# Patient Record
Sex: Male | Born: 1955 | Race: White | Hispanic: No | Marital: Single | Smoking: Current every day smoker
Health system: Southern US, Community
[De-identification: ages and names within clinical notes are randomized; demographics above are authoritative.]

## PROBLEM LIST (undated history)

## (undated) DIAGNOSIS — I10 Essential (primary) hypertension: Secondary | ICD-10-CM

## (undated) DIAGNOSIS — N289 Disorder of kidney and ureter, unspecified: Secondary | ICD-10-CM

## (undated) DIAGNOSIS — N183 Chronic kidney disease, stage 3 unspecified: Secondary | ICD-10-CM

## (undated) DIAGNOSIS — I509 Heart failure, unspecified: Secondary | ICD-10-CM

## (undated) HISTORY — PX: TOE AMPUTATION: SHX809

---

## 2018-08-02 ENCOUNTER — Other Ambulatory Visit: Payer: Self-pay

## 2018-08-02 ENCOUNTER — Inpatient Hospital Stay
Admission: EM | Admit: 2018-08-02 | Discharge: 2018-08-09 | DRG: 464 | Disposition: A | Discharge status: Skilled Nursing Facility | Payer: Medicare HMO | Attending: Internal Medicine | Admitting: Internal Medicine

## 2018-08-02 ENCOUNTER — Emergency Department: Payer: Medicare HMO

## 2018-08-02 ENCOUNTER — Inpatient Hospital Stay: Payer: Medicare HMO

## 2018-08-02 ENCOUNTER — Encounter: Payer: Self-pay | Admitting: Emergency Medicine

## 2018-08-02 DIAGNOSIS — I130 Hypertensive heart and chronic kidney disease with heart failure and stage 1 through stage 4 chronic kidney disease, or unspecified chronic kidney disease: Secondary | ICD-10-CM | POA: Diagnosis present

## 2018-08-02 DIAGNOSIS — I482 Chronic atrial fibrillation, unspecified: Secondary | ICD-10-CM | POA: Diagnosis present

## 2018-08-02 DIAGNOSIS — I5042 Chronic combined systolic (congestive) and diastolic (congestive) heart failure: Secondary | ICD-10-CM | POA: Diagnosis present

## 2018-08-02 DIAGNOSIS — L97529 Non-pressure chronic ulcer of other part of left foot with unspecified severity: Secondary | ICD-10-CM | POA: Diagnosis present

## 2018-08-02 DIAGNOSIS — R339 Retention of urine, unspecified: Secondary | ICD-10-CM | POA: Diagnosis not present

## 2018-08-02 DIAGNOSIS — M869 Osteomyelitis, unspecified: Principal | ICD-10-CM | POA: Diagnosis present

## 2018-08-02 DIAGNOSIS — I959 Hypotension, unspecified: Secondary | ICD-10-CM | POA: Diagnosis not present

## 2018-08-02 DIAGNOSIS — Z1159 Encounter for screening for other viral diseases: Secondary | ICD-10-CM | POA: Diagnosis not present

## 2018-08-02 DIAGNOSIS — R591 Generalized enlarged lymph nodes: Secondary | ICD-10-CM | POA: Diagnosis present

## 2018-08-02 DIAGNOSIS — F17210 Nicotine dependence, cigarettes, uncomplicated: Secondary | ICD-10-CM | POA: Diagnosis present

## 2018-08-02 DIAGNOSIS — N179 Acute kidney failure, unspecified: Secondary | ICD-10-CM | POA: Diagnosis present

## 2018-08-02 DIAGNOSIS — N183 Chronic kidney disease, stage 3 (moderate): Secondary | ICD-10-CM | POA: Diagnosis present

## 2018-08-02 DIAGNOSIS — Z79899 Other long term (current) drug therapy: Secondary | ICD-10-CM

## 2018-08-02 DIAGNOSIS — G629 Polyneuropathy, unspecified: Secondary | ICD-10-CM | POA: Diagnosis present

## 2018-08-02 DIAGNOSIS — Z89412 Acquired absence of left great toe: Secondary | ICD-10-CM | POA: Diagnosis not present

## 2018-08-02 DIAGNOSIS — L089 Local infection of the skin and subcutaneous tissue, unspecified: Secondary | ICD-10-CM | POA: Diagnosis present

## 2018-08-02 HISTORY — DX: Essential (primary) hypertension: I10

## 2018-08-02 HISTORY — DX: Heart failure, unspecified: I50.9

## 2018-08-02 LAB — BASIC METABOLIC PANEL
Anion gap: 7 (ref 5–15)
BUN: 23 mg/dL (ref 8–23)
CO2: 22 mmol/L (ref 22–32)
Calcium: 10.4 mg/dL — ABNORMAL HIGH (ref 8.9–10.3)
Chloride: 106 mmol/L (ref 98–111)
Creatinine, Ser: 1.68 mg/dL — ABNORMAL HIGH (ref 0.61–1.24)
GFR calc Af Amer: 50 mL/min — ABNORMAL LOW (ref >60)
GFR calc non Af Amer: 43 mL/min — ABNORMAL LOW (ref >60)
Glucose, Bld: 108 mg/dL — ABNORMAL HIGH (ref 70–99)
Potassium: 4.6 mmol/L (ref 3.5–5.1)
Sodium: 135 mmol/L (ref 135–145)

## 2018-08-02 LAB — SEDIMENTATION RATE: Sed Rate: 36 mm/hr — ABNORMAL HIGH (ref 0–20)

## 2018-08-02 LAB — CBC WITH DIFFERENTIAL/PLATELET
Abs Immature Granulocytes: 0.04 10*3/uL (ref 0.00–0.07)
Basophils Absolute: 0.1 10*3/uL (ref 0.0–0.1)
Basophils Relative: 1 %
Eosinophils Absolute: 0.3 10*3/uL (ref 0.0–0.5)
Eosinophils Relative: 2 %
HCT: 42.5 % (ref 39.0–52.0)
Hemoglobin: 13.3 g/dL (ref 13.0–17.0)
Immature Granulocytes: 0 %
Lymphocytes Relative: 12 %
Lymphs Abs: 1.4 10*3/uL (ref 0.7–4.0)
MCH: 30 pg (ref 26.0–34.0)
MCHC: 31.3 g/dL (ref 30.0–36.0)
MCV: 95.7 fL (ref 80.0–100.0)
Monocytes Absolute: 1.4 10*3/uL — ABNORMAL HIGH (ref 0.1–1.0)
Monocytes Relative: 12 %
Neutro Abs: 8.7 10*3/uL — ABNORMAL HIGH (ref 1.7–7.7)
Neutrophils Relative %: 73 %
Platelets: 259 10*3/uL (ref 150–400)
RBC: 4.44 MIL/uL (ref 4.22–5.81)
RDW: 13.1 % (ref 11.5–15.5)
WBC: 12 10*3/uL — ABNORMAL HIGH (ref 4.0–10.5)
nRBC: 0 % (ref 0.0–0.2)

## 2018-08-02 LAB — C-REACTIVE PROTEIN: CRP: 6.4 mg/dL — ABNORMAL HIGH (ref <1.0)

## 2018-08-02 LAB — LACTIC ACID, PLASMA: Lactic Acid, Venous: 0.8 mmol/L (ref 0.5–1.9)

## 2018-08-02 MED ORDER — ACETAMINOPHEN 325 MG PO TABS: 650.0000 mg | ORAL_TABLET | Freq: Four times a day (QID) | ORAL | Status: DC | PRN

## 2018-08-02 MED ORDER — DIGOXIN 250 MCG PO TABS: 0.2500 mg | ORAL_TABLET | Freq: Every day | ORAL | Status: DC

## 2018-08-02 MED ORDER — POLYETHYLENE GLYCOL 3350 17 G PO PACK: 17.0000 g | PACK | Freq: Every day | ORAL | Status: DC | PRN

## 2018-08-02 MED ORDER — SODIUM CHLORIDE 0.9% FLUSH
10.0000 mL | Freq: Two times a day (BID) | INTRAVENOUS | Status: DC
  Administered 2018-08-02 – 2018-08-09 (×8): 10 mL

## 2018-08-02 MED ORDER — ACETAMINOPHEN 650 MG RE SUPP: 650.0000 mg | Freq: Four times a day (QID) | RECTAL | Status: DC | PRN

## 2018-08-02 MED ORDER — FUROSEMIDE 40 MG PO TABS
40.0000 mg | ORAL_TABLET | Freq: Every day | ORAL | Status: DC
  Administered 2018-08-03 – 2018-08-09 (×6): 40 mg via ORAL
  Filled 2018-08-02 (×6): qty 1

## 2018-08-02 MED ORDER — GADOBUTROL 1 MMOL/ML IV SOLN: 8.0000 mL | Freq: Once | INTRAVENOUS | Status: DC | PRN

## 2018-08-02 MED ORDER — VANCOMYCIN HCL 10 G IV SOLR
1500.0000 mg | INTRAVENOUS | Status: DC
  Administered 2018-08-03: 05:00:00 1500 mg via INTRAVENOUS
  Filled 2018-08-02: qty 1500

## 2018-08-02 MED ORDER — ASPIRIN 81 MG PO CHEW
81.0000 mg | CHEWABLE_TABLET | Freq: Every day | ORAL | Status: DC
  Administered 2018-08-02 – 2018-08-09 (×7): 81 mg via ORAL
  Filled 2018-08-02 (×7): qty 1

## 2018-08-02 MED ORDER — ONDANSETRON HCL 4 MG/2ML IJ SOLN: 4.0000 mg | Freq: Four times a day (QID) | INTRAMUSCULAR | Status: DC | PRN

## 2018-08-02 MED ORDER — ONDANSETRON HCL 4 MG PO TABS: 4.0000 mg | ORAL_TABLET | Freq: Four times a day (QID) | ORAL | Status: DC | PRN

## 2018-08-02 MED ORDER — VANCOMYCIN HCL IN DEXTROSE 1-5 GM/200ML-% IV SOLN
1000.0000 mg | Freq: Once | INTRAVENOUS | Status: AC
  Administered 2018-08-02: 1000 mg via INTRAVENOUS
  Filled 2018-08-02: qty 200

## 2018-08-02 MED ORDER — GADOBUTROL 1 MMOL/ML IV SOLN
7.5000 mL | Freq: Once | INTRAVENOUS | Status: AC | PRN
  Administered 2018-08-02: 19:00:00 7.5 mL via INTRAVENOUS

## 2018-08-02 MED ORDER — METOPROLOL SUCCINATE ER 50 MG PO TB24
100.0000 mg | ORAL_TABLET | Freq: Every day | ORAL | Status: DC
  Administered 2018-08-02 – 2018-08-04 (×3): 100 mg via ORAL
  Filled 2018-08-02 (×3): qty 2

## 2018-08-02 MED ORDER — SODIUM CHLORIDE 0.9 % IV SOLN
2.0000 g | Freq: Two times a day (BID) | INTRAVENOUS | Status: DC
  Administered 2018-08-03: 04:00:00 2 g via INTRAVENOUS
  Filled 2018-08-02 (×2): qty 2

## 2018-08-02 MED ORDER — NICOTINE 14 MG/24HR TD PT24
14.0000 mg | MEDICATED_PATCH | Freq: Every day | TRANSDERMAL | Status: DC
  Filled 2018-08-02 (×4): qty 1

## 2018-08-02 MED ORDER — LISINOPRIL 5 MG PO TABS
2.5000 mg | ORAL_TABLET | Freq: Every day | ORAL | Status: DC
  Administered 2018-08-03 – 2018-08-09 (×6): 2.5 mg via ORAL
  Filled 2018-08-02 (×6): qty 1

## 2018-08-02 MED ORDER — SODIUM CHLORIDE 0.9 % IV SOLN
2.0000 g | Freq: Once | INTRAVENOUS | Status: AC
  Administered 2018-08-02: 2 g via INTRAVENOUS
  Filled 2018-08-02: qty 2

## 2018-08-02 MED ORDER — SODIUM CHLORIDE 0.9% FLUSH: 10.0000 mL | INTRAVENOUS | Status: DC | PRN

## 2018-08-02 MED ORDER — DIGOXIN 125 MCG PO TABS
0.1250 mg | ORAL_TABLET | Freq: Every day | ORAL | Status: DC
  Administered 2018-08-02 – 2018-08-04 (×3): 0.125 mg via ORAL
  Filled 2018-08-02 (×8): qty 1

## 2018-08-02 MED ORDER — VANCOMYCIN HCL IN DEXTROSE 1-5 GM/200ML-% IV SOLN
1000.0000 mg | Freq: Once | INTRAVENOUS | Status: AC
  Administered 2018-08-02: 16:00:00 1000 mg via INTRAVENOUS
  Filled 2018-08-02: qty 200

## 2018-08-02 NOTE — Consult Note (Signed)
Reason for Consult: Infected bone left foot Referring Physician: Yomar Clark is an 63 y.o. male.  HPI: This is a 63 year old male just recently moved to this area from [place] to a group home.  States he had to move out of his old home due to being infested with bugs.  States he has had some chronic problems with sores on his left foot over the last couple of years with amputation of the big toe last year.  Has had some increasing redness and swelling and drainage from a chronic sore on his left foot and was referred to the emergency department earlier today.  Admitted for definitive treatment.  Past Medical History:  Diagnosis Date  . CHF (congestive heart failure) ([place])   . Hypertension     Past Surgical History:  Procedure Laterality Date  . TOE AMPUTATION      History reviewed. No pertinent family history.  Social History:  reports that he has been smoking. He has never used smokeless tobacco. He reports that he does not drink alcohol or use drugs.  Allergies: No Known Allergies  Medications:  Scheduled: . aspirin  81 mg Oral Daily  . digoxin  0.25 mg Oral Daily  . furosemide  40 mg Oral Daily  . lisinopril  2.5 mg Oral Daily  . metoprolol succinate  100 mg Oral Daily  . [START ON 08/03/2018] nicotine  14 mg Transdermal Daily  . sodium chloride flush  10-40 mL Intracatheter Q12H    Results for orders placed or performed during the hospital encounter of 08/02/18 (from the past 48 hour(s))  Basic metabolic panel     Status: Abnormal   Collection Time: 08/02/18  9:31 AM  Result Value Ref Range   Sodium 135 135 - 145 mmol/L   Potassium 4.6 3.5 - 5.1 mmol/L   Chloride 106 98 - 111 mmol/L   CO2 22 22 - 32 mmol/L   Glucose, Bld 108 (H) 70 - 99 mg/dL   BUN 23 8 - 23 mg/dL   Creatinine, Ser 1.68 (H) 0.61 - 1.24 mg/dL   Calcium 10.4 (H) 8.9 - 10.3 mg/dL   GFR calc non Af Amer 43 (L) >60 mL/min   GFR calc Af Amer 50 (L) >60 mL/min   Anion gap 7 5 - 15    Comment:  Performed at [facility], [address] [place], [place] [address]  CBC with Differential     Status: Abnormal   Collection Time: 08/02/18  9:31 AM  Result Value Ref Range   WBC 12.0 (H) 4.0 - 10.5 K/uL    Comment: WHITE COUNT CONFIRMED ON SMEAR   RBC 4.44 4.22 - 5.81 MIL/uL   Hemoglobin 13.3 13.0 - 17.0 g/dL   HCT 42.5 39.0 - 52.0 %   MCV 95.7 80.0 - 100.0 fL   MCH 30.0 26.0 - 34.0 pg   MCHC 31.3 30.0 - 36.0 g/dL   RDW 13.1 11.5 - 15.5 %   Platelets 259 150 - 400 K/uL    Comment: PLATELET CLUMPS NOTED ON SMEAR, UNABLE TO ESTIMATE   nRBC 0.0 0.0 - 0.2 %   Neutrophils Relative % 73 %   Neutro Abs 8.7 (H) 1.7 - 7.7 K/uL   Lymphocytes Relative 12 %   Lymphs Abs 1.4 0.7 - 4.0 K/uL   Monocytes Relative 12 %   Monocytes Absolute 1.4 (H) 0.1 - 1.0 K/uL   Eosinophils Relative 2 %   Eosinophils Absolute 0.3 0.0 - 0.5  K/uL   Basophils Relative 1 %   Basophils Absolute 0.1 0.0 - 0.1 K/uL   Immature Granulocytes 0 %   Abs Immature Granulocytes 0.04 0.00 - 0.07 K/uL    Comment: Performed at [facility], [address] [place], [place] [address]  Lactic acid, plasma     Status: None   Collection Time: 08/02/18  9:31 AM  Result Value Ref Range   Lactic Acid, Venous 0.8 0.5 - 1.9 mmol/L    Comment: Performed at [facility], [address] [place], [place] [address]  Sedimentation rate     Status: Abnormal   Collection Time: 08/02/18  9:31 AM  Result Value Ref Range   Sed Rate 36 (H) 0 - 20 mm/hr    Comment: Performed at [facility], [address] [place], [place] [address]    Dg Foot 2 Views Left  Result Date: 08/02/2018 CLINICAL DATA:  Left toe pain.  Amputation 4 weeks ago. EXAM: LEFT FOOT - 2 VIEW COMPARISON:  None. FINDINGS: To the first metatarsal was amputated. There is dislocation of the second MTP joint laterally. There is diffuse cortical thickening and periosteal reaction along the medial aspect of the second metatarsal with  erosive changes distally. The third MTP joint demonstrates subluxation laterally as well. There is callus formation about the third metatarsal compatible with remote fracture. Diffuse soft tissue swelling is present about the ankle and foot. IMPRESSION: 1. Lateral dislocation of the second MTP joint. 2. Diffuse periosteal reaction along the medial aspect of the second metatarsal with erosive changes distally. This is concerning for osteomyelitis. 3. Extensive soft tissue swelling.  Infection is not excluded. 4. Remote fracture of the third metatarsal. Electronically Signed   By: San Morelle M.D.   On: 08/02/2018 09:56    Review of Systems  Constitutional: Negative for chills and fever.  HENT: Negative for hearing loss and sinus pain.   Eyes: Negative for blurred vision and double vision.  Respiratory: Negative for cough and shortness of breath.   Cardiovascular: Negative for chest pain and palpitations.  Gastrointestinal: Negative for nausea and vomiting.  Genitourinary: Negative for dysuria and urgency.  Musculoskeletal: Negative for back pain and myalgias.  Skin:       Patient relates some recently increasing drainage from a chronic wound on his left foot.  Some increased swelling and redness in the left foot and leg  Neurological:       Patient relates numbness in both of his lower extremities.  Endo/Heme/Allergies: Does not bruise/bleed easily.  Psychiatric/Behavioral: Negative for depression and suicidal ideas.   Blood pressure 120/78, pulse (!) 103, temperature 98.8 F (37.1 C), temperature source Oral, resp. rate 16, height 6\' 1"  (1.854 m), weight 84.4 kg, SpO2 99 %. Physical Exam  Cardiovascular:  DP and PT pulses are fully palpable bilateral.  Musculoskeletal:     Comments: Previous amputation of the left great toe with ray resection.  Otherwise appears to be relatively normal range of motion in the pedal joints.  Some dorsal contracture of the digits on the left foot.   Neurological:  Complete loss of protective threshold with a monofilament wire bilaterally in feet and toes.  Proprioception impaired as well.  Skin:  The skin is warm dry and supple.  Some significant edema in the left lower extremity with some focal erythema in the forefoot medial.  A full-thickness ulceration with exposed bone is present beneath the second metatarsal measuring approximately 2.2 cm x 1.5 cm.  All of the remaining toenails are thick, dystrophic, discolored, brittle with some subungual debris.    Assessment/Plan: Assessment: Osteomyelitis left second metatarsal.  Neuropathy.  Plan: Discussed with the patient that the bone does appear to be infected in the left foot.  Discussed that we will need to remove the infected portion of bone.  This also may require amputating the second toe but I will review his x-rays again as for incision placement we may be able to leave the second toe intact as it is previously dislocated and hopefully not really infected.  Discussed risks and complications of the procedure and anesthesia including inability to heal due to the infection which may require further surgery.  Questions invited and answered.  Obtain a consent for debridement infected bone left foot with possible second toe amputation.  N.p.o. after midnight.  We will plan for surgery tomorrow around lunchtime.  Durward Fortes 08/02/2018, 5:36 PM

## 2018-08-02 NOTE — ED Notes (Addendum)
ED TO INPATIENT HANDOFF REPORT  ED Nurse Name and Phone #:  Gershon Mussel RN    [phone]  S Name/Age/Gender Ruben Clark 63 y.o. male Room/Bed: ED16A/ED16A  Code Status   Code Status: Not on file  Home/SNF/Other [facility] Patient oriented to: self, place, time and situation Is this baseline? Yes   Triage Complete: Triage complete  Chief Complaint L foot pain   Triage Note Pt from [facility] via EMS with c/o LFT toe pain. PT had amputation aprox 4wks ago. Pt also states he has been out of his meds. MD at bedside.    Allergies No Known Allergies  Level of Care/Admitting Diagnosis ED Disposition    ED Disposition Condition [facility] Area: [place] [[id number]]  Level of Care: Med-Surg [16]  Covid Evaluation: N/A  Diagnosis: Left foot infection [[id number]]  Admitting Physician: Hyman Bible DODD [[id number]]  Attending Physician: Hyman Bible DODD [[id number]]  Estimated length of stay: past midnight tomorrow  Certification:: I certify this patient will need inpatient services for at least 2 midnights  PT Class (Do Not Modify): Inpatient [101]  PT Acc Code (Do Not Modify): Private [1]       B Medical/Surgery History Past Medical History:  Diagnosis Date  . CHF (congestive heart failure) ([place])   . Hypertension    Past Surgical History:  Procedure Laterality Date  . TOE AMPUTATION       A IV Location/Drains/Wounds Patient Lines/Drains/Airways Status   Active Line/Drains/Airways    Name:   Placement date:   Placement time:   Site:   Days:   Midline Single Lumen 08/02/18 Midline Left Brachial 8 cm 0 cm   08/02/18    1254    Brachial   less than 1          Intake/Output Last 24 hours No intake or output data in the 24 hours ending 08/02/18 1357  Labs/Imaging Results for orders placed or performed during the hospital encounter of 08/02/18 (from the past 48 hour(s))  Basic metabolic panel     Status: Abnormal   Collection Time: 08/02/18  9:31 AM  Result Value Ref Range   Sodium 135 135 - 145 mmol/L   Potassium 4.6 3.5 - 5.1 mmol/L   Chloride 106 98 - 111 mmol/L   CO2 22 22 - 32 mmol/L   Glucose, Bld 108 (H) 70 - 99 mg/dL   BUN 23 8 - 23 mg/dL   Creatinine, Ser 1.68 (H) 0.61 - 1.24 mg/dL   Calcium 10.4 (H) 8.9 - 10.3 mg/dL   GFR calc non Af Amer 43 (L) >60 mL/min   GFR calc Af Amer 50 (L) >60 mL/min   Anion gap 7 5 - 15    Comment: Performed at [facility], [address] [place], [place] [address]  CBC with Differential     Status: Abnormal   Collection Time: 08/02/18  9:31 AM  Result Value Ref Range   WBC 12.0 (H) 4.0 - 10.5 K/uL    Comment: WHITE COUNT CONFIRMED ON SMEAR   RBC 4.44 4.22 - 5.81 MIL/uL   Hemoglobin 13.3 13.0 - 17.0 g/dL   HCT 42.5 39.0 - 52.0 %   MCV 95.7 80.0 - 100.0 fL   MCH 30.0 26.0 - 34.0 pg   MCHC 31.3 30.0 - 36.0 g/dL   RDW 13.1 11.5 - 15.5 %   Platelets 259 150 - 400 K/uL  Comment: PLATELET CLUMPS NOTED ON SMEAR, UNABLE TO ESTIMATE   nRBC 0.0 0.0 - 0.2 %   Neutrophils Relative % 73 %   Neutro Abs 8.7 (H) 1.7 - 7.7 K/uL   Lymphocytes Relative 12 %   Lymphs Abs 1.4 0.7 - 4.0 K/uL   Monocytes Relative 12 %   Monocytes Absolute 1.4 (H) 0.1 - 1.0 K/uL   Eosinophils Relative 2 %   Eosinophils Absolute 0.3 0.0 - 0.5 K/uL   Basophils Relative 1 %   Basophils Absolute 0.1 0.0 - 0.1 K/uL   Immature Granulocytes 0 %   Abs Immature Granulocytes 0.04 0.00 - 0.07 K/uL    Comment: Performed at [facility], [address] [place], [place] [address]  Lactic acid, plasma     Status: None   Collection Time: 08/02/18  9:31 AM  Result Value Ref Range   Lactic Acid, Venous 0.8 0.5 - 1.9 mmol/L    Comment: Performed at [facility], [address] [place], [place] [address]  Sedimentation rate     Status: Abnormal   Collection Time: 08/02/18  9:31 AM  Result Value Ref Range   Sed Rate 36 (H) 0 - 20 mm/hr    Comment: Performed at  [facility], [address], [place], [place] [address]   Dg Foot 2 Views Left  Result Date: 08/02/2018 CLINICAL DATA:  Left toe pain.  Amputation 4 weeks ago. EXAM: LEFT FOOT - 2 VIEW COMPARISON:  None. FINDINGS: To the first metatarsal was amputated. There is dislocation of the second MTP joint laterally. There is diffuse cortical thickening and periosteal reaction along the medial aspect of the second metatarsal with erosive changes distally. The third MTP joint demonstrates subluxation laterally as well. There is callus formation about the third metatarsal compatible with remote fracture. Diffuse soft tissue swelling is present about the ankle and foot. IMPRESSION: 1. Lateral dislocation of the second MTP joint. 2. Diffuse periosteal reaction along the medial aspect of the second metatarsal with erosive changes distally. This is concerning for osteomyelitis. 3. Extensive soft tissue swelling.  Infection is not excluded. 4. Remote fracture of the third metatarsal. Electronically Signed   By: San Morelle M.D.   On: 08/02/2018 09:56    Pending Labs Unresulted Labs (From admission, onward)    Start     Ordered   08/02/18 0931  Culture, blood (routine x 2)  BLOOD CULTURE X 2,   STAT     08/02/18 0930   08/02/18 0931  Lactic acid, plasma  Now then every 2 hours,   STAT     08/02/18 0930   08/02/18 0931  C-reactive protein  Once,   STAT     08/02/18 0930   Signed and Held  HIV antibody (Routine Testing)  Once,   R     Signed and Held   Signed and Held  Basic metabolic panel  Tomorrow morning,   R     Signed and Held   Signed and Held  CBC  Tomorrow morning,   R     Signed and Held          Vitals/Pain Today's Vitals   08/02/18 0927 08/02/18 0928 08/02/18 1000 08/02/18 1151  BP:  137/86 126/85 114/80  Pulse: 79   (!) 58  Resp: 16   18  Temp:  98.4 F (36.9 C)  98.2 F (36.8 C)  TempSrc: Oral Oral  Oral  SpO2: 96%   100%  PainSc:  0-No pain    Isolation  Precautions No active isolations  Medications Medications  vancomycin (VANCOCIN) IVPB 1000 mg/200 mL premix (has no administration in time range)  sodium chloride flush (NS) 0.9 % injection 10-40 mL (has no administration in time range)  sodium chloride flush (NS) 0.9 % injection 10-40 mL (has no administration in time range)  ceFEPIme (MAXIPIME) 2 g in sodium chloride 0.9 % 100 mL IVPB (2 g Intravenous New Bag/Given 08/02/18 1049)    Mobility walks with device Low fall risk   Focused Assessments Cardiac Assessment Handoff:    No results found for: CKTOTAL, CKMB, CKMBINDEX, TROPONINI No results found for: DDIMER Does the Patient currently have chest pain? No      R Recommendations: See Admitting Provider Note  Report given to:  Jessica RN on 1C  Additional Notes:

## 2018-08-02 NOTE — ED Notes (Signed)
Pt has legal guardian [name] [phone] ,  Unable to successfully contact at this time

## 2018-08-02 NOTE — TOC Progression Note (Signed)
Transition of Care [facility]) - Progression Note    Patient Details  Name: Quaron Delacruz MRN: [id number] Date of Birth: 1955/07/04  Transition of Care [facility]) CM/SW Contact  Su Hilt, RN Phone Number: 08/02/2018, 3:20 PM  Clinical Narrative:     Josefa Half legal guardian At [phone] Left a VM for a call back       Expected Discharge Plan and Services                                                 Social Determinants of Health (SDOH) Interventions    Readmission Risk Interventions No flowsheet data found.

## 2018-08-02 NOTE — ED Triage Notes (Signed)
Pt from [facility] group home via EMS with c/o LFT toe pain. PT had amputation aprox 4wks ago. Pt also states he has been out of his meds. MD at bedside.

## 2018-08-02 NOTE — H&P (Addendum)
[facility] [place] NAME: Ruben Clark    MR#:  [id number]  DATE OF BIRTH:  Apr 16, 1955  DATE OF ADMISSION:  08/02/2018  PRIMARY CARE PHYSICIAN: System, Pcp Not In   REQUESTING/REFERRING PHYSICIAN: Arta Silence, MD  CHIEF COMPLAINT:  Drainage from left foot wound   HISTORY OF PRESENT ILLNESS:  Ruben Clark  is a 63 y.o. male with a known history of chronic combined systolic and diastolic CHF, hypertension, chronic atrial fibrillation, hyperlipidemia, COPD, tobacco use who was sent to the ED from his group home with increasing drainage from a chronic left foot wound.  Patient just recently moved to the area about 1 week ago.  He states he is unsure how long his wound has been there, probably a couple of weeks.  He has had some increased drainage over the last 3 to 4 days, and has noticed that his sock keeps being soaked.  He denies any pain in his left foot.  He denies any fevers or chills.  In the ED, vitals were unremarkable.  Labs were significant for WBC 12, creatinine 1.68.  Left foot x-ray with diffuse periosteal reaction along the medial aspect of the second metatarsal, concerning for osteomyelitis.  Patient was started on empiric antibiotics and hospitalists were called for admission.  PAST MEDICAL HISTORY:   Past Medical History:  Diagnosis Date  . CHF (congestive heart failure) ([place])   . Hypertension     PAST SURGICAL HISTORY:   Past Surgical History:  Procedure Laterality Date  . TOE AMPUTATION      SOCIAL HISTORY:   Social History   Tobacco Use  . Smoking status: Current Every Day Smoker  . Smokeless tobacco: Never Used  Substance Use Topics  . Alcohol use: Never    Frequency: Never    FAMILY HISTORY:  Father-heart disease  DRUG ALLERGIES:  No Known Allergies  REVIEW OF SYSTEMS:   Review of Systems  Constitutional: Negative for chills and fever.  HENT: Negative for congestion and sore throat.   Eyes:  Negative for blurred vision and double vision.  Respiratory: Negative for cough and shortness of breath.   Cardiovascular: Negative for chest pain and palpitations.  Gastrointestinal: Negative for nausea and vomiting.  Genitourinary: Negative for dysuria and urgency.  Musculoskeletal: Negative for back pain, falls and neck pain.  Neurological: Negative for dizziness and headaches.  Psychiatric/Behavioral: Negative for depression. The patient is not nervous/anxious.     MEDICATIONS AT HOME:   Prior to Admission medications   Medication Sig Start Date End Date Taking? Authorizing Provider  gabapentin (NEURONTIN) 300 MG capsule Take 300 mg by mouth at bedtime.   Yes [provider]  LORazepam (ATIVAN) 0.5 MG tablet Take 0.25 mg by mouth daily.   Yes [provider]      VITAL SIGNS:  Blood pressure 114/80, pulse (!) 58, temperature 98.2 F (36.8 C), temperature source Oral, resp. rate 18, SpO2 100 %.  PHYSICAL EXAMINATION:  Physical Exam  GENERAL:  63 y.o.-year-old patient lying in the bed with no acute distress.  EYES: Pupils equal, round, reactive to light and accommodation. No scleral icterus. Extraocular muscles intact.  HEENT: Head atraumatic, normocephalic. Oropharynx and nasopharynx clear.  NECK:  Supple, no jugular venous distention. No thyroid enlargement, no tenderness.  LUNGS: Normal breath sounds bilaterally, no wheezing, rales,rhonchi or crepitation. No use of accessory muscles of respiration.  CARDIOVASCULAR: Irregularly irregular rhythm, regular rate, S1, S2 normal. No murmurs, rubs,  or gallops.  ABDOMEN: Soft, nontender, nondistended. Bowel sounds present. No organomegaly or mass.  EXTREMITIES: No pedal edema, cyanosis, or clubbing. +left great toe amputation NEUROLOGIC: Cranial nerves II through XII are intact. Muscle strength 5/5 in all extremities. Sensation intact. Gait not checked.  PSYCHIATRIC: The patient is alert and oriented x 3.  SKIN: No  obvious rash.  Wound present on left foot.  See picture below     LABORATORY PANEL:   CBC Recent Labs  Lab 08/02/18 0931  WBC 12.0*  HGB 13.3  HCT 42.5  PLT 259   ------------------------------------------------------------------------------------------------------------------  Chemistries  Recent Labs  Lab 08/02/18 0931  NA 135  K 4.6  CL 106  CO2 22  GLUCOSE 108*  BUN 23  CREATININE 1.68*  CALCIUM 10.4*   ------------------------------------------------------------------------------------------------------------------  Cardiac Enzymes No results for input(s): TROPONINI in the last 168 hours. ------------------------------------------------------------------------------------------------------------------  RADIOLOGY:  Dg Foot 2 Views Left  Result Date: 08/02/2018 CLINICAL DATA:  Left toe pain.  Amputation 4 weeks ago. EXAM: LEFT FOOT - 2 VIEW COMPARISON:  None. FINDINGS: To the first metatarsal was amputated. There is dislocation of the second MTP joint laterally. There is diffuse cortical thickening and periosteal reaction along the medial aspect of the second metatarsal with erosive changes distally. The third MTP joint demonstrates subluxation laterally as well. There is callus formation about the third metatarsal compatible with remote fracture. Diffuse soft tissue swelling is present about the ankle and foot. IMPRESSION: 1. Lateral dislocation of the second MTP joint. 2. Diffuse periosteal reaction along the medial aspect of the second metatarsal with erosive changes distally. This is concerning for osteomyelitis. 3. Extensive soft tissue swelling.  Infection is not excluded. 4. Remote fracture of the third metatarsal. Electronically Signed   By: San Morelle M.D.   On: 08/02/2018 09:56      IMPRESSION AND PLAN:   Infected left foot ulcer- x-ray with concern for osteomyelitis.  Not meeting sepsis criteria on admission. -Left foot MRI -Podiatry consult  -Continue vancomycin and cefepime -Blood cultures pending  Chronic combined systolic and diastolic congestive heart failure- patient does not appear volume overloaded. Last ECHO 10/19 with EF 25%. Not on any anticoagulation, unclear why this is. -Continue home aspirin, lasix, lisinopril, metoprolol, digoxin -Needs to establish care with an outpatient cardiologist  Chronic atrial fibrillation- rate-controlled -Continue home metoprolol and digoxin  Hypertension- BPs normal in the ED -Continue home meds  CKD stage III- creatinine at baseline. -Avoid nephrotoxic agents -Monitor  Tobacco use -Nicotine patch prn  Patient is a ward of the state and has a legal guardianSolmon Ice [phone]. Unclear why is he a ward of the state. Unable to get in touch with legal guardian over the phone.  All the records are reviewed and case discussed with ED provider. Management plans discussed with the patient, family and they are in agreement.  CODE STATUS: Full  TOTAL TIME TAKING CARE OF THIS PATIENT: 45 minutes.    Berna Spare  M.D on 08/02/2018 at 12:56 PM  Between 7am to 6pm - Pager - [phone]  After 6pm go to [email or web address] - Proofreader  [facility]  Hospitalists  Office  [phone]  CC: Primary care physician; System, Pcp Not In   Note: This dictation was prepared with Dragon dictation along with smaller phrase technology. Any transcriptional errors that result from this process are unintentional.

## 2018-08-02 NOTE — TOC Initial Note (Signed)
Transition of Care [facility]) - Initial/Assessment Note    Patient Details  Name: Ruben Clark MRN: [id number] Date of Birth: March 12, 1956  [facility]) CM/SW Contact:    Ruben Hilt, RN Phone Number: 08/02/2018, 3:28 PM  Clinical Narrative:                 Meth with the patient to discuss DC plan and needs He states that he lives at [name], they provide all transportation for him He goes to the Health department in [place] for medcal needs Dr. Esmond Clark takes care of foot He states that he is not diabetic He has a  legal guardian Ruben Clark Is not using a current Pharmacy and is open to any  He states that he does not have a current need CM to continue to follow for DC needs  Expected Discharge Plan: Group Home(A vision True) Barriers to Discharge: Continued Medical Work up   Patient Goals and CMS Choice Patient states their goals for this hospitalization and ongoing recovery are:: get out of here and get foot fixed      Expected Discharge Plan and Services Expected Discharge Plan: Group Home(A vision True)   Discharge Planning Services: CM Consult   Living arrangements for the past 2 months: Group Home                 DME Arranged: (none needed)                    Prior Living Arrangements/Services Living arrangements for the past 2 months: Group Home   Patient language and need for interpreter reviewed:: No        Need for Family Participation in Patient Care: No (Comment) Care giver support system in place?: Yes (comment) Current home services: DME(walker) Criminal Activity/Legal Involvement Pertinent to Current Situation/Hospitalization: No - Comment as needed  Activities of Daily Living Home Assistive Devices/Equipment: Cane (specify quad or straight), Shower chair with back, Walker (specify type) ADL Screening (condition at time of admission) Patient's cognitive ability adequate to safely complete daily activities?:  Yes Is the patient deaf or have difficulty hearing?: No Does the patient have difficulty seeing, even when wearing glasses/contacts?: No Does the patient have difficulty concentrating, remembering, or making decisions?: No Patient able to express need for assistance with ADLs?: Yes Does the patient have difficulty dressing or bathing?: No Independently performs ADLs?: Yes (appropriate for developmental age) Does the patient have difficulty walking or climbing stairs?: No Weakness of Legs: None Weakness of Arms/Hands: None  Permission Sought/Granted                  Emotional Assessment Appearance:: Appears stated age Attitude/Demeanor/Rapport: Engaged Affect (typically observed): Accepting Orientation: : Oriented to Self, Oriented to Place, Oriented to  Time, Oriented to Situation Alcohol / Substance Use: Tobacco Use Psych Involvement: No (comment)  Admission diagnosis:  Osteomyelitis of left foot, unspecified type [facility]) [M86.9] Patient Active Problem List   Diagnosis Date Noted  . Left foot infection 08/02/2018   PCP:  System, Pcp Not In Pharmacy:  No Pharmacies Listed    Social Determinants of Health (SDOH) Interventions    Readmission Risk Interventions No flowsheet data found.

## 2018-08-02 NOTE — Progress Notes (Signed)
Pharmacy Antibiotic Note  Ruben Clark is a 63 y.o. male admitted on 08/02/2018 with wound infection/ osteomyelitis.  Pharmacy has been consulted for Vancomycin and Cefepime dosing. 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 Patient Initially, he did not receive a completed dose of Cefepime in the ED d/t IV access issues/infiltration, but another dose was administered in the ED around 1500-per Barbette Merino, RN. Therefore, he did not receive Vancomycin 1000 mg in the ED-essentially no antibiotics were given, per Barbette Merino, RN, when the medications were first ordered. 1000 mg Vancomycin was given around 1500 as well.   Verbal given by Kerby Less on his weight (lbs): 186.5 lbs.  Plan: Will start Cefepime 2g Q12H and monitor Scr with AM labs.   Remaining loading dose of 1000 mg to be given once, then start maintenance dose of 1500 mg Q24H.      AUC Goal: 400 - 550     Expected AUC 535.0     Expected Css Min: 13.1    Temp (24hrs), Avg:98.3 F (36.8 C), Min:98.2 F (36.8 C), Max:98.4 F (36.9 C)  Recent Labs  Lab 08/02/18 0931  WBC 12.0*  CREATININE 1.68*  LATICACIDVEN 0.8    CrCl cannot be calculated (Unknown ideal weight.).   Wt (kg): 84.8 kg Scr (mg/dL): 1.68 (08/02/2018)     Baseline Scr 1.6 mg/dL (02/06/2018)     CrCl 80mL/min  No Known Allergies  Antimicrobials this admission: 04/27 Cefepime >>  04/27  Vancomycin >>   Dose adjustments this admission: N/A  Microbiology results: 4/27 BCx: pending   Thank you for allowing pharmacy to be a part of this patient's care.  Landon Truax R Charvis Lightner,PharmD 08/02/2018 1:25 PM

## 2018-08-02 NOTE — ED Provider Notes (Signed)
[facility] Emergency Department Provider Note ____________________________________________   First MD Initiated Contact with Patient 08/02/18 [phone]     (approximate)  I have reviewed the triage vital signs and the nursing notes.   HISTORY  Chief Complaint No chief complaint on file.    HPI Cung Masterson is a 63 y.o. male with PMH as noted below who presents with a wound to his left foot, chronic for several months, but recently with increased yellow drainage and a bad odor.  The patient states that he had the toe amputated at [facility] about 2 months ago and then went to rehab for 1 month.  He subsequently went to a group home in our area and was sent to the ED today because of the drainage and odor.  The patient denies any acute pain.  He denies any new injuries.  He has no fever or chills, cough, worsening swelling, weakness or numbness, or other acute symptoms.  Past Medical History:  Diagnosis Date  . CHF (congestive heart failure) ([place])   . Hypertension     There are no active problems to display for this patient.   Past Surgical History:  Procedure Laterality Date  . TOE AMPUTATION      Prior to Admission medications   Medication Sig Start Date End Date Taking? Authorizing Provider  gabapentin (NEURONTIN) 300 MG capsule Take 300 mg by mouth at bedtime.   Yes [provider]  LORazepam (ATIVAN) 0.5 MG tablet Take 0.25 mg by mouth daily.   Yes [provider]    Allergies Patient has no known allergies.  No family history on file.  Social History Social History   Tobacco Use  . Smoking status: Current Every Day Smoker  . Smokeless tobacco: Never Used  Substance Use Topics  . Alcohol use: Never    Frequency: Never  . Drug use: Never    Review of Systems  Constitutional: No fever. Eyes: No redness. ENT: No sore throat. Cardiovascular: Denies chest pain. Respiratory: Denies shortness of breath.  Gastrointestinal: No vomiting. Genitourinary: Negative for dysuria.  Musculoskeletal: Negative for back pain.  Positive for left foot wound. Skin: Negative for rash. Neurological: Negative for headache.   ____________________________________________   PHYSICAL EXAM:  VITAL SIGNS: ED Triage Vitals  Enc Vitals Group     BP 08/02/18 0928 137/86     Pulse Rate 08/02/18 0927 79     Resp 08/02/18 0927 16     Temp 08/02/18 0928 98.4 F (36.9 C)     Temp Source 08/02/18 0927 Oral     SpO2 08/02/18 0927 96 %     Weight --      Height --      Head Circumference --      Peak Flow --      Pain Score --      Pain Loc --      Pain Edu? --      Excl. in [place]? --     Constitutional: Alert and oriented. Well appearing and in no acute distress. Eyes: Conjunctivae are normal.  Head: Atraumatic. Nose: No congestion/rhinnorhea. Mouth/Throat: Mucous membranes are moist.   Neck: Normal range of motion.  Cardiovascular: Normal rate, regular rhythm.  Good peripheral circulation. Respiratory: Normal respiratory effort.   Gastrointestinal: No distention.  Musculoskeletal: No lower extremity edema.  Extremities warm and well perfused.  2+ DP pulses bilaterally.  Left great toe amputated.  Approximately 3 cm deep ulcer to the base  of the left great toe with possible bone visible.  Malodorous with clear/yellow drainage.  No surrounding erythema or induration. Neurologic:  Normal speech and language. No gross focal neurologic deficits are appreciated.  Skin:  Skin is warm and dry. No rash noted. Psychiatric: Mood and affect are normal. Speech and behavior are normal.  ____________________________________________   LABS (all labs ordered are listed, but only abnormal results are displayed)  Labs Reviewed  BASIC METABOLIC PANEL - Abnormal; Notable for the following components:      Result Value   Glucose, Bld 108 (*)    Creatinine, Ser 1.68 (*)    Calcium 10.4 (*)    GFR calc non Af Amer 43  (*)    GFR calc Af Amer 50 (*)    All other components within normal limits  CBC WITH DIFFERENTIAL/PLATELET - Abnormal; Notable for the following components:   WBC 12.0 (*)    Neutro Abs 8.7 (*)    Monocytes Absolute 1.4 (*)    All other components within normal limits  SEDIMENTATION RATE - Abnormal; Notable for the following components:   Sed Rate 36 (*)    All other components within normal limits  CULTURE, BLOOD (ROUTINE X 2)  CULTURE, BLOOD (ROUTINE X 2)  LACTIC ACID, PLASMA  LACTIC ACID, PLASMA  C-REACTIVE PROTEIN   ____________________________________________  EKG   ____________________________________________  RADIOLOGY  XR L foot: Findings concerning for osteomyelitis at the second metatarsal  ____________________________________________   PROCEDURES  Procedure(s) performed: No  Procedures  Critical Care performed: No ____________________________________________   INITIAL IMPRESSION / ASSESSMENT AND PLAN / ED COURSE  Pertinent labs & imaging results that were available during my care of the patient were reviewed by me and considered in my medical decision making (see chart for details).  63 year old male with PMH as noted above presents with worsening drainage and odor from an ulcer on the left foot where a toes amputated a few months ago.  The patient is somewhat a poor historian and is unsure of the exact date when he had the toe amputated or his course since then.  I attempted to review past medical records in epic and care everywhere but I do not see any records for [facility].    On exam the patient is overall well-appearing and his vital signs are normal.  He has intact distal pulses to bilateral feet.  The left great toe has been amputated and he has a relatively large ulcer at what was the base of the toe with malodorous drainage although no surrounding erythema or other signs of acute infection.  I am  concerned for chronic nonhealing wound, possible osteomyelitis, or less likely cellulitis.  We will obtain x-ray and lab work-up.  Patient will likely require surgical consult.  ----------------------------------------- 12:52 PM on 08/02/2018 -----------------------------------------  X-ray and lab work-up showed findings concerning for osteomyelitis, although there is no evidence of sepsis.  I consulted Dr. Caryl Comes from podiatry who agrees to evaluate the patient for possible surgical debridement.  We contacted the legal guardian who confirmed that the patient has had recurrent problems with infection of this foot and was previously admitted at [facility].  I ordered empiric IV antibiotics for osteomyelitis.  I admitted the patient and signed him out to the hospitalist Dr. Brett Albino. ____________________________________________   FINAL CLINICAL IMPRESSION(S) / ED DIAGNOSES  Final diagnoses:  Osteomyelitis of left foot, unspecified type ([place])      NEW MEDICATIONS STARTED DURING THIS VISIT:  New Prescriptions   No medications on file     Note:  This document was prepared using Dragon voice recognition software and may include unintentional dictation errors. Martyn Ehrich, MD 08/02/18 1253

## 2018-08-03 ENCOUNTER — Inpatient Hospital Stay: Payer: Medicare HMO | Admitting: Anesthesiology

## 2018-08-03 ENCOUNTER — Encounter
Admission: EM | Disposition: A | Discharge status: Skilled Nursing Facility | Payer: Self-pay | Source: Home / Self Care | Attending: Internal Medicine

## 2018-08-03 HISTORY — PX: AMPUTATION: SHX166

## 2018-08-03 LAB — CBC
HCT: 38.5 % — ABNORMAL LOW (ref 39.0–52.0)
Hemoglobin: 12.2 g/dL — ABNORMAL LOW (ref 13.0–17.0)
MCH: 29.6 pg (ref 26.0–34.0)
MCHC: 31.7 g/dL (ref 30.0–36.0)
MCV: 93.4 fL (ref 80.0–100.0)
Platelets: 304 10*3/uL (ref 150–400)
RBC: 4.12 MIL/uL — ABNORMAL LOW (ref 4.22–5.81)
RDW: 13.1 % (ref 11.5–15.5)
WBC: 10.6 10*3/uL — ABNORMAL HIGH (ref 4.0–10.5)
nRBC: 0 % (ref 0.0–0.2)

## 2018-08-03 LAB — BASIC METABOLIC PANEL
Anion gap: 6 (ref 5–15)
BUN: 22 mg/dL (ref 8–23)
CO2: 23 mmol/L (ref 22–32)
Calcium: 10 mg/dL (ref 8.9–10.3)
Chloride: 110 mmol/L (ref 98–111)
Creatinine, Ser: 1.38 mg/dL — ABNORMAL HIGH (ref 0.61–1.24)
GFR calc Af Amer: >60 mL/min (ref >60)
GFR calc non Af Amer: 54 mL/min — ABNORMAL LOW (ref >60)
Glucose, Bld: 103 mg/dL — ABNORMAL HIGH (ref 70–99)
Potassium: 4.5 mmol/L (ref 3.5–5.1)
Sodium: 139 mmol/L (ref 135–145)

## 2018-08-03 SURGERY — AMPUTATION, FOOT, RAY
Anesthesia: General | Site: Foot | Laterality: Left

## 2018-08-03 MED ORDER — VANCOMYCIN HCL 10 G IV SOLR
1750.0000 mg | INTRAVENOUS | Status: DC
  Administered 2018-08-04 – 2018-08-06 (×3): 1750 mg via INTRAVENOUS
  Filled 2018-08-03 (×3): qty 1750

## 2018-08-03 MED ORDER — LIDOCAINE HCL (PF) 2 % IJ SOLN
INTRAMUSCULAR | Status: AC
  Filled 2018-08-03: qty 10

## 2018-08-03 MED ORDER — VANCOMYCIN HCL 1000 MG IV SOLR
INTRAVENOUS | Status: AC
  Filled 2018-08-03: qty 1000

## 2018-08-03 MED ORDER — PROPOFOL 500 MG/50ML IV EMUL
INTRAVENOUS | Status: AC
  Filled 2018-08-03: qty 50

## 2018-08-03 MED ORDER — ONDANSETRON HCL 4 MG/2ML IJ SOLN
INTRAMUSCULAR | Status: AC
  Filled 2018-08-03: qty 2

## 2018-08-03 MED ORDER — ONDANSETRON HCL 4 MG/2ML IJ SOLN
INTRAMUSCULAR | Status: DC | PRN
  Administered 2018-08-03: 4 mg via INTRAVENOUS

## 2018-08-03 MED ORDER — FENTANYL CITRATE (PF) 100 MCG/2ML IJ SOLN
INTRAMUSCULAR | Status: DC | PRN
  Administered 2018-08-03: 25 ug via INTRAVENOUS
  Administered 2018-08-03: 50 ug via INTRAVENOUS
  Administered 2018-08-03: 25 ug via INTRAVENOUS

## 2018-08-03 MED ORDER — BUPIVACAINE HCL 0.5 % IJ SOLN
INTRAMUSCULAR | Status: DC | PRN
  Administered 2018-08-03: 10 mL

## 2018-08-03 MED ORDER — FENTANYL CITRATE (PF) 100 MCG/2ML IJ SOLN: 25.0000 ug | INTRAMUSCULAR | Status: DC | PRN

## 2018-08-03 MED ORDER — CHLORHEXIDINE GLUCONATE CLOTH 2 % EX PADS
6.0000 | MEDICATED_PAD | Freq: Every day | CUTANEOUS | Status: DC
  Administered 2018-08-03 – 2018-08-09 (×4): 6 via TOPICAL

## 2018-08-03 MED ORDER — DOCUSATE SODIUM 100 MG PO CAPS
100.0000 mg | ORAL_CAPSULE | Freq: Two times a day (BID) | ORAL | Status: DC
  Administered 2018-08-03 – 2018-08-09 (×11): 100 mg via ORAL
  Filled 2018-08-03 (×12): qty 1

## 2018-08-03 MED ORDER — PHENOL 1.4 % MT LIQD
1.0000 | OROMUCOSAL | Status: DC | PRN
  Administered 2018-08-03: 22:00:00 1 via OROMUCOSAL
  Filled 2018-08-03: qty 177

## 2018-08-03 MED ORDER — ONDANSETRON HCL 4 MG/2ML IJ SOLN: 4.0000 mg | Freq: Once | INTRAMUSCULAR | Status: DC | PRN

## 2018-08-03 MED ORDER — PROPOFOL 10 MG/ML IV BOLUS
INTRAVENOUS | Status: DC | PRN
  Administered 2018-08-03: 40 mg via INTRAVENOUS
  Administered 2018-08-03: 130 mg via INTRAVENOUS

## 2018-08-03 MED ORDER — LIDOCAINE HCL (CARDIAC) PF 100 MG/5ML IV SOSY
PREFILLED_SYRINGE | INTRAVENOUS | Status: DC | PRN
  Administered 2018-08-03: 100 mg via INTRAVENOUS

## 2018-08-03 MED ORDER — SODIUM CHLORIDE 0.9 % IV SOLN
2.0000 g | Freq: Three times a day (TID) | INTRAVENOUS | Status: DC
  Administered 2018-08-03 – 2018-08-06 (×8): 2 g via INTRAVENOUS
  Filled 2018-08-03 (×12): qty 2

## 2018-08-03 MED ORDER — HYDROCODONE-ACETAMINOPHEN 5-325 MG PO TABS
1.0000 | ORAL_TABLET | ORAL | Status: DC | PRN
  Administered 2018-08-06 – 2018-08-08 (×3): 1 via ORAL
  Filled 2018-08-03 (×3): qty 1

## 2018-08-03 MED ORDER — BUPIVACAINE HCL (PF) 0.5 % IJ SOLN
INTRAMUSCULAR | Status: AC
  Filled 2018-08-03: qty 30

## 2018-08-03 MED ORDER — PHENYLEPHRINE HCL (PRESSORS) 10 MG/ML IV SOLN
INTRAVENOUS | Status: DC | PRN
  Administered 2018-08-03: 100 ug via INTRAVENOUS
  Administered 2018-08-03 (×2): 200 ug via INTRAVENOUS
  Administered 2018-08-03: 100 ug via INTRAVENOUS
  Administered 2018-08-03 (×2): 200 ug via INTRAVENOUS

## 2018-08-03 MED ORDER — FENTANYL CITRATE (PF) 100 MCG/2ML IJ SOLN
INTRAMUSCULAR | Status: AC
  Filled 2018-08-03: qty 2

## 2018-08-03 MED ORDER — LACTATED RINGERS IV SOLN
INTRAVENOUS | Status: DC | PRN
  Administered 2018-08-03: 12:00:00 via INTRAVENOUS

## 2018-08-03 SURGICAL SUPPLY — 50 items
BANDAGE ACE 4X5 VEL STRL LF (GAUZE/BANDAGES/DRESSINGS) ×3 IMPLANT
BLADE MED AGGRESSIVE (BLADE) ×3 IMPLANT
BLADE OSC/SAGITTAL MD 5.5X18 (BLADE) ×3 IMPLANT
BLADE SURG 15 STRL LF DISP TIS (BLADE) ×2 IMPLANT
BLADE SURG 15 STRL SS (BLADE) ×4
BLADE SURG MINI STRL (BLADE) ×3 IMPLANT
BNDG CONFORM 2 STRL LF (GAUZE/BANDAGES/DRESSINGS) ×3 IMPLANT
BNDG ESMARK 4X12 TAN STRL LF (GAUZE/BANDAGES/DRESSINGS) ×3 IMPLANT
BNDG GAUZE 4.5X4.1 6PLY STRL (MISCELLANEOUS) ×3 IMPLANT
CANISTER SUCT 1200ML W/VALVE (MISCELLANEOUS) ×3 IMPLANT
CLOSURE WOUND 1/4X4 (GAUZE/BANDAGES/DRESSINGS) ×1
CNTNR SPEC 2.5X3XGRAD LEK (MISCELLANEOUS) ×1
CONT SPEC 4OZ STER OR WHT (MISCELLANEOUS) ×2
CONTAINER SPEC 2.5X3XGRAD LEK (MISCELLANEOUS) ×1 IMPLANT
COVER WAND RF STERILE (DRAPES) ×3 IMPLANT
CUFF TOURN 18 STER (MISCELLANEOUS) ×3 IMPLANT
CUFF TOURN DUAL PL 12 NO SLV (MISCELLANEOUS) IMPLANT
DRAPE FLUOR MINI C-ARM 54X84 (DRAPES) ×3 IMPLANT
DURAPREP 26ML APPLICATOR (WOUND CARE) ×3 IMPLANT
ELECT REM PT RETURN 9FT ADLT (ELECTROSURGICAL) ×3
ELECTRODE REM PT RTRN 9FT ADLT (ELECTROSURGICAL) ×1 IMPLANT
GAUZE PETRO XEROFOAM 1X8 (MISCELLANEOUS) ×3 IMPLANT
GAUZE SPONGE 4X4 12PLY STRL (GAUZE/BANDAGES/DRESSINGS) ×3 IMPLANT
GAUZE XEROFORM 1X8 LF (GAUZE/BANDAGES/DRESSINGS) ×3 IMPLANT
GLOVE BIO SURGEON STRL SZ7.5 (GLOVE) ×3 IMPLANT
GLOVE INDICATOR 8.0 STRL GRN (GLOVE) ×3 IMPLANT
GOWN STRL REUS W/ TWL LRG LVL3 (GOWN DISPOSABLE) ×2 IMPLANT
GOWN STRL REUS W/TWL LRG LVL3 (GOWN DISPOSABLE) ×4
HANDPIECE VERSAJET DEBRIDEMENT (MISCELLANEOUS) ×3 IMPLANT
KIT TURNOVER KIT A (KITS) ×3 IMPLANT
LABEL OR SOLS (LABEL) ×3 IMPLANT
NEEDLE FILTER BLUNT 18X 1/2SAF (NEEDLE) ×2
NEEDLE FILTER BLUNT 18X1 1/2 (NEEDLE) ×1 IMPLANT
NEEDLE HYPO 25X1 1.5 SAFETY (NEEDLE) ×6 IMPLANT
NS IRRIG 500ML POUR BTL (IV SOLUTION) ×3 IMPLANT
PACK EXTREMITY ARMC (MISCELLANEOUS) ×3 IMPLANT
PAD ABD DERMACEA PRESS 5X9 (GAUZE/BANDAGES/DRESSINGS) ×3 IMPLANT
SOL .9 NS 3000ML IRR  AL (IV SOLUTION) ×2
SOL .9 NS 3000ML IRR UROMATIC (IV SOLUTION) ×1 IMPLANT
SOL PREP PVP 2OZ (MISCELLANEOUS) ×3
SOLUTION PREP PVP 2OZ (MISCELLANEOUS) ×1 IMPLANT
STOCKINETTE STRL 6IN 960660 (GAUZE/BANDAGES/DRESSINGS) ×3 IMPLANT
STRIP CLOSURE SKIN 1/4X4 (GAUZE/BANDAGES/DRESSINGS) ×2 IMPLANT
SUT ETHILON 3-0 FS-10 30 BLK (SUTURE) ×3
SUT ETHILON 4-0 (SUTURE)
SUT ETHILON 4-0 FS2 18XMFL BLK (SUTURE)
SUTURE EHLN 3-0 FS-10 30 BLK (SUTURE) ×1 IMPLANT
SUTURE ETHLN 4-0 FS2 18XMF BLK (SUTURE) IMPLANT
SWAB DUAL CULTURE TRANS RED ST (MISCELLANEOUS) ×3 IMPLANT
SYR 10ML LL (SYRINGE) ×3 IMPLANT

## 2018-08-03 NOTE — Anesthesia Procedure Notes (Signed)
Procedure Name: LMA Insertion Date/Time: 08/03/2018 12:40 PM Performed by: Hedda Slade, CRNA Pre-anesthesia Checklist: Patient identified, Patient being monitored, Timeout performed, Emergency Drugs available and Suction available Patient Re-evaluated:Patient Re-evaluated prior to induction Oxygen Delivery Method: Circle system utilized Preoxygenation: Pre-oxygenation with 100% oxygen Induction Type: IV induction Ventilation: Mask ventilation without difficulty LMA: LMA inserted LMA Size: 4.5 Tube type: Oral Number of attempts: 1 Placement Confirmation: positive ETCO2 and breath sounds checked- equal and bilateral Tube secured with: Tape Dental Injury: Teeth and Oropharynx as per pre-operative assessment

## 2018-08-03 NOTE — Transfer of Care (Signed)
Immediate Anesthesia Transfer of Care Note  Patient: Ruben Clark  Procedure(s) Performed: AMPUTATION RAY SECOND TOE (Left Foot)  Patient Location: PACU  Anesthesia Type:General  Level of Consciousness: awake, alert  and oriented  Airway & Oxygen Therapy: Patient Spontanous Breathing  Post-op Assessment: Report given to RN and Post -op Vital signs reviewed and stable  Post vital signs: Reviewed and stable  Last Vitals:  Vitals Value Taken Time  BP 107/80 08/03/2018  1:33 PM  Temp 36.2 C 08/03/2018  1:33 PM  Pulse 74 08/03/2018  1:34 PM  Resp 9 08/03/2018  1:34 PM  SpO2 100 % 08/03/2018  1:34 PM  Vitals shown include unvalidated device data.  Last Pain:  Vitals:   08/03/18 1333  TempSrc:   PainSc: 0-No pain         Complications: No apparent anesthesia complications

## 2018-08-03 NOTE — Op Note (Signed)
Date of operation 08/03/2018.  Surgeon: Durward Fortes D.P.M.  Preoperative diagnosis: 1.  Osteomyelitis left second metatarsal and second toe. 2.  Full-thickness ulceration with exposed bone left forefoot.  Postoperative diagnosis: Same.  Procedures: 1.  Amputation left second toe with partial second ray resection. 2.  Debridement full-thickness wound left forefoot.  Anesthesia: LMA.  Hemostasis: Pneumatic tourniquet left ankle 250 mmHg.  Estimated blood loss: Less than 5 cc.  Injectables: 10 cc 0.5% Marcaine plain.  Pathology: Left second toe and metatarsal.  Cultures: Bone cultures left second metatarsal head.  Implants: None.  Complications: None apparent.  Operative indications: This is a 63 year old male with a history of a chronic ulceration on his left forefoot.  Previous first ray resection.  Just recently moved to this area and was taken to the hospital with increasing drainage and admitted for bone infection.  Operative procedure: Patient was taken to the operating room and placed on the table in the supine position.  Following satisfactory LMA anesthesia a pneumatic tourniquet was applied at the level of the left ankle.  The foot was prepped and draped in the usual sterile fashion.  The foot was exsanguinated and the tourniquet inflated to 250 mmHg.      Attention was then directed to the dorsal aspect of the left foot where an approximate 6 cm linear incision was made coursing proximal to distal and carried medially and laterally around the base of the second toe.  The incision was deepened full-thickness down to the level of the bone and joint where the toe was noted to be dislocated from the metatarsal head.  The toe was removed from the field in [place].  Soft tissues freed from around the distal and midshaft area of the second metatarsal and the bone was incised using a sagittal saw and removed in toto.  A sample of bone from the second metatarsal head was taken for bone  culture.  At this point there was noted to be a large full-thickness wound from the original ulceration on the plantar aspect with some surrounding devitalized tissue.  Excisional debridement was performed full-thickness down through the superficial and deep fascia to the previously exposed joint area sharply using a rongeur.  A small incision was made at the proximal and distal aspect of the open wound and the skin edges were resected using a 15 blade.  All remaining surfaces from the dorsal and plantar incision areas were thoroughly debrided using a versa jet debrider on a setting of 4 and then irrigated on a setting of 2.  The wound was then flushed with copious amounts of sterile saline.  The dorsal incision was reapproximated using 3-0 nylon simple interrupted sutures followed by reapproximation of the plantar wound using 3-0 nylon simple interrupted sutures.  10 cc of 0.5% Marcaine plain was then injected for postoperative analgesia and the wound was covered with Xeroform 4 x 4's ABD and Kerlix.  Tourniquet was released.  A second Kerlix and Ace wrap then applied for compression.  Patient tolerated the procedure and anesthesia well and was awakened and transported to the PACU with vital signs stable and in good condition.

## 2018-08-03 NOTE — Anesthesia Preprocedure Evaluation (Signed)
Anesthesia Evaluation  Patient identified by MRN, date of birth, ID band Patient awake    Reviewed: Allergy & Precautions, NPO status , Patient's Chart, lab work & pertinent test results  History of Anesthesia Complications Negative for: history of anesthetic complications  Airway Mallampati: II  TM Distance: >3 FB Neck ROM: Full    Dental  (+) Poor Dentition, Missing   Pulmonary neg sleep apnea, COPD, Current Smoker,    breath sounds clear to auscultation- rhonchi (-) wheezing      Cardiovascular hypertension, Pt. on medications +CHF (EF 25%)  (-) CAD, (-) Past MI, (-) Cardiac Stents and (-) CABG + dysrhythmias (hx of afib)  Rhythm:Regular Rate:Normal - Systolic murmurs and - Diastolic murmurs Echo 59/29/24: There is mild concentric left ventricular hypertrophy.  The overall left ventricular systolic function is estimated at 25%.  Right ventricle is normal in size with reduced systolic function.  The left atrium is severely dilated.  The right atrium is dilated.  The IVC is dilated in size with less than 50% collapse during inspiration.   Neuro/Psych neg Seizures negative neurological ROS  negative psych ROS   GI/Hepatic negative GI ROS, Neg liver ROS,   Endo/Other  negative endocrine ROSneg diabetes  Renal/GU negative Renal ROS     Musculoskeletal negative musculoskeletal ROS (+)   Abdominal (+) - obese,   Peds  Hematology negative hematology ROS (+)   Anesthesia Other Findings Past Medical History: No date: CHF (congestive heart failure) (HCC) No date: Hypertension   Reproductive/Obstetrics                             Anesthesia Physical Anesthesia Plan  ASA: III  Anesthesia Plan: General   Post-op Pain Management:    Induction: Intravenous  PONV Risk Score and Plan: 0 and Ondansetron  Airway Management Planned: LMA  Additional Equipment:   Intra-op Plan:    Post-operative Plan:   Informed Consent: I have reviewed the patients History and Physical, chart, labs and discussed the procedure including the risks, benefits and alternatives for the proposed anesthesia with the patient or authorized representative who has indicated his/her understanding and acceptance.     Dental advisory given  Plan Discussed with: CRNA and Anesthesiologist  Anesthesia Plan Comments:         Anesthesia Quick Evaluation

## 2018-08-03 NOTE — Progress Notes (Signed)
Patient ID: Ruben Clark, male   DOB: 09/04/1955, 63 y.o.   MRN: [id number]  [facility] PROGRESS NOTE  Edrick Whitehorn GQQ:[id number] DOB: Jul 07, 1955 DOA: 08/02/2018 PCP: System, Pcp Not In  HPI/Subjective: Patient feels okay.  No pain in his foot.  Had fever as outpatient.  Came in with infected left foot ulcer and found to have osteomyelitis  Objective: Vitals:   08/02/18 2046 08/03/18 0452  BP: 104/71 111/67  Pulse: 82 (!) 52  Resp: 19 18  Temp: 98.7 F (37.1 C) 98 F (36.7 C)  SpO2: 97% 99%    Intake/Output Summary (Last 24 hours) at 08/03/2018 1007 Last data filed at 08/03/2018 0106 Gross per 24 hour  Intake 427.75 ml  Output 200 ml  Net 227.75 ml   Filed Weights   08/02/18 1532  Weight: 84.4 kg    ROS: Review of Systems  Constitutional: Negative for chills and fever.  Eyes: Negative for blurred vision.  Respiratory: Negative for cough and shortness of breath.   Cardiovascular: Negative for chest pain.  Gastrointestinal: Negative for abdominal pain, constipation, diarrhea, nausea and vomiting.  Genitourinary: Negative for dysuria.  Musculoskeletal: Negative for joint pain.  Neurological: Negative for dizziness and headaches.   Exam: Physical Exam  Constitutional: He is oriented to person, place, and time.  HENT:  Nose: No mucosal edema.  Mouth/Throat: No oropharyngeal exudate or posterior oropharyngeal edema.  Eyes: Pupils are equal, round, and reactive to light. Conjunctivae, EOM and lids are normal.  Neck: No JVD present. Carotid bruit is not present. No edema present. No thyroid mass and no thyromegaly present.  Cardiovascular: S1 normal and S2 normal. An irregularly irregular rhythm present. Exam reveals no gallop.  No murmur heard. Pulses:      Dorsalis pedis pulses are 2+ on the right side and 2+ on the left side.  Respiratory: No respiratory distress. He has no wheezes. He has no rhonchi. He has no rales.  GI: Soft. Bowel sounds are normal. There is no  abdominal tenderness.  Musculoskeletal:     Right shoulder: He exhibits no swelling.  Lymphadenopathy:    He has no cervical adenopathy.  Neurological: He is alert and oriented to person, place, and time. No cranial nerve deficit.  Skin: Skin is warm. Nails show no clubbing.  Left foot wrapped.  Reviewed picture from yesterday of infected ulcer.  Psychiatric: He has a normal mood and affect.      Data Reviewed: Basic Metabolic Panel: Recent Labs  Lab 08/02/18 0931 08/03/18 0522  NA 135 139  K 4.6 4.5  CL 106 110  CO2 22 23  GLUCOSE 108* 103*  BUN 23 22  CREATININE 1.68* 1.38*  CALCIUM 10.4* 10.0   CBC: Recent Labs  Lab 08/02/18 0931 08/03/18 0522  WBC 12.0* 10.6*  NEUTROABS 8.7*  --   HGB 13.3 12.2*  HCT 42.5 38.5*  MCV 95.7 93.4  PLT 259 304     Recent Results (from the past 240 hour(s))  Culture, blood (routine x 2)     Status: None (Preliminary result)   Collection Time: 08/02/18  9:31 AM  Result Value Ref Range Status   Specimen Description BLOOD RIGHT ANTECUBITAL  Final   Special Requests   Final    BOTTLES DRAWN AEROBIC AND ANAEROBIC Blood Culture adequate volume   Culture   Final    NO GROWTH < 24 HOURS Performed at [facility], [address], [place], [place] [address]    Report Status PENDING  Incomplete  Culture, blood (routine x 2)     Status: None (Preliminary result)   Collection Time: 08/02/18  9:36 AM  Result Value Ref Range Status   Specimen Description BLOOD BLOOD RIGHT HAND  Final   Special Requests   Final    BOTTLES DRAWN AEROBIC AND ANAEROBIC Blood Culture adequate volume   Culture   Final    NO GROWTH < 24 HOURS Performed at [facility], [address], [place], [place] [address]    Report Status PENDING  Incomplete     Studies: Mr Foot Left W Wo Contrast  Result Date: 08/02/2018 CLINICAL DATA:  Erythema and swelling with chronic source of the left foot. Prior great toe amputation. Osteomyelitis. EXAM:  MRI OF THE LEFT FOREFOOT WITHOUT AND WITH CONTRAST TECHNIQUE: Multiplanar, multisequence MR imaging of the left forefoot was performed both before and after administration of intravenous contrast. CONTRAST:  7.5 cc Gadavist COMPARISON:  Radiographs from 08/02/2018 FINDINGS: Bones/Joint/Cartilage Amputation of the first ray at the Lisfranc joint. Dorsal and proximal dislocation of the proximal phalanx of the second toe with respect to the second metatarsal. There is a large ulceration overlying the second metatarsal head probably with exposed bone, and bony destructive findings in the second metatarsal head with marrow edema and enhancement tracking in the second metatarsal head and shaft, with associated periosteal reaction. There is also appreciable marrow edema and enhancement in the base and proximal half of the proximal phalanx of the second toe which could be reactive or infectious. There is callus formation and focal edema and enhancement in the distal shaft of the third metatarsal, probably from a healing fracture and less likely to be due to active infection. Erosions or degenerative subcortical cyst formation along the Lisfranc joint at the bases of the second and third metatarsals. Ligaments The Lisfranc ligament is thickened. Muscles and Tendons Edema and enhancement in the plantar musculature of the foot and interosseous muscles which could be neurogenic or due to infectious myositis. Soft tissues Irregular subcutaneous edema and enhancement surrounding the ulcer crater extending to the head of the second metatarsal. Subcutaneous edema and enhancement dorsally in the foot possibly from cellulitis. IMPRESSION: 1. Active osteomyelitis of the second metatarsal, with erosion of the metatarsal head and a large cutaneous ulceration extending to the bony margin. 2. Dorsal and proximal dislocation of the proximal phalanx of the second toe with respect to the second metatarsal head. There is abnormal edema and  enhancement in the base of the proximal phalanx which could be infectious or due to arthropathy. 3. Callus formation and periosteal reaction along a segment of the distal shaft of the third metatarsal, probably from a healing fracture, less likely from chronic infection. 4. Arthropathy along the Lisfranc joint especially at the second and third digits. 5. Generalized muscular edema in the foot could be neurogenic or from infectious myositis. 6. Subcutaneous edema and enhancement in the dorsum of the foot, probably from cellulitis. Or confluence cellulitis medially in the foot around the large ulcer crater. Electronically Signed   By: Van Clines M.D.   On: 08/02/2018 21:22   Dg Foot 2 Views Left  Result Date: 08/02/2018 CLINICAL DATA:  Left toe pain.  Amputation 4 weeks ago. EXAM: LEFT FOOT - 2 VIEW COMPARISON:  None. FINDINGS: To the first metatarsal was amputated. There is dislocation of the second MTP joint laterally. There is diffuse cortical thickening and periosteal reaction along the medial aspect of the second metatarsal with erosive changes distally.  The third MTP joint demonstrates subluxation laterally as well. There is callus formation about the third metatarsal compatible with remote fracture. Diffuse soft tissue swelling is present about the ankle and foot. IMPRESSION: 1. Lateral dislocation of the second MTP joint. 2. Diffuse periosteal reaction along the medial aspect of the second metatarsal with erosive changes distally. This is concerning for osteomyelitis. 3. Extensive soft tissue swelling.  Infection is not excluded. 4. Remote fracture of the third metatarsal. Electronically Signed   By: San Morelle M.D.   On: 08/02/2018 09:56    Scheduled Meds: . aspirin  81 mg Oral Daily  . Chlorhexidine Gluconate Cloth  6 each Topical Q0600  . digoxin  0.125 mg Oral Daily  . furosemide  40 mg Oral Daily  . lisinopril  2.5 mg Oral Daily  . metoprolol succinate  100 mg Oral Daily   . nicotine  14 mg Transdermal Daily  . sodium chloride flush  10-40 mL Intracatheter Q12H   Continuous Infusions: . ceFEPime (MAXIPIME) IV    . [START ON 08/04/2018] vancomycin      Assessment/Plan:  1. Preoperative consultation for left second toe osteomyelitis.  No contraindications to surgery at this time. 2. Osteomyelitis of the second metatarsal.  Patient on IV antibiotics with Maxipime and vancomycin.  Depending on surgical procedure being done may need IV antibiotics versus p.o. antibiotics. 3. Chronic atrial fibrillation.  In reviewing cardiology note as outpatient.  The patient does have a history of atrial fibrillation.  On digoxin and metoprolol for rate control. 4. Chronic systolic heart failure with last echocardiogram showing an EF of 25%.  Careful with IV fluids.  No signs of heart failure currently.  Patient on metoprolol and lisinopril and Lasix. 5. Essential hypertension.  Continue metoprolol 6. Acute kidney injury on chronic kidney disease stage III.  Continue to monitor with oral diuresis. 7. Tobacco abuse  Code Status:     Code Status Orders  (From admission, onward)         Start     Ordered   08/02/18 1503  Full code  Continuous     08/02/18 1502        Code Status History    This patient has a current code status but no historical code status.      Disposition Plan: To be determined based on whether the patient needs IV antibiotics or oral antibiotics upon discharge  Consultants:  Podiatry  Antibiotics:  Vancomycin  Cefepime  Time spent: 28 minutes  [place]

## 2018-08-03 NOTE — Anesthesia Postprocedure Evaluation (Signed)
Anesthesia Post Note  Patient: Ruben Clark  Procedure(s) Performed: AMPUTATION RAY SECOND TOE (Left Foot)  Patient location during evaluation: PACU Anesthesia Type: General Level of consciousness: awake and alert and oriented Pain management: pain level controlled Vital Signs Assessment: post-procedure vital signs reviewed and stable Respiratory status: spontaneous breathing, nonlabored ventilation and respiratory function stable Cardiovascular status: blood pressure returned to baseline and stable Postop Assessment: no signs of nausea or vomiting Anesthetic complications: no     Last Vitals:  Vitals:   08/03/18 1344 08/03/18 1357  BP: (!) 143/78 116/80  Pulse: 89 81  Resp: 16 17  Temp:  36.5 C  SpO2: 100% 100%    Last Pain:  Vitals:   08/03/18 1357  TempSrc:   PainSc: 0-No pain                 Vitor Overbaugh

## 2018-08-03 NOTE — Progress Notes (Signed)
Pt has been trying to empty bladder  At freg intevals this pm. incont of  spurts of urine at intevals.incont.  md notified and order for I/o cath obtained. Pt felt relief after  I/o  Cath done. Dressing  Left  Foot d/i/ pt denies pain at  Foot.

## 2018-08-03 NOTE — Progress Notes (Signed)
Pharmacy Antibiotic Note  Ruben Clark is a 63 y.o. male admitted on 08/02/2018 with wound infection/ osteomyelitis.  Pharmacy has been consulted for Vancomycin and Cefepime dosing. 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 Patient Initially, he did not receive a completed dose of Cefepime in the ED d/t IV access issues/infiltration, but another dose was administered in the ED around 1500-per Barbette Merino, RN. Therefore, he did not receive Vancomycin 1000 mg in the ED-essentially no antibiotics were given, per Barbette Merino, RN, when the medications were first ordered. 1000 mg Vancomycin was given around 1500 as well.   Verbal given by Kerby Less on his weight (lbs): 186.5 lbs.  Will need to change dosing today (4/28) per improved renal function (Scr 1.68-1.38)  Plan: Will change Cefepime 2g Q12H to Cefepime 2g q 8h and continue to monitor Scr with AM labs.   Changing maintenance dose of Vancomycin from 1500 mg Q24H to   Vancomycin 1750 mg IV Q 24 hrs. Goal AUC 400-550. Expected AUC: 510 SCr used: 1.38   Height: 6\' 1"  (185.4 cm) Weight: 186 lb (84.4 kg) IBW/kg (Calculated) : 79.9 Temp (24hrs), Avg:98.4 F (36.9 C), Min:98 F (36.7 C), Max:98.8 F (37.1 C)  Recent Labs  Lab 08/02/18 0931 08/03/18 0522  WBC 12.0* 10.6*  CREATININE 1.68* 1.38*  LATICACIDVEN 0.8  --     Estimated Creatinine Clearance: 62.7 mL/min (A) (by C-G formula based on SCr of 1.38 mg/dL (H)).   Wt (kg): 84.8 kg Scr (mg/dL): 1.68 (08/02/2018)     Baseline Scr 1.6 mg/dL (02/06/2018)     CrCl 70mL/min  No Known Allergies  Antimicrobials this admission: 04/27 Cefepime >>  04/27  Vancomycin >>   Dose adjustments this admission: As outlined above  Microbiology results: 4/27 BCx: pending   Thank you for allowing pharmacy to be a part of this patient's care.  Lu Duffel, PharmD, BCPS Clinical Pharmacist 08/03/2018 7:29 AM

## 2018-08-03 NOTE — Anesthesia Post-op Follow-up Note (Signed)
Anesthesia QCDR form completed.        

## 2018-08-04 ENCOUNTER — Encounter: Payer: Self-pay | Admitting: Podiatry

## 2018-08-04 ENCOUNTER — Inpatient Hospital Stay: Payer: Self-pay

## 2018-08-04 LAB — CBC WITH DIFFERENTIAL/PLATELET
Abs Immature Granulocytes: 0.05 10*3/uL (ref 0.00–0.07)
Basophils Absolute: 0.1 10*3/uL (ref 0.0–0.1)
Basophils Relative: 1 %
Eosinophils Absolute: 0.2 10*3/uL (ref 0.0–0.5)
Eosinophils Relative: 1 %
HCT: 37.1 % — ABNORMAL LOW (ref 39.0–52.0)
Hemoglobin: 11.8 g/dL — ABNORMAL LOW (ref 13.0–17.0)
Immature Granulocytes: 0 %
Lymphocytes Relative: 10 %
Lymphs Abs: 1.3 10*3/uL (ref 0.7–4.0)
MCH: 29.9 pg (ref 26.0–34.0)
MCHC: 31.8 g/dL (ref 30.0–36.0)
MCV: 93.9 fL (ref 80.0–100.0)
Monocytes Absolute: 1.3 10*3/uL — ABNORMAL HIGH (ref 0.1–1.0)
Monocytes Relative: 10 %
Neutro Abs: 10.3 10*3/uL — ABNORMAL HIGH (ref 1.7–7.7)
Neutrophils Relative %: 78 %
Platelets: 287 10*3/uL (ref 150–400)
RBC: 3.95 MIL/uL — ABNORMAL LOW (ref 4.22–5.81)
RDW: 13 % (ref 11.5–15.5)
WBC: 13.2 10*3/uL — ABNORMAL HIGH (ref 4.0–10.5)
nRBC: 0 % (ref 0.0–0.2)

## 2018-08-04 LAB — CREATININE, SERUM
Creatinine, Ser: 1.46 mg/dL — ABNORMAL HIGH (ref 0.61–1.24)
GFR calc Af Amer: 59 mL/min — ABNORMAL LOW
GFR calc non Af Amer: 51 mL/min — ABNORMAL LOW

## 2018-08-04 LAB — SEDIMENTATION RATE: Sed Rate: 56 mm/h — ABNORMAL HIGH (ref 0–20)

## 2018-08-04 LAB — HIV ANTIBODY (ROUTINE TESTING W REFLEX): HIV Screen 4th Generation wRfx: NONREACTIVE

## 2018-08-04 MED ORDER — TAMSULOSIN HCL 0.4 MG PO CAPS
0.4000 mg | ORAL_CAPSULE | Freq: Every day | ORAL | Status: DC
  Administered 2018-08-04: 18:00:00 0.4 mg via ORAL
  Filled 2018-08-04: qty 1

## 2018-08-04 NOTE — Consult Note (Signed)
Infectious Disease     Reason for Consult: Osteomyelitis    Referring Physician: Dr Earleen Newport Date of Admission:  08/02/2018   Active Problems:   Left foot infection   HPI: Ruben Clark is a 63 y.o. male with a history of tobacco use who was admitted April 27 from his group home with a history of increasing redness and drainage from a chronic left foot wound.  He on admission had a white count of 12 creatinine at 1.68 and an x-ray showing osteomyelitis of the second metatarsal.  He was started on antibiotics and seen by podiatry.  MRI was done which showed osteomyelitis of the second metatarsal with erosion of the metatarsal head and a large cutaneous ulceration.  There is also inflammation of the proximal phalanx of the second toe.  There also seem to be some periosteal reaction along the distal shaft of the third metatarsal and subcutaneous edema and enhancement of the dorsum of the foot probably from cellulitis.  He was seen by podiatry and underwent surgery on the 28th with findings of osteomyelitis of the left second metatarsal and second toe and full-thickness ulceration with exposed bone in the left forefoot.  He underwent the amputation of the Lex left second toe with partial second ray resection and debridement of full-thickness wound on the foot.  Cultures were sent and are pending.  Blood cultures are negative to date.  Sed rate is 56 And CRP is 6.4.  HIV testing is negative. Patient tells me he has moved here recently from the [place] area.  He is living in a group home.  He denies having history of diabetes but does smoke a pack a day.  He denies having any history of circulation issues. Past Medical History:  Diagnosis Date  . CHF (congestive heart failure) ([place])   . Hypertension    Past Surgical History:  Procedure Laterality Date  . AMPUTATION Left 08/03/2018   Procedure: AMPUTATION RAY SECOND TOE;  Surgeon: Sharlotte Alamo, DPM;  Location: [facility] ORS;  Service: Podiatry;  Laterality: Left;   . TOE AMPUTATION     Social History   Tobacco Use  . Smoking status: Current Every Day Smoker  . Smokeless tobacco: Never Used  Substance Use Topics  . Alcohol use: Never    Frequency: Never  . Drug use: Never   History reviewed. No pertinent family history.  Allergies: No Known Allergies  Current antibiotics: Antibiotics Given (last 72 hours)    Date/Time Action Medication Dose Rate   08/02/18 1049 New Bag/Given   ceFEPIme (MAXIPIME) 2 g in sodium chloride 0.9 % 100 mL IVPB 2 g 200 mL/hr   08/02/18 1505 New Bag/Given   vancomycin (VANCOCIN) IVPB 1000 mg/200 mL premix 1,000 mg 200 mL/hr   08/02/18 1627 New Bag/Given   vancomycin (VANCOCIN) IVPB 1000 mg/200 mL premix 1,000 mg 200 mL/hr   08/03/18 0400 New Bag/Given   ceFEPIme (MAXIPIME) 2 g in sodium chloride 0.9 % 100 mL IVPB 2 g 200 mL/hr   08/03/18 0510 New Bag/Given   vancomycin (VANCOCIN) 1,500 mg in sodium chloride 0.9 % 500 mL IVPB 1,500 mg 250 mL/hr   08/03/18 2152 New Bag/Given   ceFEPIme (MAXIPIME) 2 g in sodium chloride 0.9 % 100 mL IVPB 2 g 200 mL/hr   08/04/18 0308 New Bag/Given   vancomycin (VANCOCIN) 1,750 mg in sodium chloride 0.9 % 500 mL IVPB 1,750 mg 250 mL/hr   08/04/18 0541 New Bag/Given   ceFEPIme (MAXIPIME) 2 g in sodium  chloride 0.9 % 100 mL IVPB 2 g 200 mL/hr   08/04/18 1409 New Bag/Given   ceFEPIme (MAXIPIME) 2 g in sodium chloride 0.9 % 100 mL IVPB 2 g 200 mL/hr      MEDICATIONS: . aspirin  81 mg Oral Daily  . Chlorhexidine Gluconate Cloth  6 each Topical Q0600  . digoxin  0.125 mg Oral Daily  . docusate sodium  100 mg Oral BID  . furosemide  40 mg Oral Daily  . lisinopril  2.5 mg Oral Daily  . metoprolol succinate  100 mg Oral Daily  . nicotine  14 mg Transdermal Daily  . sodium chloride flush  10-40 mL Intracatheter Q12H  . tamsulosin  0.4 mg Oral QPC supper    Review of Systems - 11 systems reviewed and negative per HPI   OBJECTIVE: Temp:  [98 F (36.7 C)-98.9 F (37.2 C)] 98.3  F (36.8 C) (04/29 0810) Pulse Rate:  [62-89] 87 (04/29 0810) Resp:  [15-18] 18 (04/29 0545) BP: (104-120)/(70-90) 120/76 (04/29 0810) SpO2:  [97 %-100 %] 98 % (04/29 0810) Physical Exam  Constitutional: He is oriented to person, place, and time. dishevled HENT: anicteric Mouth/Throat: Oropharynx is clear and moist. No oropharyngeal exudate.  Cardiovascular: Normal rate, regular rhythm and normal heart sounds.  Pulmonary/Chest: Effort normal and breath sounds normal. No respiratory distress. He has no wheezes.  Abdominal: Soft. Bowel sounds are normal. He exhibits no distension. There is no tenderness.  Lymphadenopathy: He has no cervical adenopathy.  Neurological: He is alert and oriented to person, place, and time.  Skin: Foot with bulky dressing postop.   Psychiatric: He has a normal mood and affect. His behavior is normal.    LABS: Results for orders placed or performed during the hospital encounter of 08/02/18 (from the past 48 hour(s))  HIV antibody (Routine Testing)     Status: None   Collection Time: 08/03/18  5:22 AM  Result Value Ref Range   HIV Screen 4th Generation wRfx Non Reactive Non Reactive    Comment: (NOTE) Performed At: [facility] [place], [place] [id number] Rush Farmer MD YT:[id number]   Basic metabolic panel     Status: Abnormal   Collection Time: 08/03/18  5:22 AM  Result Value Ref Range   Sodium 139 135 - 145 mmol/L   Potassium 4.5 3.5 - 5.1 mmol/L   Chloride 110 98 - 111 mmol/L   CO2 23 22 - 32 mmol/L   Glucose, Bld 103 (H) 70 - 99 mg/dL   BUN 22 8 - 23 mg/dL   Creatinine, Ser 1.38 (H) 0.61 - 1.24 mg/dL   Calcium 10.0 8.9 - 10.3 mg/dL   GFR calc non Af Amer 54 (L) >60 mL/min   GFR calc Af Amer >60 >60 mL/min   Anion gap 6 5 - 15    Comment: Performed at [facility], [address] [place], [place] [address]  CBC     Status: Abnormal   Collection Time: 08/03/18  5:22 AM  Result Value Ref Range   WBC 10.6  (H) 4.0 - 10.5 K/uL   RBC 4.12 (L) 4.22 - 5.81 MIL/uL   Hemoglobin 12.2 (L) 13.0 - 17.0 g/dL   HCT 38.5 (L) 39.0 - 52.0 %   MCV 93.4 80.0 - 100.0 fL   MCH 29.6 26.0 - 34.0 pg   MCHC 31.7 30.0 - 36.0 g/dL   RDW 13.1 11.5 - 15.5 %   Platelets 304 150 - 400 K/uL  nRBC 0.0 0.0 - 0.2 %    Comment: Performed at [facility], [address] [place], [place] [address]  Aerobic/Anaerobic Culture (surgical/deep wound)     Status: None (Preliminary result)   Collection Time: 08/03/18  1:05 PM  Result Value Ref Range   Specimen Description      TOE LEFT GREAT TOE Performed at [facility], [address] [place], [place] [address]    Special Requests      NONE Performed at [facility], [place], [place] [address]    Gram Stain      FEW WBC PRESENT, PREDOMINANTLY PMN NO ORGANISMS SEEN    Culture      NO GROWTH < 24 HOURS Performed at [facility], [place] [address], [place], [place] [address]    Report Status PENDING   Creatinine, serum     Status: Abnormal   Collection Time: 08/04/18  5:02 AM  Result Value Ref Range   Creatinine, Ser 1.46 (H) 0.61 - 1.24 mg/dL   GFR calc non Af Amer 51 (L) >60 mL/min   GFR calc Af Amer 59 (L) >60 mL/min    Comment: Performed at [facility], [address] [place], [place] [address]  CBC with Differential/Platelet     Status: Abnormal   Collection Time: 08/04/18  5:02 AM  Result Value Ref Range   WBC 13.2 (H) 4.0 - 10.5 K/uL   RBC 3.95 (L) 4.22 - 5.81 MIL/uL   Hemoglobin 11.8 (L) 13.0 - 17.0 g/dL   HCT 37.1 (L) 39.0 - 52.0 %   MCV 93.9 80.0 - 100.0 fL   MCH 29.9 26.0 - 34.0 pg   MCHC 31.8 30.0 - 36.0 g/dL   RDW 13.0 11.5 - 15.5 %   Platelets 287 150 - 400 K/uL   nRBC 0.0 0.0 - 0.2 %   Neutrophils Relative % 78 %   Neutro Abs 10.3 (H) 1.7 - 7.7 K/uL   Lymphocytes Relative 10 %   Lymphs Abs 1.3 0.7 - 4.0 K/uL   Monocytes Relative 10 %   Monocytes Absolute 1.3 (H) 0.1 - 1.0 K/uL    Eosinophils Relative 1 %   Eosinophils Absolute 0.2 0.0 - 0.5 K/uL   Basophils Relative 1 %   Basophils Absolute 0.1 0.0 - 0.1 K/uL   Immature Granulocytes 0 %   Abs Immature Granulocytes 0.05 0.00 - 0.07 K/uL    Comment: Performed at [facility], [address] [place], [place] [address]  Sedimentation rate     Status: Abnormal   Collection Time: 08/04/18  5:02 AM  Result Value Ref Range   Sed Rate 56 (H) 0 - 20 mm/hr    Comment: Performed at [facility], [address] [place], [place] [address]   No components found for: ESR, C REACTIVE PROTEIN MICRO: Recent Results (from the past 720 hour(s))  Culture, blood (routine x 2)     Status: None (Preliminary result)   Collection Time: 08/02/18  9:31 AM  Result Value Ref Range Status   Specimen Description BLOOD RIGHT ANTECUBITAL  Final   Special Requests   Final    BOTTLES DRAWN AEROBIC AND ANAEROBIC Blood Culture adequate volume   Culture   Final    NO GROWTH 2 DAYS Performed at [facility], [address], [place],  [address]    Report Status PENDING  Incomplete  Culture, blood (routine x 2)  Status: None (Preliminary result)   Collection Time: 08/02/18  9:36 AM  Result Value Ref Range Status   Specimen Description BLOOD BLOOD RIGHT HAND  Final   Special Requests   Final    BOTTLES DRAWN AEROBIC AND ANAEROBIC Blood Culture adequate volume   Culture   Final    NO GROWTH 2 DAYS Performed at [facility], [address] [place], [place] [address]    Report Status PENDING  Incomplete  Aerobic/Anaerobic Culture (surgical/deep wound)     Status: None (Preliminary result)   Collection Time: 08/03/18  1:05 PM  Result Value Ref Range Status   Specimen Description   Final    TOE LEFT GREAT TOE Performed at [facility], [address], [place], [place] [address]    Special Requests   Final    NONE Performed at [facility], [address]  [place], [place] [address]    Gram Stain   Final    FEW WBC PRESENT, PREDOMINANTLY PMN NO ORGANISMS SEEN    Culture   Final    NO GROWTH < 24 HOURS Performed at [facility] [address] [place], [place] [address]    Report Status PENDING  Incomplete    IMAGING: Mr Foot Left W Wo Contrast  Result Date: 08/02/2018 CLINICAL DATA:  Erythema and swelling with chronic source of the left foot. Prior great toe amputation. Osteomyelitis. EXAM: MRI OF THE LEFT FOREFOOT WITHOUT AND WITH CONTRAST TECHNIQUE: Multiplanar, multisequence MR imaging of the left forefoot was performed both before and after administration of intravenous contrast. CONTRAST:  7.5 cc Gadavist COMPARISON:  Radiographs from 08/02/2018 FINDINGS: Bones/Joint/Cartilage Amputation of the first ray at the Lisfranc joint. Dorsal and proximal dislocation of the proximal phalanx of the second toe with respect to the second metatarsal. There is a large ulceration overlying the second metatarsal head probably with exposed bone, and bony destructive findings in the second metatarsal head with marrow edema and enhancement tracking in the second metatarsal head and shaft, with associated periosteal reaction. There is also appreciable marrow edema and enhancement in the base and proximal half of the proximal phalanx of the second toe which could be reactive or infectious. There is callus formation and focal edema and enhancement in the distal shaft of the third metatarsal, probably from a healing fracture and less likely to be due to active infection. Erosions or degenerative subcortical cyst formation along the Lisfranc joint at the bases of the second and third metatarsals. Ligaments The Lisfranc ligament is thickened. Muscles and Tendons Edema and enhancement in the plantar musculature of the foot and interosseous muscles which could be neurogenic or due to infectious myositis. Soft tissues Irregular subcutaneous edema and enhancement surrounding  the ulcer crater extending to the head of the second metatarsal. Subcutaneous edema and enhancement dorsally in the foot possibly from cellulitis. IMPRESSION: 1. Active osteomyelitis of the second metatarsal, with erosion of the metatarsal head and a large cutaneous ulceration extending to the bony margin. 2. Dorsal and proximal dislocation of the proximal phalanx of the second toe with respect to the second metatarsal head. There is abnormal edema and enhancement in the base of the proximal phalanx which could be infectious or due to arthropathy. 3. Callus formation and periosteal reaction along a segment of the distal shaft of the third metatarsal, probably from a healing fracture, less likely from chronic infection. 4. Arthropathy along the Lisfranc joint especially at the second and third digits. 5. Generalized  muscular edema in the foot could be neurogenic or from infectious myositis. 6. Subcutaneous edema and enhancement in the dorsum of the foot, probably from cellulitis. Or confluence cellulitis medially in the foot around the large ulcer crater. Electronically Signed   By: Van Clines M.D.   On: 08/02/2018 21:22   Dg Foot 2 Views Left  Result Date: 08/02/2018 CLINICAL DATA:  Left toe pain.  Amputation 4 weeks ago. EXAM: LEFT FOOT - 2 VIEW COMPARISON:  None. FINDINGS: To the first metatarsal was amputated. There is dislocation of the second MTP joint laterally. There is diffuse cortical thickening and periosteal reaction along the medial aspect of the second metatarsal with erosive changes distally. The third MTP joint demonstrates subluxation laterally as well. There is callus formation about the third metatarsal compatible with remote fracture. Diffuse soft tissue swelling is present about the ankle and foot. IMPRESSION: 1. Lateral dislocation of the second MTP joint. 2. Diffuse periosteal reaction along the medial aspect of the second metatarsal with erosive changes distally. This is concerning  for osteomyelitis. 3. Extensive soft tissue swelling.  Infection is not excluded. 4. Remote fracture of the third metatarsal. Electronically Signed   By: San Morelle M.D.   On: 08/02/2018 09:56    Assessment:   Carlyn Mullenbach is a 63 y.o. male chronic combined systolic and diastolic CHF, hypertension, chronic atrial fibrillation, hyperlipidemia, COPD, tobacco use admitted with worsening wound on left foot.  He was found to have open ulceration and osteomyelitis of the second metatarsal and second toe.  On April 28 he underwent second toe and partial ray amputation.  Cultures are pending.  He has been started on vancomycin and cefepime.  He has no known drug allergies.  Patient lives in a group home.  I do not think he will be very capable of caring for his wound and doing IV antibiotics which he certainly will need.  Recommendations Place PICC line. Would plan on treatment for 2 to 4 weeks with IV antibiotics followed by an oral tail. Final antibiotic selection will be based on culture results. I have discussed this plan with the patient.  I have ordered a PICC line. Wound care and further surgical management per podiatry. Thank you very much for allowing me to participate in the care of this patient. Please call with questions.   Cheral Marker. Ola Spurr, MD

## 2018-08-04 NOTE — Progress Notes (Signed)
1 Day Post-Op   Subjective/Chief Complaint: Patient seen.  No complaints of pain or any problems.   Objective: Vital signs in last 24 hours: Temp:  [97.2 F (36.2 C)-98.9 F (37.2 C)] 98.3 F (36.8 C) (04/29 0810) Pulse Rate:  [62-89] 87 (04/29 0810) Resp:  [8-20] 18 (04/29 0545) BP: (104-143)/(68-90) 120/76 (04/29 0810) SpO2:  [97 %-100 %] 98 % (04/29 0810) Last BM Date: 08/03/18  Intake/Output from previous day: 04/28 0701 - 04/29 0700 In: 1080 [P.O.:480; I.V.:600] Out: 1300 [Urine:1300] Intake/Output this shift: Total I/O In: 120 [P.O.:120] Out: 250 [Urine:250]  Only mild bleeding on the bandaging.  Upon removal the incisions are well coapted with significantly decreased edema and erythema.  There is a small amount of expressible discharge with some apparent evidence for residual purulence.  Lab Results:  Recent Labs    08/03/18 0522 08/04/18 0502  WBC 10.6* 13.2*  HGB 12.2* 11.8*  HCT 38.5* 37.1*  PLT 304 287   BMET Recent Labs    08/02/18 0931 08/03/18 0522 08/04/18 0502  NA 135 139  --   K 4.6 4.5  --   CL 106 110  --   CO2 22 23  --   GLUCOSE 108* 103*  --   BUN 23 22  --   CREATININE 1.68* 1.38* 1.46*  CALCIUM 10.4* 10.0  --    PT/INR No results for input(s): LABPROT, INR in the last 72 hours. ABG No results for input(s): PHART, HCO3 in the last 72 hours.  Invalid input(s): PCO2, PO2  Studies/Results: Mr Foot Left W Wo Contrast  Result Date: 08/02/2018 CLINICAL DATA:  Erythema and swelling with chronic source of the left foot. Prior great toe amputation. Osteomyelitis. EXAM: MRI OF THE LEFT FOREFOOT WITHOUT AND WITH CONTRAST TECHNIQUE: Multiplanar, multisequence MR imaging of the left forefoot was performed both before and after administration of intravenous contrast. CONTRAST:  7.5 cc Gadavist COMPARISON:  Radiographs from 08/02/2018 FINDINGS: Bones/Joint/Cartilage Amputation of the first ray at the Lisfranc joint. Dorsal and proximal  dislocation of the proximal phalanx of the second toe with respect to the second metatarsal. There is a large ulceration overlying the second metatarsal head probably with exposed bone, and bony destructive findings in the second metatarsal head with marrow edema and enhancement tracking in the second metatarsal head and shaft, with associated periosteal reaction. There is also appreciable marrow edema and enhancement in the base and proximal half of the proximal phalanx of the second toe which could be reactive or infectious. There is callus formation and focal edema and enhancement in the distal shaft of the third metatarsal, probably from a healing fracture and less likely to be due to active infection. Erosions or degenerative subcortical cyst formation along the Lisfranc joint at the bases of the second and third metatarsals. Ligaments The Lisfranc ligament is thickened. Muscles and Tendons Edema and enhancement in the plantar musculature of the foot and interosseous muscles which could be neurogenic or due to infectious myositis. Soft tissues Irregular subcutaneous edema and enhancement surrounding the ulcer crater extending to the head of the second metatarsal. Subcutaneous edema and enhancement dorsally in the foot possibly from cellulitis. IMPRESSION: 1. Active osteomyelitis of the second metatarsal, with erosion of the metatarsal head and a large cutaneous ulceration extending to the bony margin. 2. Dorsal and proximal dislocation of the proximal phalanx of the second toe with respect to the second metatarsal head. There is abnormal edema and enhancement in the base of the proximal phalanx  which could be infectious or due to arthropathy. 3. Callus formation and periosteal reaction along a segment of the distal shaft of the third metatarsal, probably from a healing fracture, less likely from chronic infection. 4. Arthropathy along the Lisfranc joint especially at the second and third digits. 5. Generalized  muscular edema in the foot could be neurogenic or from infectious myositis. 6. Subcutaneous edema and enhancement in the dorsum of the foot, probably from cellulitis. Or confluence cellulitis medially in the foot around the large ulcer crater. Electronically Signed   By: Van Clines M.D.   On: 08/02/2018 21:22    Anti-infectives: Anti-infectives (From admission, onward)   Start     Dose/Rate Route Frequency Ordered Stop   08/04/18 0300  vancomycin (VANCOCIN) 1,750 mg in sodium chloride 0.9 % 500 mL IVPB     1,750 mg 250 mL/hr over 120 Minutes Intravenous Every 24 hours 08/03/18 0723     08/03/18 1400  ceFEPIme (MAXIPIME) 2 g in sodium chloride 0.9 % 100 mL IVPB     2 g 200 mL/hr over 30 Minutes Intravenous Every 8 hours 08/03/18 0724     08/03/18 0300  vancomycin (VANCOCIN) 1,500 mg in sodium chloride 0.9 % 500 mL IVPB  Status:  Discontinued     1,500 mg 250 mL/hr over 120 Minutes Intravenous Every 24 hours 08/02/18 1459 08/03/18 0723   08/02/18 1700  vancomycin (VANCOCIN) IVPB 1000 mg/200 mL premix     1,000 mg 200 mL/hr over 60 Minutes Intravenous  Once 08/02/18 1459 08/02/18 1727   08/02/18 1045  vancomycin (VANCOCIN) IVPB 1000 mg/200 mL premix     1,000 mg 200 mL/hr over 60 Minutes Intravenous  Once 08/02/18 1036 08/02/18 1605   08/02/18 1045  ceFEPIme (MAXIPIME) 2 g in sodium chloride 0.9 % 100 mL IVPB     2 g 200 mL/hr over 30 Minutes Intravenous  Once 08/02/18 1036 08/02/18 1500   08/02/18 0300  ceFEPIme (MAXIPIME) 2 g in sodium chloride 0.9 % 100 mL IVPB  Status:  Discontinued     2 g 200 mL/hr over 30 Minutes Intravenous Every 12 hours 08/02/18 1459 08/03/18 0724      Assessment/Plan: s/p Procedure(s): AMPUTATION RAY SECOND TOE (Left) Assessment: Osteomyelitis status post debridement.   Plan: Betadine and a sterile bandage reapplied to the left foot.  Discussed with the patient that we may need to go back in and clean the wound out a second time if it continues to  show signs of some purulence.  Discussed that if so we may want to place some antibiotic beads to help with infection.  At this point I will plan on reassessing the wound tomorrow and make a decision on any further debridements.  Patient may begin bathroom privileges with pressure only on the left heel using his wedge shoe.  LOS: 2 days    Durward Fortes 08/04/2018

## 2018-08-04 NOTE — Progress Notes (Addendum)
Patient ID: Ruben Clark, male   DOB: 1955-11-22, 63 y.o.   MRN: [id number]  [employer] PROGRESS NOTE  Adarsh Mundorf [id number] DOB: Aug 12, 1955 DOA: 08/02/2018 PCP: System, Pcp Not In  HPI/Subjective: Patient feeling okay.  His major complaint is that he is unable to go to the bathroom by himself.  Last night had to be in and out cath because of urinary retention.  Urinating a little bit at a time currently.  Not really having much pain in his foot.  Objective: Vitals:   08/04/18 0545 08/04/18 0810  BP: 104/74 120/76  Pulse: 78 87  Resp: 18   Temp: 98 F (36.7 C) 98.3 F (36.8 C)  SpO2: 98% 98%    Filed Weights   08/02/18 1532 08/03/18 1207  Weight: 84.4 kg 84.4 kg    ROS: Review of Systems  Constitutional: Negative for chills and fever.  Eyes: Negative for blurred vision.  Respiratory: Negative for cough and shortness of breath.   Cardiovascular: Negative for chest pain.  Gastrointestinal: Negative for abdominal pain, constipation, diarrhea, nausea and vomiting.  Genitourinary: Negative for dysuria.  Musculoskeletal: Negative for joint pain.  Neurological: Negative for dizziness and headaches.   Exam: Physical Exam  Constitutional: He is oriented to person, place, and time.  HENT:  Nose: No mucosal edema.  Mouth/Throat: No oropharyngeal exudate or posterior oropharyngeal edema.  Eyes: Pupils are equal, round, and reactive to light. Conjunctivae, EOM and lids are normal.  Neck: No JVD present. Carotid bruit is not present. No edema present. No thyroid mass and no thyromegaly present.  Cardiovascular: S1 normal and S2 normal. An irregularly irregular rhythm present. Exam reveals no gallop.  No murmur heard. Pulses:      Dorsalis pedis pulses are 2+ on the right side and 2+ on the left side.  Respiratory: No respiratory distress. He has no wheezes. He has no rhonchi. He has no rales.  GI: Soft. Bowel sounds are normal. There is no abdominal tenderness.   Musculoskeletal:     Right shoulder: He exhibits no swelling.  Lymphadenopathy:    He has no cervical adenopathy.  Neurological: He is alert and oriented to person, place, and time. No cranial nerve deficit.  Skin: Skin is warm. Nails show no clubbing.  Left foot wrapped.  Psychiatric: He has a normal mood and affect.      Data Reviewed: Basic Metabolic Panel: Recent Labs  Lab 08/02/18 0931 08/03/18 0522 08/04/18 0502  NA 135 139  --   K 4.6 4.5  --   CL 106 110  --   CO2 22 23  --   GLUCOSE 108* 103*  --   BUN 23 22  --   CREATININE 1.68* 1.38* 1.46*  CALCIUM 10.4* 10.0  --    CBC: Recent Labs  Lab 08/02/18 0931 08/03/18 0522 08/04/18 0502  WBC 12.0* 10.6* 13.2*  NEUTROABS 8.7*  --  10.3*  HGB 13.3 12.2* 11.8*  HCT 42.5 38.5* 37.1*  MCV 95.7 93.4 93.9  PLT 259 304 287     Recent Results (from the past 240 hour(s))  Culture, blood (routine x 2)     Status: None (Preliminary result)   Collection Time: 08/02/18  9:31 AM  Result Value Ref Range Status   Specimen Description BLOOD RIGHT ANTECUBITAL  Final   Special Requests   Final    BOTTLES DRAWN AEROBIC AND ANAEROBIC Blood Culture adequate volume   Culture   Final    NO GROWTH 2 DAYS  Performed at [facility], [address] [place], [place] [address]    Report Status PENDING  Incomplete  Culture, blood (routine x 2)     Status: None (Preliminary result)   Collection Time: 08/02/18  9:36 AM  Result Value Ref Range Status   Specimen Description BLOOD BLOOD RIGHT HAND  Final   Special Requests   Final    BOTTLES DRAWN AEROBIC AND ANAEROBIC Blood Culture adequate volume   Culture   Final    NO GROWTH 2 DAYS Performed at [facility], [address], [place], [place] [address]    Report Status PENDING  Incomplete  Aerobic/Anaerobic Culture (surgical/deep wound)     Status: None (Preliminary result)   Collection Time: 08/03/18  1:05 PM  Result Value Ref Range Status   Specimen  Description   Final    TOE LEFT GREAT TOE Performed at [facility], [address] [place], [place] [address]    Special Requests   Final    NONE Performed at [facility], [address] [place], [place] [address]    Gram Stain   Final    FEW WBC PRESENT, PREDOMINANTLY PMN NO ORGANISMS SEEN    Culture   Final    NO GROWTH < 24 HOURS Performed at [place] [address] [place], [place] [address]    Report Status PENDING  Incomplete     Studies: Mr Foot Left W Wo Contrast  Result Date: 08/02/2018 CLINICAL DATA:  Erythema and swelling with chronic source of the left foot. Prior great toe amputation. Osteomyelitis. EXAM: MRI OF THE LEFT FOREFOOT WITHOUT AND WITH CONTRAST TECHNIQUE: Multiplanar, multisequence MR imaging of the left forefoot was performed both before and after administration of intravenous contrast. CONTRAST:  7.5 cc Gadavist COMPARISON:  Radiographs from 08/02/2018 FINDINGS: Bones/Joint/Cartilage Amputation of the first ray at the Lisfranc joint. Dorsal and proximal dislocation of the proximal phalanx of the second toe with respect to the second metatarsal. There is a large ulceration overlying the second metatarsal head probably with exposed bone, and bony destructive findings in the second metatarsal head with marrow edema and enhancement tracking in the second metatarsal head and shaft, with associated periosteal reaction. There is also appreciable marrow edema and enhancement in the base and proximal half of the proximal phalanx of the second toe which could be reactive or infectious. There is callus formation and focal edema and enhancement in the distal shaft of the third metatarsal, probably from a healing fracture and less likely to be due to active infection. Erosions or degenerative subcortical cyst formation along the Lisfranc joint at the bases of the second and third metatarsals. Ligaments The Lisfranc ligament is thickened. Muscles  and Tendons Edema and enhancement in the plantar musculature of the foot and interosseous muscles which could be neurogenic or due to infectious myositis. Soft tissues Irregular subcutaneous edema and enhancement surrounding the ulcer crater extending to the head of the second metatarsal. Subcutaneous edema and enhancement dorsally in the foot possibly from cellulitis. IMPRESSION: 1. Active osteomyelitis of the second metatarsal, with erosion of the metatarsal head and a large cutaneous ulceration extending to the bony margin. 2. Dorsal and proximal dislocation of the proximal phalanx of the second toe with respect to the second metatarsal head. There is abnormal edema and enhancement in the base of the proximal phalanx which could be infectious or due to arthropathy. 3. Callus formation and periosteal reaction along a segment of the  distal shaft of the third metatarsal, probably from a healing fracture, less likely from chronic infection. 4. Arthropathy along the Lisfranc joint especially at the second and third digits. 5. Generalized muscular edema in the foot could be neurogenic or from infectious myositis. 6. Subcutaneous edema and enhancement in the dorsum of the foot, probably from cellulitis. Or confluence cellulitis medially in the foot around the large ulcer crater. Electronically Signed   By: Van Clines M.D.   On: 08/02/2018 21:22    Scheduled Meds: . aspirin  81 mg Oral Daily  . Chlorhexidine Gluconate Cloth  6 each Topical Q0600  . digoxin  0.125 mg Oral Daily  . docusate sodium  100 mg Oral BID  . furosemide  40 mg Oral Daily  . lisinopril  2.5 mg Oral Daily  . metoprolol succinate  100 mg Oral Daily  . nicotine  14 mg Transdermal Daily  . sodium chloride flush  10-40 mL Intracatheter Q12H   Continuous Infusions: . ceFEPime (MAXIPIME) IV 2 g (08/04/18 0541)  . vancomycin 1,750 mg (08/04/18 0308)    Assessment/Plan:  1. Osteomyelitis of the second metatarsal.  Patient had  amputation yesterday by Dr. Cleda Mccreedy podiatry.  Still having some drainage from that site as per Dr. Sammuel Bailiff note.  Antibiotic beads were placed.  Patient on IV antibiotics with Maxipime and vancomycin.  Podiatry will continue to follow to see if the patient needs another procedure or not.  I will consult infectious disease for choice of IV versus p.o. antibiotics. 2. Chronic atrial fibrillation.  In reviewing cardiology note as outpatient.  The patient does have a history of atrial fibrillation.  On digoxin and metoprolol for rate control.  Patient willing to try aggressive anticoagulation but since the patient may need another procedure I will hold on that now. 3. Chronic systolic heart failure with last echocardiogram showing an EF of 25%.  No signs of heart failure currently.  Patient on metoprolol and lisinopril and Lasix. 4. Essential hypertension.  Continue metoprolol 5. Acute kidney injury on chronic kidney disease stage III.  Continue to monitor with oral diuresis. 6. Tobacco abuse 7. Urinary retention last night.  Start Flomax.  Code Status:     Code Status Orders  (From admission, onward)         Start     Ordered   08/02/18 1503  Full code  Continuous     08/02/18 1502        Code Status History    This patient has a current code status but no historical code status.      Disposition Plan: To be determined based on whether the patient needs IV antibiotics or oral antibiotics upon discharge  Consultants:  Podiatry  Infectious disease  Antibiotics:  Vancomycin  Cefepime  Time spent: 27 minutes  [place]

## 2018-08-04 NOTE — Progress Notes (Signed)
Pt alert and oriented. Denies pain. Dressing to left foot dry and intact. Left foot elevated on pillows. Pt had several episodes of urinary incontinence. Denies pain. No acute distress noted. Staff will continue to monitor pt

## 2018-08-04 NOTE — Evaluation (Addendum)
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  Previous first ray resection. S/p 2nd toe resection 2/2 to presence of osteomyelitis.     Clinical Impression  Patient in bed, awake, alert, and eager to be OOB. Patient able to perform all bed mobility with Mod I/Ind; transfers with Mod I/SBA for safety and appropriate DME use, and ambulation with post-op shoe with SBA and mechanics WFL. Patient able to don/doff post-op shoe without difficulty. Patient requested toileting during session and continues to have difficulty maintaining continence (urinary and fecal) for any length of time after experiencing initial expressed urge. Patient had urinary and fecal incontinence while ambulating 10 feet to the toilet. Patient mentioned that he has worn adult briefs previously, but largely attributes his incontinence to anesthesia. Patient educated on the importance of managing continence issues in order to decrease risk of reinfection on the L foot. Patient demonstrates mild impulsivity and limited safety awareness concerning balance impairments relative to post-surgical status and post-op shoe, but responds very well to minimal verbal cueing as needed. Patient will continue to benefit from skilled therapeutic intervention to address balance, safety, and mobility while admitted and at discharge.  Patient in bed with bed alarm set, phone in reach, all needs met.     Follow Up Recommendations Home health PT    Equipment Recommendations  Standard walker(Patient states that he had a RW previously, but is unsure of his ability to access it presently.)    Recommendations for Other Services       Precautions / Restrictions Precautions Precautions: Fall      Mobility  Bed Mobility Overal bed mobility: Independent                 Transfers Overall transfer level: Modified independent Equipment used: Rolling walker (2 wheeled)             General transfer comment: Patient is mildly impulsive and eager to be OOB. Patient performed sit to stand from an minimally elevated surface with Mod I and min VCs for safety.  Ambulation/Gait Ambulation/Gait assistance: Supervision Gait Distance (Feet): 20 Feet Assistive device: Rolling walker (2 wheeled) Gait Pattern/deviations: WFL(Within Functional Limits);Step-through pattern   Gait velocity interpretation: 1.31 - 2.62 ft/sec, indicative of limited community ambulator General Gait Details: Patient demonstrates gait mechanics WFL and is able to use post-op shoe and maintain forefoot off floor with no cueing. Patient occasionally pushes RW outside of his base of support increasing trunk flexion and presenting a potential safety hazard. Patient able to correct with min cueing.   Stairs            Wheelchair Mobility    Modified Rankin (Stroke Patients Only)       Balance Overall balance assessment: Needs assistance Sitting-balance support: Feet supported Sitting balance-Leahy Scale: Normal     Standing balance support: Single extremity supported;Bilateral upper extremity supported;During functional activity Standing balance-Leahy Scale: Good Standing balance comment: Patient's standing balance is limited 2/2 to post-op shoe and limited WB through L foot. Patient demonstrated no signs of unsteadiness when ambulating with RW and was able to negotiate turns and obstacles with SBA. Patient's impulsivity and decreased safety awareness present limitations.  Pertinent Vitals/Pain Pain Assessment: No/denies pain    Home Living Family/patient expects to be discharged to:: Group home                      Prior Function Level of Independence: Independent with assistive device(s)         Comments:  Patient denies need for assistance with bathing/dressing. Patient used [facility] prior to hospital admission. Patient has 3 meals provided at group home and does not need to clean/cook while there.     Hand Dominance        Extremity/Trunk Assessment   Upper Extremity Assessment Upper Extremity Assessment: Overall WFL for tasks assessed    Lower Extremity Assessment Lower Extremity Assessment: Overall WFL for tasks assessed;LLE deficits/detail LLE Deficits / Details: Unable to fully assess 2/2 to wound dressing; patient has had GT and second toe resected with history of ulcers on L foot.       Communication   Communication: No difficulties  Cognition Arousal/Alertness: Awake/alert Behavior During Therapy: WFL for tasks assessed/performed Overall Cognitive Status: Within Functional Limits for tasks assessed                                        General Comments      Exercises Other Exercises Other Exercises: Functional transfers: OOB, to/from standard height toilet with grab bar, RW, min cueing for safety and hand placement.  Other Exercises: Ambulation in room (2 x10 ft), RW, SBA, post-op shoe. Patient demonstrates acceptable gait mechanics while donning post-op shoe. Other Exercises: Standing balance: BUE supported during hygiene, no LOB, SBA/CGA, RW   Assessment/Plan    PT Assessment Patient needs continued PT services  PT Problem List Decreased mobility;Decreased safety awareness;Decreased knowledge of precautions;Decreased activity tolerance;Decreased balance;Decreased knowledge of use of DME;Decreased skin integrity       PT Treatment Interventions Functional mobility training;DME instruction;Balance training;Patient/family education;Gait training;Therapeutic activities;Neuromuscular re-education;Stair training;Therapeutic exercise    PT Goals (Current goals can be found in the Care Plan section)  Acute Rehab PT Goals Patient Stated Goal: go back to the  group home PT Goal Formulation: With patient Time For Goal Achievement: 08/18/18 Potential to Achieve Goals: Good    Frequency 7X/week   Barriers to discharge        Co-evaluation               AM-PAC PT "6 Clicks" Mobility  Outcome Measure Help needed turning from your back to your side while in a flat bed without using bedrails?: None Help needed moving from lying on your back to sitting on the side of a flat bed without using bedrails?: None Help needed moving to and from a bed to a chair (including a wheelchair)?: A Little Help needed standing up from a chair using your arms (e.g., wheelchair or bedside chair)?: A Little Help needed to walk in hospital room?: A Little Help needed climbing 3-5 steps with a railing? : A Little 6 Click Score: 20    End of Session Equipment Utilized During Treatment: Gait belt Activity Tolerance: Patient tolerated treatment well Patient left: in bed;with bed alarm set;with call bell/phone within reach;with SCD's reapplied   PT Visit Diagnosis: Unsteadiness on feet (R26.81);Other abnormalities of gait and mobility (R26.89)    Time: 1610-9604 PT Time Calculation (min) (ACUTE ONLY): 33 min   Charges:   PT Evaluation $PT Eval Low Complexity: 1  Low PT Treatments $Therapeutic Activity: 8-22 mins       Myles Gip PT, [place] #[id number] 08/04/2018, 12:01 PM

## 2018-08-05 ENCOUNTER — Inpatient Hospital Stay: Payer: Medicare HMO | Admitting: Registered Nurse

## 2018-08-05 ENCOUNTER — Encounter: Payer: Self-pay | Admitting: Emergency Medicine

## 2018-08-05 ENCOUNTER — Encounter
Admission: EM | Disposition: A | Discharge status: Skilled Nursing Facility | Payer: Self-pay | Source: Home / Self Care | Attending: Internal Medicine

## 2018-08-05 HISTORY — PX: IRRIGATION AND DEBRIDEMENT FOOT: SHX6602

## 2018-08-05 LAB — BASIC METABOLIC PANEL
Anion gap: 7 (ref 5–15)
BUN: 23 mg/dL (ref 8–23)
CO2: 24 mmol/L (ref 22–32)
Calcium: 10.1 mg/dL (ref 8.9–10.3)
Chloride: 106 mmol/L (ref 98–111)
Creatinine, Ser: 1.37 mg/dL — ABNORMAL HIGH (ref 0.61–1.24)
GFR calc Af Amer: >60 mL/min (ref >60)
GFR calc non Af Amer: 55 mL/min — ABNORMAL LOW (ref >60)
Glucose, Bld: 107 mg/dL — ABNORMAL HIGH (ref 70–99)
Potassium: 3.9 mmol/L (ref 3.5–5.1)
Sodium: 137 mmol/L (ref 135–145)

## 2018-08-05 LAB — CBC WITH DIFFERENTIAL/PLATELET
Abs Immature Granulocytes: 0.04 10*3/uL (ref 0.00–0.07)
Basophils Absolute: 0.1 10*3/uL (ref 0.0–0.1)
Basophils Relative: 1 %
Eosinophils Absolute: 0.3 10*3/uL (ref 0.0–0.5)
Eosinophils Relative: 2 %
HCT: 37.1 % — ABNORMAL LOW (ref 39.0–52.0)
Hemoglobin: 12.1 g/dL — ABNORMAL LOW (ref 13.0–17.0)
Immature Granulocytes: 0 %
Lymphocytes Relative: 12 %
Lymphs Abs: 1.4 10*3/uL (ref 0.7–4.0)
MCH: 30 pg (ref 26.0–34.0)
MCHC: 32.6 g/dL (ref 30.0–36.0)
MCV: 92.1 fL (ref 80.0–100.0)
Monocytes Absolute: 1.2 10*3/uL — ABNORMAL HIGH (ref 0.1–1.0)
Monocytes Relative: 10 %
Neutro Abs: 8.4 10*3/uL — ABNORMAL HIGH (ref 1.7–7.7)
Neutrophils Relative %: 75 %
Platelets: 279 10*3/uL (ref 150–400)
RBC: 4.03 MIL/uL — ABNORMAL LOW (ref 4.22–5.81)
RDW: 12.8 % (ref 11.5–15.5)
WBC: 11.3 10*3/uL — ABNORMAL HIGH (ref 4.0–10.5)
nRBC: 0 % (ref 0.0–0.2)

## 2018-08-05 LAB — SURGICAL PATHOLOGY

## 2018-08-05 LAB — DIGOXIN LEVEL: Digoxin Level: 0.2 ng/mL — ABNORMAL LOW (ref 0.8–2.0)

## 2018-08-05 SURGERY — IRRIGATION AND DEBRIDEMENT FOOT
Anesthesia: General | Laterality: Left

## 2018-08-05 MED ORDER — OXYCODONE HCL 5 MG PO TABS: 5.0000 mg | ORAL_TABLET | Freq: Once | ORAL | Status: DC | PRN

## 2018-08-05 MED ORDER — FENTANYL CITRATE (PF) 100 MCG/2ML IJ SOLN
INTRAMUSCULAR | Status: DC | PRN
  Administered 2018-08-05: 25 ug via INTRAVENOUS

## 2018-08-05 MED ORDER — FENTANYL CITRATE (PF) 100 MCG/2ML IJ SOLN
INTRAMUSCULAR | Status: AC
  Filled 2018-08-05: qty 2

## 2018-08-05 MED ORDER — ONDANSETRON HCL 4 MG/2ML IJ SOLN
INTRAMUSCULAR | Status: DC | PRN
  Administered 2018-08-05: 4 mg via INTRAVENOUS

## 2018-08-05 MED ORDER — SODIUM CHLORIDE 0.9% FLUSH: 10.0000 mL | INTRAVENOUS | Status: DC | PRN

## 2018-08-05 MED ORDER — VANCOMYCIN HCL 1000 MG IV SOLR
INTRAVENOUS | Status: DC | PRN
  Administered 2018-08-05: 1000 mg

## 2018-08-05 MED ORDER — PROPOFOL 10 MG/ML IV BOLUS
INTRAVENOUS | Status: DC | PRN
  Administered 2018-08-05: 150 mg via INTRAVENOUS
  Administered 2018-08-05: 50 mg via INTRAVENOUS

## 2018-08-05 MED ORDER — LACTATED RINGERS IV SOLN
INTRAVENOUS | Status: DC
  Administered 2018-08-05 – 2018-08-06 (×3): via INTRAVENOUS

## 2018-08-05 MED ORDER — METOPROLOL SUCCINATE ER 25 MG PO TB24
25.0000 mg | ORAL_TABLET | Freq: Every day | ORAL | Status: DC
  Administered 2018-08-06 – 2018-08-08 (×3): 25 mg via ORAL
  Filled 2018-08-05 (×5): qty 1

## 2018-08-05 MED ORDER — FINASTERIDE 5 MG PO TABS
5.0000 mg | ORAL_TABLET | Freq: Every day | ORAL | Status: DC
  Administered 2018-08-05 – 2018-08-09 (×5): 5 mg via ORAL
  Filled 2018-08-05 (×6): qty 1

## 2018-08-05 MED ORDER — LIDOCAINE HCL (CARDIAC) PF 100 MG/5ML IV SOSY
PREFILLED_SYRINGE | INTRAVENOUS | Status: DC | PRN
  Administered 2018-08-05: 100 mg via INTRAVENOUS

## 2018-08-05 MED ORDER — PHENYLEPHRINE HCL (PRESSORS) 10 MG/ML IV SOLN
INTRAVENOUS | Status: DC | PRN
  Administered 2018-08-05 (×2): 100 ug via INTRAVENOUS
  Administered 2018-08-05: 200 ug via INTRAVENOUS
  Administered 2018-08-05 (×3): 100 ug via INTRAVENOUS
  Administered 2018-08-05: 200 ug via INTRAVENOUS

## 2018-08-05 MED ORDER — PROMETHAZINE HCL 25 MG/ML IJ SOLN: 6.2500 mg | INTRAMUSCULAR | Status: DC | PRN

## 2018-08-05 MED ORDER — FENTANYL CITRATE (PF) 100 MCG/2ML IJ SOLN: 25.0000 ug | INTRAMUSCULAR | Status: DC | PRN

## 2018-08-05 MED ORDER — OXYCODONE HCL 5 MG/5ML PO SOLN: 5.0000 mg | Freq: Once | ORAL | Status: DC | PRN

## 2018-08-05 MED ORDER — MEPERIDINE HCL 50 MG/ML IJ SOLN: 6.2500 mg | INTRAMUSCULAR | Status: DC | PRN

## 2018-08-05 MED ORDER — DEXAMETHASONE SODIUM PHOSPHATE 10 MG/ML IJ SOLN
INTRAMUSCULAR | Status: DC | PRN
  Administered 2018-08-05: 5 mg via INTRAVENOUS

## 2018-08-05 MED ORDER — PROPOFOL 10 MG/ML IV BOLUS
INTRAVENOUS | Status: AC
  Filled 2018-08-05: qty 20

## 2018-08-05 SURGICAL SUPPLY — 54 items
"PENCIL ELECTRO HAND CTR " (MISCELLANEOUS) ×1 IMPLANT
BANDAGE ELASTIC 4 LF NS (GAUZE/BANDAGES/DRESSINGS) ×3 IMPLANT
BLADE OSCILLATING/SAGITTAL (BLADE)
BLADE SURG 15 STRL LF DISP TIS (BLADE) ×1 IMPLANT
BLADE SURG 15 STRL SS (BLADE) ×2
BLADE SW THK.38XMED LNG THN (BLADE) IMPLANT
BNDG CONFORM 2 STRL LF (GAUZE/BANDAGES/DRESSINGS) ×3 IMPLANT
BNDG ESMARK 4X12 TAN STRL LF (GAUZE/BANDAGES/DRESSINGS) ×3 IMPLANT
BNDG GAUZE 4.5X4.1 6PLY STRL (MISCELLANEOUS) ×3 IMPLANT
CANISTER SUCT 1200ML W/VALVE (MISCELLANEOUS) ×3 IMPLANT
COVER WAND RF STERILE (DRAPES) ×3 IMPLANT
CUFF TOURN SGL QUICK 12 (TOURNIQUET CUFF) IMPLANT
CUFF TOURN SGL QUICK 18X4 (TOURNIQUET CUFF) ×2 IMPLANT
DRAPE FLUOR MINI C-ARM 54X84 (DRAPES) IMPLANT
DURAPREP 26ML APPLICATOR (WOUND CARE) ×3 IMPLANT
ELECT REM PT RETURN 9FT ADLT (ELECTROSURGICAL) ×3
ELECTRODE REM PT RTRN 9FT ADLT (ELECTROSURGICAL) ×1 IMPLANT
GAUZE SPONGE 4X4 12PLY STRL (GAUZE/BANDAGES/DRESSINGS) ×3 IMPLANT
GAUZE XEROFORM 1X8 LF (GAUZE/BANDAGES/DRESSINGS) ×3 IMPLANT
GLOVE BIO SURGEON STRL SZ7.5 (GLOVE) ×3 IMPLANT
GLOVE INDICATOR 8.0 STRL GRN (GLOVE) ×3 IMPLANT
GOWN STRL REUS W/ TWL LRG LVL3 (GOWN DISPOSABLE) ×2 IMPLANT
GOWN STRL REUS W/TWL LRG LVL3 (GOWN DISPOSABLE) ×4
HANDPIECE VERSAJET DEBRIDEMENT (MISCELLANEOUS) ×3 IMPLANT
KIT STIMULAN RAPID CURE 5CC (Orthopedic Implant) ×2 IMPLANT
KIT TURNOVER KIT A (KITS) ×3 IMPLANT
LABEL OR SOLS (LABEL) ×3 IMPLANT
NDL FILTER BLUNT 18X1 1/2 (NEEDLE) ×1 IMPLANT
NDL HYPO 25X1 1.5 SAFETY (NEEDLE) ×3 IMPLANT
NEEDLE FILTER BLUNT 18X 1/2SAF (NEEDLE) ×2
NEEDLE FILTER BLUNT 18X1 1/2 (NEEDLE) ×1 IMPLANT
NEEDLE HYPO 25X1 1.5 SAFETY (NEEDLE) ×9 IMPLANT
NS IRRIG 500ML POUR BTL (IV SOLUTION) ×3 IMPLANT
PACK EXTREMITY ARMC (MISCELLANEOUS) ×3 IMPLANT
PAD ABD DERMACEA PRESS 5X9 (GAUZE/BANDAGES/DRESSINGS) ×6 IMPLANT
PENCIL ELECTRO HAND CTR (MISCELLANEOUS) ×3 IMPLANT
PULSAVAC PLUS IRRIG FAN TIP (DISPOSABLE) ×3
RASP SM TEAR CROSS CUT (RASP) IMPLANT
SOL PREP PVP 2OZ (MISCELLANEOUS) ×3
SOLUTION PREP PVP 2OZ (MISCELLANEOUS) ×1 IMPLANT
STOCKINETTE 48X4 2 PLY STRL (GAUZE/BANDAGES/DRESSINGS) ×1 IMPLANT
STOCKINETTE STRL 4IN 9604848 (GAUZE/BANDAGES/DRESSINGS) ×3 IMPLANT
STOCKINETTE STRL 6IN 960660 (GAUZE/BANDAGES/DRESSINGS) ×3 IMPLANT
SUT ETHILON 3-0 FS-10 30 BLK (SUTURE) ×3
SUT ETHILON 4-0 (SUTURE) ×2
SUT ETHILON 4-0 FS2 18XMFL BLK (SUTURE) ×1
SUT VIC AB 3-0 SH 27 (SUTURE) ×2
SUT VIC AB 3-0 SH 27X BRD (SUTURE) ×1 IMPLANT
SUT VIC AB 4-0 FS2 27 (SUTURE) ×3 IMPLANT
SUTURE EHLN 3-0 FS-10 30 BLK (SUTURE) ×1 IMPLANT
SUTURE ETHLN 4-0 FS2 18XMF BLK (SUTURE) ×1 IMPLANT
SYR 10ML LL (SYRINGE) ×6 IMPLANT
SYR 3ML LL SCALE MARK (SYRINGE) ×3 IMPLANT
TIP FAN IRRIG PULSAVAC PLUS (DISPOSABLE) IMPLANT

## 2018-08-05 NOTE — Progress Notes (Signed)
Patient ID: Ruben Clark, male   DOB: [birth year], 63 y.o.   MRN: [id number]   [employer] PROGRESS NOTE  Masahiro Iglesia [id number] DOB: [birth year] DOA: 08/02/2018 PCP: System, Pcp Not In  HPI/Subjective: Patient seen this morning.  Offers no complaints.  Patient upset that he may have to go to a rehab facility because of the IV antibiotics.  Objective: Vitals:   08/05/18 1343 08/05/18 1407  BP: 120/84 111/78  Pulse: (!) 107 (!) 45  Resp: 17 (!) 22  Temp:    SpO2: 93% 96%    Filed Weights   08/02/18 1532 08/03/18 1207 08/05/18 1153  Weight: 84.4 kg 84.4 kg 84.4 kg    ROS: Review of Systems  Constitutional: Negative for chills and fever.  Eyes: Negative for blurred vision.  Respiratory: Negative for cough and shortness of breath.   Cardiovascular: Negative for chest pain.  Gastrointestinal: Negative for abdominal pain, constipation, diarrhea, nausea and vomiting.  Genitourinary: Negative for dysuria.  Musculoskeletal: Negative for joint pain.  Neurological: Negative for dizziness and headaches.   Exam: Physical Exam  Constitutional: He is oriented to person, place, and time.  HENT:  Nose: No mucosal edema.  Mouth/Throat: No oropharyngeal exudate or posterior oropharyngeal edema.  Eyes: Pupils are equal, round, and reactive to light. Conjunctivae, EOM and lids are normal.  Neck: No JVD present. Carotid bruit is not present. No edema present. No thyroid mass and no thyromegaly present.  Cardiovascular: S1 normal and S2 normal. An irregularly irregular rhythm present. Exam reveals no gallop.  No murmur heard. Pulses:      Dorsalis pedis pulses are 2+ on the right side and 2+ on the left side.  Respiratory: No respiratory distress. He has no wheezes. He has no rhonchi. He has no rales.  GI: Soft. Bowel sounds are normal. There is no abdominal tenderness.  Musculoskeletal:     Right shoulder: He exhibits no swelling.  Lymphadenopathy:    He has no cervical adenopathy.   Neurological: He is alert and oriented to person, place, and time. No cranial nerve deficit.  Skin: Skin is warm. Nails show no clubbing.  Left foot wrapped.  Psychiatric: He has a normal mood and affect.      Data Reviewed: Basic Metabolic Panel: Recent Labs  Lab 08/02/18 0931 08/03/18 0522 08/04/18 0502 08/05/18 0420  NA 135 139  --  137  K 4.6 4.5  --  3.9  CL 106 110  --  106  CO2 22 23  --  24  GLUCOSE 108* 103*  --  107*  BUN 23 22  --  23  CREATININE 1.68* 1.38* 1.46* 1.37*  CALCIUM 10.4* 10.0  --  10.1   CBC: Recent Labs  Lab 08/02/18 0931 08/03/18 0522 08/04/18 0502 08/05/18 0420  WBC 12.0* 10.6* 13.2* 11.3*  NEUTROABS 8.7*  --  10.3* 8.4*  HGB 13.3 12.2* 11.8* 12.1*  HCT 42.5 38.5* 37.1* 37.1*  MCV 95.7 93.4 93.9 92.1  PLT 259 304 287 279     Recent Results (from the past 240 hour(s))  Culture, blood (routine x 2)     Status: None (Preliminary result)   Collection Time: 08/02/18  9:31 AM  Result Value Ref Range Status   Specimen Description BLOOD RIGHT ANTECUBITAL  Final   Special Requests   Final    BOTTLES DRAWN AEROBIC AND ANAEROBIC Blood Culture adequate volume   Culture   Final    NO GROWTH 3 DAYS Performed at [facility]  [place], [place] [address]    Report Status PENDING  Incomplete  Culture, blood (routine x 2)     Status: None (Preliminary result)   Collection Time: 08/02/18  9:36 AM  Result Value Ref Range Status   Specimen Description BLOOD BLOOD RIGHT HAND  Final   Special Requests   Final    BOTTLES DRAWN AEROBIC AND ANAEROBIC Blood Culture adequate volume   Culture   Final    NO GROWTH 3 DAYS Performed at [facility], [address] [place], [place] [address]    Report Status PENDING  Incomplete  Aerobic/Anaerobic Culture (surgical/deep wound)     Status: None (Preliminary result)   Collection Time: 08/03/18  1:05 PM  Result Value Ref Range Status   Specimen Description   Final    TOE  LEFT GREAT TOE Performed at [facility], [address] [place], [place] [address]    Special Requests   Final    NONE Performed at [facility], [address] [place], [place] [address]    Gram Stain   Final    FEW WBC PRESENT, PREDOMINANTLY PMN NO ORGANISMS SEEN    Culture   Final    CULTURE REINCUBATED FOR BETTER GROWTH Performed at [place] [address] [place], [place] [address]    Report Status PENDING  Incomplete     Studies: [place] Ekg Site Rite  Result Date: 08/04/2018 If Site Rite image not attached, placement could not be confirmed due to current cardiac rhythm.   Scheduled Meds: . [MAR Hold] aspirin  81 mg Oral Daily  . [MAR Hold] Chlorhexidine Gluconate Cloth  6 each Topical Q0600  . [MAR Hold] digoxin  0.125 mg Oral Daily  . [MAR Hold] docusate sodium  100 mg Oral BID  . [MAR Hold] furosemide  40 mg Oral Daily  . [MAR Hold] lisinopril  2.5 mg Oral Daily  . metoprolol succinate  25 mg Oral Daily  . [MAR Hold] nicotine  14 mg Transdermal Daily  . [MAR Hold] sodium chloride flush  10-40 mL Intracatheter Q12H  . [MAR Hold] tamsulosin  0.4 mg Oral QPC supper   Continuous Infusions: . [MAR Hold] ceFEPime (MAXIPIME) IV 2 g (08/05/18 0651)  . lactated ringers    . [MAR Hold] vancomycin 1,750 mg (08/05/18 0409)    Assessment/Plan:  1. Osteomyelitis of the second metatarsal.  Patient had amputation yesterday by Dr. Cleda Mccreedy podiatry.  Since the patient had drainage from the left foot today he was brought back to the operating room by Dr. Caryl Comes.  Please see operative report. Patient on IV antibiotics with Maxipime and vancomycin.  Infectious disease doctor did choose antibiotics prior to disposition.  Social worker to look into rehabs for IV antibiotics.  PICC line ordered.  COVID-19 testing ordered because he may go to a facility (not because he has symptoms). 2. Chronic atrial fibrillation.  In reviewing cardiology note as outpatient.   The patient does have a history of atrial fibrillation.  On digoxin and metoprolol for rate control.  Patient willing to try aggressive anticoagulation but I will hold on that now secondary to second surgical procedure. 3. Chronic systolic heart failure with last echocardiogram showing an EF of 25%.  No signs of heart failure currently.  Patient on metoprolol and lisinopril and Lasix. 4. Relative hypotension.  On low-dose lisinopril.  Decrease dose of metoprolol down to 25 mg daily 5. Acute kidney injury on chronic kidney  disease stage III.  Continue to monitor with oral diuresis. 6. Tobacco abuse 7. Urinary retention after the prior surgical procedure.  With blood pressure being a little on the lower side I will discontinue Flomax since the patient has been going to the bathroom and I will use Proscar instead.  Code Status:     Code Status Orders  (From admission, onward)         Start     Ordered   08/02/18 1503  Full code  Continuous     08/02/18 1502        Code Status History    This patient has a current code status but no historical code status.      Disposition Plan: Will need rehab with IV antibiotics  Consultants:  Podiatry  Infectious disease  Antibiotics:  Vancomycin  Cefepime  Time spent: 28 minutes, case discussed with Dr. Cleda Mccreedy podiatry and social work team  [place]

## 2018-08-05 NOTE — Interval H&P Note (Signed)
History and Physical Interval Note:  08/05/2018 12:07 PM  [name]  has presented today for surgery, with the diagnosis of left foot infection.  The various methods of treatment have been discussed with the patient and family. After consideration of risks, benefits and other options for treatment, the patient has consented to  Procedure(s): IRRIGATION AND DEBRIDEMENT FOOT (Left) as a surgical intervention.  The patient's history has been reviewed, patient examined, no change in status, stable for surgery.  I have reviewed the patient's chart and labs.  Questions were answered to the patient's satisfaction.     Ruben Clark

## 2018-08-05 NOTE — Progress Notes (Signed)
2 Days Post-Op   Subjective/Chief Complaint: Patient seen.  No complaints.   Objective: Vital signs in last 24 hours: Temp:  [97.6 F (36.4 C)-98.1 F (36.7 C)] 97.9 F (36.6 C) (04/30 0741) Pulse Rate:  [69-84] 84 (04/30 0741) Resp:  [16-20] 16 (04/30 0538) BP: (97-120)/(60-90) 103/81 (04/30 0741) SpO2:  [97 %-98 %] 97 % (04/30 0741) Last BM Date: 08/04/18  Intake/Output from previous day: 04/29 0701 - 04/30 0700 In: 1520 [P.O.:720; IV Piggyback:800] Out: 651 [Urine:651] Intake/Output this shift: No intake/output data recorded.  Still some mild bleeding on the bandage.  Erythema is improved but still some present distally.  Incision well coapted but there is still some expressible drainage with mild purulent appearance.  Lab Results:  Recent Labs    08/04/18 0502 08/05/18 0420  WBC 13.2* 11.3*  HGB 11.8* 12.1*  HCT 37.1* 37.1*  PLT 287 279   BMET Recent Labs    08/03/18 0522 08/04/18 0502 08/05/18 0420  NA 139  --  137  K 4.5  --  3.9  CL 110  --  106  CO2 23  --  24  GLUCOSE 103*  --  107*  BUN 22  --  23  CREATININE 1.38* 1.46* 1.37*  CALCIUM 10.0  --  10.1   PT/INR No results for input(s): LABPROT, INR in the last 72 hours. ABG No results for input(s): PHART, HCO3 in the last 72 hours.  Invalid input(s): PCO2, PO2  Studies/Results: [place] Ekg Site Rite  Result Date: 08/04/2018 If Site Rite image not attached, placement could not be confirmed due to current cardiac rhythm.   Anti-infectives: Anti-infectives (From admission, onward)   Start     Dose/Rate Route Frequency Ordered Stop   08/04/18 0300  vancomycin (VANCOCIN) 1,750 mg in sodium chloride 0.9 % 500 mL IVPB     1,750 mg 250 mL/hr over 120 Minutes Intravenous Every 24 hours 08/03/18 0723     08/03/18 1400  ceFEPIme (MAXIPIME) 2 g in sodium chloride 0.9 % 100 mL IVPB     2 g 200 mL/hr over 30 Minutes Intravenous Every 8 hours 08/03/18 0724     08/03/18 0300  vancomycin (VANCOCIN) 1,500 mg  in sodium chloride 0.9 % 500 mL IVPB  Status:  Discontinued     1,500 mg 250 mL/hr over 120 Minutes Intravenous Every 24 hours 08/02/18 1459 08/03/18 0723   08/02/18 1700  vancomycin (VANCOCIN) IVPB 1000 mg/200 mL premix     1,000 mg 200 mL/hr over 60 Minutes Intravenous  Once 08/02/18 1459 08/02/18 1727   08/02/18 1045  vancomycin (VANCOCIN) IVPB 1000 mg/200 mL premix     1,000 mg 200 mL/hr over 60 Minutes Intravenous  Once 08/02/18 1036 08/02/18 1605   08/02/18 1045  ceFEPIme (MAXIPIME) 2 g in sodium chloride 0.9 % 100 mL IVPB     2 g 200 mL/hr over 30 Minutes Intravenous  Once 08/02/18 1036 08/02/18 1500   08/02/18 0300  ceFEPIme (MAXIPIME) 2 g in sodium chloride 0.9 % 100 mL IVPB  Status:  Discontinued     2 g 200 mL/hr over 30 Minutes Intravenous Every 12 hours 08/02/18 1459 08/03/18 0724      Assessment/Plan: s/p Procedure(s): AMPUTATION RAY SECOND TOE (Left) Assessment: Status post debridement left foot with some mild continued infection.   Plan: Dressing reapplied to the left foot.  Discussed with the patient the need for re-debridement to irrigate the wound again.  Discussed placing antibiotic beads into the wound.  Consent form to be obtained.  Discussed risks and possible complications of the procedure and anesthesia including inability to heal due to the infection and the need for further surgeries.  Plan for surgery around noon today.  LOS: 3 days    Durward Fortes 08/05/2018

## 2018-08-05 NOTE — Care Management Important Message (Signed)
Important Message  Patient Details  Name: Ruben Clark MRN: [id number] Date of Birth: 10-09-1955   Medicare Important Message Given:  Yes    Juliann Pulse A Luz Burcher 08/05/2018, 2:01 PM

## 2018-08-05 NOTE — Progress Notes (Signed)
Pts BP 103/68, HR 88. Pt has scheduled 25 mg metoprolol. MD Leslye Peer notified, Per MD hold dose.

## 2018-08-05 NOTE — Anesthesia Preprocedure Evaluation (Signed)
Anesthesia Evaluation  Patient identified by MRN, date of birth, ID band Patient awake    Reviewed: Allergy & Precautions, NPO status , Patient's Chart, lab work & pertinent test results  History of Anesthesia Complications Negative for: history of anesthetic complications  Airway Mallampati: II  TM Distance: >3 FB Neck ROM: Full    Dental  (+) Poor Dentition, Missing   Pulmonary neg sleep apnea, COPD, Current Smoker,    breath sounds clear to auscultation- rhonchi (-) wheezing      Cardiovascular hypertension, Pt. on medications +CHF (EF 25%)  (-) CAD, (-) Past MI, (-) Cardiac Stents and (-) CABG + dysrhythmias (hx of afib)  Rhythm:Regular Rate:Normal - Systolic murmurs and - Diastolic murmurs Echo 50/53/97: There is mild concentric left ventricular hypertrophy.  The overall left ventricular systolic function is estimated at 25%.  Right ventricle is normal in size with reduced systolic function.  The left atrium is severely dilated.  The right atrium is dilated.  The IVC is dilated in size with less than 50% collapse during inspiration.   Neuro/Psych neg Seizures negative neurological ROS  negative psych ROS   GI/Hepatic negative GI ROS, Neg liver ROS,   Endo/Other  negative endocrine ROSneg diabetes  Renal/GU negative Renal ROS     Musculoskeletal negative musculoskeletal ROS (+)   Abdominal (+) - obese,   Peds  Hematology negative hematology ROS (+)   Anesthesia Other Findings Past Medical History: No date: CHF (congestive heart failure) (HCC) No date: Hypertension   Reproductive/Obstetrics                             Anesthesia Physical  Anesthesia Plan  ASA: III  Anesthesia Plan: General   Post-op Pain Management:    Induction: Intravenous  PONV Risk Score and Plan: 0 and Ondansetron  Airway Management Planned: LMA  Additional Equipment:   Intra-op Plan:    Post-operative Plan:   Informed Consent: I have reviewed the patients History and Physical, chart, labs and discussed the procedure including the risks, benefits and alternatives for the proposed anesthesia with the patient or authorized representative who has indicated his/her understanding and acceptance.     Dental advisory given  Plan Discussed with: CRNA and Anesthesiologist  Anesthesia Plan Comments:         Anesthesia Quick Evaluation

## 2018-08-05 NOTE — Anesthesia Post-op Follow-up Note (Signed)
Anesthesia QCDR form completed.        

## 2018-08-05 NOTE — Anesthesia Procedure Notes (Signed)
Procedure Name: LMA Insertion Date/Time: 08/05/2018 12:31 PM Performed by: Aline Brochure, CRNA Pre-anesthesia Checklist: Patient identified, Emergency Drugs available, Suction available and Patient being monitored Patient Re-evaluated:Patient Re-evaluated prior to induction Oxygen Delivery Method: Circle system utilized Preoxygenation: Pre-oxygenation with 100% oxygen Induction Type: IV induction Ventilation: Mask ventilation without difficulty LMA: LMA inserted LMA Size: 4.5 Placement Confirmation: positive ETCO2 and breath sounds checked- equal and bilateral Tube secured with: Tape Dental Injury: Teeth and Oropharynx as per pre-operative assessment

## 2018-08-05 NOTE — Progress Notes (Signed)
Peripherally Inserted Central Catheter/Midline Placement  The IV Nurse has discussed with the patient and/or persons authorized to consent for the patient, the purpose of this procedure and the potential benefits and risks involved with this procedure.  The benefits include less needle sticks, lab draws from the catheter, and the patient may be discharged home with the catheter. Risks include, but not limited to, infection, bleeding, blood clot (thrombus formation), and puncture of an artery; nerve damage and irregular heartbeat and possibility to perform a PICC exchange if needed/ordered by physician.  Alternatives to this procedure were also discussed.  Bard Power PICC patient education guide, fact sheet on infection prevention and patient information card has been provided to patient /or left at bedside.    PICC/Midline Placement Documentation  PICC Single Lumen 61/22/44 PICC Left Basilic 45 cm 0 cm (Active)  Indication for Insertion or Continuance of Line Home intravenous therapies (PICC only) 08/05/2018 11:00 AM  Exposed Catheter (cm) 0 cm 08/05/2018 11:00 AM  Site Assessment Clean;Dry;Intact 08/05/2018 11:00 AM  Line Status Flushed;Saline locked;Blood return noted 08/05/2018 11:00 AM  Dressing Type Transparent;Securing device 08/05/2018 11:00 AM  Dressing Status Clean;Dry;Intact;Antimicrobial disc in place 08/05/2018 11:00 AM  Line Care Connections checked and tightened 08/05/2018 11:00 AM  Dressing Intervention New dressing 08/05/2018 11:00 AM  Dressing Change Due 08/12/18 08/05/2018 11:00 AM       Virgilio Belling 08/05/2018, 11:30 AM

## 2018-08-05 NOTE — Progress Notes (Signed)
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  Previous first ray resection. S/p 2nd toe resection 2/2 to presence of osteomyelitis.     PT Comments    Patient able to demonstrate good ind with bed mobility, modI with STS with cuing initially for set up/hand placement. Patient ambulated 156ft with some fatigue following. Throughout ambulation patietn is very focused on heel strike which he is able to complete well, PT cued patient for increased step length and safety with RW with amb which patient is able to complete well following cuing.   Follow Up Recommendations  Home health PT     Equipment Recommendations  Standard walker    Recommendations for Other Services       Precautions / Restrictions Precautions Precautions: Fall Restrictions Weight Bearing Restrictions: No    Mobility  Bed Mobility                  Transfers                    Ambulation/Gait                 Stairs             Wheelchair Mobility    Modified Rankin (Stroke Patients Only)       Balance                                            Cognition                                              Exercises Other Exercises Other Exercises: Patient able to demonstrate safety with STS following cuing for hand placement/set up Other Exercises: Patient ambulated with RW, and post op shoe, 181ft with consistent cuing to increase step length. Patient very focused on heel strike, which he is able to complete well.     General Comments        Pertinent Vitals/Pain Pain Assessment: No/denies pain    Home Living                      Prior Function            PT Goals (current goals can now be found in the care plan section) Acute  Rehab PT Goals Patient Stated Goal: go back to the group home PT Goal Formulation: With patient Time For Goal Achievement: 08/18/18 Potential to Achieve Goals: Good Progress towards PT goals: Progressing toward goals    Frequency    7X/week      PT Plan      Co-evaluation              AM-PAC PT "6 Clicks" Mobility   Outcome Measure  Help needed turning from your back to your side while in a flat bed without using bedrails?: None Help needed moving from lying on your back to sitting on the side of a flat bed without using bedrails?: None Help needed moving to and from a bed to a chair (including a wheelchair)?: A  Little Help needed standing up from a chair using your arms (e.g., wheelchair or bedside chair)?: A Little Help needed to walk in hospital room?: A Little Help needed climbing 3-5 steps with a railing? : A Little 6 Click Score: 20    End of Session Equipment Utilized During Treatment: Gait belt Activity Tolerance: Patient tolerated treatment well Patient left: in bed;with bed alarm set;with call bell/phone within reach;with SCD's reapplied   PT Visit Diagnosis: Unsteadiness on feet (R26.81);Other abnormalities of gait and mobility (R26.89)     Time: 1712-7871 PT Time Calculation (min) (ACUTE ONLY): 12 min  Charges:  $Gait Training: 8-22 mins                    Shelton Silvas PT, DPT  [place] 08/05/2018, 11:50 AM

## 2018-08-05 NOTE — Anesthesia Postprocedure Evaluation (Signed)
Anesthesia Post Note  Patient: Ruben Clark  Procedure(s) Performed: IRRIGATION AND DEBRIDEMENT FOOT (Left )  Patient location during evaluation: PACU Anesthesia Type: General Level of consciousness: awake and alert and oriented Pain management: pain level controlled Vital Signs Assessment: post-procedure vital signs reviewed and stable Respiratory status: spontaneous breathing, nonlabored ventilation and respiratory function stable Cardiovascular status: blood pressure returned to baseline and stable Postop Assessment: no signs of nausea or vomiting Anesthetic complications: no     Last Vitals:  Vitals:   08/05/18 1338 08/05/18 1343  BP:  120/84  Pulse: (!) 103 (!) 107  Resp: 15 17  Temp: 36.4 C   SpO2: 94% 93%    Last Pain:  Vitals:   08/05/18 1338  TempSrc:   PainSc: 0-No pain                 Drisana Schweickert

## 2018-08-05 NOTE — Transfer of Care (Signed)
Immediate Anesthesia Transfer of Care Note  Patient: Ruben Clark  Procedure(s) Performed: IRRIGATION AND DEBRIDEMENT FOOT (Left )  Patient Location: PACU  Anesthesia Type:General  Level of Consciousness: awake, alert  and oriented  Airway & Oxygen Therapy: Patient connected to face mask oxygen  Post-op Assessment: Post -op Vital signs reviewed and stable  Post vital signs: stable  Last Vitals:  Vitals Value Taken Time  BP 110/66 08/05/2018  1:13 PM  Temp 36.4 C 08/05/2018  1:13 PM  Pulse 92 08/05/2018  1:13 PM  Resp 17 08/05/2018  1:13 PM  SpO2 100 % 08/05/2018  1:13 PM  Vitals shown include unvalidated device data.  Last Pain:  Vitals:   08/05/18 1153  TempSrc: Temporal  PainSc: 0-No pain         Complications: No apparent anesthesia complications

## 2018-08-05 NOTE — Op Note (Signed)
Date of operation 08/05/2018.  Surgeon: Durward Fortes D.P.M.  Preoperative diagnosis: Continued infection status post left second ray amputation.  Postoperative diagnosis: Same.  Procedure: Multiple area I&D left foot with placement of antibiotic beads.  Anesthesia: LMA.  Hemostasis: None.  Estimated blood loss: 10 to 15 cc.  Implants: Stimulan rapid cure antibiotic beads impregnated with vancomycin.  Complications: None apparent.  Operative indications: This is a 63 year old male with recent second ray amputation due to osteomyelitis of the second metatarsal.  Postoperatively had some purulence with the drainage from the incision area with a continued elevated white count so decision was made for re-I&D as well as placement of antibiotic beads.  Operative procedure: Patient was taken to the operating room and placed on the table in the supine position.  Following satisfactory LMA anesthesia the left foot was prepped and draped in the usual sterile fashion.  Attention was then directed to the previous surgical site where all sutures were removed from the dorsal and plantar incision areas.  The wound was opened with hemostats and gross debridement of devitalized tissue and hematoma was performed using a rongeur throughout all levels including through the deep fascia where the previous bone was located.  The wound was then debrided with a versa jet debrider thoroughly on a setting of 4.  The wound was then thoroughly irrigated using a pulsed irrigation with 3 L of saline.  Stimulan rapid cure antibiotic beads impregnated with vancomycin were then placed into the wound and the dorsal incision and plantar wound were then reapproximated using 3-0 nylon simple interrupted sutures.  Xeroform 4 x 4's ABD Kerlix and an Ace wrap applied to the left lower extremity.  The patient tolerated the procedure and anesthesia well and was transported to the PACU with vital signs stable and in good condition.

## 2018-08-05 NOTE — H&P (View-Only) (Signed)
2 Days Post-Op   Subjective/Chief Complaint: Patient seen.  No complaints.   Objective: Vital signs in last 24 hours: Temp:  [97.6 F (36.4 C)-98.1 F (36.7 C)] 97.9 F (36.6 C) (04/30 0741) Pulse Rate:  [69-84] 84 (04/30 0741) Resp:  [16-20] 16 (04/30 0538) BP: (97-120)/(60-90) 103/81 (04/30 0741) SpO2:  [97 %-98 %] 97 % (04/30 0741) Last BM Date: 08/04/18  Intake/Output from previous day: 04/29 0701 - 04/30 0700 In: 1520 [P.O.:720; IV Piggyback:800] Out: 651 [Urine:651] Intake/Output this shift: No intake/output data recorded.  Still some mild bleeding on the bandage.  Erythema is improved but still some present distally.  Incision well coapted but there is still some expressible drainage with mild purulent appearance.  Lab Results:  Recent Labs    08/04/18 0502 08/05/18 0420  WBC 13.2* 11.3*  HGB 11.8* 12.1*  HCT 37.1* 37.1*  PLT 287 279   BMET Recent Labs    08/03/18 0522 08/04/18 0502 08/05/18 0420  NA 139  --  137  K 4.5  --  3.9  CL 110  --  106  CO2 23  --  24  GLUCOSE 103*  --  107*  BUN 22  --  23  CREATININE 1.38* 1.46* 1.37*  CALCIUM 10.0  --  10.1   PT/INR No results for input(s): LABPROT, INR in the last 72 hours. ABG No results for input(s): PHART, HCO3 in the last 72 hours.  Invalid input(s): PCO2, PO2  Studies/Results: [place] Ekg Site Rite  Result Date: 08/04/2018 If Site Rite image not attached, placement could not be confirmed due to current cardiac rhythm.   Anti-infectives: Anti-infectives (From admission, onward)   Start     Dose/Rate Route Frequency Ordered Stop   08/04/18 0300  vancomycin (VANCOCIN) 1,750 mg in sodium chloride 0.9 % 500 mL IVPB     1,750 mg 250 mL/hr over 120 Minutes Intravenous Every 24 hours 08/03/18 0723     08/03/18 1400  ceFEPIme (MAXIPIME) 2 g in sodium chloride 0.9 % 100 mL IVPB     2 g 200 mL/hr over 30 Minutes Intravenous Every 8 hours 08/03/18 0724     08/03/18 0300  vancomycin (VANCOCIN) 1,500 mg  in sodium chloride 0.9 % 500 mL IVPB  Status:  Discontinued     1,500 mg 250 mL/hr over 120 Minutes Intravenous Every 24 hours 08/02/18 1459 08/03/18 0723   08/02/18 1700  vancomycin (VANCOCIN) IVPB 1000 mg/200 mL premix     1,000 mg 200 mL/hr over 60 Minutes Intravenous  Once 08/02/18 1459 08/02/18 1727   08/02/18 1045  vancomycin (VANCOCIN) IVPB 1000 mg/200 mL premix     1,000 mg 200 mL/hr over 60 Minutes Intravenous  Once 08/02/18 1036 08/02/18 1605   08/02/18 1045  ceFEPIme (MAXIPIME) 2 g in sodium chloride 0.9 % 100 mL IVPB     2 g 200 mL/hr over 30 Minutes Intravenous  Once 08/02/18 1036 08/02/18 1500   08/02/18 0300  ceFEPIme (MAXIPIME) 2 g in sodium chloride 0.9 % 100 mL IVPB  Status:  Discontinued     2 g 200 mL/hr over 30 Minutes Intravenous Every 12 hours 08/02/18 1459 08/03/18 0724      Assessment/Plan: s/p Procedure(s): AMPUTATION RAY SECOND TOE (Left) Assessment: Status post debridement left foot with some mild continued infection.   Plan: Dressing reapplied to the left foot.  Discussed with the patient the need for re-debridement to irrigate the wound again.  Discussed placing antibiotic beads into the wound.  Consent form to be obtained.  Discussed risks and possible complications of the procedure and anesthesia including inability to heal due to the infection and the need for further surgeries.  Plan for surgery around noon today.  LOS: 3 days    Durward Fortes 08/05/2018

## 2018-08-06 LAB — CBC WITH DIFFERENTIAL/PLATELET
Abs Immature Granulocytes: 0.06 10*3/uL (ref 0.00–0.07)
Basophils Absolute: 0 10*3/uL (ref 0.0–0.1)
Basophils Relative: 0 %
Eosinophils Absolute: 0 10*3/uL (ref 0.0–0.5)
Eosinophils Relative: 0 %
HCT: 36 % — ABNORMAL LOW (ref 39.0–52.0)
Hemoglobin: 11.8 g/dL — ABNORMAL LOW (ref 13.0–17.0)
Immature Granulocytes: 1 %
Lymphocytes Relative: 7 %
Lymphs Abs: 0.9 10*3/uL (ref 0.7–4.0)
MCH: 29.9 pg (ref 26.0–34.0)
MCHC: 32.8 g/dL (ref 30.0–36.0)
MCV: 91.4 fL (ref 80.0–100.0)
Monocytes Absolute: 1.1 10*3/uL — ABNORMAL HIGH (ref 0.1–1.0)
Monocytes Relative: 8 %
Neutro Abs: 10.9 10*3/uL — ABNORMAL HIGH (ref 1.7–7.7)
Neutrophils Relative %: 84 %
Platelets: 297 10*3/uL (ref 150–400)
RBC: 3.94 MIL/uL — ABNORMAL LOW (ref 4.22–5.81)
RDW: 12.7 % (ref 11.5–15.5)
WBC: 13 10*3/uL — ABNORMAL HIGH (ref 4.0–10.5)
nRBC: 0 % (ref 0.0–0.2)

## 2018-08-06 LAB — VANCOMYCIN, PEAK: Vancomycin Pk: 38 ug/mL (ref 30–40)

## 2018-08-06 LAB — CREATININE, SERUM
Creatinine, Ser: 1.31 mg/dL — ABNORMAL HIGH (ref 0.61–1.24)
GFR calc Af Amer: >60 mL/min (ref >60)
GFR calc non Af Amer: 58 mL/min — ABNORMAL LOW (ref >60)

## 2018-08-06 MED ORDER — SODIUM CHLORIDE 0.9 % IV SOLN
INTRAVENOUS | Status: DC | PRN
  Administered 2018-08-06: 14:00:00 250 mL via INTRAVENOUS

## 2018-08-06 MED ORDER — PIPERACILLIN-TAZOBACTAM 3.375 G IVPB
3.3750 g | Freq: Three times a day (TID) | INTRAVENOUS | Status: DC
  Administered 2018-08-06 – 2018-08-09 (×9): 3.375 g via INTRAVENOUS
  Filled 2018-08-06 (×9): qty 50

## 2018-08-06 NOTE — Progress Notes (Signed)
Patient ID: Ruben Clark, male   DOB: [birth year], 63 y.o.   MRN: [id number]  Notified the group home will not be able to take with antibiotics.  Will need rehab.  COVID-19 testing still pending.  Will likely be negative.  Potentially out to rehab tomorrow.  Dr Loletha Grayer

## 2018-08-06 NOTE — Progress Notes (Addendum)
Infectious Disease Long Term IV Antibiotic Orders Chasin Findling November 08, 1955  Diagnosis: Foot wound and osteomyelitis, s.p surgery x 2. Cultures pending but NG after 4 days  Culture results Results for orders placed or performed during the hospital encounter of 08/02/18  Culture, blood (routine x 2)     Status: None (Preliminary result)   Collection Time: 08/02/18  9:31 AM  Result Value Ref Range Status   Specimen Description BLOOD RIGHT ANTECUBITAL  Final   Special Requests   Final    BOTTLES DRAWN AEROBIC AND ANAEROBIC Blood Culture adequate volume   Culture   Final    NO GROWTH 4 DAYS Performed at [facility], [address] [place], [place] [address]    Report Status PENDING  Incomplete  Culture, blood (routine x 2)     Status: None (Preliminary result)   Collection Time: 08/02/18  9:36 AM  Result Value Ref Range Status   Specimen Description BLOOD BLOOD RIGHT HAND  Final   Special Requests   Final    BOTTLES DRAWN AEROBIC AND ANAEROBIC Blood Culture adequate volume   Culture   Final    NO GROWTH 4 DAYS Performed at [facility], [address] [place], [place] [address]    Report Status PENDING  Incomplete  Aerobic/Anaerobic Culture (surgical/deep wound)     Status: None (Preliminary result)   Collection Time: 08/03/18  1:05 PM  Result Value Ref Range Status   Specimen Description   Final    TOE LEFT GREAT TOE Performed at [facility], [address], [place], [place] [address]    Special Requests   Final    NONE Performed at [facility], [address] [place], [place] [address]    Gram Stain   Final    FEW WBC PRESENT, PREDOMINANTLY PMN NO ORGANISMS SEEN    Culture   Final    CULTURE REINCUBATED FOR BETTER GROWTH HOLDING FOR POSSIBLE ANAEROBE Performed at [facility], [place] [address], [place], [place] [address]    Report Status PENDING  Incomplete    LABS Lab Results  Component Value Date   CREATININE 1.31  (H) 08/06/2018   Lab Results  Component Value Date   WBC 13.0 (H) 08/06/2018   HGB 11.8 (L) 08/06/2018   HCT 36.0 (L) 08/06/2018   MCV 91.4 08/06/2018   PLT 297 08/06/2018   Lab Results  Component Value Date   ESRSEDRATE 56 (H) 08/04/2018   Lab Results  Component Value Date   CRP 6.4 (H) 08/02/2018    Allergies: No Known Allergies  Discharge antibiotics Zosyn  3.375 grams every 6 hours   PICC Care per protocol Labs weekly while on IV antibiotics -FAX weekly labs to [phone] CBC w diff   Comprehensive met panel CRP   Planned duration of antibiotics 4 weeks post op  Stop date 5/28 Follow up clinic date 2-3 weeks with Dr Brooke Bonito   Leonel Ramsay, MD

## 2018-08-06 NOTE — TOC Transition Note (Addendum)
Transition of Care [facility]) - CM/SW Discharge Note   Patient Details  Name: Ruben Clark MRN: [id number] Date of Birth: 1956/01/05  [facility] CM/SW Contact:  Latanya Maudlin, RN Phone Number: 08/06/2018, 1:14 PM   Clinical Narrative:   Patient will likely need to leave with IV abx per ID MD. PICC line In place. Notified Pam from [facility] of need for home antibiotics. It is unknown if facility can assist with antibiotics. Spoke with Tammy at [facility] group home regarding the case. Notified Corene Cornea of home health RN/Pt referral. Rolling walker in facility already. Follow up appointment made with Dr Ginette Pitman for new PCP referral. TOC tem will continue to follow.   Group home unable to take IV abx. Will complete FL2 and send out bed referrals.     Final next level of care: [place] Barriers to Discharge: Continued Medical Work up   Patient Goals and CMS Choice Patient states their goals for this hospitalization and ongoing recovery are:: get out of here and get foot fixed CMS Medicare.gov Compare Post Acute Care list provided to:: Patient    Discharge Placement                       Discharge Plan and Services   Discharge Planning Services: CM Consult            DME Arranged: (none needed)         HH Arranged: RN, IV Antibiotics, PT HH Agency: [facility] Date [place]: 08/06/18 Time [place]: [id number] Representative spoke with at [place]: Corene Cornea from [place] with infusion  Social Determinants of Health ([place]) Interventions     Readmission Risk Interventions No flowsheet data found.

## 2018-08-06 NOTE — Progress Notes (Signed)
Patient ID: Ruben Clark, male   DOB: 08/02/1955, 63 y.o.   MRN: [id number]   [facility] PROGRESS NOTE  Ruben Clark KXF:[id number] DOB: [birth year] DOA: 08/02/2018 PCP: System, Pcp Not In  HPI/Subjective: Patient feels okay.  Offers no complaints.  He is hoping not to be in rehab too long.  Objective: Vitals:   08/06/18 0349 08/06/18 0728  BP: 124/82 (!) 124/94  Pulse: 74 66  Resp: 19 17  Temp:  97.8 F (36.6 C)  SpO2: 100% 99%    Filed Weights   08/02/18 1532 08/03/18 1207 08/05/18 1153  Weight: 84.4 kg 84.4 kg 84.4 kg    ROS: Review of Systems  Constitutional: Negative for chills and fever.  Eyes: Negative for blurred vision.  Respiratory: Negative for cough and shortness of breath.   Cardiovascular: Negative for chest pain.  Gastrointestinal: Negative for abdominal pain, constipation, diarrhea, nausea and vomiting.  Genitourinary: Negative for dysuria.  Musculoskeletal: Negative for joint pain.  Neurological: Negative for dizziness and headaches.   Exam: Physical Exam  Constitutional: He is oriented to person, place, and time.  HENT:  Nose: No mucosal edema.  Mouth/Throat: No oropharyngeal exudate or posterior oropharyngeal edema.  Eyes: Pupils are equal, round, and reactive to light. Conjunctivae, EOM and lids are normal.  Neck: No JVD present. Carotid bruit is not present. No edema present. No thyroid mass and no thyromegaly present.  Cardiovascular: S1 normal and S2 normal. An irregularly irregular rhythm present. Exam reveals no gallop.  No murmur heard. Pulses:      Dorsalis pedis pulses are 2+ on the right side and 2+ on the left side.  Respiratory: No respiratory distress. He has no wheezes. He has no rhonchi. He has no rales.  GI: Soft. Bowel sounds are normal. There is no abdominal tenderness.  Musculoskeletal:     Right shoulder: He exhibits no swelling.  Lymphadenopathy:    He has no cervical adenopathy.  Neurological: He is alert and oriented to  person, place, and time. No cranial nerve deficit.  Skin: Skin is warm. Nails show no clubbing.  Left foot wrapped.  Psychiatric: He has a normal mood and affect.      Data Reviewed: Basic Metabolic Panel: Recent Labs  Lab 08/02/18 0931 08/03/18 0522 08/04/18 0502 08/05/18 0420 08/06/18 0630  NA 135 139  --  137  --   K 4.6 4.5  --  3.9  --   CL 106 110  --  106  --   CO2 22 23  --  24  --   GLUCOSE 108* 103*  --  107*  --   BUN 23 22  --  23  --   CREATININE 1.68* 1.38* 1.46* 1.37* 1.31*  CALCIUM 10.4* 10.0  --  10.1  --    CBC: Recent Labs  Lab 08/02/18 0931 08/03/18 0522 08/04/18 0502 08/05/18 0420 08/06/18 0630  WBC 12.0* 10.6* 13.2* 11.3* 13.0*  NEUTROABS 8.7*  --  10.3* 8.4* 10.9*  HGB 13.3 12.2* 11.8* 12.1* 11.8*  HCT 42.5 38.5* 37.1* 37.1* 36.0*  MCV 95.7 93.4 93.9 92.1 91.4  PLT 259 304 287 279 297     Recent Results (from the past 240 hour(s))  Culture, blood (routine x 2)     Status: None (Preliminary result)   Collection Time: 08/02/18  9:31 AM  Result Value Ref Range Status   Specimen Description BLOOD RIGHT ANTECUBITAL  Final   Special Requests   Final    BOTTLES DRAWN  AEROBIC AND ANAEROBIC Blood Culture adequate volume   Culture   Final    NO GROWTH 4 DAYS Performed at [facility], [address] [place], [place] [address]    Report Status PENDING  Incomplete  Culture, blood (routine x 2)     Status: None (Preliminary result)   Collection Time: 08/02/18  9:36 AM  Result Value Ref Range Status   Specimen Description BLOOD BLOOD RIGHT HAND  Final   Special Requests   Final    BOTTLES DRAWN AEROBIC AND ANAEROBIC Blood Culture adequate volume   Culture   Final    NO GROWTH 4 DAYS Performed at [facility], [address], [place], [place] [address]    Report Status PENDING  Incomplete  Aerobic/Anaerobic Culture (surgical/deep wound)     Status: None (Preliminary result)   Collection Time: 08/03/18  1:05 PM  Result  Value Ref Range Status   Specimen Description   Final    TOE LEFT GREAT TOE Performed at [facility], [address], [place], [place] [address]    Special Requests   Final    NONE Performed at [facility], [address] [place], [place] [address]    Gram Stain   Final    FEW WBC PRESENT, PREDOMINANTLY PMN NO ORGANISMS SEEN    Culture   Final    CULTURE REINCUBATED FOR BETTER GROWTH HOLDING FOR POSSIBLE ANAEROBE Performed at [facility], [place] [address], [place], [place] [address]    Report Status PENDING  Incomplete     Studies: [place] Ekg Site Rite  Result Date: 08/04/2018 If Site Rite image not attached, placement could not be confirmed due to current cardiac rhythm.   Scheduled Meds: . aspirin  81 mg Oral Daily  . Chlorhexidine Gluconate Cloth  6 each Topical Q0600  . digoxin  0.125 mg Oral Daily  . docusate sodium  100 mg Oral BID  . finasteride  5 mg Oral Daily  . furosemide  40 mg Oral Daily  . lisinopril  2.5 mg Oral Daily  . metoprolol succinate  25 mg Oral Daily  . nicotine  14 mg Transdermal Daily  . sodium chloride flush  10-40 mL Intracatheter Q12H   Continuous Infusions: . sodium chloride 250 mL (08/06/18 1351)  . piperacillin-tazobactam (ZOSYN)  IV 3.375 g (08/06/18 1353)    Assessment/Plan:  1. Osteomyelitis of the second metatarsal.  Patient had amputation by Dr. Cleda Mccreedy podiatry.  Patient brought back to the operating room yesterday for I&D and bead insertion.  Infectious disease switched antibiotics over to Zosyn for a 2-week course and then reassessment on whether the patient needs another 2 weeks of IV antibiotics or not.  Social worker to look into rehabs for IV antibiotics or whether the group home can handle this.  PICC line placed.  COVID-19 testing ordered because he may go to a facility (not because he has symptoms).  COVID-19 testing still pending. 2. Chronic atrial fibrillation.  In reviewing cardiology note as  outpatient.  The patient does have a history of atrial fibrillation.  On digoxin and metoprolol for rate control.  Patient willing to try aggressive anticoagulation but I will hold on that now secondary to second surgical procedure yesterday.  Will decide upon discharge. 3. Chronic systolic heart failure with last echocardiogram showing an EF of 25%.  No signs of heart failure currently.  Patient on metoprolol and lisinopril and Lasix. 4. Relative hypotension.  On low-dose lisinopril.  Decreased dose of metoprolol down to 25 mg daily 5. Acute kidney injury on chronic kidney disease stage III.  Continue to monitor with oral diuresis. 6. Tobacco abuse 7. Urinary retention after the prior surgical procedure.  With blood pressure being a little on the lower side I will discontinue Flomax since the patient has been going to the bathroom and I will use Proscar instead.  Code Status:     Code Status Orders  (From admission, onward)         Start     Ordered   08/02/18 1503  Full code  Continuous     08/02/18 1502        Code Status History    This patient has a current code status but no historical code status.      Disposition Plan: Social worker looking into whether he will need rehab versus whether the group home can handle IV antibiotics.  Consultants:  Podiatry  Infectious disease  Antibiotics:  Zosyn  Time spent: 28 minutes.   Brittin Janik [employer]

## 2018-08-06 NOTE — Progress Notes (Signed)
F/u left foot 2nd toe amputation.  Wound is clean and dry.  No purulence or dehiscence.  Dry dressing applied.  Pt going to Rehab facility. Plan: Dressing can be change 3 x's per week with dry bulky dressing. Use orthowedge shoe while ambulating. F/u with Dr. Cleda Mccreedy in 1-2 weeks.

## 2018-08-06 NOTE — Plan of Care (Signed)
  Problem: Health Behavior/Discharge Planning: ?Goal: Ability to manage health-related needs will improve ?Outcome: Progressing ?  ?Problem: Clinical Measurements: ?Goal: Will remain free from infection ?Outcome: Progressing ?  ?Problem: Activity: ?Goal: Risk for activity intolerance will decrease ?Outcome: Progressing ?  ?Problem: Elimination: ?Goal: Will not experience complications related to bowel motility ?Outcome: Progressing ?  ?Problem: Skin Integrity: ?Goal: Risk for impaired skin integrity will decrease ?Outcome: Progressing ?  ?

## 2018-08-06 NOTE — Progress Notes (Signed)
Pharmacy Antibiotic Note  Ruben Clark is a 63 y.o. male admitted on 08/02/2018 with wound infection/ osteomyelitis.  Pharmacy has been consulted for Vancomycin and Cefepime dosing. 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 Patient Initially, he did not receive a completed dose of Cefepime in the ED d/t IV access issues/infiltration, but another dose was administered in the ED around 1500-per Barbette Merino, RN. Therefore, he did not receive Vancomycin 1000 mg in the ED-essentially no antibiotics were given, per Barbette Merino, RN, when the medications were first ordered. 1000 mg Vancomycin was given around 1500 as well.   Verbal given by Kerby Less on his weight (lbs): 186.5 lbs.  Plan: Will continue Cefepime 2g q 8h    Will continue:   Vancomycin 1750 mg IV Q 24 hrs. Goal AUC 400-550. Expected AUC: 486 SCr used: 1.31  Last dose change 4/29 (after load 4/27 and 1500mg  4/28)  4/29 0308 1750mg  4/30 0409 1750mg   4/30 1253 1000mg  (implanted stimulan beads)  5/01 0332 1750mg   05/01 0630 Vanc Peak 38  Vancomycin trough to be drawn 5/2 @ 0230 for 2 level dosing as pt is to continue IV antibiotics after discharge  Height: 6\' 1"  (185.4 cm) Weight: 186 lb 1.1 oz (84.4 kg) IBW/kg (Calculated) : 79.9 Temp (24hrs), Avg:97.7 F (36.5 C), Min:97.5 F (36.4 C), Max:98 F (36.7 C)  Recent Labs  Lab 08/02/18 0931 08/03/18 0522 08/04/18 0502 08/05/18 0420 08/06/18 0630  WBC 12.0* 10.6* 13.2* 11.3* 13.0*  CREATININE 1.68* 1.38* 1.46* 1.37* 1.31*  LATICACIDVEN 0.8  --   --   --   --   VANCOPEAK  --   --   --   --  38    Estimated Creatinine Clearance: 66.1 mL/min (A) (by C-G formula based on SCr of 1.31 mg/dL (H)).   Wt (kg): 84.8 kg Scr (mg/dL): 1.68 (08/02/2018)     Baseline Scr 1.6 mg/dL (02/06/2018)     CrCl 29mL/min  No Known Allergies  Antimicrobials this admission: 04/27 Cefepime >>  04/27  Vancomycin >>   Dose adjustments this admission: As outlined above  Microbiology  results: 4/27 BCx: NG x 4 days 4/28 WCx: Culture re-intubated for better growth 4/30 COVID NEG   Thank you for allowing pharmacy to be a part of this patient's care.  Lu Duffel, PharmD, BCPS Clinical Pharmacist 08/06/2018 10:07 AM

## 2018-08-06 NOTE — Progress Notes (Addendum)
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  Previous first ray resection. S/p 2nd toe resection 2/2 to presence of osteomyelitis.     PT Comments    Pateint demonstrates good carry over with safe STS transfer with RW and post op shoe don/doffing. Patient is able to ambulate modI with RW ans PT cuing for normalized gait pattern (preventing scissoring gait, increased RLE step length, foot clearance, and RW management), which he is able to comply with 75%, with good carry over initially, but difficulty maintaining over trial.     Follow Up Recommendations   HHPT     Equipment Recommendations    Standard walker   Recommendations for Other Services       Precautions / Restrictions      Mobility  Bed Mobility  Independent                Transfers     Patient able to demonstrate good carry over of safety and technique with STS with RW w/o physical assistance needed STS ModI RW                Ambulation/Gait    24ft with RW    Fairly consistent cuing for increased step length and foot clearance (R>L) which patient is able to comply with well, for RW management (keeping it clower to patietn) and to prevent adduction over midline with steps. Patient able to comply with cuing 75% but reverts back to abnormal gait over time           Stairs             Wheelchair Mobility    Modified Rankin (Stroke Patients Only)       Balance                                            Cognition  O x4                                            Exercises   Patient able to demonstrate safety with STS following cuing for hand placement/set up Able to don/doff post op shoe almost ind with some assistance needed with top strap Ambulation  with RW 210 ft with cuing to prevent scissor gait, increased R step length, and RW management with 75% carry over following. NO increased pain with ambulation.     General Comments        Pertinent Vitals/Pain  denies pain    Home Living  Group home                    Prior Function   Ind without AD         PT Goals (current goals can now be found in the care plan section)  Go back to group home    Frequency     min 2X/ weej      PT Plan  Continue progress toward goals    Co-evaluation              AM-PAC PT "6  Clicks" Mobility   Outcome Measure   Home Health PT                End of Session  Gait belt Tolerated treatment well Left in bed, alarm set, with call bell and phone in reach Current Plan remains appropriate R26;81 unsteadiness of feet R26.89 Other abnormalities of gait and mobility              Time:  - 1040-1057 61min    Charges:    Therapeutic Exercise                   Shelton Silvas PT, DPT   Shelton Silvas 08/07/2018, 4:00 PM

## 2018-08-06 NOTE — Progress Notes (Signed)
[place] INFECTIOUS DISEASE PROGRESS NOTE Date of Admission:  08/02/2018     ID: Ruben Clark is a 63 y.o. male with foot wound and osteomyelitis Active Problems:   Left foot infection   Subjective: He had repeat surgery yesterday.  Placement of antibiotic beads.  Removal of some necrotic tissue and purulence.  ROS  Eleven systems are reviewed and negative except per hpi  Medications:  Antibiotics Given (last 72 hours)    Date/Time Action Medication Dose Rate   08/03/18 2152 New Bag/Given   ceFEPIme (MAXIPIME) 2 g in sodium chloride 0.9 % 100 mL IVPB 2 g 200 mL/hr   08/04/18 0308 New Bag/Given   vancomycin (VANCOCIN) 1,750 mg in sodium chloride 0.9 % 500 mL IVPB 1,750 mg 250 mL/hr   08/04/18 0541 New Bag/Given   ceFEPIme (MAXIPIME) 2 g in sodium chloride 0.9 % 100 mL IVPB 2 g 200 mL/hr   08/04/18 1409 New Bag/Given   ceFEPIme (MAXIPIME) 2 g in sodium chloride 0.9 % 100 mL IVPB 2 g 200 mL/hr   08/04/18 2156 New Bag/Given   ceFEPIme (MAXIPIME) 2 g in sodium chloride 0.9 % 100 mL IVPB 2 g 200 mL/hr   08/05/18 0409 New Bag/Given   vancomycin (VANCOCIN) 1,750 mg in sodium chloride 0.9 % 500 mL IVPB 1,750 mg 250 mL/hr   08/05/18 0651 New Bag/Given   ceFEPIme (MAXIPIME) 2 g in sodium chloride 0.9 % 100 mL IVPB 2 g 200 mL/hr   08/05/18 1253 Given  [used in stimulan beads]   vancomycin (VANCOCIN) powder 1,000 mg    08/05/18 1553 New Bag/Given   ceFEPIme (MAXIPIME) 2 g in sodium chloride 0.9 % 100 mL IVPB 2 g 200 mL/hr   08/05/18 2143 New Bag/Given   ceFEPIme (MAXIPIME) 2 g in sodium chloride 0.9 % 100 mL IVPB 2 g 200 mL/hr   08/06/18 0332 New Bag/Given   vancomycin (VANCOCIN) 1,750 mg in sodium chloride 0.9 % 500 mL IVPB 1,750 mg 250 mL/hr   08/06/18 0541 New Bag/Given   ceFEPIme (MAXIPIME) 2 g in sodium chloride 0.9 % 100 mL IVPB 2 g 200 mL/hr     . aspirin  81 mg Oral Daily  . Chlorhexidine Gluconate Cloth  6 each Topical Q0600  . digoxin  0.125 mg Oral Daily  . docusate  sodium  100 mg Oral BID  . finasteride  5 mg Oral Daily  . furosemide  40 mg Oral Daily  . lisinopril  2.5 mg Oral Daily  . metoprolol succinate  25 mg Oral Daily  . nicotine  14 mg Transdermal Daily  . sodium chloride flush  10-40 mL Intracatheter Q12H    Objective: Vital signs in last 24 hours: Temp:  [97.5 F (36.4 C)-97.8 F (36.6 C)] 97.8 F (36.6 C) (05/01 0728) Pulse Rate:  [45-107] 66 (05/01 0728) Resp:  [14-22] 17 (05/01 0728) BP: (91-126)/(66-94) 124/94 (05/01 0728) SpO2:  [93 %-100 %] 99 % (05/01 0728) NAD Wound covered  Lab Results Recent Labs    08/05/18 0420 08/06/18 0630  WBC 11.3* 13.0*  HGB 12.1* 11.8*  HCT 37.1* 36.0*  NA 137  --   K 3.9  --   CL 106  --   CO2 24  --   BUN 23  --   CREATININE 1.37* 1.31*    Microbiology: Results for orders placed or performed during the hospital encounter of 08/02/18  Culture, blood (routine x 2)     Status: None (Preliminary result)  Collection Time: 08/02/18  9:31 AM  Result Value Ref Range Status   Specimen Description BLOOD RIGHT ANTECUBITAL  Final   Special Requests   Final    BOTTLES DRAWN AEROBIC AND ANAEROBIC Blood Culture adequate volume   Culture   Final    NO GROWTH 4 DAYS Performed at [facility], [address], [place], [place] [address]    Report Status PENDING  Incomplete  Culture, blood (routine x 2)     Status: None (Preliminary result)   Collection Time: 08/02/18  9:36 AM  Result Value Ref Range Status   Specimen Description BLOOD BLOOD RIGHT HAND  Final   Special Requests   Final    BOTTLES DRAWN AEROBIC AND ANAEROBIC Blood Culture adequate volume   Culture   Final    NO GROWTH 4 DAYS Performed at [facility], [address] [place], [place] [address]    Report Status PENDING  Incomplete  Aerobic/Anaerobic Culture (surgical/deep wound)     Status: None (Preliminary result)   Collection Time: 08/03/18  1:05 PM  Result Value Ref Range Status   Specimen  Description   Final    TOE LEFT GREAT TOE Performed at [facility], [address], [place], [place] [address]    Special Requests   Final    NONE Performed at [facility], [address] [place], [place] [address]    Gram Stain   Final    FEW WBC PRESENT, PREDOMINANTLY PMN NO ORGANISMS SEEN    Culture   Final    CULTURE REINCUBATED FOR BETTER GROWTH HOLDING FOR POSSIBLE ANAEROBE Performed at [facility], [place] [address], [place], [place] [address]    Report Status PENDING  Incomplete     Studies/Results: [place] Ekg Site Rite  Result Date: 08/04/2018 If Site Rite image not attached, placement could not be confirmed due to current cardiac rhythm.   Assessment/Plan: [address]en [address] is a 63 y.o. male chronic combined systolic and diastolic CHF, hypertension, chronic atrial fibrillation, hyperlipidemia, COPD, tobacco use admitted with worsening wound on left foot.  He was found to have open ulceration and osteomyelitis of the second metatarsal and second toe.  On April 28 he underwent second toe and partial ray amputation.  Cultures are pending.  He has been started on vancomycin and cefepime.  He has no known drug allergies.  Patient lives in a group home.  I do not think he will be very capable of caring for his wound and doing IV antibiotics which he certainly will need. May 1- had repeat surgery April 30.  All cultures are negative but holding for possible follow-up.  Picc placed  Recommendations Will plan on 4 weeks IV abx followed by oral tail if needed. No MRSA identified so will dc vanco Change to zosyn from cefepime especially with possible anaerobe pending.  See IV abx OPAT order sheet for details.  Wound care and further surgical management per podiatry. Thank you very much for the consult. Will follow with you.  Leonel Ramsay   08/06/2018, 12:39 PM

## 2018-08-06 NOTE — Progress Notes (Signed)
PHARMACY CONSULT NOTE FOR:  OUTPATIENT  PARENTERAL ANTIBIOTIC THERAPY (OPAT)  Indication: foot wound and osteomyelitis Regimen: piperacillin/tazobactam 3.375gm IV q6h (37m infusion) End date: 09/02/2018  IV antibiotic discharge orders are pended. To discharging provider:  please sign these orders via discharge navigator,  Select New Orders & click on the button choice - Manage This Unsigned Work.     Thank you for allowing pharmacy to be a part of this patient's care.  Doreene Eland, PharmD, BCPS.   Work Cell: [phone] 08/06/2018 1:16 PM

## 2018-08-06 NOTE — Consult Note (Signed)
Pharmacy Antibiotic Note  Ruben Clark is a 63 y.o. male admitted on 08/02/2018 with wound infection.  Pharmacy has been consulted for Zosyn dosing.  Plan: Will dose Zosyn 3.375g q8 hours (extended infusion)  (Per Dr Blane Ohara note, pt dose at discharge will be regular infusion 3.375g q 6hours until 5/28)   Height: 6\' 1"  (185.4 cm) Weight: 186 lb 1.1 oz (84.4 kg) IBW/kg (Calculated) : 79.9  Temp (24hrs), Avg:97.7 F (36.5 C), Min:97.5 F (36.4 C), Max:97.8 F (36.6 C)  Recent Labs  Lab 08/02/18 0931 08/03/18 0522 08/04/18 0502 08/05/18 0420 08/06/18 0630  WBC 12.0* 10.6* 13.2* 11.3* 13.0*  CREATININE 1.68* 1.38* 1.46* 1.37* 1.31*  LATICACIDVEN 0.8  --   --   --   --   VANCOPEAK  --   --   --   --  38    Estimated Creatinine Clearance: 66.1 mL/min (A) (by C-G formula based on SCr of 1.31 mg/dL (H)).    No Known Allergies  Antimicrobials this admission: 4/27 Vanc >> 5/1 4/27 Cefepime >> 5/1 5/1 Zosyn >>  Microbiology results: 4/27 BCx: NG 4 days 4/28 Wnd Cx: NG (reincubated for possible anaerobe)  Thank you for allowing pharmacy to be a part of this patient's care.  Lu Duffel, PharmD, BCPS Clinical Pharmacist 08/06/2018 1:00 PM

## 2018-08-07 LAB — CULTURE, BLOOD (ROUTINE X 2)
Culture: NO GROWTH
Culture: NO GROWTH
Special Requests: ADEQUATE
Special Requests: ADEQUATE

## 2018-08-07 LAB — NOVEL CORONAVIRUS, NAA (HOSP ORDER, SEND-OUT TO REF LAB; TAT 18-24 HRS): SARS-CoV-2, NAA: NOT DETECTED

## 2018-08-07 NOTE — Progress Notes (Signed)
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  Previous first ray resection. S/p 2nd toe resection 2/2 to presence of osteomyelitis.     PT Comments    Pt did well with prolonged bout of ambulation with relatively consistent cadence and speed and good ability to maintain WBing though heel (orthowedge donned) using walker.  He was eager to do physical activity and did well with seated exercises with good effort.  Pt safe to return home from PT perspective.   Follow Up Recommendations  Home health PT     Equipment Recommendations  Rolling walker with 5" wheels    Recommendations for Other Services       Precautions / Restrictions Precautions Precautions: Fall Restrictions LLE Weight Bearing: Partial weight bearing(through heel with orthowedge)    Mobility  Bed Mobility Overal bed mobility: Independent                Transfers Overall transfer level: Modified independent Equipment used: Rolling walker (2 wheeled)             General transfer comment: Pt needed minimal reminders for hand placement but was able to rise w/o assist  Ambulation/Gait Ambulation/Gait assistance: Supervision Gait Distance (Feet): 200 Feet Assistive device: Rolling walker (2 wheeled)   Gait velocity:     General Gait Details: Pt with consistent but slow cadence.  No LOBs, reliant on walker for WBing and safety but ultimately confident and w/o excessive fatigue.1   Stairs             Wheelchair Mobility    Modified Rankin (Stroke Patients Only)       Balance Overall balance assessment: Modified Independent                                          Cognition Arousal/Alertness: Awake/alert Behavior During Therapy: WFL for tasks assessed/performed Overall Cognitive Status:  Within Functional Limits for tasks assessed                                        Exercises General Exercises - Lower Extremity Ankle Circles/Pumps: AROM;10 reps Long Arc Quad: Strengthening;10 reps;Both Heel Slides: Strengthening;10 reps;Both Hip ABduction/ADduction: Strengthening;10 reps;Both    General Comments        Pertinent Vitals/Pain Pain Assessment: (pt reports very minimal pain)    Home Living                      Prior Function            PT Goals (current goals can now be found in the care plan section) Progress towards PT goals: Progressing toward goals    Frequency    7X/week      PT Plan Current plan remains appropriate    Co-evaluation              AM-PAC PT "6 Clicks" Mobility   Outcome Measure  Help needed turning from your back to your side while in a flat bed without using bedrails?: None Help needed moving from lying on your back to sitting on the side of a flat bed without using  bedrails?: None Help needed moving to and from a bed to a chair (including a wheelchair)?: A Little Help needed standing up from a chair using your arms (e.g., wheelchair or bedside chair)?: A Little Help needed to walk in hospital room?: A Little Help needed climbing 3-5 steps with a railing? : A Little 6 Click Score: 20    End of Session Equipment Utilized During Treatment: Gait belt Activity Tolerance: Patient tolerated treatment well Patient left: in bed;with bed alarm set;with call bell/phone within reach   PT Visit Diagnosis: Unsteadiness on feet (R26.81);Other abnormalities of gait and mobility (R26.89)     Time: 9528-4132 PT Time Calculation (min) (ACUTE ONLY): 27 min  Charges:  $Gait Training: 8-22 mins $Therapeutic Exercise: 8-22 mins                     Kreg Shropshire, DPT 08/07/2018, 4:19 PM

## 2018-08-07 NOTE — Progress Notes (Signed)
[facility] INFECTIOUS [facility] PROGRESS NOTE Date of Admission:  08/02/2018     ID: Ruben Clark is a 63 y.o. male with foot wound and osteomyelitis Active Problems:   Left foot infection   Subjective: Doing well. Planning dc to rehab  ROS  Eleven systems are reviewed and negative except per hpi  Medications:  Antibiotics Given (last 72 hours)    Date/Time Action Medication Dose Rate   08/04/18 1409 New Bag/Given   ceFEPIme (MAXIPIME) 2 g in sodium chloride 0.9 % 100 mL IVPB 2 g 200 mL/hr   08/04/18 2156 New Bag/Given   ceFEPIme (MAXIPIME) 2 g in sodium chloride 0.9 % 100 mL IVPB 2 g 200 mL/hr   08/05/18 0409 New Bag/Given   vancomycin (VANCOCIN) 1,750 mg in sodium chloride 0.9 % 500 mL IVPB 1,750 mg 250 mL/hr   08/05/18 0651 New Bag/Given   ceFEPIme (MAXIPIME) 2 g in sodium chloride 0.9 % 100 mL IVPB 2 g 200 mL/hr   08/05/18 1253 Given  [used in stimulan beads]   vancomycin (VANCOCIN) powder 1,000 mg    08/05/18 1553 New Bag/Given   ceFEPIme (MAXIPIME) 2 g in sodium chloride 0.9 % 100 mL IVPB 2 g 200 mL/hr   08/05/18 2143 New Bag/Given   ceFEPIme (MAXIPIME) 2 g in sodium chloride 0.9 % 100 mL IVPB 2 g 200 mL/hr   08/06/18 0332 New Bag/Given   vancomycin (VANCOCIN) 1,750 mg in sodium chloride 0.9 % 500 mL IVPB 1,750 mg 250 mL/hr   08/06/18 0541 New Bag/Given   ceFEPIme (MAXIPIME) 2 g in sodium chloride 0.9 % 100 mL IVPB 2 g 200 mL/hr   08/06/18 1353 New Bag/Given   piperacillin-tazobactam (ZOSYN) IVPB 3.375 g 3.375 g 12.5 mL/hr   08/06/18 2251 New Bag/Given   piperacillin-tazobactam (ZOSYN) IVPB 3.375 g 3.375 g 12.5 mL/hr   08/07/18 0651 New Bag/Given   piperacillin-tazobactam (ZOSYN) IVPB 3.375 g 3.375 g 12.5 mL/hr     . aspirin  81 mg Oral Daily  . Chlorhexidine Gluconate Cloth  6 each Topical Q0600  . digoxin  0.125 mg Oral Daily  . docusate sodium  100 mg Oral BID  . finasteride  5 mg Oral Daily  . furosemide  40 mg Oral Daily  . lisinopril  2.5 mg Oral Daily   . metoprolol succinate  25 mg Oral Daily  . nicotine  14 mg Transdermal Daily  . sodium chloride flush  10-40 mL Intracatheter Q12H    Objective: Vital signs in last 24 hours: Temp:  [97.5 F (36.4 C)-98.3 F (36.8 C)] 97.5 F (36.4 C) (05/02 0530) Pulse Rate:  [60-87] 60 (05/02 0530) Resp:  [16] 16 (05/02 0530) BP: (117-123)/(80-97) 123/92 (05/02 0530) SpO2:  [99 %-100 %] 99 % (05/02 0530) Constitutional: He is oriented to person, place, and time. dishevled HENT: anicteric Mouth/Throat: Oropharynx is clear and moist. No oropharyngeal exudate.  Cardiovascular: Normal rate, regular rhythm and normal heart sounds.  Pulmonary/Chest: Effort normal and breath sounds normal. No respiratory distress. He has no wheezes.  Abdominal: Soft. Bowel sounds are normal. He exhibits no distension. There is no tenderness.  Lymphadenopathy: He has no cervical adenopathy.  Neurological: He is alert and oriented to person, place, and time.  Skin: L foot medial incision with sutures in place, no drainage. Min necrotic tissue Psychiatric: He has a normal mood and affect. His behavior is normal.        Lab Results Recent Labs    08/05/18 0420 08/06/18 0630  WBC 11.3* 13.0*  HGB 12.1* 11.8*  HCT 37.1* 36.0*  NA 137  --   K 3.9  --   CL 106  --   CO2 24  --   BUN 23  --   CREATININE 1.37* 1.31*    Microbiology: Results for orders placed or performed during the hospital encounter of 08/02/18  Culture, blood (routine x 2)     Status: None   Collection Time: 08/02/18  9:31 AM  Result Value Ref Range Status   Specimen Description BLOOD RIGHT ANTECUBITAL  Final   Special Requests   Final    BOTTLES DRAWN AEROBIC AND ANAEROBIC Blood Culture adequate volume   Culture   Final    NO GROWTH 5 DAYS Performed at [facility], [address] [place], [place] [address]    Report Status 08/07/2018 FINAL  Final  Culture, blood (routine x 2)     Status: None   Collection Time: 08/02/18   9:36 AM  Result Value Ref Range Status   Specimen Description BLOOD BLOOD RIGHT HAND  Final   Special Requests   Final    BOTTLES DRAWN AEROBIC AND ANAEROBIC Blood Culture adequate volume   Culture   Final    NO GROWTH 5 DAYS Performed at [facility], [address], [place], [place] [address]    Report Status 08/07/2018 FINAL  Final  Aerobic/Anaerobic Culture (surgical/deep wound)     Status: None (Preliminary result)   Collection Time: 08/03/18  1:05 PM  Result Value Ref Range Status   Specimen Description   Final    TOE LEFT GREAT TOE Performed at [facility], [address] [place], [place] [address]    Special Requests   Final    NONE Performed at [facility], [address] [place], [place] [address]    Gram Stain   Final    FEW WBC PRESENT, PREDOMINANTLY PMN NO ORGANISMS SEEN    Culture   Final    RARE PASTEURELLA SPECIES RARE MORAXELLA SPECIES BETA LACTAMASE NEGATIVE HOLDING FOR POSSIBLE ANAEROBE Performed at [facility], [place] [address], [place], [place] [address]    Report Status PENDING  Incomplete  Novel Coronavirus, NAA (hospital order; send-out to ref lab)     Status: None   Collection Time: 08/05/18  7:16 AM  Result Value Ref Range Status   SARS-CoV-2, NAA NOT DETECTED NOT DETECTED Final    Comment: (NOTE) This test was developed and its performance characteristics determined by [employer]. This test has not been FDA cleared or approved. This test has been authorized by FDA under an Emergency Use Authorization (EUA). This test is only authorized for the duration of time the declaration that circumstances exist justifying the authorization of the emergency use of in vitro diagnostic tests for detection of SARS-CoV-2 virus and/or diagnosis of COVID-19 infection under section 564(b)(1) of the Act, 21 U.S.C. 007MAU-6(J)(3), unless the authorization is terminated or revoked sooner. When diagnostic testing is  negative, the possibility of a false negative result should be considered in the context of a patient's recent exposures and the presence of clinical signs and symptoms consistent with COVID-19. An individual without symptoms of COVID-19 and who is not shedding SARS-CoV-2 virus would expect to have a negative (not detected) result in this assay. Performed  At: [facility] [address] [place], [place] [id number] Rush Farmer MD SL:[id number]    [place]  Final    Comment: Performed  at [facility], [address] [place], [place] [address]     Studies/Results: No results found.  Assessment/Plan: Chrisotpher Rivero is a 63 y.o. male chronic combined systolic and diastolic CHF, hypertension, chronic atrial fibrillation, hyperlipidemia, COPD, tobacco use admitted with worsening wound on left foot.  He was found to have open ulceration and osteomyelitis of the second metatarsal and second toe.  On April 28 he underwent second toe and partial ray amputation.  Cultures are pending.  He has been started on vancomycin and cefepime.  He has no known drug allergies.  Patient lives in a group home.  I do not think he will be very capable of caring for his wound and doing IV antibiotics which he certainly will need. May 1- had repeat surgery April 30.  All cultures are negative but holding for possible follow-up.  Picc placed  May 2 - doing well - pending dc Recommendations Will plan on 4 weeks IV abx followed by oral tail if needed.  Changed to  zosyn from cefepime especially with possible anaerobe pending. See IV abx OPAT order sheet for details. No MRSA identified so will dc vanco Wound care and further surgical management per podiatry. I will sign off now but please call with questions Thank you very much for the consult.   Leonel Ramsay   08/07/2018, 11:39 AM

## 2018-08-07 NOTE — Progress Notes (Signed)
Patient ID: Ruben Clark, male   DOB: [birth year], 63 y.o.   MRN: [id number]   [facility] [employer] PROGRESS NOTE  Ruben Clark ZJQ:[id number] DOB: 14-Jul-1955 DOA: 08/02/2018 PCP: System, Pcp Not In  HPI/Subjective: Patient feels okay.  Offers no complaints.  He is hoping not to be in rehab too long.  Objective: Vitals:   08/07/18 0528 08/07/18 0530  BP: (!) 123/92 (!) 123/92  Pulse: 87 60  Resp:  16  Temp: (!) 97.5 F (36.4 C) (!) 97.5 F (36.4 C)  SpO2: 100% 99%    Filed Weights   08/02/18 1532 08/03/18 1207 08/05/18 1153  Weight: 84.4 kg 84.4 kg 84.4 kg    ROS: Review of Systems  Constitutional: Negative for chills and fever.  Eyes: Negative for blurred vision.  Respiratory: Negative for cough and shortness of breath.   Cardiovascular: Negative for chest pain.  Gastrointestinal: Negative for abdominal pain, constipation, diarrhea, nausea and vomiting.  Genitourinary: Negative for dysuria.  Musculoskeletal: Negative for joint pain.  Neurological: Negative for dizziness and headaches.   Exam: Physical Exam  Constitutional: He is oriented to person, place, and time.  HENT:  Nose: No mucosal edema.  Mouth/Throat: No oropharyngeal exudate or posterior oropharyngeal edema.  Eyes: Pupils are equal, round, and reactive to light. Conjunctivae, EOM and lids are normal.  Neck: No JVD present. Carotid bruit is not present. No edema present. No thyroid mass and no thyromegaly present.  Cardiovascular: S1 normal and S2 normal. An irregularly irregular rhythm present. Exam reveals no gallop.  No murmur heard. Pulses:      Dorsalis pedis pulses are 2+ on the right side and 2+ on the left side.  Respiratory: No respiratory distress. He has no wheezes. He has no rhonchi. He has no rales.  GI: Soft. Bowel sounds are normal. There is no abdominal tenderness.  Musculoskeletal:     Right shoulder: He exhibits no swelling.  Lymphadenopathy:    He has no cervical adenopathy.  Neurological: He  is alert and oriented to person, place, and time. No cranial nerve deficit.  Skin: Skin is warm. Nails show no clubbing.  Left foot wrapped.  Psychiatric: He has a normal mood and affect.      Data Reviewed: Basic Metabolic Panel: Recent Labs  Lab 08/02/18 0931 08/03/18 0522 08/04/18 0502 08/05/18 0420 08/06/18 0630  NA 135 139  --  137  --   K 4.6 4.5  --  3.9  --   CL 106 110  --  106  --   CO2 22 23  --  24  --   GLUCOSE 108* 103*  --  107*  --   BUN 23 22  --  23  --   CREATININE 1.68* 1.38* 1.46* 1.37* 1.31*  CALCIUM 10.4* 10.0  --  10.1  --    CBC: Recent Labs  Lab 08/02/18 0931 08/03/18 0522 08/04/18 0502 08/05/18 0420 08/06/18 0630  WBC 12.0* 10.6* 13.2* 11.3* 13.0*  NEUTROABS 8.7*  --  10.3* 8.4* 10.9*  HGB 13.3 12.2* 11.8* 12.1* 11.8*  HCT 42.5 38.5* 37.1* 37.1* 36.0*  MCV 95.7 93.4 93.9 92.1 91.4  PLT 259 304 287 279 297     Recent Results (from the past 240 hour(s))  Culture, blood (routine x 2)     Status: None   Collection Time: 08/02/18  9:31 AM  Result Value Ref Range Status   Specimen Description BLOOD RIGHT ANTECUBITAL  Final   Special Requests   Final  BOTTLES DRAWN AEROBIC AND ANAEROBIC Blood Culture adequate volume   Culture   Final    NO GROWTH 5 DAYS Performed at [facility], [address] [place], [place] [address]    Report Status 08/07/2018 FINAL  Final  Culture, blood (routine x 2)     Status: None   Collection Time: 08/02/18  9:36 AM  Result Value Ref Range Status   Specimen Description BLOOD BLOOD RIGHT HAND  Final   Special Requests   Final    BOTTLES DRAWN AEROBIC AND ANAEROBIC Blood Culture adequate volume   Culture   Final    NO GROWTH 5 DAYS Performed at [facility], [address] [place], [place] [address]    Report Status 08/07/2018 FINAL  Final  Aerobic/Anaerobic Culture (surgical/deep wound)     Status: None (Preliminary result)   Collection Time: 08/03/18  1:05 PM  Result Value Ref  Range Status   Specimen Description   Final    TOE LEFT GREAT TOE Performed at [facility], [address] [place], [place] [address]    Special Requests   Final    NONE Performed at [facility], [address] [place], [place] [address]    Gram Stain   Final    FEW WBC PRESENT, PREDOMINANTLY PMN NO ORGANISMS SEEN    Culture   Final    RARE PASTEURELLA SPECIES RARE MORAXELLA SPECIES BETA LACTAMASE NEGATIVE HOLDING FOR POSSIBLE ANAEROBE Performed at [facility], [place] [address] [place], [place] [address]    Report Status PENDING  Incomplete  Novel Coronavirus, NAA (hospital order; send-out to ref lab)     Status: None   Collection Time: 08/05/18  7:16 AM  Result Value Ref Range Status   SARS-CoV-2, NAA NOT DETECTED NOT DETECTED Final    Comment: (NOTE) This test was developed and its performance characteristics determined by [employer]. This test has not been FDA cleared or approved. This test has been authorized by FDA under an Emergency Use Authorization (EUA). This test is only authorized for the duration of time the declaration that circumstances exist justifying the authorization of the emergency use of in vitro diagnostic tests for detection of SARS-CoV-2 virus and/or diagnosis of COVID-19 infection under section 564(b)(1) of the Act, 21 U.S.C. 151VOH-6(W)(7), unless the authorization is terminated or revoked sooner. When diagnostic testing is negative, the possibility of a false negative result should be considered in the context of a patient's recent exposures and the presence of clinical signs and symptoms consistent with COVID-19. An individual without symptoms of COVID-19 and who is not shedding SARS-CoV-2 virus would expect to have a negative (not detected) result in this assay. Performed  At: [facility] [place], [place] [id number] Rush Farmer MD WN:[id number]    [place]  Final    Comment: Performed at [facility], [address] [place], [place] [address]     Studies: No results found.  Scheduled Meds: . aspirin  81 mg Oral Daily  . Chlorhexidine Gluconate Cloth  6 each Topical Q0600  . digoxin  0.125 mg Oral Daily  . docusate sodium  100 mg Oral BID  . finasteride  5 mg Oral Daily  . furosemide  40 mg Oral Daily  . lisinopril  2.5 mg Oral Daily  . metoprolol succinate  25 mg Oral Daily  . nicotine  14 mg Transdermal Daily  . sodium chloride flush  10-40 mL Intracatheter  Q12H   Continuous Infusions: . sodium chloride Stopped (08/06/18 2251)  . piperacillin-tazobactam (ZOSYN)  IV Stopped (08/07/18 1001)    Assessment/Plan:  1. Osteomyelitis of the second metatarsal.  Patient had amputation by Dr. Cleda Mccreedy podiatry.  Patient again had to go back to operating room for I&D and bead insertion.  Infectious disease continue Zosyn 2-week course and then reassessment on whether the patient needs another 2 weeks of IV antibiotics or not.   2. Chronic atrial fibrillation.  In reviewing cardiology note as outpatient.  The patient does have a history of atrial fibrillation.  On digoxin and metoprolol for rate control.  Patient willing to try aggressive anticoagulation but I will hold on that now secondary to second surgical procedure yesterday.  Will decide upon discharge. 3. Chronic systolic heart failure with last echocardiogram showing an EF of 25%.  No signs of heart failure currently.  Patient on metoprolol and lisinopril and Lasix. 4. Relative hypotension.  On low-dose lisinopril.  Decreased dose of metoprolol down to 25 mg daily 5. Acute kidney injury on chronic kidney disease stage III.  Continue to monitor with oral diuresis. 6. Tobacco abuse 7. Urinary retention after the prior surgical procedure.  With blood pressure being a little on the lower side I will discontinue Flomax since the patient has been going to the bathroom and I  will use Proscar instead.  Code Status:     Code Status Orders  (From admission, onward)         Start     Ordered   08/02/18 1503  Full code  Continuous     08/02/18 1502        Code Status History    This patient has a current code status but no historical code status.      Disposition Plan: Social worker looking into whether he will need rehab versus whether the group home can handle IV antibiotics.  Consultants:  Podiatry  Infectious disease  Antibiotics:  Zosyn  Time spent: 28 minutes.   Jameek Bruntz [facility]

## 2018-08-07 NOTE — NC FL2 (Addendum)
[place] LEVEL OF CARE SCREENING TOOL     IDENTIFICATION  Patient Name: Ruben Clark Birthdate: Apr 26, 1955 Sex: male Admission Date (Current Location): 08/02/2018  [place] and [place] Number:  Engineering geologist and Address:  [facility], [address] [place], [place] [address]      Provider Number: [id number]  Attending Physician Name and Address:  Dustin Flock, MD  Relative Name and Phone Number:  Legal guardian Katy Apo [phone]    Current Level of Care: Hospital Recommended Level of Care: [place] Prior Approval Number:    Date Approved/Denied:   PASRR Number: [id number]  Discharge Plan: SNF    Current Diagnoses: Patient Active Problem List   Diagnosis Date Noted  . Left foot infection 08/02/2018    Orientation RESPIRATION BLADDER Height & Weight     Self, Situation, Time, Place  Normal Incontinent Weight: 84.4 kg Height:  6\' 1"  (185.4 cm)  BEHAVIORAL SYMPTOMS/MOOD NEUROLOGICAL BOWEL NUTRITION STATUS      Continent Diet  AMBULATORY STATUS COMMUNICATION OF NEEDS Skin   Limited Assist Verbally Surgical wounds(diabetic ulceration with I&D performed)                       Personal Care Assistance Level of Assistance  Bathing, Feeding, Dressing, Total care Bathing Assistance: Limited assistance Feeding assistance: Independent Dressing Assistance: Limited assistance Total Care Assistance: Limited assistance   Functional Limitations Info  Sight, Hearing, Speech Sight Info: Adequate Hearing Info: Adequate Speech Info: Adequate    SPECIAL CARE FACTORS FREQUENCY  PT (By licensed PT), OT (By licensed OT)     PT Frequency: 5x per week OT Frequency: 5x per week            Contractures Contractures Info: Not present    Additional Factors Info  Code Status               Current Medications (08/07/2018):  This is the current hospital active medication list Current  Facility-Administered Medications  Medication Dose Route Frequency Provider Last Rate Last Dose  . 0.9 %  sodium chloride infusion   Intravenous PRN Loletha Grayer, MD   Stopped at 08/06/18 1353  . acetaminophen (TYLENOL) tablet 650 mg  650 mg Oral Q6H PRN Sharlotte Alamo, DPM       Or  . acetaminophen (TYLENOL) suppository 650 mg  650 mg Rectal Q6H PRN Sharlotte Alamo, DPM      . aspirin chewable tablet 81 mg  81 mg Oral Daily Sharlotte Alamo, DPM   81 mg at 08/07/18 0916  . Chlorhexidine Gluconate Cloth 2 % PADS 6 each  6 each Topical Q0600 Sharlotte Alamo, DPM   6 each at 08/05/18 [phone]  . digoxin (LANOXIN) tablet 0.125 mg  0.125 mg Oral Daily Sharlotte Alamo, DPM   0.125 mg at 08/04/18 0935  . docusate sodium (COLACE) capsule 100 mg  100 mg Oral BID Sharlotte Alamo, DPM   100 mg at 08/07/18 0915  . finasteride (PROSCAR) tablet 5 mg  5 mg Oral Daily Loletha Grayer, MD   5 mg at 08/07/18 0916  . furosemide (LASIX) tablet 40 mg  40 mg Oral Daily Sharlotte Alamo, DPM   40 mg at 08/07/18 0915  . HYDROcodone-acetaminophen (NORCO/VICODIN) 5-325 MG per tablet 1 tablet  1 tablet Oral Q4H PRN Sharlotte Alamo, DPM   1 tablet at 08/06/18 2256  . lisinopril (ZESTRIL) tablet 2.5 mg  2.5 mg Oral Daily Sharlotte Alamo, DPM  2.5 mg at 08/07/18 0915  . metoprolol succinate (TOPROL-XL) 24 hr tablet 25 mg  25 mg Oral Daily Loletha Grayer, MD   25 mg at 08/07/18 0915  . nicotine (NICODERM CQ - dosed in mg/24 hours) patch 14 mg  14 mg Transdermal Daily Sharlotte Alamo, DPM      . ondansetron [facility]) tablet 4 mg  4 mg Oral Q6H PRN Sharlotte Alamo, DPM       Or  . ondansetron [facility]) injection 4 mg  4 mg Intravenous Q6H PRN Sharlotte Alamo, DPM      . phenol (CHLORASEPTIC) mouth spray 1 spray  1 spray Mouth/Throat PRN Sharlotte Alamo, DPM   1 spray at 08/03/18 2147  . piperacillin-tazobactam (ZOSYN) IVPB 3.375 g  3.375 g Intravenous Q8H Lu Duffel, RPH 12.5 mL/hr at 08/07/18 0651 3.375 g at 08/07/18 0651  . polyethylene glycol (MIRALAX / GLYCOLAX)  packet 17 g  17 g Oral Daily PRN Sharlotte Alamo, DPM      . sodium chloride flush (NS) 0.9 % injection 10-40 mL  10-40 mL Intracatheter Q12H Sharlotte Alamo, DPM   10 mL at 08/05/18 2145  . sodium chloride flush (NS) 0.9 % injection 10-40 mL  10-40 mL Intracatheter PRN Sharlotte Alamo, DPM      . sodium chloride flush (NS) 0.9 % injection 10-40 mL  10-40 mL Intracatheter PRN Sharlotte Alamo, DPM         Discharge Medications: Please see discharge summary for a list of discharge medications.  Relevant Imaging Results:  Relevant Lab Results:   Additional Information SS# [id number]. Has legal guardian Katy Apo [phone]. Will need IV ABX. IV zosyn until 5/28. PICC line in place.  Latanya Maudlin, RN

## 2018-08-08 LAB — AEROBIC/ANAEROBIC CULTURE W GRAM STAIN (SURGICAL/DEEP WOUND)

## 2018-08-08 MED ORDER — APIXABAN 5 MG PO TABS
5.0000 mg | ORAL_TABLET | Freq: Two times a day (BID) | ORAL | Status: DC
  Administered 2018-08-08 – 2018-08-09 (×2): 5 mg via ORAL
  Filled 2018-08-08 (×2): qty 1

## 2018-08-08 NOTE — TOC Transition Note (Signed)
Transition of Care [facility]) - CM/SW Discharge Note   Patient Details  Name: Linville Decarolis MRN: [id number] Date of Birth: 1955/09/24  Transition of Care [facility]) CM/SW Contact:  Latanya Maudlin, RN Phone Number: 08/08/2018, 4:08 PM   Clinical Narrative:  Voicemail left with [name] who is legal guardian. Attempting to contact [place] for assessment purposes. Patient is being worked up for SNF. Bed offers have been worked up for IV abx admin at [facility]. Unable to obtain PASSAR. Confirmed SS# with patient, [facility] web site possible has information incorrect. Will reach out to them tomorrow to obtain PASSAR>     Final next level of care: [place] Barriers to Discharge: Continued Medical Work up   Patient Goals and CMS Choice Patient states their goals for this hospitalization and ongoing recovery are:: get out of here and get foot fixed CMS Medicare.gov Compare Post Acute Care list provided to:: Patient    Discharge Placement                       Discharge Plan and Services   Discharge Planning Services: CM Consult            DME Arranged: (none needed)         HH Arranged: RN, IV Antibiotics, PT HH Agency: [place] ([facility]) Date [place]: 08/06/18 Time [place]: 1025 Representative spoke with at [place]: Corene Cornea from [place] with infusion  Social Determinants of Health ([place]) Interventions     Readmission Risk Interventions No flowsheet data found.

## 2018-08-08 NOTE — Progress Notes (Signed)
PT Cancellation Note  Patient Details Name: [name] MRN: [id number] DOB: 05-12-1955   Cancelled Treatment:    Reason Eval/Treat Not Completed: Other (comment) Chart reviewed. Nursing consulted. Per nursing: patient was combative overnight and has been mildly agitated. PT will follow-up at a later time/date.  Myles Gip PT, DPT [phone] 08/08/2018, 9:17 AM

## 2018-08-08 NOTE — Progress Notes (Signed)
Patient ID: Ruben Clark, male   DOB: 08/04/1955, 63 y.o.   MRN: [id number]   [employer] PROGRESS NOTE  Ruben Clark VHQ:[id number] DOB: March 09, 1956 DOA: 08/02/2018 PCP: System, Pcp Not In  HPI/Subjective: Patient pain under control .   Objective: Vitals:   08/08/18 0424 08/08/18 0745  BP: 116/83 116/77  Pulse: 76 (!) 42  Resp: 17 17  Temp: 98.4 F (36.9 C) 98.2 F (36.8 C)  SpO2: 98% 100%    Filed Weights   08/02/18 1532 08/03/18 1207 08/05/18 1153  Weight: 84.4 kg 84.4 kg 84.4 kg    ROS: Review of Systems  Constitutional: Negative for chills and fever.  Eyes: Negative for blurred vision.  Respiratory: Negative for cough and shortness of breath.   Cardiovascular: Negative for chest pain.  Gastrointestinal: Negative for abdominal pain, constipation, diarrhea, nausea and vomiting.  Genitourinary: Negative for dysuria.  Musculoskeletal: Negative for joint pain.  Neurological: Negative for dizziness and headaches.   Exam: Physical Exam  Constitutional: He is oriented to person, place, and time.  HENT:  Nose: No mucosal edema.  Mouth/Throat: No oropharyngeal exudate or posterior oropharyngeal edema.  Eyes: Pupils are equal, round, and reactive to light. Conjunctivae, EOM and lids are normal.  Neck: No JVD present. Carotid bruit is not present. No edema present. No thyroid mass and no thyromegaly present.  Cardiovascular: S1 normal and S2 normal. An irregularly irregular rhythm present. Exam reveals no gallop.  No murmur heard. Pulses:      Dorsalis pedis pulses are 2+ on the right side and 2+ on the left side.  Respiratory: No respiratory distress. He has no wheezes. He has no rhonchi. He has no rales.  GI: Soft. Bowel sounds are normal. There is no abdominal tenderness.  Musculoskeletal:     Right shoulder: He exhibits no swelling.  Lymphadenopathy:    He has no cervical adenopathy.  Neurological: He is alert and oriented to person, place, and time. No cranial nerve  deficit.  Skin: Skin is warm. Nails show no clubbing.  Left foot wrapped.  Psychiatric: He has a normal mood and affect.      Data Reviewed: Basic Metabolic Panel: Recent Labs  Lab 08/02/18 0931 08/03/18 0522 08/04/18 0502 08/05/18 0420 08/06/18 0630  NA 135 139  --  137  --   K 4.6 4.5  --  3.9  --   CL 106 110  --  106  --   CO2 22 23  --  24  --   GLUCOSE 108* 103*  --  107*  --   BUN 23 22  --  23  --   CREATININE 1.68* 1.38* 1.46* 1.37* 1.31*  CALCIUM 10.4* 10.0  --  10.1  --    CBC: Recent Labs  Lab 08/02/18 0931 08/03/18 0522 08/04/18 0502 08/05/18 0420 08/06/18 0630  WBC 12.0* 10.6* 13.2* 11.3* 13.0*  NEUTROABS 8.7*  --  10.3* 8.4* 10.9*  HGB 13.3 12.2* 11.8* 12.1* 11.8*  HCT 42.5 38.5* 37.1* 37.1* 36.0*  MCV 95.7 93.4 93.9 92.1 91.4  PLT 259 304 287 279 297     Recent Results (from the past 240 hour(s))  Culture, blood (routine x 2)     Status: None   Collection Time: 08/02/18  9:31 AM  Result Value Ref Range Status   Specimen Description BLOOD RIGHT ANTECUBITAL  Final   Special Requests   Final    BOTTLES DRAWN AEROBIC AND ANAEROBIC Blood Culture adequate volume   Culture  Final    NO GROWTH 5 DAYS Performed at [facility], [address] [place], [place] [address]    Report Status 08/07/2018 FINAL  Final  Culture, blood (routine x 2)     Status: None   Collection Time: 08/02/18  9:36 AM  Result Value Ref Range Status   Specimen Description BLOOD BLOOD RIGHT HAND  Final   Special Requests   Final    BOTTLES DRAWN AEROBIC AND ANAEROBIC Blood Culture adequate volume   Culture   Final    NO GROWTH 5 DAYS Performed at [facility], [address] [place], [place] [address]    Report Status 08/07/2018 FINAL  Final  Aerobic/Anaerobic Culture (surgical/deep wound)     Status: None (Preliminary result)   Collection Time: 08/03/18  1:05 PM  Result Value Ref Range Status   Specimen Description   Final    TOE LEFT GREAT  TOE Performed at [facility], [address], [place], [place] [address]    Special Requests   Final    NONE Performed at [facility], [address], [place], [place] [address]    Gram Stain   Final    FEW WBC PRESENT, PREDOMINANTLY PMN NO ORGANISMS SEEN Performed at [facility], [place] [address] [place], [place] [address]    Culture   Final    RARE PASTEURELLA SPECIES RARE MORAXELLA SPECIES BETA LACTAMASE NEGATIVE RARE ALCALIGENES FAECALIS FEW FUSOBACTERIUM NUCLEATUM    Report Status PENDING  Incomplete  Novel Coronavirus, NAA (hospital order; send-out to ref lab)     Status: None   Collection Time: 08/05/18  7:16 AM  Result Value Ref Range Status   SARS-CoV-2, NAA NOT DETECTED NOT DETECTED Final    Comment: (NOTE) This test was developed and its performance characteristics determined by [employer]. This test has not been FDA cleared or approved. This test has been authorized by FDA under an Emergency Use Authorization (EUA). This test is only authorized for the duration of time the declaration that circumstances exist justifying the authorization of the emergency use of in vitro diagnostic tests for detection of SARS-CoV-2 virus and/or diagnosis of COVID-19 infection under section 564(b)(1) of the Act, 21 U.S.C. 403KVQ-2(V)(9), unless the authorization is terminated or revoked sooner. When diagnostic testing is negative, the possibility of a false negative result should be considered in the context of a patient's recent exposures and the presence of clinical signs and symptoms consistent with COVID-19. An individual without symptoms of COVID-19 and who is not shedding SARS-CoV-2 virus would expect to have a negative (not detected) result in this assay. Performed  At: [facility] [place], [place] [id number] Rush Farmer MD PI:[id number]    [place]  Final    Comment: Performed at  [facility], [address] [place], [place] [address]     Studies: No results found.  Scheduled Meds: . aspirin  81 mg Oral Daily  . Chlorhexidine Gluconate Cloth  6 each Topical Q0600  . digoxin  0.125 mg Oral Daily  . docusate sodium  100 mg Oral BID  . finasteride  5 mg Oral Daily  . furosemide  40 mg Oral Daily  . lisinopril  2.5 mg Oral Daily  . metoprolol succinate  25 mg Oral Daily  . nicotine  14 mg Transdermal Daily  . sodium chloride flush  10-40 mL Intracatheter Q12H   Continuous Infusions: . sodium chloride Stopped (08/06/18 2251)  .  piperacillin-tazobactam (ZOSYN)  IV 3.375 g (08/08/18 5183)    Assessment/Plan:  1. Osteomyelitis of the second metatarsal.  Patient had amputation by Dr. Cleda Mccreedy podiatry.  Patient again had to go back to operating room for I&D and bead insertion.  Per infectious disease continue Zosyn 2-week course and then reassessment on whether the patient needs another 2 weeks of IV antibiotics or not.   2. Chronic atrial fibrillation.  Continue digoxin and metoprolol for rate control.  Start Eliquis 3. Chronic systolic heart failure with last echocardiogram showing an EF of 25%.  No signs of heart failure currently.  Patient on metoprolol and lisinopril and Lasix. 4. Relative hypotension.  On low-dose lisinopril.  Decreased dose of metoprolol down to 25 mg daily 5. Acute kidney injury on chronic kidney disease stage III.  Continue to monitor with oral diuresis. 6. Tobacco abuse  Code Status:     Code Status Orders  (From admission, onward)         Start     Ordered   08/02/18 1503  Full code  Continuous     08/02/18 1502        Code Status History    This patient has a current code status but no historical code status.      Disposition Plan: Social worker looking into whether he will need rehab versus whether the group home can handle IV antibiotics.  Consultants:  Podiatry  Infectious  disease  Antibiotics:  Zosyn  Time spent: 28 minutes.   Sana Tessmer [facility]

## 2018-08-08 NOTE — Plan of Care (Signed)
Patient calm and cooperative with care. Patient requested PRN pain medication given to good effect. Patient sitting up in bed, reports difficulty sleeping. Will continue to monitor.

## 2018-08-08 NOTE — Progress Notes (Signed)
[place] for apixaban Indication: atrial fibrillation  No Known Allergies  Patient Measurements: Height: 6\' 1"  (185.4 cm) Weight: 186 lb 1.1 oz (84.4 kg) IBW/kg (Calculated) : 79.9  Vital Signs: Temp: 98.2 F (36.8 C) (05/03 0745) Temp Source: Oral (05/03 0745) BP: 116/77 (05/03 0745) Pulse Rate: 42 (05/03 0745)  Labs: Recent Labs    08/06/18 0630  HGB 11.8*  HCT 36.0*  PLT 297  CREATININE 1.31*    Estimated Creatinine Clearance: 66.1 mL/min (A) (by C-G formula based on SCr of 1.31 mg/dL (H)).   Medical History: Past Medical History:  Diagnosis Date  . CHF (congestive heart failure) ([place])   . Hypertension     Assessment: 63 year old male with nonvalvular AF. Pharmacy consulted to start apixaban. Patient does not meet any of the criteria for a reduced dose.  Goal of Therapy:  Monitor platelets by anticoagulation protocol: Yes   Plan:  Apixaban 5 mg BID to start today. Pharmacy to follow CBC while patient remains in hospital.  Tawnya Crook, PharmD Pharmacy Resident  08/08/2018 9:40 AM

## 2018-08-09 LAB — BASIC METABOLIC PANEL
Anion gap: 7 (ref 5–15)
BUN: 19 mg/dL (ref 8–23)
CO2: 31 mmol/L (ref 22–32)
Calcium: 10.1 mg/dL (ref 8.9–10.3)
Chloride: 100 mmol/L (ref 98–111)
Creatinine, Ser: 1.22 mg/dL (ref 0.61–1.24)
GFR calc Af Amer: >60 mL/min (ref >60)
GFR calc non Af Amer: >60 mL/min (ref >60)
Glucose, Bld: 112 mg/dL — ABNORMAL HIGH (ref 70–99)
Potassium: 3.5 mmol/L (ref 3.5–5.1)
Sodium: 138 mmol/L (ref 135–145)

## 2018-08-09 LAB — CBC
HCT: 37.6 % — ABNORMAL LOW (ref 39.0–52.0)
Hemoglobin: 12.1 g/dL — ABNORMAL LOW (ref 13.0–17.0)
MCH: 30.1 pg (ref 26.0–34.0)
MCHC: 32.2 g/dL (ref 30.0–36.0)
MCV: 93.5 fL (ref 80.0–100.0)
Platelets: 271 10*3/uL (ref 150–400)
RBC: 4.02 MIL/uL — ABNORMAL LOW (ref 4.22–5.81)
RDW: 13 % (ref 11.5–15.5)
WBC: 13.4 10*3/uL — ABNORMAL HIGH (ref 4.0–10.5)
nRBC: 0 % (ref 0.0–0.2)

## 2018-08-09 MED ORDER — LORAZEPAM 0.5 MG PO TABS: 0.2500 mg | ORAL_TABLET | Freq: Every day | ORAL | 0 refills | Status: DC

## 2018-08-09 MED ORDER — NICOTINE 14 MG/24HR TD PT24: 14.0000 mg | MEDICATED_PATCH | Freq: Every day | TRANSDERMAL | 0 refills | Status: DC

## 2018-08-09 MED ORDER — FUROSEMIDE 40 MG PO TABS: 20.0000 mg | ORAL_TABLET | Freq: Every day | ORAL | Status: DC

## 2018-08-09 MED ORDER — APIXABAN 5 MG PO TABS: 5.0000 mg | ORAL_TABLET | Freq: Two times a day (BID) | ORAL | Status: DC

## 2018-08-09 MED ORDER — HYDROCODONE-ACETAMINOPHEN 7.5-325 MG PO TABS: 1.0000 | ORAL_TABLET | Freq: Four times a day (QID) | ORAL | 0 refills | Status: DC | PRN

## 2018-08-09 MED ORDER — PIPERACILLIN-TAZOBACTAM IV (FOR PTA / DISCHARGE USE ONLY): 3.3750 g | Freq: Four times a day (QID) | INTRAVENOUS | 0 refills | Status: AC

## 2018-08-09 MED ORDER — FINASTERIDE 5 MG PO TABS: 5.0000 mg | ORAL_TABLET | Freq: Every day | ORAL | Status: DC

## 2018-08-09 MED ORDER — METOPROLOL SUCCINATE ER 25 MG PO TB24: 25.0000 mg | ORAL_TABLET | Freq: Every day | ORAL | Status: DC

## 2018-08-09 NOTE — TOC Progression Note (Signed)
Transition of Care [facility]) - Progression Note    Patient Details  Name: Ruben Clark MRN: [id number] Date of Birth: [birth year]  [facility]) CM/SW Contact  Su Hilt, RN Phone Number: 08/09/2018, 11:24 AM  Clinical Narrative:    [name] the legal guardian returned my call and gave permission for the patient to go to [facility] today via EMS [employer] called and gave auth approval for 3 days ref number [id number] Fax number to send additional clinical for extended days is [phone]   Expected Discharge Plan: Group Home Barriers to Discharge: Continued Medical Work up  Expected Discharge Plan and Services Expected Discharge Plan: Group Home   Discharge Planning Services: CM Consult   Living arrangements for the past 2 months: Group Home Expected Discharge Date: 08/09/18               DME Arranged: (none needed)         HH Arranged: RN, IV Antibiotics, PT HH Agency: [place] ([facility]) Date [facility] Agency Contacted: 08/06/18 Time HH Agency Contacted: 1113 Representative spoke with at [place] from [place] with infusion   Social Determinants of Health ([place]) Interventions    Readmission Risk Interventions No flowsheet data found.

## 2018-08-09 NOTE — TOC Transition Note (Signed)
Transition of Care [facility]) - CM/SW Discharge Note   Patient Details  Name: Ruben Clark MRN: [id number] Date of Birth: 1955/09/15  Transition of Care [facility]) CM/SW Contact:  Su Hilt, RN Phone Number: 08/09/2018, 11:30 AM   Clinical Narrative:     Patient to DC today via EMS to [facility] room [address] [facility] [place] has been notified and has given permission Bed side nurse to call report to [phone] DC packet on chart    Final next level of care: Skilled Nursing Facility Barriers to Discharge: Barriers Resolved   Patient Goals and CMS Choice Patient states their goals for this hospitalization and ongoing recovery are:: get out of here and get foot fixed CMS Medicare.gov Compare Post Acute Care list provided to:: Patient    Discharge Placement                       Discharge Plan and Services   Discharge Planning Services: CM Consult            DME Arranged: (none needed)         HH Arranged: RN, IV Antibiotics, PT HH Agency: [place] ([facility]) Date [place]: 08/06/18 Time [place]: 8295 Representative spoke with at [place]: Corene Cornea from [place] with infusion  Social Determinants of Health ([place]) Interventions     Readmission Risk Interventions No flowsheet data found.

## 2018-08-09 NOTE — Care Management Important Message (Signed)
Important Message  Patient Details  Name: Ruben Clark MRN: [id number] Date of Birth: 24-Feb-1956   Medicare Important Message Given:  Yes    Juliann Pulse A Zettie [place] 08/09/2018, 10:35 AM

## 2018-08-09 NOTE — Progress Notes (Signed)
Report called and given to [facility] at [facility] [place]. EMS called for report. Pt dressed awaiting transport.

## 2018-08-09 NOTE — Plan of Care (Signed)
Patient is pleasant and cooperative with care. Patient still going to sleep late and waking up early. Patient report pain in foot, PRN given to good effect.    Problem: Education: Goal: Knowledge of General Education information will improve Description Including pain rating scale, medication(s)/side effects and non-pharmacologic comfort measures Outcome: Progressing   Problem: Health Behavior/Discharge Planning: Goal: Ability to manage health-related needs will improve Outcome: Progressing   Problem: Clinical Measurements: Goal: Ability to maintain clinical measurements within normal limits will improve Outcome: Progressing Goal: Will remain free from infection Outcome: Progressing Goal: Diagnostic test results will improve Outcome: Progressing   Problem: Activity: Goal: Risk for activity intolerance will decrease Outcome: Progressing   Problem: Elimination: Goal: Will not experience complications related to bowel motility Outcome: Progressing   Problem: Pain Managment: Goal: General experience of comfort will improve Outcome: Progressing   Problem: Safety: Goal: Ability to remain free from injury will improve Outcome: Progressing   Problem: Skin Integrity: Goal: Risk for impaired skin integrity will decrease Outcome: Progressing

## 2018-08-09 NOTE — Discharge Summary (Signed)
[place] at [facility], 63 y.o., DOB May 05, 1955, MRN [id number]. Admission date: 08/02/2018 Discharge Date 08/09/2018 Primary MD System, Pcp Not In Admitting Physician Sela Hua, MD  Admission Diagnosis  Osteomyelitis of left foot, unspecified type [facility]) [M86.9]  Discharge Diagnosis   Active Problems: Osteomyelitis of the second metatarsal Chronic atrial fibrillation Chronic systolic heart failure Relative hypotension Acute kidney injury on chronic kidney disease stage III Tobacco abuse    Hospital Course] Ruben Clark  is a 63 y.o. male with a known history of chronic combined systolic and diastolic CHF, hypertension, chronic atrial fibrillation, hyperlipidemia, COPD, tobacco use who was sent to the ED from his group home with increasing drainage from a chronic left foot wound.   Patient was admitted for osteomyelitis.  He was seen by podiatry who proceeded to On April 28he underwent second toe and partial ray amputation.  Patient subsequently had to be taken back to the OR for incision and drainage.  Patient was seen in consultation by infectious disease who recommended IV antibiotics with Zosyn.   Antibiotic duration and orders per ID Discharge antibiotics Zosyn              3.375   grams every    6 hours              PICC Care per protocol Labs weekly while on IV antibiotics -FAX weekly labs to [phone] CBC w diff         Comprehensive met panel CRP       Planned duration of antibiotics 4 weeks post op  Stop date        5/28     Follow up clinic date            2-3 weeks with Dr Dorma Russell, MD    Activity order LLE Weight Bearing: Partial weight bearing(through heel with orthowedge  Wound care: Bulky dry dressing to the surgical site    Consults  ID, podiatry  Significant Tests:  See full reports for all details    Mr Foot Left W Wo Contrast  Result Date: 08/02/2018 CLINICAL DATA:  Erythema and  swelling with chronic source of the left foot. Prior great toe amputation. Osteomyelitis. EXAM: MRI OF THE LEFT FOREFOOT WITHOUT AND WITH CONTRAST TECHNIQUE: Multiplanar, multisequence MR imaging of the left forefoot was performed both before and after administration of intravenous contrast. CONTRAST:  7.5 cc Gadavist COMPARISON:  Radiographs from 08/02/2018 FINDINGS: Bones/Joint/Cartilage Amputation of the first ray at the Lisfranc joint. Dorsal and proximal dislocation of the proximal phalanx of the second toe with respect to the second metatarsal. There is a large ulceration overlying the second metatarsal head probably with exposed bone, and bony destructive findings in the second metatarsal head with marrow edema and enhancement tracking in the second metatarsal head and shaft, with associated periosteal reaction. There is also appreciable marrow edema and enhancement in the base and proximal half of the proximal phalanx of the second toe which could be reactive or infectious. There is callus formation and focal edema and enhancement in the distal shaft of the third metatarsal, probably from a healing fracture and less likely to be due to active infection. Erosions or degenerative subcortical cyst formation along the Lisfranc joint at the bases of the second and third metatarsals. Ligaments The Lisfranc ligament is thickened. Muscles and Tendons Edema and enhancement in the plantar musculature of the foot and interosseous muscles  which could be neurogenic or due to infectious myositis. Soft tissues Irregular subcutaneous edema and enhancement surrounding the ulcer crater extending to the head of the second metatarsal. Subcutaneous edema and enhancement dorsally in the foot possibly from cellulitis. IMPRESSION: 1. Active osteomyelitis of the second metatarsal, with erosion of the metatarsal head and a large cutaneous ulceration extending to the bony margin. 2. Dorsal and proximal dislocation of the proximal  phalanx of the second toe with respect to the second metatarsal head. There is abnormal edema and enhancement in the base of the proximal phalanx which could be infectious or due to arthropathy. 3. Callus formation and periosteal reaction along a segment of the distal shaft of the third metatarsal, probably from a healing fracture, less likely from chronic infection. 4. Arthropathy along the Lisfranc joint especially at the second and third digits. 5. Generalized muscular edema in the foot could be neurogenic or from infectious myositis. 6. Subcutaneous edema and enhancement in the dorsum of the foot, probably from cellulitis. Or confluence cellulitis medially in the foot around the large ulcer crater. Electronically Signed   By: Van Clines M.D.   On: 08/02/2018 21:22   Dg Foot 2 Views Left  Result Date: 08/02/2018 CLINICAL DATA:  Left toe pain.  Amputation 4 weeks ago. EXAM: LEFT FOOT - 2 VIEW COMPARISON:  None. FINDINGS: To the first metatarsal was amputated. There is dislocation of the second MTP joint laterally. There is diffuse cortical thickening and periosteal reaction along the medial aspect of the second metatarsal with erosive changes distally. The third MTP joint demonstrates subluxation laterally as well. There is callus formation about the third metatarsal compatible with remote fracture. Diffuse soft tissue swelling is present about the ankle and foot. IMPRESSION: 1. Lateral dislocation of the second MTP joint. 2. Diffuse periosteal reaction along the medial aspect of the second metatarsal with erosive changes distally. This is concerning for osteomyelitis. 3. Extensive soft tissue swelling.  Infection is not excluded. 4. Remote fracture of the third metatarsal. Electronically Signed   By: San Morelle M.D.   On: 08/02/2018 09:56   [place] Ekg Site Rite  Result Date: 08/04/2018 If Site Rite image not attached, placement could not be confirmed due to current cardiac rhythm.       Today   Subjective:   Ruben Clark patient doing well denies any complaint Objective:   Blood pressure 123/79, pulse (!) 54, temperature 98.4 F (36.9 C), temperature source Oral, resp. rate 18, height 6' 1"  (1.854 m), weight 84.4 kg, SpO2 98 %.  .  Intake/Output Summary (Last 24 hours) at 08/09/2018 0904 Last data filed at 08/09/2018 0847 Gross per 24 hour  Intake 610.27 ml  Output 1375 ml  Net -764.73 ml    Exam VITAL SIGNS: Blood pressure 123/79, pulse (!) 54, temperature 98.4 F (36.9 C), temperature source Oral, resp. rate 18, height 6' 1"  (1.854 m), weight 84.4 kg, SpO2 98 %.  GENERAL:  63 y.o.-year-old patient lying in the bed with no acute distress.  EYES: Pupils equal, round, reactive to light and accommodation. No scleral icterus. Extraocular muscles intact.  HEENT: Head atraumatic, normocephalic. Oropharynx and nasopharynx clear.  NECK:  Supple, no jugular venous distention. No thyroid enlargement, no tenderness.  LUNGS: Normal breath sounds bilaterally, no wheezing, rales,rhonchi or crepitation. No use of accessory muscles of respiration.  CARDIOVASCULAR: S1, S2 normal. No murmurs, rubs, or gallops.  ABDOMEN: Soft, nontender, nondistended. Bowel sounds present. No organomegaly or mass.  EXTREMITIES: Dressing in place  in the left lower extremity NEUROLOGIC: Cranial nerves II through XII are intact. Muscle strength 5/5 in all extremities. Sensation intact. Gait not checked.  PSYCHIATRIC: The patient is alert and oriented x 3.  SKIN: No obvious rash, lesion, or ulcer.   Data Review     CBC w Diff:  Lab Results  Component Value Date   WBC 13.4 (H) 08/09/2018   HGB 12.1 (L) 08/09/2018   HCT 37.6 (L) 08/09/2018   PLT 271 08/09/2018   LYMPHOPCT 7 08/06/2018   MONOPCT 8 08/06/2018   EOSPCT 0 08/06/2018   BASOPCT 0 08/06/2018   CMP:  Lab Results  Component Value Date   NA 138 08/09/2018   K 3.5 08/09/2018   CL 100 08/09/2018   CO2 31 08/09/2018   BUN 19  08/09/2018   CREATININE 1.22 08/09/2018  .  Micro Results Recent Results (from the past 240 hour(s))  Culture, blood (routine x 2)     Status: None   Collection Time: 08/02/18  9:31 AM  Result Value Ref Range Status   Specimen Description BLOOD RIGHT ANTECUBITAL  Final   Special Requests   Final    BOTTLES DRAWN AEROBIC AND ANAEROBIC Blood Culture adequate volume   Culture   Final    NO GROWTH 5 DAYS Performed at [facility], [address] [place], [place] [address]    Report Status 08/07/2018 FINAL  Final  Culture, blood (routine x 2)     Status: None   Collection Time: 08/02/18  9:36 AM  Result Value Ref Range Status   Specimen Description BLOOD BLOOD RIGHT HAND  Final   Special Requests   Final    BOTTLES DRAWN AEROBIC AND ANAEROBIC Blood Culture adequate volume   Culture   Final    NO GROWTH 5 DAYS Performed at [facility], [address] [place], [place] [address]    Report Status 08/07/2018 FINAL  Final  Aerobic/Anaerobic Culture (surgical/deep wound)     Status: None   Collection Time: 08/03/18  1:05 PM  Result Value Ref Range Status   Specimen Description   Final    TOE LEFT GREAT TOE Performed at [facility], [address], [place], [place] [address]    Special Requests   Final    NONE Performed at [facility], [address], [place], [place] [address]    Gram Stain   Final    FEW WBC PRESENT, PREDOMINANTLY PMN NO ORGANISMS SEEN Performed at [facility], [place] [address], [place], [place] [address]    Culture   Final    RARE PASTEURELLA SPECIES RARE MORAXELLA SPECIES BETA LACTAMASE NEGATIVE RARE ALCALIGENES FAECALIS FEW FUSOBACTERIUM NUCLEATUM    Report Status 08/08/2018 FINAL  Final   Organism ID, Bacteria ALCALIGENES FAECALIS  Final      Susceptibility   Alcaligenes faecalis - MIC*    CEFEPIME 4 SENSITIVE Sensitive     CEFAZOLIN 32 INTERMEDIATE Intermediate     GENTAMICIN 2 SENSITIVE Sensitive      CIPROFLOXACIN 1 SENSITIVE Sensitive     IMIPENEM 0.5 SENSITIVE Sensitive     TRIMETH/SULFA <=20 SENSITIVE Sensitive     * RARE ALCALIGENES FAECALIS  Novel Coronavirus, NAA (hospital order; send-out to ref lab)     Status: None   Collection Time: 08/05/18  7:16 AM  Result Value Ref Range Status   SARS-CoV-2, NAA NOT DETECTED NOT DETECTED Final    Comment: (NOTE) This test was developed and  its performance characteristics determined by [employer]. This test has not been FDA cleared or approved. This test has been authorized by FDA under an Emergency Use Authorization (EUA). This test is only authorized for the duration of time the declaration that circumstances exist justifying the authorization of the emergency use of in vitro diagnostic tests for detection of SARS-CoV-2 virus and/or diagnosis of COVID-19 infection under section 564(b)(1) of the Act, 21 U.S.C. 580DXI-3(J)(8), unless the authorization is terminated or revoked sooner. When diagnostic testing is negative, the possibility of a false negative result should be considered in the context of a patient's recent exposures and the presence of clinical signs and symptoms consistent with COVID-19. An individual without symptoms of COVID-19 and who is not shedding SARS-CoV-2 virus would expect to have a negative (not detected) result in this assay. Performed  At: [facility] [address] [place], [place] [id number] Rush Farmer MD HA:[id number]    [place]  Final    Comment: Performed at [facility], [address] [place], [place] [address]        Code Status Orders  (From admission, onward)         Start     Ordered   08/02/18 1503  Full code  Continuous     08/02/18 1502        Code Status History    This patient has a current code status but no historical code status.    Advance Directive Documentation     Most Recent Value  Type of Advance  Directive  Healthcare Power of Attorney  Pre-existing out of facility DNR order (yellow form or pink MOST form)  -  "MOST" Form in Place?  -           Contact information for follow-up providers    Tracie Harrier, MD. Go to.   Specialty:  Internal Medicine Why:  New PCP appointment. Aug 10, 2018 @ 1;45 pm. Please bring insurance card and phot ID.  Contact information: [address] [place] [address] [phone]        Tsosie Billing, MD Follow up in 10 day(s).   Specialty:  Infectious Diseases Contact information: [place] [address] [phone]        Sharlotte Alamo, DPM Follow up in 1 week(s).   Specialty:  Podiatry Contact information: [place] [address] [phone]            Contact information for after-discharge care    [facility] [place] [facility] .   Service:  Skilled Nursing Contact information: [place] [phone]                  Discharge Medications   Allergies as of 08/09/2018   No Known Allergies     Medication List    TAKE these medications   apixaban 5 MG Tabs tablet Commonly known as:  ELIQUIS Take 1 tablet (5 mg total) by mouth 2 (two) times daily.   aspirin EC 81 MG tablet Take 81 mg by mouth daily.   digoxin 0.25 MG tablet Commonly known as:  LANOXIN Take 0.25 mg by mouth daily.   docusate sodium 100 MG capsule Commonly known as:  COLACE Take 100 mg by mouth 2 (two) times daily.   finasteride 5 MG tablet Commonly known as:  PROSCAR Take 1 tablet (5 mg total) by mouth daily.  furosemide 40 MG tablet Commonly known as:  LASIX Take 0.5 tablets (20 mg total) by mouth daily. What changed:  how much to take   gabapentin 300 MG capsule Commonly known as:  NEURONTIN Take 300 mg by mouth at bedtime.   HYDROcodone-acetaminophen 7.5-325 MG tablet Commonly  known as:  NORCO Take 1 tablet by mouth every 6 (six) hours as needed for moderate pain.   hydrOXYzine 25 MG tablet Commonly known as:  ATARAX/VISTARIL Take 25 mg by mouth every 8 (eight) hours as needed.   lisinopril 2.5 MG tablet Commonly known as:  ZESTRIL Take 2.5 mg by mouth daily.   LORazepam 0.5 MG tablet Commonly known as:  ATIVAN Take 0.5 tablets (0.25 mg total) by mouth daily.   metoprolol succinate 25 MG 24 hr tablet Commonly known as:  TOPROL-XL Take 1 tablet (25 mg total) by mouth daily. What changed:    medication strength  how much to take  additional instructions   nicotine 14 mg/24hr patch Commonly known as:  NICODERM CQ - dosed in mg/24 hours Place 1 patch (14 mg total) onto the skin daily.   piperacillin-tazobactam  IVPB Commonly known as:  ZOSYN Inject 3.375 g into the vein every 6 (six) hours for 26 days. Indication: foot wound and osteomyelitis Last Day of Therapy:  09/02/2018 Labs - Once weekly:  CBC/D, CRP and CMP            Home Infusion Instuctions  (From admission, onward)         Start     Ordered   08/09/18 0000  Home infusion instructions [facility] May follow [place] Dosing Protocol; May administer Cathflo as needed to maintain patency of vascular access device.; [place]ushing of vascular access device: per [facility] Protocol: 0.9% NaCl pre/post medica...    Question Answer Comment  Instructions May follow [place] Dosing Protocol   Instructions May administer Cathflo as needed to maintain patency of vascular access device.   Instructions Flushing of vascular access device: per [facility] Protocol: 0.9% NaCl pre/post medication administration and prn patency; Heparin 100 u/ml, 60m for implanted ports and Heparin 10u/ml, 556mfor all other central venous catheters.   Instructions May follow [facility] Anaphylaxis Protocol for First Dose Administration in the home: 0.9% NaCl at 25-50 ml/hr to maintain IV access for protocol meds. Epinephrine 0.3  ml IV/IM PRN and Benadryl 25-50 IV/IM PRN s/s of anaphylaxis.   Instructions [facility] Infusion Coordinator (RN) to assist per patient IV care needs in the home PRN.      08/09/18 0853             Total Time in preparing paper work, data evaluation and todays exam - 3572inutes  ShDustin Flock.D on 08/09/2018 at 9:04 AM [phone]

## 2018-08-09 NOTE — TOC Progression Note (Signed)
Transition of Care [facility]) - Progression Note    Patient Details  Name: Masson Nalepa MRN: [id number] Date of Birth: [birth year]  Transition of Care [facility]) CM/SW Contact  Su Hilt, RN Phone Number: 08/09/2018, 8:39 AM  Clinical Narrative:     [place] and started Authorization request for the patient to go to [facility] [place] anticipated today Called and left a Detailed Secure VM for [name] legal guardian at [phone] Requested a call back   Expected Discharge Plan: Group Home Barriers to Discharge: Continued Medical Work up  Expected Discharge Plan and Services Expected Discharge Plan: Group Home   Discharge Planning Services: CM Consult   Living arrangements for the past 2 months: Group Home                 DME Arranged: (none needed)         HH Arranged: RN, IV Antibiotics, PT HH Agency: [place] ([facility]) Date HH Agency Contacted: 08/06/18 Time HH Agency Contacted: 1113 Representative spoke with at [address] from [place] with infusion   Social Determinants of Health ([place]) Interventions    Readmission Risk Interventions No flowsheet data found.

## 2018-08-09 NOTE — TOC Progression Note (Signed)
Transition of Care [facility]) - Progression Note    Patient Details  Name: Naziah Weckerly MRN: [id number] Date of Birth: [birth year]  Transition of Care [facility]) CM/SW Contact  Su Hilt, RN Phone Number: 08/09/2018, 11:28 AM  Clinical Narrative:    Damaris Schooner to Claiborne Billings at [facility] the patient will be going to room 38 B bed side nurse to call report to [phone] I gave Claiborne Billings all of the Auth information and ref number [id number]    Expected Discharge Plan: Group Home Barriers to Discharge: Continued Medical Work up  Expected Discharge Plan and Services Expected Discharge Plan: Group Home   Discharge Planning Services: CM Consult   Living arrangements for the past 2 months: Group Home Expected Discharge Date: 08/09/18               DME Arranged: (none needed)         HH Arranged: RN, IV Antibiotics, PT HH Agency: [place] ([facility]) Date [facility] Agency Contacted: 08/06/18 Time HH Agency Contacted: 1113 Representative spoke with at [place] from [place] with infusion   Social Determinants of Health ([place]) Interventions    Readmission Risk Interventions No flowsheet data found.

## 2018-08-26 ENCOUNTER — Ambulatory Visit: Payer: Medicare HMO | Attending: Infectious Diseases | Admitting: Infectious Diseases

## 2018-08-26 ENCOUNTER — Encounter: Payer: Self-pay | Admitting: Infectious Diseases

## 2018-08-26 ENCOUNTER — Other Ambulatory Visit: Payer: Self-pay

## 2018-08-26 VITALS — BP 133/75 | HR 51 | Temp 97.7°F | Ht 72.0 in | Wt 195.0 lb

## 2018-08-26 DIAGNOSIS — Z89422 Acquired absence of other left toe(s): Secondary | ICD-10-CM

## 2018-08-26 DIAGNOSIS — Z89412 Acquired absence of left great toe: Secondary | ICD-10-CM

## 2018-08-26 DIAGNOSIS — Z720 Tobacco use: Secondary | ICD-10-CM

## 2018-08-26 DIAGNOSIS — L089 Local infection of the skin and subcutaneous tissue, unspecified: Secondary | ICD-10-CM

## 2018-08-26 DIAGNOSIS — M869 Osteomyelitis, unspecified: Secondary | ICD-10-CM | POA: Diagnosis not present

## 2018-08-26 DIAGNOSIS — I4891 Unspecified atrial fibrillation: Secondary | ICD-10-CM | POA: Insufficient documentation

## 2018-08-26 DIAGNOSIS — I5042 Chronic combined systolic (congestive) and diastolic (congestive) heart failure: Secondary | ICD-10-CM

## 2018-08-26 DIAGNOSIS — I482 Chronic atrial fibrillation, unspecified: Secondary | ICD-10-CM

## 2018-08-26 DIAGNOSIS — Z79899 Other long term (current) drug therapy: Secondary | ICD-10-CM

## 2018-08-26 DIAGNOSIS — I110 Hypertensive heart disease with heart failure: Secondary | ICD-10-CM

## 2018-08-26 DIAGNOSIS — J449 Chronic obstructive pulmonary disease, unspecified: Secondary | ICD-10-CM | POA: Insufficient documentation

## 2018-08-26 DIAGNOSIS — E785 Hyperlipidemia, unspecified: Secondary | ICD-10-CM

## 2018-08-26 DIAGNOSIS — B9689 Other specified bacterial agents as the cause of diseases classified elsewhere: Secondary | ICD-10-CM | POA: Diagnosis not present

## 2018-08-26 NOTE — Progress Notes (Signed)
NAME: Ruben Clark  DOB: [birth year]  MRN: [id number]  Date/Time: 08/26/2018 10:34 AM Here after hospital discharge- was in [facility] between 4/27-08/07/18 for a left foot wound and underwent ray amputation of the 2nd toe left foot by Dr.Cline and was discharged to rehab on IV zosyn 3.375grams Iv q 6. He is here for follow up. Doing well Says he has no fever, chills, foot pain, rash, diarrhea. He is on zosyn 3.375Grmas IV Q6- pt says he is tired of taking IV especially as it  is Q6 and he is being woken up midnight. Says the wound has healed well Medical history ?Elmer Sow a 63 y.o.malechronic combined systolic and diastolic CHF, hypertension, chronic atrial fibrillation, hyperlipidemia, COPD, tobacco use was recently in [facility] admitted with worsening wound on left foot. He was found to have open ulceration and osteomyelitis of the second metatarsal and second toe. On April 28he underwent second toe and partial ray amputation. bone margin was clear of osteomyeltiis. Culture was positive for a mixed organisms including fusobacterium, moraxella, pastuerella, species. He was seen by Dr.Fitgerald and was discharge on Zosyn 4 weeks until 5/28 to NH  Past Medical History:  Diagnosis Date  . CHF (congestive heart failure) ([place])   . Hypertension     Past Surgical History:  Procedure Laterality Date  . AMPUTATION Left 08/03/2018   Procedure: AMPUTATION RAY SECOND TOE;  Surgeon: Sharlotte Alamo, DPM;  Location: [facility] ORS;  Service: Podiatry;  Laterality: Left;  . IRRIGATION AND DEBRIDEMENT FOOT Left 08/05/2018   Procedure: IRRIGATION AND DEBRIDEMENT FOOT;  Surgeon: Sharlotte Alamo, DPM;  Location: [facility] ORS;  Service: Podiatry;  Laterality: Left;  . TOE AMPUTATION      SH Smoker Lives in a group home  No Known Allergies   FH- mom died of lung cancer Father died stomach cancer  ?  REVIEW OF SYSTEMS:  Const: negative fever, negative chills, negative weight loss Eyes: negative diplopia or visual changes,  negative eye pain ENT: negative coryza, negative sore throat Resp:baseline cough, no hemoptysis, dyspnea Cards: negative for chest pain, palpitations, lower extremity edema GU: negative for frequency, dysuria and hematuria GI: Negative for abdominal pain, diarrhea, bleeding, constipation Skin: negative for rash and pruritus Heme: negative for easy bruising and gum/nose bleeding MS: negative for myalgias, arthralgias, back pain and muscle weakness Neurolo:negative for headaches, dizziness, vertigo, memory problems  Psych: negative for feelings of anxiety, depression  Endocrine: negative for thyroid, diabetes Allergy/Immunology- negative for any medication or food allergies ?  Objective:  VITALS:  BP 133/75 (BP Location: Right Arm, Patient Position: Sitting, Cuff Size: Normal)   Pulse (!) 51   Temp 97.7 F (36.5 C) (Oral)   Ht 6' (1.829 m)   Wt 195 lb (88.5 kg)   BMI 26.45 kg/m  PHYSICAL EXAM:  General: Alert, cooperative, no distress, appears stated age. In a wheel chair Lungs: b/l air entry  Heart: irregular Abdomen: Soft, non-tender,not distended. Bowel sounds normal. No masses Extremities: left foot surgical site has healed well- no erythema or swelling, 1st and gn toe amputated      on admisison   post surgery    Skin: No rashes or lesions. Or bruising Lymph: Cervical, supraclavicular normal. Neurologic: Grossly non-focal Pertinent Labs Wbc 10.7 Cr 1.60 Hb 13.4  ? Impression/Recommendation ?  Left foot infection- s/p 2nd toe ray amputatiton- healed well- bone margin neg for osteo- he has completed 24 days of Iv zosyn- will change to augmentin 875 mg PO BID for 2 weeks ?He  is followe dby podiatrist Dr.Cline  Afib on digoxin  COPD on inhalers ? ___________________________________________________ Discussed with patient,instructions given to NH Follow PRN

## 2018-09-10 ENCOUNTER — Other Ambulatory Visit: Payer: Self-pay

## 2018-09-14 ENCOUNTER — Ambulatory Visit: Payer: Medicare HMO | Admitting: Family Medicine

## 2018-10-06 ENCOUNTER — Encounter: Payer: Self-pay | Admitting: Emergency Medicine

## 2018-10-06 ENCOUNTER — Other Ambulatory Visit: Payer: Self-pay

## 2018-10-06 DIAGNOSIS — Z1159 Encounter for screening for other viral diseases: Secondary | ICD-10-CM

## 2018-10-06 DIAGNOSIS — A419 Sepsis, unspecified organism: Principal | ICD-10-CM | POA: Diagnosis present

## 2018-10-06 DIAGNOSIS — R6520 Severe sepsis without septic shock: Secondary | ICD-10-CM | POA: Diagnosis present

## 2018-10-06 DIAGNOSIS — R197 Diarrhea, unspecified: Secondary | ICD-10-CM | POA: Diagnosis present

## 2018-10-06 DIAGNOSIS — F17200 Nicotine dependence, unspecified, uncomplicated: Secondary | ICD-10-CM | POA: Diagnosis present

## 2018-10-06 DIAGNOSIS — N390 Urinary tract infection, site not specified: Secondary | ICD-10-CM | POA: Diagnosis present

## 2018-10-06 DIAGNOSIS — Z79899 Other long term (current) drug therapy: Secondary | ICD-10-CM

## 2018-10-06 DIAGNOSIS — E860 Dehydration: Secondary | ICD-10-CM | POA: Diagnosis present

## 2018-10-06 DIAGNOSIS — I509 Heart failure, unspecified: Secondary | ICD-10-CM | POA: Diagnosis present

## 2018-10-06 DIAGNOSIS — N179 Acute kidney failure, unspecified: Secondary | ICD-10-CM | POA: Diagnosis present

## 2018-10-06 DIAGNOSIS — Z7982 Long term (current) use of aspirin: Secondary | ICD-10-CM

## 2018-10-06 DIAGNOSIS — B9562 Methicillin resistant Staphylococcus aureus infection as the cause of diseases classified elsewhere: Secondary | ICD-10-CM | POA: Diagnosis present

## 2018-10-06 DIAGNOSIS — R001 Bradycardia, unspecified: Secondary | ICD-10-CM | POA: Diagnosis present

## 2018-10-06 DIAGNOSIS — R296 Repeated falls: Secondary | ICD-10-CM | POA: Diagnosis present

## 2018-10-06 DIAGNOSIS — I110 Hypertensive heart disease with heart failure: Secondary | ICD-10-CM | POA: Diagnosis present

## 2018-10-06 DIAGNOSIS — Z7901 Long term (current) use of anticoagulants: Secondary | ICD-10-CM

## 2018-10-06 LAB — CBC
HCT: 39.6 % (ref 39.0–52.0)
Hemoglobin: 12.9 g/dL — ABNORMAL LOW (ref 13.0–17.0)
MCH: 29.7 pg (ref 26.0–34.0)
MCHC: 32.6 g/dL (ref 30.0–36.0)
MCV: 91 fL (ref 80.0–100.0)
Platelets: 318 10*3/uL (ref 150–400)
RBC: 4.35 MIL/uL (ref 4.22–5.81)
RDW: 13.9 % (ref 11.5–15.5)
WBC: 20.6 10*3/uL — ABNORMAL HIGH (ref 4.0–10.5)
nRBC: 0 % (ref 0.0–0.2)

## 2018-10-06 LAB — BASIC METABOLIC PANEL
Anion gap: 10 (ref 5–15)
BUN: 52 mg/dL — ABNORMAL HIGH (ref 8–23)
CO2: 23 mmol/L (ref 22–32)
Calcium: 12.4 mg/dL — ABNORMAL HIGH (ref 8.9–10.3)
Chloride: 101 mmol/L (ref 98–111)
Creatinine, Ser: 2.63 mg/dL — ABNORMAL HIGH (ref 0.61–1.24)
GFR calc Af Amer: 29 mL/min — ABNORMAL LOW (ref >60)
GFR calc non Af Amer: 25 mL/min — ABNORMAL LOW (ref >60)
Glucose, Bld: 105 mg/dL — ABNORMAL HIGH (ref 70–99)
Potassium: 4.8 mmol/L (ref 3.5–5.1)
Sodium: 134 mmol/L — ABNORMAL LOW (ref 135–145)

## 2018-10-06 LAB — LACTIC ACID, PLASMA: Lactic Acid, Venous: 1.9 mmol/L (ref 0.5–1.9)

## 2018-10-06 NOTE — ED Notes (Signed)
Pt arrived via EMS from [facility].

## 2018-10-06 NOTE — ED Triage Notes (Signed)
Patient coming in EMS from group home. Says he has been falling a lot and thinks he might have a UTI. States he is in no pain.

## 2018-10-07 ENCOUNTER — Inpatient Hospital Stay
Admission: EM | Admit: 2018-10-07 | Discharge: 2018-10-11 | DRG: 872 | Disposition: A | Discharge status: Home Health | Payer: Medicare HMO | Attending: Internal Medicine | Admitting: Internal Medicine

## 2018-10-07 ENCOUNTER — Other Ambulatory Visit: Payer: Self-pay

## 2018-10-07 ENCOUNTER — Emergency Department: Payer: Medicare HMO

## 2018-10-07 DIAGNOSIS — Z1159 Encounter for screening for other viral diseases: Secondary | ICD-10-CM | POA: Diagnosis not present

## 2018-10-07 DIAGNOSIS — N179 Acute kidney failure, unspecified: Secondary | ICD-10-CM

## 2018-10-07 DIAGNOSIS — A419 Sepsis, unspecified organism: Secondary | ICD-10-CM | POA: Diagnosis present

## 2018-10-07 DIAGNOSIS — Z79899 Other long term (current) drug therapy: Secondary | ICD-10-CM | POA: Diagnosis not present

## 2018-10-07 DIAGNOSIS — R001 Bradycardia, unspecified: Secondary | ICD-10-CM | POA: Diagnosis present

## 2018-10-07 DIAGNOSIS — Z7901 Long term (current) use of anticoagulants: Secondary | ICD-10-CM | POA: Diagnosis not present

## 2018-10-07 DIAGNOSIS — B9562 Methicillin resistant Staphylococcus aureus infection as the cause of diseases classified elsewhere: Secondary | ICD-10-CM | POA: Diagnosis present

## 2018-10-07 DIAGNOSIS — R197 Diarrhea, unspecified: Secondary | ICD-10-CM | POA: Diagnosis present

## 2018-10-07 DIAGNOSIS — I110 Hypertensive heart disease with heart failure: Secondary | ICD-10-CM | POA: Diagnosis present

## 2018-10-07 DIAGNOSIS — E860 Dehydration: Secondary | ICD-10-CM | POA: Diagnosis present

## 2018-10-07 DIAGNOSIS — N390 Urinary tract infection, site not specified: Secondary | ICD-10-CM | POA: Diagnosis present

## 2018-10-07 DIAGNOSIS — R6520 Severe sepsis without septic shock: Secondary | ICD-10-CM | POA: Diagnosis present

## 2018-10-07 DIAGNOSIS — R296 Repeated falls: Secondary | ICD-10-CM | POA: Diagnosis present

## 2018-10-07 DIAGNOSIS — Z7982 Long term (current) use of aspirin: Secondary | ICD-10-CM | POA: Diagnosis not present

## 2018-10-07 DIAGNOSIS — I509 Heart failure, unspecified: Secondary | ICD-10-CM | POA: Diagnosis present

## 2018-10-07 DIAGNOSIS — F17200 Nicotine dependence, unspecified, uncomplicated: Secondary | ICD-10-CM | POA: Diagnosis present

## 2018-10-07 LAB — URINALYSIS, COMPLETE (UACMP) WITH MICROSCOPIC
Bacteria, UA: NONE SEEN
Bilirubin Urine: NEGATIVE
Glucose, UA: NEGATIVE mg/dL
Ketones, ur: NEGATIVE mg/dL
Nitrite: NEGATIVE
Protein, ur: 100 mg/dL — AB
Specific Gravity, Urine: 1.01 (ref 1.005–1.030)
WBC, UA: >50 WBC/hpf — ABNORMAL HIGH (ref 0–5)
pH: 6 (ref 5.0–8.0)

## 2018-10-07 LAB — MRSA PCR SCREENING: MRSA by PCR: POSITIVE — AB

## 2018-10-07 LAB — BASIC METABOLIC PANEL
Anion gap: 8 (ref 5–15)
BUN: 55 mg/dL — ABNORMAL HIGH (ref 8–23)
CO2: 22 mmol/L (ref 22–32)
Calcium: 11.6 mg/dL — ABNORMAL HIGH (ref 8.9–10.3)
Chloride: 106 mmol/L (ref 98–111)
Creatinine, Ser: 2.89 mg/dL — ABNORMAL HIGH (ref 0.61–1.24)
GFR calc Af Amer: 26 mL/min — ABNORMAL LOW (ref >60)
GFR calc non Af Amer: 22 mL/min — ABNORMAL LOW (ref >60)
Glucose, Bld: 108 mg/dL — ABNORMAL HIGH (ref 70–99)
Potassium: 4.7 mmol/L (ref 3.5–5.1)
Sodium: 136 mmol/L (ref 135–145)

## 2018-10-07 LAB — CBC
HCT: 40 % (ref 39.0–52.0)
Hemoglobin: 12.5 g/dL — ABNORMAL LOW (ref 13.0–17.0)
MCH: 29.3 pg (ref 26.0–34.0)
MCHC: 31.3 g/dL (ref 30.0–36.0)
MCV: 93.9 fL (ref 80.0–100.0)
Platelets: 304 10*3/uL (ref 150–400)
RBC: 4.26 MIL/uL (ref 4.22–5.81)
RDW: 14.2 % (ref 11.5–15.5)
WBC: 20.5 10*3/uL — ABNORMAL HIGH (ref 4.0–10.5)
nRBC: 0 % (ref 0.0–0.2)

## 2018-10-07 LAB — SARS CORONAVIRUS 2 BY RT PCR (HOSPITAL ORDER, PERFORMED IN ~~LOC~~ HOSPITAL LAB): SARS Coronavirus 2: NEGATIVE

## 2018-10-07 LAB — CORTISOL-AM, BLOOD: Cortisol - AM: 26.4 ug/dL — ABNORMAL HIGH (ref 6.7–22.6)

## 2018-10-07 LAB — LACTIC ACID, PLASMA
Lactic Acid, Venous: 1.7 mmol/L (ref 0.5–1.9)
Lactic Acid, Venous: 1.7 mmol/L (ref 0.5–1.9)

## 2018-10-07 LAB — PROTIME-INR
INR: 1.5 — ABNORMAL HIGH (ref 0.8–1.2)
Prothrombin Time: 17.6 seconds — ABNORMAL HIGH (ref 11.4–15.2)

## 2018-10-07 LAB — DIGOXIN LEVEL: Digoxin Level: 1.8 ng/mL (ref 0.8–2.0)

## 2018-10-07 LAB — PROCALCITONIN
Procalcitonin: 0.32 ng/mL
Procalcitonin: 0.38 ng/mL

## 2018-10-07 MED ORDER — CHLORHEXIDINE GLUCONATE CLOTH 2 % EX PADS
6.0000 | MEDICATED_PAD | Freq: Every day | CUTANEOUS | Status: DC
  Administered 2018-10-08 – 2018-10-10 (×3): 6 via TOPICAL

## 2018-10-07 MED ORDER — ONDANSETRON HCL 4 MG/2ML IJ SOLN: 4.0000 mg | Freq: Four times a day (QID) | INTRAMUSCULAR | Status: DC | PRN

## 2018-10-07 MED ORDER — ACETAMINOPHEN 325 MG PO TABS
650.0000 mg | ORAL_TABLET | Freq: Four times a day (QID) | ORAL | Status: DC | PRN
  Administered 2018-10-08: 650 mg via ORAL
  Filled 2018-10-07: qty 2

## 2018-10-07 MED ORDER — LORAZEPAM 0.5 MG PO TABS
0.2500 mg | ORAL_TABLET | Freq: Every day | ORAL | Status: DC
  Administered 2018-10-07 – 2018-10-11 (×5): 0.25 mg via ORAL
  Filled 2018-10-07 (×5): qty 1

## 2018-10-07 MED ORDER — FINASTERIDE 5 MG PO TABS
5.0000 mg | ORAL_TABLET | Freq: Every day | ORAL | Status: DC
  Administered 2018-10-07 – 2018-10-11 (×5): 5 mg via ORAL
  Filled 2018-10-07 (×5): qty 1

## 2018-10-07 MED ORDER — SODIUM CHLORIDE 0.9 % IV BOLUS (SEPSIS)
1000.0000 mL | Freq: Once | INTRAVENOUS | Status: AC
  Administered 2018-10-07: 01:00:00 1000 mL via INTRAVENOUS

## 2018-10-07 MED ORDER — POLYETHYLENE GLYCOL 3350 17 G PO PACK: 17.0000 g | PACK | Freq: Every day | ORAL | Status: DC | PRN

## 2018-10-07 MED ORDER — SODIUM CHLORIDE 0.9 % IV BOLUS
500.0000 mL | Freq: Once | INTRAVENOUS | Status: AC
  Administered 2018-10-07: 500 mL via INTRAVENOUS

## 2018-10-07 MED ORDER — SODIUM CHLORIDE 0.9 % IV SOLN
INTRAVENOUS | Status: DC
  Administered 2018-10-07 – 2018-10-09 (×4): via INTRAVENOUS

## 2018-10-07 MED ORDER — ACETAMINOPHEN 650 MG RE SUPP: 650.0000 mg | Freq: Four times a day (QID) | RECTAL | Status: DC | PRN

## 2018-10-07 MED ORDER — ASPIRIN EC 81 MG PO TBEC
81.0000 mg | DELAYED_RELEASE_TABLET | Freq: Every day | ORAL | Status: DC
  Administered 2018-10-07 – 2018-10-11 (×5): 81 mg via ORAL
  Filled 2018-10-07 (×5): qty 1

## 2018-10-07 MED ORDER — SODIUM CHLORIDE 0.9 % IV BOLUS (SEPSIS): 250.0000 mL | Freq: Once | INTRAVENOUS | Status: DC

## 2018-10-07 MED ORDER — MUPIROCIN 2 % EX OINT
1.0000 "application " | TOPICAL_OINTMENT | Freq: Two times a day (BID) | CUTANEOUS | Status: DC
  Administered 2018-10-07 – 2018-10-11 (×7): 1 via NASAL
  Filled 2018-10-07: qty 22

## 2018-10-07 MED ORDER — FUROSEMIDE 20 MG PO TABS
20.0000 mg | ORAL_TABLET | Freq: Every day | ORAL | Status: DC
  Administered 2018-10-08 – 2018-10-09 (×2): 20 mg via ORAL
  Filled 2018-10-07 (×3): qty 1

## 2018-10-07 MED ORDER — GABAPENTIN 300 MG PO CAPS
300.0000 mg | ORAL_CAPSULE | Freq: Every day | ORAL | Status: DC
  Administered 2018-10-07 – 2018-10-10 (×4): 300 mg via ORAL
  Filled 2018-10-07 (×4): qty 1

## 2018-10-07 MED ORDER — ENOXAPARIN SODIUM 40 MG/0.4ML ~~LOC~~ SOLN: 40.0000 mg | SUBCUTANEOUS | Status: DC

## 2018-10-07 MED ORDER — METOPROLOL SUCCINATE ER 25 MG PO TB24
25.0000 mg | ORAL_TABLET | Freq: Every day | ORAL | Status: DC
  Administered 2018-10-08: 25 mg via ORAL
  Filled 2018-10-07 (×3): qty 1

## 2018-10-07 MED ORDER — SODIUM CHLORIDE 0.9% FLUSH
3.0000 mL | Freq: Two times a day (BID) | INTRAVENOUS | Status: DC
  Administered 2018-10-07 – 2018-10-09 (×2): 3 mL via INTRAVENOUS

## 2018-10-07 MED ORDER — SODIUM CHLORIDE 0.9 % IV BOLUS (SEPSIS)
1000.0000 mL | Freq: Once | INTRAVENOUS | Status: AC
  Administered 2018-10-07: 1000 mL via INTRAVENOUS

## 2018-10-07 MED ORDER — DIGOXIN 250 MCG PO TABS
0.2500 mg | ORAL_TABLET | Freq: Every day | ORAL | Status: DC
  Administered 2018-10-07 – 2018-10-08 (×2): 0.25 mg via ORAL
  Filled 2018-10-07 (×3): qty 1

## 2018-10-07 MED ORDER — ONDANSETRON HCL 4 MG PO TABS: 4.0000 mg | ORAL_TABLET | Freq: Four times a day (QID) | ORAL | Status: DC | PRN

## 2018-10-07 MED ORDER — SODIUM CHLORIDE 0.9 % IV SOLN
1.0000 g | INTRAVENOUS | Status: DC
  Administered 2018-10-07 – 2018-10-09 (×3): 1 g via INTRAVENOUS
  Filled 2018-10-07: qty 10
  Filled 2018-10-07: qty 1
  Filled 2018-10-07: qty 10
  Filled 2018-10-07: qty 1

## 2018-10-07 MED ORDER — SODIUM CHLORIDE 0.9 % IV SOLN: 1.0000 g | INTRAVENOUS | Status: DC

## 2018-10-07 MED ORDER — LISINOPRIL 5 MG PO TABS: 2.5000 mg | ORAL_TABLET | Freq: Every day | ORAL | Status: DC

## 2018-10-07 MED ORDER — HYDROCODONE-ACETAMINOPHEN 5-325 MG PO TABS
1.0000 | ORAL_TABLET | ORAL | Status: DC | PRN
  Administered 2018-10-09: 1 via ORAL
  Filled 2018-10-07: qty 1

## 2018-10-07 MED ORDER — APIXABAN 5 MG PO TABS
5.0000 mg | ORAL_TABLET | Freq: Two times a day (BID) | ORAL | Status: DC
  Administered 2018-10-07 – 2018-10-11 (×9): 5 mg via ORAL
  Filled 2018-10-07 (×9): qty 1

## 2018-10-07 MED ORDER — DOCUSATE SODIUM 100 MG PO CAPS
100.0000 mg | ORAL_CAPSULE | Freq: Two times a day (BID) | ORAL | Status: DC
  Administered 2018-10-07 – 2018-10-11 (×7): 100 mg via ORAL
  Filled 2018-10-07 (×7): qty 1

## 2018-10-07 NOTE — Progress Notes (Signed)
CODE SEPSIS - PHARMACY COMMUNICATION  **Broad Spectrum Antibiotics should be administered within 1 hour of Sepsis diagnosis**  Time Code Sepsis Called/Page Received: 0206  Antibiotics Ordered: ceftriaxone  Time of 1st antibiotic administration: 0349  Additional action taken by pharmacy:   If necessary, Name of Provider/Nurse Contacted:     Tobie Lords ,PharmD Clinical Pharmacist  10/07/2018  6:21 AM

## 2018-10-07 NOTE — Progress Notes (Signed)
BP low. Dr Estanislado Pandy notified. Order received to hold am metoprolol and lasix

## 2018-10-07 NOTE — Progress Notes (Signed)
Advanced care plan. Purpose of the Encounter: CODE STATUS Parties in Attendance: Patient Patient's Decision Capacity: Good Subjective/Patient's story: Ruben Clark  is a 63 y.o. male with a known history of CHF and hypertension.  He presented from the group home with a report of recent frequent falls.  Patient reports he has been weak with poor appetite over the last week resulting in frequent falls.  Patient is complaining of being unable to void with lower abdominal pain.  He has noted no hematuria.  No fevers, chills, nausea, vomiting, diarrhea.  He denies chest pain or shortness of breath.  Urinalysis demonstrates moderate leukocytes with greater than 50 WBCs with turbid urine which appears to contain puslike drainage.  He was started on IV antibiotic with Rocephin in the emergency room.  This has been continued.  Urine culture is pending.  Patient also noted to be hypotensive in the emergency room with blood pressure the 8/57.  Leukocytosis with WBC 20.6 and lactic acid 1.7.  Additionally, there is evidence of acute kidney injury with BUN of 52 and creatinine 2.63.  He has been admitted to the hospital service for sepsis with urinary tract infection. Objective/Medical story Patient needs IV antibiotics for sepsis secondary to urinary tract infection.  Needs aggressive IV fluids for sepsis and acute kidney injury. Goals of care determination:  Advance care directives goals of care and treatment plan discussed Patient wants everything done which includes CPR, intubation ventilator if the need arises. CODE STATUS: Full code Time spent discussing advanced care planning: 16 minutes

## 2018-10-07 NOTE — Progress Notes (Signed)
BP 88/50, heart rate 52. Order received for NS 53ml bolus from Dr Estanislado Pandy

## 2018-10-07 NOTE — ED Provider Notes (Signed)
[facility] Emergency Department Provider Note  ____________________________________________   First MD Initiated Contact with Patient 10/07/18 0040     (approximate)  I have reviewed the triage vital signs and the nursing notes.   HISTORY  Chief Complaint Urinary Tract Infection    HPI Ruben Clark is a 63 y.o. male with medical history as listed below who comes from a group home  and presents for evaluation of nephritis symptoms that includes generalized weakness, difficulty walking due to the weakness, decreased urinary output, and decreased oral intake due to lack of appetite.  He reports that he thinks he has a urinary tract infection but he has had no burning when he urinates.  He he has gone to the bathroom less often today and has not gone in the 5 and half hours that he has been waiting in the lobby of the ED.  He denies fever, chest pain, cough, shortness of breath, nausea, vomiting, and abdominal pain as well as dysuria.  He says he has little bit of a sore throat but he says that he always has a sore throat.  He has maybe had some chills.  He describes his overall symptoms as severe and nothing in particular makes them better or worse        Past Medical History:  Diagnosis Date   CHF (congestive heart failure) ([place])    Hypertension     Patient Active Problem List   Diagnosis Date Noted   COPD (chronic obstructive pulmonary disease) ([place]) 08/26/2018   Atrial fibrillation ([place]) 08/26/2018   Left foot infection 08/02/2018    Past Surgical History:  Procedure Laterality Date   AMPUTATION Left 08/03/2018   Procedure: AMPUTATION RAY SECOND TOE;  Surgeon: Sharlotte Alamo, DPM;  Location: [facility] ORS;  Service: Podiatry;  Laterality: Left;   IRRIGATION AND DEBRIDEMENT FOOT Left 08/05/2018   Procedure: IRRIGATION AND DEBRIDEMENT FOOT;  Surgeon: Sharlotte Alamo, DPM;  Location: [facility] ORS;  Service: Podiatry;  Laterality: Left;   TOE AMPUTATION       Prior to Admission medications   Medication Sig Start Date End Date Taking? Authorizing Provider  apixaban (ELIQUIS) 5 MG TABS tablet Take 1 tablet (5 mg total) by mouth 2 (two) times daily. 08/09/18   Dustin Flock, MD  aspirin EC 81 MG tablet Take 81 mg by mouth daily.    [provider]  digoxin (LANOXIN) 0.25 MG tablet Take 0.25 mg by mouth daily.    [provider]  docusate sodium (COLACE) 100 MG capsule Take 100 mg by mouth 2 (two) times daily.    [provider]  finasteride (PROSCAR) 5 MG tablet Take 1 tablet (5 mg total) by mouth daily. 08/09/18   Dustin Flock, MD  furosemide (LASIX) 40 MG tablet Take 0.5 tablets (20 mg total) by mouth daily. 08/09/18   Dustin Flock, MD  gabapentin (NEURONTIN) 300 MG capsule Take 300 mg by mouth at bedtime.    [provider]  HYDROcodone-acetaminophen (NORCO) 7.5-325 MG tablet Take 1 tablet by mouth every 6 (six) hours as needed for moderate pain. 08/09/18   Dustin Flock, MD  hydrOXYzine (ATARAX/VISTARIL) 25 MG tablet Take 25 mg by mouth every 8 (eight) hours as needed.    [provider]  lisinopril (ZESTRIL) 2.5 MG tablet Take 2.5 mg by mouth daily.    [provider]  LORazepam (ATIVAN) 0.5 MG tablet Take 0.5 tablets (0.25 mg total) by mouth daily. 08/09/18   Dustin Flock, MD  metoprolol succinate (TOPROL-XL) 25 MG 24 hr tablet Take 1 tablet (25 mg total) by mouth daily. 08/09/18   Dustin Flock, MD  nicotine (NICODERM CQ - DOSED IN MG/24 HOURS) 14 mg/24hr patch Place 1 patch (14 mg total) onto the skin daily. 08/09/18   Dustin Flock, MD    Allergies Patient has no known allergies.  No family history on file.  Social History Social History   Tobacco Use   Smoking status: Current Every Day Smoker   Smokeless tobacco: Never Used  Substance Use Topics   Alcohol use: Never    Frequency: Never   Drug use: Never    Review of Systems Constitutional: No fever, some chills.   General malaise and fatigue.  Generalized weakness. Eyes: No visual changes. ENT: A sore throat that he says is "all the time". Cardiovascular: Denies chest pain. Respiratory: Denies shortness of breath. Gastrointestinal: No abdominal pain.  No nausea, no vomiting.  No diarrhea.  No constipation. Genitourinary: Decreased urinary output today.  Negative for dysuria. Musculoskeletal: Negative for neck pain.  Negative for back pain. Integumentary: Negative for rash. Neurological: Generalized weakness.  Negative for headaches, focal weakness or numbness.   ____________________________________________   PHYSICAL EXAM:  VITAL SIGNS: ED Triage Vitals  Enc Vitals Group     BP 10/06/18 1936 (!) 90/52     Pulse Rate 10/06/18 1936 95     Resp 10/06/18 1936 18     Temp 10/06/18 1936 99.4 F (37.4 C)     Temp Source 10/06/18 1936 Oral     SpO2 10/06/18 1936 98 %     Weight 10/06/18 1944 72.6 kg (160 lb)     Height 10/06/18 1944 1.727 m (5\' 8" )     Head Circumference --      Peak Flow --      Pain Score 10/06/18 1939 0     Pain Loc --      Pain Edu? --      Excl. in [place]? --     Constitutional: Alert and oriented.  Appears somewhat chronically ill but is in no acute distress. Eyes: Conjunctivae are normal.  Head: Atraumatic. Nose: No congestion/rhinnorhea. Mouth/Throat: Mucous membranes are dry. Neck: No stridor.  No meningeal signs.   Cardiovascular: Normal rate, regular rhythm. Good peripheral circulation. Grossly normal heart sounds. Respiratory: Normal respiratory effort.  No retractions. No audible wheezing. Gastrointestinal: Soft and nontender. No distention.  Musculoskeletal: No lower extremity tenderness nor edema. No gross deformities of extremities. Neurologic:  Normal speech and language. No gross focal neurologic deficits are appreciated.  Skin:  Skin is warm, dry and intact. No rash noted. Psychiatric: Mood and affect are normal. Speech and behavior are  normal.  ____________________________________________   LABS (all labs ordered are listed, but only abnormal results are displayed)  Labs Reviewed  URINALYSIS, COMPLETE (UACMP) WITH MICROSCOPIC - Abnormal; Notable for the following components:      Result Value   Color, Urine YELLOW (*)    APPearance TURBID (*)    Hgb urine dipstick MODERATE (*)    Protein, ur 100 (*)    Leukocytes,Ua MODERATE (*)    WBC, UA >50 (*)    All other components within normal limits  CBC - Abnormal; Notable for the following components:   WBC 20.6 (*)    Hemoglobin 12.9 (*)    All other components within normal limits  BASIC METABOLIC PANEL - Abnormal; Notable for the following components:   Sodium 134 (*)  Glucose, Bld 105 (*)    BUN 52 (*)    Creatinine, Ser 2.63 (*)    Calcium 12.4 (*)    GFR calc non Af Amer 25 (*)    GFR calc Af Amer 29 (*)    All other components within normal limits  SARS CORONAVIRUS 2 (HOSPITAL ORDER, [place])  URINE CULTURE  CULTURE, BLOOD (ROUTINE X 2)  CULTURE, BLOOD (ROUTINE X 2)  LACTIC ACID, PLASMA  LACTIC ACID, PLASMA  PROCALCITONIN  LACTIC ACID, PLASMA   ____________________________________________  EKG  ED ECG REPORT I, Hinda Kehr, the attending physician, personally viewed and interpreted this ECG.  Date: 10/07/2018 EKG Time: 1:27 AM Rate: 86 Rhythm: normal sinus rhythm with artifact QRS Axis: normal Intervals: normal ST/T Wave abnormalities: Non-specific ST segment / T-wave changes, but no clear evidence of acute ischemia. Narrative Interpretation: no definitive evidence of acute ischemia; does not meet STEMI criteria.   ____________________________________________  RADIOLOGY I, Hinda Kehr, personally viewed and evaluated these images (plain radiographs) as part of my medical decision making, as well as reviewing the written report by the radiologist.  ED MD interpretation: No indication of acute chest  abnormality  Official radiology report(s): Dg Chest Port 1 View  Result Date: 10/07/2018 CLINICAL DATA:  Sepsis EXAM: PORTABLE CHEST 1 VIEW COMPARISON:  None. FINDINGS: Heart and mediastinal contours are within normal limits. No focal opacities or effusions. No acute bony abnormality. IMPRESSION: No active disease. Electronically Signed   By: Rolm Baptise M.D.   On: 10/07/2018 01:20    ____________________________________________   PROCEDURES   Procedure(s) performed (including Critical Care):  .Critical Care Performed by: Hinda Kehr, MD Authorized by: Hinda Kehr, MD   Critical care provider statement:    Critical care time (minutes):  30   Critical care time was exclusive of:  Separately billable procedures and treating other patients   Critical care was necessary to treat or prevent imminent or life-threatening deterioration of the following conditions:  Sepsis   Critical care was time spent personally by me on the following activities:  Development of treatment plan with patient or surrogate, discussions with consultants, evaluation of patient's response to treatment, examination of patient, obtaining history from patient or surrogate, ordering and performing treatments and interventions, ordering and review of laboratory studies, ordering and review of radiographic studies, pulse oximetry, re-evaluation of patient's condition and review of old charts     ____________________________________________   [place] / MDM / [place] / ED COURSE  As part of my medical decision making, I reviewed the following data within the [place] notes reviewed and incorporated, Labs reviewed , Radiograph reviewed , Discussed with admitting physician  and Notes from prior ED visits        Differential diagnosis includes, but is not limited to, volume depletion/dehydration, acute renal failure or acute kidney injury, urinary tract  infection/pyelonephritis, obstructive kidney stone, COVID-19 or other nonspecific infection.  The patient appears somewhat chronically ill but is in no distress.  However he has not had much urination today.  The majority of his work-up was done by the time he came into an exam room.  He has acute renal failure with a creatinine of 2.6 greater than his baseline of 1.2 with a BUN of 52.  He has a leukocytosis of 20.6.  He has not been able to provide a urine specimen so I have ordered and out catheterization.  I am holding off  on antibiotics until I see the urine specimen has come back.  He has no respiratory symptoms but given his chills, his group home status, and his "chronic" sore throat, I am sending a rapid COVID-19 swab.  I have ordered 30 mL/kg of normal saline IV bolus.  The patient will require admission for his acute renal failure as well as probable urinary tract infection.  I considered a CT scan of the abdomen and pelvis but he is reporting no pain and I think that an issue such as a retained and obstructive infected ureteral stone is very unlikely in the absence of pain.   Clinical Course as of Oct 07 338  Thu Oct 07, 2018  0122 elevated but not indicative of severe sepsis  Procalcitonin: 0.32 [CF]  0125 No active disease  DG Chest Port 1 View [CF]  0240 Coronavirus is negative.  Patient is asymptomatic and getting 30 mL/kg of normal saline.  Received ceftriaxone 1 g IV as per community-acquired urinary tract infection sepsis protocol.  Sent a message regarding admission to the two hospitalists, Dr. Sidney Ace and Gardiner Barefoot.   [CF]  [phone] Dr. Sidney Ace confirmed receipt of admission notification.    [CF]  1191 Of note, the patient's second lactic acid was still within normal limits.   [CF]    Clinical Course User Index [CF] Hinda Kehr, MD     ____________________________________________  FINAL CLINICAL IMPRESSION(S) / ED DIAGNOSES  Final diagnoses:  Sepsis with acute renal  failure without septic shock, due to unspecified organism, unspecified acute renal failure type ([place])  Urinary tract infection without hematuria, site unspecified  Acute renal failure, unspecified acute renal failure type ([place])     MEDICATIONS GIVEN DURING THIS VISIT:  Medications  sodium chloride 0.9 % bolus 1,000 mL (has no administration in time range)    And  sodium chloride 0.9 % bolus 1,000 mL (1,000 mLs Intravenous New Bag/Given 10/07/18 0122)    And  sodium chloride 0.9 % bolus 250 mL (has no administration in time range)  cefTRIAXone (ROCEPHIN) 1 g in sodium chloride 0.9 % 100 mL IVPB (has no administration in time range)     ED Discharge Orders    None      *Please note:  Eman Morimoto was evaluated in Emergency Department on 10/07/2018 for the symptoms described in the history of present illness. He was evaluated in the context of the global COVID-19 pandemic, which necessitated consideration that the patient might be at risk for infection with the SARS-CoV-2 virus that causes COVID-19. Institutional protocols and algorithms that pertain to the evaluation of patients at risk for COVID-19 are in a state of rapid change based on information released by regulatory bodies including the CDC and federal and state organizations. These policies and algorithms were followed during the patient's care in the ED.  Some ED evaluations and interventions may be delayed as a result of limited staffing during the pandemic.*  Note:  This document was prepared using Dragon voice recognition software and may include unintentional dictation errors.   Hinda Kehr, MD 10/07/18 [phone]

## 2018-10-07 NOTE — ED Notes (Signed)
ED TO INPATIENT HANDOFF REPORT  ED Nurse Name and Phone #:  [name] 63 y.o. male Room/Bed: ED12A/ED12A  Code Status   Code Status: Full Code  Home/SNF/Other group home Patient oriented to: self, place and situation Is this baseline? Yes   Triage Complete: Triage complete  Chief Complaint EMS Poss bladder infection  Triage Note Patient coming in EMS from group home. Says he has been falling a lot and thinks he might have a UTI. States he is in no pain.    Allergies No Known Allergies  Level of Care/Admitting Diagnosis ED Disposition    ED Disposition Condition [facility] Area: [place] [[id number]]  Level of Care: Med-Surg [16]  Covid Evaluation: Confirmed COVID Positive  Diagnosis: Sepsis [facility]) [[id number]]  Admitting Physician: Mayer Camel [[id number]]  Attending Physician: Mayer Camel [[id number]]  Estimated length of stay: past midnight tomorrow  Certification:: I certify this patient will need inpatient services for at least 2 midnights  PT Class (Do Not Modify): Inpatient [101]  PT Acc Code (Do Not Modify): Private [1]       B Medical/Surgery History Past Medical History:  Diagnosis Date  . CHF (congestive heart failure) ([place])   . Hypertension    Past Surgical History:  Procedure Laterality Date  . AMPUTATION Left 08/03/2018   Procedure: AMPUTATION RAY SECOND TOE;  Surgeon: Sharlotte Alamo, DPM;  Location: [facility] ORS;  Service: Podiatry;  Laterality: Left;  . IRRIGATION AND DEBRIDEMENT FOOT Left 08/05/2018   Procedure: IRRIGATION AND DEBRIDEMENT FOOT;  Surgeon: Sharlotte Alamo, DPM;  Location: [facility] ORS;  Service: Podiatry;  Laterality: Left;  . TOE AMPUTATION       A IV Location/Drains/Wounds Patient Lines/Drains/Airways Status   Active Line/Drains/Airways    Name:   Placement date:   Placement time:   Site:   Days:   Peripheral IV 10/07/18 Right Antecubital   10/07/18    0122     Antecubital   less than 1   PICC Single Lumen 95/09/32 PICC Left Basilic 45 cm 0 cm   67/12/45    8099    Basilic   63   Urethral Catheter Ruben Baranowski RN Latex   10/07/18    0428    Latex   less than 1   Incision (Closed) 08/03/18 Foot Left   08/03/18    1321     65   Incision (Closed) 08/05/18 Foot Left   08/05/18    1249     63   Wound / Incision (Open or Dehisced) 08/02/18 Other (Comment) Foot Left   08/02/18    1537    Foot   66          Intake/Output Last 24 hours  Intake/Output Summary (Last 24 hours) at 10/07/2018 0500 Last data filed at 10/07/2018 0429 Gross per 24 hour  Intake -  Output 800 ml  Net -800 ml    Labs/Imaging Results for orders placed or performed during the hospital encounter of 10/07/18 (from the past 48 hour(s))  CBC     Status: Abnormal   Collection Time: 10/06/18  7:46 PM  Result Value Ref Range   WBC 20.6 (H) 4.0 - 10.5 K/uL   RBC 4.35 4.22 - 5.81 MIL/uL   Hemoglobin 12.9 (L) 13.0 - 17.0 g/dL   HCT 39.6 39.0 - 52.0 %   MCV 91.0 80.0 - 100.0 fL   MCH 29.7 26.0 - 34.0 pg  MCHC 32.6 30.0 - 36.0 g/dL   RDW 13.9 11.5 - 15.5 %   Platelets 318 150 - 400 K/uL   nRBC 0.0 0.0 - 0.2 %    Comment: Performed at [facility], [address] [place], [place] [address]  Basic metabolic panel     Status: Abnormal   Collection Time: 10/06/18  7:46 PM  Result Value Ref Range   Sodium 134 (L) 135 - 145 mmol/L   Potassium 4.8 3.5 - 5.1 mmol/L   Chloride 101 98 - 111 mmol/L   CO2 23 22 - 32 mmol/L   Glucose, Bld 105 (H) 70 - 99 mg/dL   BUN 52 (H) 8 - 23 mg/dL   Creatinine, Ser 2.63 (H) 0.61 - 1.24 mg/dL   Calcium 12.4 (H) 8.9 - 10.3 mg/dL   GFR calc non Af Amer 25 (L) >60 mL/min   GFR calc Af Amer 29 (L) >60 mL/min   Anion gap 10 5 - 15    Comment: Performed at [facility], [address] [place], [place] [address]  Procalcitonin     Status: None   Collection Time: 10/06/18  7:48 PM  Result Value Ref Range   Procalcitonin 0.32 ng/mL     Comment:        Interpretation: PCT (Procalcitonin) <= 0.5 ng/mL: Systemic infection (sepsis) is not likely. Local bacterial infection is possible. (NOTE)       Sepsis PCT Algorithm           Lower Respiratory Tract                                      Infection PCT Algorithm    ----------------------------     ----------------------------         PCT < 0.25 ng/mL                PCT < 0.10 ng/mL         Strongly encourage             Strongly discourage   discontinuation of antibiotics    initiation of antibiotics    ----------------------------     -----------------------------       PCT 0.25 - 0.50 ng/mL            PCT 0.10 - 0.25 ng/mL               OR       >80% decrease in PCT            Discourage initiation of                                            antibiotics      Encourage discontinuation           of antibiotics    ----------------------------     -----------------------------         PCT >= 0.50 ng/mL              PCT 0.26 - 0.50 ng/mL               AND        <80% decrease in PCT             Encourage initiation of  antibiotics       Encourage continuation           of antibiotics    ----------------------------     -----------------------------        PCT >= 0.50 ng/mL                  PCT > 0.50 ng/mL               AND         increase in PCT                  Strongly encourage                                      initiation of antibiotics    Strongly encourage escalation           of antibiotics                                     -----------------------------                                           PCT <= 0.25 ng/mL                                                 OR                                        > 80% decrease in PCT                                     Discontinue / Do not initiate                                             antibiotics Performed at [facility], [address]  [place], [place] [address]   Lactic acid, plasma     Status: None   Collection Time: 10/06/18  8:02 PM  Result Value Ref Range   Lactic Acid, Venous 1.9 0.5 - 1.9 mmol/L    Comment: Performed at [facility], [address] [place], [place] [address]  Urinalysis, Complete w Microscopic     Status: Abnormal   Collection Time: 10/07/18  1:00 AM  Result Value Ref Range   Color, Urine YELLOW (A) YELLOW   APPearance TURBID (A) CLEAR   Specific Gravity, Urine 1.010 1.005 - 1.030   pH 6.0 5.0 - 8.0   Glucose, UA NEGATIVE NEGATIVE mg/dL   Hgb urine dipstick MODERATE (A) NEGATIVE   Bilirubin Urine NEGATIVE NEGATIVE   Ketones, ur NEGATIVE NEGATIVE mg/dL   Protein, ur 100 (A) NEGATIVE mg/dL   Nitrite NEGATIVE NEGATIVE   Leukocytes,Ua MODERATE (A) NEGATIVE   RBC / HPF 21-50 0 - 5 RBC/hpf  WBC, UA >50 (H) 0 - 5 WBC/hpf   Bacteria, UA NONE SEEN NONE SEEN   Squamous Epithelial / LPF 11-20 0 - 5   WBC Clumps PRESENT    Amorphous Crystal PRESENT     Comment: Performed at [facility], [address] [place], [place] [address]  Lactic acid, plasma     Status: None   Collection Time: 10/07/18  1:00 AM  Result Value Ref Range   Lactic Acid, Venous 1.7 0.5 - 1.9 mmol/L    Comment: Performed at [facility], [address] [place], [place] [address]  SARS Coronavirus 2 (CEPHEID - Performed in [place] hospital lab), Hosp Order     Status: None   Collection Time: 10/07/18  1:00 AM   Specimen: Nasopharyngeal Swab  Result Value Ref Range   SARS Coronavirus 2 NEGATIVE NEGATIVE    Comment: (NOTE) If result is NEGATIVE SARS-CoV-2 target nucleic acids are NOT DETECTED. The SARS-CoV-2 RNA is generally detectable in upper and lower  respiratory specimens during the acute phase of infection. The lowest  concentration of SARS-CoV-2 viral copies this assay can detect is 250  copies / mL. A negative result does not preclude SARS-CoV-2 infection  and should not be used as the  sole basis for treatment or other  patient management decisions.  A negative result may occur with  improper specimen collection / handling, submission of specimen other  than nasopharyngeal swab, presence of viral mutation(s) within the  areas targeted by this assay, and inadequate number of viral copies  (<250 copies / mL). A negative result must be combined with clinical  observations, patient history, and epidemiological information. If result is POSITIVE SARS-CoV-2 target nucleic acids are DETECTED. The SARS-CoV-2 RNA is generally detectable in upper and lower  respiratory specimens dur ing the acute phase of infection.  Positive  results are indicative of active infection with SARS-CoV-2.  Clinical  correlation with patient history and other diagnostic information is  necessary to determine patient infection status.  Positive results do  not rule out bacterial infection or co-infection with other viruses. If result is PRESUMPTIVE POSTIVE SARS-CoV-2 nucleic acids MAY BE PRESENT.   A presumptive positive result was obtained on the submitted specimen  and confirmed on repeat testing.  While 2019 novel coronavirus  (SARS-CoV-2) nucleic acids may be present in the submitted sample  additional confirmatory testing may be necessary for epidemiological  and / or clinical management purposes  to differentiate between  SARS-CoV-2 and other Sarbecovirus currently known to infect humans.  If clinically indicated additional testing with an alternate test  methodology [phone]) is advised. The SARS-CoV-2 RNA is generally  detectable in upper and lower respiratory sp ecimens during the acute  phase of infection. The expected result is Negative. Fact Sheet for Patients:  StrictlyIdeas.no Fact Sheet for Healthcare Providers: BankingDealers.co.za This test is not yet approved or cleared by the [place] FDA and has been authorized for detection  and/or diagnosis of SARS-CoV-2 by FDA under an Emergency Use Authorization (EUA).  This EUA will remain in effect (meaning this test can be used) for the duration of the COVID-19 declaration under Section 564(b)(1) of the Act, 21 U.S.C. section 360bbb-3(b)(1), unless the authorization is terminated or revoked sooner. Performed at [facility], [address] [place],  [address]    Dg Chest Port 1 View  Result Date: 10/07/2018 CLINICAL DATA:  Sepsis EXAM: PORTABLE CHEST 1 VIEW COMPARISON:  None. FINDINGS: Heart and mediastinal  contours are within normal limits. No focal opacities or effusions. No acute bony abnormality. IMPRESSION: No active disease. Electronically Signed   By: Rolm Baptise M.D.   On: 10/07/2018 01:20    Pending Labs Unresulted Labs (From admission, onward)    Start     Ordered   10/07/18 0500  Protime-INR  Tomorrow morning,   STAT     10/07/18 0442   10/07/18 0500  Cortisol-am, blood  Tomorrow morning,   STAT     10/07/18 0442   10/07/18 0500  Procalcitonin  Tomorrow morning,   STAT     10/07/18 0442   10/07/18 6578  Basic metabolic panel  Tomorrow morning,   STAT     10/07/18 0442   10/07/18 0500  CBC  Tomorrow morning,   STAT     10/07/18 0442   10/07/18 0443  Urine culture  Once,   STAT    Question:  Patient immune status  Answer:  Normal   10/07/18 0442   10/07/18 0048  Blood Culture (routine x 2)  BLOOD CULTURE X 2,   STAT     10/07/18 0048   10/07/18 0045  Lactic acid, plasma  STAT Now then every 3 hours,   STAT     10/07/18 0048   10/06/18 2259  Urine Culture  Add-on,   AD    Question:  Patient immune status  Answer:  Normal   10/06/18 2258          Vitals/Pain Today's Vitals   10/07/18 0300 10/07/18 0320 10/07/18 0340 10/07/18 0400  BP: 91/61 (!) 84/49 (!) 98/51 100/72  Pulse: (!) 53 (!) 53 76   Resp: 18 20 16 19   Temp:      TempSrc:      SpO2: 100% 97% 100%   Weight:      Height:      PainSc:        Isolation  Precautions No active isolations  Medications Medications  sodium chloride 0.9 % bolus 1,000 mL (1,000 mLs Intravenous New Bag/Given 10/07/18 0330)    And  sodium chloride 0.9 % bolus 1,000 mL (0 mLs Intravenous Stopped 10/07/18 0330)    And  sodium chloride 0.9 % bolus 250 mL (has no administration in time range)  cefTRIAXone (ROCEPHIN) 1 g in sodium chloride 0.9 % 100 mL IVPB (0 g Intravenous Stopped 10/07/18 0449)  apixaban (ELIQUIS) tablet 5 mg (has no administration in time range)  finasteride (PROSCAR) tablet 5 mg (has no administration in time range)  furosemide (LASIX) tablet 20 mg (has no administration in time range)  LORazepam (ATIVAN) tablet 0.25 mg (has no administration in time range)  metoprolol succinate (TOPROL-XL) 24 hr tablet 25 mg (has no administration in time range)  aspirin EC tablet 81 mg (has no administration in time range)  digoxin (LANOXIN) tablet 0.25 mg (has no administration in time range)  docusate sodium (COLACE) capsule 100 mg (has no administration in time range)  gabapentin (NEURONTIN) capsule 300 mg (has no administration in time range)  lisinopril (ZESTRIL) tablet 2.5 mg (has no administration in time range)  sodium chloride flush (NS) 0.9 % injection 3 mL (has no administration in time range)  0.9 %  sodium chloride infusion (has no administration in time range)  acetaminophen (TYLENOL) tablet 650 mg (has no administration in time range)    Or  acetaminophen (TYLENOL) suppository 650 mg (has no administration in time range)  HYDROcodone-acetaminophen (NORCO/VICODIN) 5-325 MG per tablet 1-2 tablet (  has no administration in time range)  polyethylene glycol (MIRALAX / GLYCOLAX) packet 17 g (has no administration in time range)  ondansetron (ZOFRAN) tablet 4 mg (has no administration in time range)    Or  ondansetron (ZOFRAN) injection 4 mg (has no administration in time range)    Mobility walks with device High fall risk   Focused  Assessments n/a   R Recommendations: See Admitting Provider Note  Report given to:   Additional Notes: n/a

## 2018-10-07 NOTE — H&P (Signed)
[place] [facility] [place] NAME: Ruben Clark    MR#:  [id number]  DATE OF BIRTH:  1955-11-03  DATE OF ADMISSION:  10/07/2018  PRIMARY CARE PHYSICIAN: System, Pcp Not In   REQUESTING/REFERRING PHYSICIAN: Hinda Kehr, MD  CHIEF COMPLAINT:   Chief Complaint  Patient presents with  . Urinary Tract Infection    HISTORY OF PRESENT ILLNESS:  Ruben Clark  is a 63 y.o. male with a known history of CHF and hypertension.  He presented from the group home with a report of recent frequent falls.  Patient reports he has been weak with poor appetite over the last week resulting in frequent falls.  Patient is complaining of being unable to void with lower abdominal pain.  He has noted no hematuria.  No fevers, chills, nausea, vomiting, diarrhea.  He denies chest pain or shortness of breath.  Urinalysis demonstrates moderate leukocytes with greater than 50 WBCs with turbid urine which appears to contain puslike drainage.  He was started on IV antibiotic with Rocephin in the emergency room.  This has been continued.  Urine culture is pending.  Patient also noted to be hypotensive in the emergency room with blood pressure the 8/57.  Leukocytosis with WBC 20.6 and lactic acid 1.7.  Additionally, there is evidence of acute kidney injury with BUN of 52 and creatinine 2.63.  He has been admitted to the hospital service for sepsis with urinary tract infection.  PAST MEDICAL HISTORY:   Past Medical History:  Diagnosis Date  . CHF (congestive heart failure) ([place])   . Hypertension     PAST SURGICAL HISTORY:   Past Surgical History:  Procedure Laterality Date  . AMPUTATION Left 08/03/2018   Procedure: AMPUTATION RAY SECOND TOE;  Surgeon: Sharlotte Alamo, DPM;  Location: [facility] ORS;  Service: Podiatry;  Laterality: Left;  . IRRIGATION AND DEBRIDEMENT FOOT Left 08/05/2018   Procedure: IRRIGATION AND DEBRIDEMENT FOOT;  Surgeon: Sharlotte Alamo, DPM;  Location: [facility] ORS;  Service:  Podiatry;  Laterality: Left;  . TOE AMPUTATION      SOCIAL HISTORY:   Social History   Tobacco Use  . Smoking status: Current Every Day Smoker  . Smokeless tobacco: Never Used  Substance Use Topics  . Alcohol use: Never    Frequency: Never    FAMILY HISTORY:  No family history on file.  DRUG ALLERGIES:  No Known Allergies  REVIEW OF SYSTEMS:   Review of Systems  Constitutional: Negative for chills and fever.  HENT: Negative for congestion, sinus pain and sore throat.   Eyes: Negative for blurred vision and double vision.  Respiratory: Negative for cough, shortness of breath and wheezing.   Cardiovascular: Negative for chest pain and palpitations.  Gastrointestinal: Positive for abdominal pain and nausea. Negative for blood in stool, constipation, diarrhea, heartburn, melena and vomiting.  Genitourinary: Positive for dysuria.       Unable to void  Musculoskeletal: Positive for falls. Negative for joint pain, myalgias and neck pain.  Skin: Negative for itching and rash.  Neurological: Negative for dizziness, focal weakness, loss of consciousness, weakness and headaches.  Psychiatric/Behavioral: Negative.  Negative for depression.     MEDICATIONS AT HOME:   Prior to Admission medications   Medication Sig Start Date End Date Taking? Authorizing Provider  apixaban (ELIQUIS) 5 MG TABS tablet Take 1 tablet (5 mg total) by mouth 2 (two) times daily. 08/09/18   Dustin Flock, MD  aspirin EC 81 MG tablet Take 81  mg by mouth daily.    [provider]  digoxin (LANOXIN) 0.25 MG tablet Take 0.25 mg by mouth daily.    [provider]  docusate sodium (COLACE) 100 MG capsule Take 100 mg by mouth 2 (two) times daily.    [provider]  finasteride (PROSCAR) 5 MG tablet Take 1 tablet (5 mg total) by mouth daily. 08/09/18   Dustin Flock, MD  furosemide (LASIX) 40 MG tablet Take 0.5 tablets (20 mg total) by mouth daily. 08/09/18   Dustin Flock, MD   gabapentin (NEURONTIN) 300 MG capsule Take 300 mg by mouth at bedtime.    [provider]  HYDROcodone-acetaminophen (NORCO) 7.5-325 MG tablet Take 1 tablet by mouth every 6 (six) hours as needed for moderate pain. 08/09/18   Dustin Flock, MD  hydrOXYzine (ATARAX/VISTARIL) 25 MG tablet Take 25 mg by mouth every 8 (eight) hours as needed.    [provider]  lisinopril (ZESTRIL) 2.5 MG tablet Take 2.5 mg by mouth daily.    [provider]  LORazepam (ATIVAN) 0.5 MG tablet Take 0.5 tablets (0.25 mg total) by mouth daily. 08/09/18   Dustin Flock, MD  metoprolol succinate (TOPROL-XL) 25 MG 24 hr tablet Take 1 tablet (25 mg total) by mouth daily. 08/09/18   Dustin Flock, MD  nicotine (NICODERM CQ - DOSED IN MG/24 HOURS) 14 mg/24hr patch Place 1 patch (14 mg total) onto the skin daily. 08/09/18   Dustin Flock, MD      VITAL SIGNS:  Blood pressure 100/72, pulse 76, temperature 99.4 F (37.4 C), temperature source Oral, resp. rate 19, height 5\' 8"  (1.727 m), weight 72.6 kg, SpO2 100 %.  PHYSICAL EXAMINATION:  Physical Exam  GENERAL:  63 y.o.-year-old patient lying in the bed with no acute distress.  EYES: Pupils equal, round, reactive to light and accommodation. No scleral icterus. Extraocular muscles intact.  HEENT: Head atraumatic, normocephalic. Oropharynx and nasopharynx clear.  NECK:  Supple, no jugular venous distention. No thyroid enlargement, no tenderness.  LUNGS: Normal breath sounds bilaterally, no wheezing, rales,rhonchi or crepitation. No use of accessory muscles of respiration.  CARDIOVASCULAR: Regular rate and rhythm, S1, S2 normal. No murmurs, rubs, or gallops.  ABDOMEN: Lower abdominal tenderness and firmness to palpation. Bowel sounds present. No organomegaly or mass.  EXTREMITIES: No pedal edema, cyanosis, or clubbing.  Healed amputation site of first and second toes on his left foot. NEUROLOGIC: Cranial nerves II through XII are intact. Muscle  strength 5/5 in all extremities. Sensation intact. Gait not checked.  PSYCHIATRIC: The patient is alert and oriented x 3.  Normal affect and good eye contact. SKIN: No obvious rash, lesion, or ulcer.   LABORATORY PANEL:   CBC Recent Labs  Lab 10/06/18 1946  WBC 20.6*  HGB 12.9*  HCT 39.6  PLT 318   ------------------------------------------------------------------------------------------------------------------  Chemistries  Recent Labs  Lab 10/06/18 1946  NA 134*  K 4.8  CL 101  CO2 23  GLUCOSE 105*  BUN 52*  CREATININE 2.63*  CALCIUM 12.4*   ------------------------------------------------------------------------------------------------------------------  Cardiac Enzymes No results for input(s): TROPONINI in the last 168 hours. ------------------------------------------------------------------------------------------------------------------  RADIOLOGY:  Dg Chest Port 1 View  Result Date: 10/07/2018 CLINICAL DATA:  Sepsis EXAM: PORTABLE CHEST 1 VIEW COMPARISON:  None. FINDINGS: Heart and mediastinal contours are within normal limits. No focal opacities or effusions. No acute bony abnormality. IMPRESSION: No active disease. Electronically Signed   By: Rolm Baptise M.D.   On: 10/07/2018 01:20  IMPRESSION AND PLAN:   1. sepsis - Patient received 1 L IV fluid bolus upon arrival to the emergency room as well as initiation of IV antibiotic therapy with Rocephin -Patient continues to receive normal saline at 125 cc/h. - IV antibiotic continued with IV Rocephin - Urine culture is pending - Repeat CBC and BMP in the a.m. -Lactic acid will be repeated per protocol - Patient is on telemetry monitoring  2.  Urinary tract infection - Started on IV Rocephin -Urine culture is pending -Will adjust treatment based on culture results when available  3.  Acute kidney injury - Likely secondary to severe urinary tract infection -We will continue to follow renal function  closely with repeated BMP in the a.m. -We will consider renal ultrasound if no improvement or if worse. - Patient is undergoing volume replacement with normal saline at 125 cc/h  4.  Leukocytosis --Urine and blood cultures are pending - Secondary to urinary tract infection - Will monitor for improvement coinciding with IV antibiotic therapy  DVT and PPI prophylaxis initiated   All the records are reviewed and case discussed with ED provider. The plan of care was discussed in details with the patient (and family). I answered all questions. The patient agreed to proceed with the above mentioned plan. Further management will depend upon hospital course.   CODE STATUS: Full code  TOTAL TIME TAKING CARE OF THIS PATIENT: 45 minutes.    Theo Dills Ladarrius Bogdanski CRNPon 10/07/2018 at 4:38 AM  Pager - [phone]  After 6pm go to [email or web address] - Proofreader  [facility]  Office  [phone]  CC: Primary care physician; System, Pcp Not In   Note: This dictation was prepared with Dragon dictation along with smaller phrase technology. Any transcriptional errors that result from this process are unintentional.

## 2018-10-07 NOTE — ED Notes (Signed)
Pt abdomen over bladder is tight and painful. In and out cath done and only a small amount of urine present which was purulent white. When cath removed, large amount of purulent urine projectile out and penis continues to drain urine slowly.

## 2018-10-07 NOTE — ED Notes (Signed)
Pt resting quietly. Bed adjusted for comfort.

## 2018-10-07 NOTE — Progress Notes (Signed)
BP remains low after bolus. 90/52. Order received from Dr Estanislado Pandy to increase NS to 122ml/hr

## 2018-10-08 LAB — CBC
HCT: 38.3 % — ABNORMAL LOW (ref 39.0–52.0)
Hemoglobin: 12.1 g/dL — ABNORMAL LOW (ref 13.0–17.0)
MCH: 29.7 pg (ref 26.0–34.0)
MCHC: 31.6 g/dL (ref 30.0–36.0)
MCV: 94.1 fL (ref 80.0–100.0)
Platelets: 287 10*3/uL (ref 150–400)
RBC: 4.07 MIL/uL — ABNORMAL LOW (ref 4.22–5.81)
RDW: 14.4 % (ref 11.5–15.5)
WBC: 16.4 10*3/uL — ABNORMAL HIGH (ref 4.0–10.5)
nRBC: 0 % (ref 0.0–0.2)

## 2018-10-08 LAB — BASIC METABOLIC PANEL
Anion gap: 10 (ref 5–15)
BUN: 35 mg/dL — ABNORMAL HIGH (ref 8–23)
CO2: 17 mmol/L — ABNORMAL LOW (ref 22–32)
Calcium: 11.4 mg/dL — ABNORMAL HIGH (ref 8.9–10.3)
Chloride: 111 mmol/L (ref 98–111)
Creatinine, Ser: 1.69 mg/dL — ABNORMAL HIGH (ref 0.61–1.24)
GFR calc Af Amer: 49 mL/min — ABNORMAL LOW (ref >60)
GFR calc non Af Amer: 43 mL/min — ABNORMAL LOW (ref >60)
Glucose, Bld: 97 mg/dL (ref 70–99)
Potassium: 4 mmol/L (ref 3.5–5.1)
Sodium: 138 mmol/L (ref 135–145)

## 2018-10-08 MED ORDER — LOPERAMIDE HCL 2 MG PO CAPS: 2.0000 mg | ORAL_CAPSULE | ORAL | Status: DC | PRN

## 2018-10-08 NOTE — NC FL2 (Signed)
[place] LEVEL OF CARE SCREENING TOOL     IDENTIFICATION  Patient Name: Ruben Clark Birthdate: [birth year] Sex: male Admission Date (Current Location): 10/07/2018  [place] and [place] Number:  Engineering geologist and Address:  [facility], [address], [place], [place] [address]      Provider Number: [id number]  Attending Physician Name and Address:  Saundra Shelling, MD  Relative Name and Phone Number:  Legal Wilhemina Bonito 1 [phone]    Current Level of Care:   Recommended Level of Care: [place] Prior Approval Number:    Date Approved/Denied:   PASRR Number: [id number]  Discharge Plan: (Back to Assisted Living)    Current Diagnoses: Patient Active Problem List   Diagnosis Date Noted  . Sepsis ([place]) 10/07/2018  . COPD (chronic obstructive pulmonary disease) ([place]) 08/26/2018  . Atrial fibrillation ([place]) 08/26/2018  . Left foot infection 08/02/2018    Orientation RESPIRATION BLADDER Height & Weight     Self, Place, Time  Normal Indwelling catheter Weight: 72.6 kg Height:  5\' 8"  (172.7 cm)  BEHAVIORAL SYMPTOMS/MOOD NEUROLOGICAL BOWEL NUTRITION STATUS    (NA) Continent Diet  AMBULATORY STATUS COMMUNICATION OF NEEDS Skin   Supervision Verbally Normal                       Personal Care Assistance Level of Assistance              Functional Limitations Info             SPECIAL CARE FACTORS FREQUENCY                       Contractures Contractures Info: Not present    Additional Factors Info  Code Status Code Status Info: Full             Current Medications (10/08/2018):  This is the current hospital active medication list Current Facility-Administered Medications  Medication Dose Route Frequency Provider Last Rate Last Dose  . 0.9 %  sodium chloride infusion   Intravenous Continuous Saundra Shelling, MD   Stopped at 10/08/18 0046  . acetaminophen (TYLENOL) tablet 650  mg  650 mg Oral Q6H PRN Seals, Theo Dills, NP       Or  . acetaminophen (TYLENOL) suppository 650 mg  650 mg Rectal Q6H PRN Seals, Theo Dills, NP      . apixaban (ELIQUIS) tablet 5 mg  5 mg Oral BID Gardiner Barefoot H, NP   5 mg at 10/08/18 0914  . aspirin EC tablet 81 mg  81 mg Oral Daily Mayer Camel, NP   81 mg at 10/08/18 0913  . cefTRIAXone (ROCEPHIN) 1 g in sodium chloride 0.9 % 100 mL IVPB  1 g Intravenous Q24H Hinda Kehr, MD 200 mL/hr at 10/08/18 0210 1 g at 10/08/18 0210  . Chlorhexidine Gluconate Cloth 2 % PADS 6 each  6 each Topical Daily Saundra Shelling, MD   6 each at 10/08/18 1655  . digoxin (LANOXIN) tablet 0.25 mg  0.25 mg Oral Daily Seals, Angela H, NP   0.25 mg at 10/08/18 9150  . docusate sodium (COLACE) capsule 100 mg  100 mg Oral BID Gardiner Barefoot H, NP   100 mg at 10/08/18 0913  . finasteride (PROSCAR) tablet 5 mg  5 mg Oral Daily Seals, Angela H, NP   5 mg at 10/08/18 0913  . furosemide (LASIX) tablet 20 mg  20 mg  Oral Daily Seals, Theo Dills, NP   20 mg at 10/08/18 1165  . gabapentin (NEURONTIN) capsule 300 mg  300 mg Oral QHS Seals, Angela H, NP   300 mg at 10/07/18 2321  . HYDROcodone-acetaminophen (NORCO/VICODIN) 5-325 MG per tablet 1-2 tablet  1-2 tablet Oral Q4H PRN Seals, Levada Dy H, NP      . loperamide (IMODIUM) capsule 2 mg  2 mg Oral PRN Pyreddy, Reatha Harps, MD      . LORazepam (ATIVAN) tablet 0.25 mg  0.25 mg Oral Daily Seals, Angela H, NP   0.25 mg at 10/08/18 0914  . metoprolol succinate (TOPROL-XL) 24 hr tablet 25 mg  25 mg Oral Daily Seals, Angela H, NP   25 mg at 10/08/18 0914  . mupirocin ointment (BACTROBAN) 2 % 1 application  1 application Nasal BID Saundra Shelling, MD   1 application at 79/03/83 0921  . ondansetron (ZOFRAN) tablet 4 mg  4 mg Oral Q6H PRN Seals, Theo Dills, NP       Or  . ondansetron (ZOFRAN) injection 4 mg  4 mg Intravenous Q6H PRN Seals, Levada Dy H, NP      . polyethylene glycol (MIRALAX / GLYCOLAX) packet 17 g  17 g Oral Daily PRN Seals, Levada Dy H, NP       . sodium chloride flush (NS) 0.9 % injection 3 mL  3 mL Intravenous Q12H Seals, Theo Dills, NP   3 mL at 10/07/18 2321     Discharge Medications: Please see discharge summary for a list of discharge medications.  Relevant Imaging Results:  Relevant Lab Results:   Additional Information Foley is for urinary retention. not sure at present if will discharge with it  Katrina Stack, RN

## 2018-10-08 NOTE — TOC Initial Note (Addendum)
Transition of Care [facility]) - Initial/Assessment Note    Patient Details  Name: Ruben Clark MRN: [id number] Date of Birth: March 23, 1956  [facility] CM/SW Contact:    Katrina Stack, RN Phone Number: 10/08/2018, 6:12 PM  Clinical Narrative:              Patient is from an assisted level level of care: [facility]. CM has not been able to make contact with the facility. The phone number [phone])  seems to go to "fax."  Patient has a legal guardian- [name]. CM has left a voicemail message. No answer to the guardian mobile number and no option to leave a voicemail message. Completed FL2 and sent for signature.  Patient currently with foley cath for acute urinary retention. During progression discussed the need to maintain patient's mobility to prevent decline in ambulation status.     later note- Was able to speak with Truddie Coco the facility director.  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 Currently receiving home health with [facility]. These services will need to be resumed. [name] will arrange for transportation back to facility at discharge.  She  say that her facility would not be able to manage a long term foley catheter.     Patient Goals and CMS Choice        Expected Discharge Plan and Services         Living arrangements for the past 2 months: [place]                                      Prior Living Arrangements/Services Living arrangements for the past 2 months: [place]   Patient language and need for interpreter reviewed:: Yes Do you feel safe going back to the place where you live?: Yes      Need for Family Participation in Patient Care: No (Comment) Care giver support system in place?: Yes (comment)   Criminal Activity/Legal Involvement Pertinent to Current Situation/Hospitalization: No - Comment as needed  Activities  of Daily Living Home Assistive Devices/Equipment: Gilford Rile (specify type) ADL Screening (condition at time of admission) Patient's cognitive ability adequate to safely complete daily activities?: Yes Is the patient deaf or have difficulty hearing?: No Does the patient have difficulty seeing, even when wearing glasses/contacts?: No Does the patient have difficulty concentrating, remembering, or making decisions?: No Patient able to express need for assistance with ADLs?: Yes Does the patient have difficulty dressing or bathing?: No Independently performs ADLs?: Yes (appropriate for developmental age) Does the patient have difficulty walking or climbing stairs?: No Weakness of Legs: None Weakness of Arms/Hands: None  Permission Sought/Granted Permission sought to share information with : Other (comment)(Patient has Legal guardian)                Emotional Assessment Appearance:: Appears older than stated age Attitude/Demeanor/Rapport: Engaged   Orientation: : Oriented to Self, Oriented to Place, Oriented to  Time Alcohol / Substance Use: Not Applicable Psych Involvement: No (comment)  Admission diagnosis:  Urinary tract infection without hematuria, site unspecified [N39.0] Acute renal failure, unspecified acute renal failure type ([place]) [N17.9] Sepsis with acute renal failure without septic shock, due to unspecified organism, unspecified acute renal failure type ([place]) [A41.9, R65.20, N17.9] Patient Active Problem List   Diagnosis Date  Noted  . Sepsis ([place]) 10/07/2018  . COPD (chronic obstructive pulmonary disease) ([place]) 08/26/2018  . Atrial fibrillation ([place]) 08/26/2018  . Left foot infection 08/02/2018   PCP:  System, Pcp Not In Pharmacy:  No Pharmacies Listed    Social Determinants of Health (SDOH) Interventions    Readmission Risk Interventions No flowsheet data found.

## 2018-10-08 NOTE — Progress Notes (Signed)
Ruben Clark NAME: Ruben Clark    MR#:  [id number]  DATE OF BIRTH:  05/14/1955  SUBJECTIVE:  CHIEF COMPLAINT:   Chief Complaint  Patient presents with  . Urinary Tract Infection  Patient seen and evaluated today Low blood pressure yesterday and received IV fluids Had some diarrhea this morning No nausea No chest pain  REVIEW OF SYSTEMS:    ROS  CONSTITUTIONAL: No documented fever. Has fatigue, weakness. No weight gain, no weight loss.  EYES: No blurry or double vision.  ENT: No tinnitus. No postnasal drip. No redness of the oropharynx.  RESPIRATORY: No cough, no wheeze, no hemoptysis. No dyspnea.  CARDIOVASCULAR: No chest pain. No orthopnea. No palpitations. No syncope.  GASTROINTESTINAL: No nausea, no vomiting , has diarrhea. No abdominal pain. No melena or hematochezia.  GENITOURINARY: No dysuria or hematuria.  ENDOCRINE: No polyuria or nocturia. No heat or cold intolerance.  HEMATOLOGY: No anemia. No bruising. No bleeding.  INTEGUMENTARY: No rashes. No lesions.  MUSCULOSKELETAL: No arthritis. No swelling. No gout.  NEUROLOGIC: No numbness, tingling, or ataxia. No seizure-type activity.  PSYCHIATRIC: No anxiety. No insomnia. No ADD.   DRUG ALLERGIES:  No Known Allergies  VITALS:  Blood pressure (!) 138/59, pulse 72, temperature 97.7 F (36.5 C), temperature source Oral, resp. rate 16, height 5\' 8"  (1.727 m), weight 72.6 kg, SpO2 95 %.  PHYSICAL EXAMINATION:   Physical Exam  GENERAL:  63 y.o.-year-old patient lying in the bed with no acute distress.  EYES: Pupils equal, round, reactive to light and accommodation. No scleral icterus. Extraocular muscles intact.  HEENT: Head atraumatic, normocephalic. Oropharynx and nasopharynx clear.  NECK:  Supple, no jugular venous distention. No thyroid enlargement, no tenderness.  LUNGS: Normal breath sounds bilaterally, no wheezing, rales, rhonchi. No use of accessory muscles of  respiration.  CARDIOVASCULAR: S1, S2 normal. No murmurs, rubs, or gallops.  ABDOMEN: Soft, nontender, nondistended. Bowel sounds present. No organomegaly or mass.  EXTREMITIES: No cyanosis, clubbing or edema b/l.    NEUROLOGIC: Cranial nerves II through XII are intact. No focal Motor or sensory deficits b/l.   PSYCHIATRIC: The patient is alert and oriented x 3.  SKIN: No obvious rash, lesion, or ulcer.   LABORATORY PANEL:   CBC Recent Labs  Lab 10/08/18 0949  WBC 16.4*  HGB 12.1*  HCT 38.3*  PLT 287   ------------------------------------------------------------------------------------------------------------------ Chemistries  Recent Labs  Lab 10/08/18 0949  NA 138  K 4.0  CL 111  CO2 17*  GLUCOSE 97  BUN 35*  CREATININE 1.69*  CALCIUM 11.4*   ------------------------------------------------------------------------------------------------------------------  Cardiac Enzymes No results for input(s): TROPONINI in the last 168 hours. ------------------------------------------------------------------------------------------------------------------  RADIOLOGY:  Dg Chest Port 1 View  Result Date: 10/07/2018 CLINICAL DATA:  Sepsis EXAM: PORTABLE CHEST 1 VIEW COMPARISON:  None. FINDINGS: Heart and mediastinal contours are within normal limits. No focal opacities or effusions. No acute bony abnormality. IMPRESSION: No active disease. Electronically Signed   By: Rolm Baptise M.D.   On: 10/07/2018 01:20     ASSESSMENT AND PLAN:   63 year old male patient with history of congestive heart failure, hypertension currently under hospitalist service for UTI and infection  -Sepsis improving Secondary to UTI On IV fluids and antibiotics Follow-up cultures and lactic acid levels  -Acute UTI Improving Currently on IV Rocephin antibiotic Urine culture staph aureus pending sensitivities  -Leukocytosis improving Continue antibiotics  -Diarrhea Appears self-limiting  -Acute  kidney injury improving Avoid nephrotoxic medication  Continue IV fluids  -Hypotension improved  -Ambulatory dysfunction Physical therapy evaluation  All the records are reviewed and case discussed with Care Management/Social Worker. Management plans discussed with the patient, family and they are in agreement.  CODE STATUS: Full code  DVT Prophylaxis: SCDs  TOTAL TIME TAKING CARE OF THIS PATIENT: 37 minutes.   POSSIBLE D/C IN 2 to 3 DAYS, DEPENDING ON CLINICAL CONDITION.  Saundra Shelling M.D on 10/08/2018 at 12:30 PM  Between 7am to 6pm - Pager - [phone]  After 6pm go to [email or web address] - password [email or web address] [facility]  Office  [phone]  CC: Primary care physician; System, Pcp Not In  Note: This dictation was prepared with Dragon dictation along with smaller phrase technology. Any transcriptional errors that result from this process are unintentional.

## 2018-10-08 NOTE — Progress Notes (Signed)
Purulent drainage and ulcer like sores noted at meatus and penis and scrotum. Also no UOP all day.  Order to irrigate foley and keep peri area dry

## 2018-10-09 DIAGNOSIS — R001 Bradycardia, unspecified: Secondary | ICD-10-CM

## 2018-10-09 LAB — CBC
HCT: 30.8 % — ABNORMAL LOW (ref 39.0–52.0)
Hemoglobin: 10 g/dL — ABNORMAL LOW (ref 13.0–17.0)
MCH: 29.9 pg (ref 26.0–34.0)
MCHC: 32.5 g/dL (ref 30.0–36.0)
MCV: 91.9 fL (ref 80.0–100.0)
Platelets: 245 10*3/uL (ref 150–400)
RBC: 3.35 MIL/uL — ABNORMAL LOW (ref 4.22–5.81)
RDW: 14.2 % (ref 11.5–15.5)
WBC: 14.4 10*3/uL — ABNORMAL HIGH (ref 4.0–10.5)
nRBC: 0 % (ref 0.0–0.2)

## 2018-10-09 LAB — BASIC METABOLIC PANEL
Anion gap: 6 (ref 5–15)
BUN: 32 mg/dL — ABNORMAL HIGH (ref 8–23)
CO2: 22 mmol/L (ref 22–32)
Calcium: 10.8 mg/dL — ABNORMAL HIGH (ref 8.9–10.3)
Chloride: 111 mmol/L (ref 98–111)
Creatinine, Ser: 1.56 mg/dL — ABNORMAL HIGH (ref 0.61–1.24)
GFR calc Af Amer: 54 mL/min — ABNORMAL LOW (ref >60)
GFR calc non Af Amer: 47 mL/min — ABNORMAL LOW (ref >60)
Glucose, Bld: 100 mg/dL — ABNORMAL HIGH (ref 70–99)
Potassium: 3.8 mmol/L (ref 3.5–5.1)
Sodium: 139 mmol/L (ref 135–145)

## 2018-10-09 LAB — URINE CULTURE
Culture: >=100000 — AB
Special Requests: NORMAL

## 2018-10-09 MED ORDER — DOXYCYCLINE HYCLATE 100 MG PO TABS
100.0000 mg | ORAL_TABLET | Freq: Two times a day (BID) | ORAL | Status: DC
  Administered 2018-10-09 – 2018-10-11 (×5): 100 mg via ORAL
  Filled 2018-10-09 (×5): qty 1

## 2018-10-09 NOTE — Progress Notes (Addendum)
[place] [facility] [place] NAME: Franciscojavier Wronski    MR#:  [id number]  DATE OF BIRTH:  09/02/1955  SUBJECTIVE:  CHIEF COMPLAINT:   Chief Complaint  Patient presents with  . Urinary Tract Infection  Patient seen and evaluated today Blood pressure better today No nausea No chest pain No fever Has bradycardia  REVIEW OF SYSTEMS:    ROS  CONSTITUTIONAL: No documented fever. Has fatigue, weakness. No weight gain, no weight loss.  EYES: No blurry or double vision.  ENT: No tinnitus. No postnasal drip. No redness of the oropharynx.  RESPIRATORY: No cough, no wheeze, no hemoptysis. No dyspnea.  CARDIOVASCULAR: No chest pain. No orthopnea. No palpitations. No syncope.  GASTROINTESTINAL: No nausea, no vomiting , has mild diarrhea. No abdominal pain. No melena or hematochezia.  GENITOURINARY: No dysuria or hematuria.  ENDOCRINE: No polyuria or nocturia. No heat or cold intolerance.  HEMATOLOGY: No anemia. No bruising. No bleeding.  INTEGUMENTARY: No rashes. No lesions.  MUSCULOSKELETAL: No arthritis. No swelling. No gout.  NEUROLOGIC: No numbness, tingling, or ataxia. No seizure-type activity.  PSYCHIATRIC: No anxiety. No insomnia. No ADD.   DRUG ALLERGIES:  No Known Allergies  VITALS:  Blood pressure 100/64, pulse (!) 48, temperature 97.8 F (36.6 C), temperature source Oral, resp. rate 16, height 5\' 8"  (1.727 m), weight 72.6 kg, SpO2 100 %.  PHYSICAL EXAMINATION:   Physical Exam  GENERAL:  63 y.o.-year-old patient lying in the bed with no acute distress.  EYES: Pupils equal, round, reactive to light and accommodation. No scleral icterus. Extraocular muscles intact.  HEENT: Head atraumatic, normocephalic. Oropharynx and nasopharynx clear.  NECK:  Supple, no jugular venous distention. No thyroid enlargement, no tenderness.  LUNGS: Normal breath sounds bilaterally, no wheezing, rales, rhonchi. No use of accessory muscles of respiration.   CARDIOVASCULAR: S1, S2 bradycardia noted. No murmurs, rubs, or gallops.  ABDOMEN: Soft, nontender, nondistended. Bowel sounds present. No organomegaly or mass.  EXTREMITIES: No cyanosis, clubbing or edema b/l.    NEUROLOGIC: Cranial nerves II through XII are intact. No focal Motor or sensory deficits b/l.   PSYCHIATRIC: The patient is alert and oriented x 3.  SKIN: No obvious rash, lesion, or ulcer.   LABORATORY PANEL:   CBC Recent Labs  Lab 10/09/18 0601  WBC 14.4*  HGB 10.0*  HCT 30.8*  PLT 245   ------------------------------------------------------------------------------------------------------------------ Chemistries  Recent Labs  Lab 10/09/18 0601  NA 139  K 3.8  CL 111  CO2 22  GLUCOSE 100*  BUN 32*  CREATININE 1.56*  CALCIUM 10.8*   ------------------------------------------------------------------------------------------------------------------  Cardiac Enzymes No results for input(s): TROPONINI in the last 168 hours. ------------------------------------------------------------------------------------------------------------------  RADIOLOGY:  No results found.   ASSESSMENT AND PLAN:   63 year old male patient with history of congestive heart failure, hypertension currently under hospitalist service for UTI and infection  -Sepsis improving Secondary to UTI Urine cultures growing staph Switch to oral doxycycline antibiotic Follow-up cultures and lactic acid levels  -Acute UTI with staph Improving Discontinue Rocephin Start oral doxycycline antibiotic  -Bradycardia Hold beta-blocker Telemetry monitoring Hold digoxin Heart rate again has improved Probably patient vagal down during having bowel movement and diarrhea We will monitor the heart rate Hold beta-blocker for now  -Leukocytosis improving Continue antibiotics  -Diarrhea Appears self-limiting PRN Imodium  -Acute kidney injury improving Avoid nephrotoxic medication Continue IV  fluids  -Hypotension improved  -Ambulatory dysfunction Physical therapy evaluation  All the records are reviewed and case discussed with Care  Management/Social Worker. Management plans discussed with the patient, family and they are in agreement.  CODE STATUS: Full code  DVT Prophylaxis: SCDs  TOTAL TIME TAKING CARE OF THIS PATIENT: 35 minutes.   POSSIBLE D/C IN 2 to 3 DAYS, DEPENDING ON CLINICAL CONDITION.  Saundra Shelling M.D on 10/09/2018 at 11:56 AM  Between 7am to 6pm - Pager - [phone]  After 6pm go to [email or web address] - password [email or web address] [facility]  Office  [phone]  CC: Primary care physician; System, Pcp Not In  Note: This dictation was prepared with Dragon dictation along with smaller phrase technology. Any transcriptional errors that result from this process are unintentional.

## 2018-10-09 NOTE — Evaluation (Signed)
Physical Therapy Evaluation Patient Details Name: Ruben Clark MRN: [id number] DOB: 24-Jan-1956 Today's Date: 10/09/2018   History of Present Illness  Ruben Clark is a 71yoM who  [facility] from his group home after several falls, noted to have cloudy urine with WBC, also in AKI, and hypotensive. 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 Pt reports difficulty with dizziness over the past 3 weeks as well as 3 falls associated with sudden leg buckling.  Clinical Impression  Pt admitted with above diagnosis. Pt currently with functional limitations due to the deficits listed below (see "PT Problem List"). Upon entry, pt in bed, awake and agreeable to participate. 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 Pt is vague about his PLOF in regard to AMB and transfers. Pt is weak appearing, requiring heavy physical assist for bed mobility, transfers, and AMB, minA for into the bed, and very slow step progression for AMB. Pt is hurried to get to [facility], an unsuccessful attempt to avoid bowel incontinence, and throws his body toward the [facility] in an impulsive and unsafe fashion that requires author to stabilize BSC to prevent tipping over with fall to floor. Functional mobility assessment demonstrates increased effort/time requirements, poor tolerance, and need for physical assistance, whereas the patient performed these at a higher level of independence PTA. Author concerned that the patient has insufficient assistance for AMB, whereas he will need close 1:1 supervision for all mobility in his current state, particularly with his falls history and dizziness. STR would offer a safe setting to regain independence and safety in basic mobility prior to return to group home. Pt  will benefit from skilled PT intervention to increase independence and safety with basic mobility in preparation for discharge to the venue listed below.       Follow Up Recommendations SNF;Supervision for mobility/OOB;Supervision - Intermittent    Equipment Recommendations  None recommended by PT    Recommendations for Other Services       Precautions / Restrictions Precautions Precautions: Fall Precaution Comments: diarrhea with incontinence Restrictions Weight Bearing Restrictions: No      Mobility  Bed Mobility Overal bed mobility: Needs Assistance Bed Mobility: Supine to Sit;Sit to Supine     Supine to sit: Min guard Sit to supine: Min assist   General bed mobility comments: heavy effort required, slow and labored; needs help getting feet back into bed  Transfers Overall transfer level: Needs assistance Equipment used: Rolling walker (2 wheeled) Transfers: Sit to/from Stand Sit to Stand: Min guard         General transfer comment: needs supervision for safety; emergent SPT to [facility] is quite dangerous and almost results in fall to floor.  Ambulation/Gait Ambulation/Gait assistance: Min guard Gait Distance (Feet): 4 Feet Assistive device: Rolling walker (2 wheeled) Gait Pattern/deviations: Step-to pattern     General Gait Details: slow, small movements, appears weak and labored. Pt declines additional AMB at this time 2/2 fatigue. Appears unsafe to perform household distances.  Stairs            Wheelchair Mobility    Modified Rankin (Stroke Patients Only)       Balance Overall balance assessment: History of Falls;Needs assistance Sitting-balance support: Single extremity supported;Feet supported Sitting balance-Leahy Scale: Good  Standing balance support: During functional activity;Bilateral upper extremity supported Standing balance-Leahy Scale: Fair                               Pertinent Vitals/Pain Pain Assessment:  No/denies pain    Home Living Family/patient expects to be discharged to:: Group home                      Prior Function Level of Independence: Independent with assistive device(s)         Comments: Patient denies need for assistance with bathing/dressing. Patient used RW prior to hospital admission. Patient has 3 meals provided at group home and does not need to clean/cook while there.     Hand Dominance        Extremity/Trunk Assessment   Upper Extremity Assessment Upper Extremity Assessment: Generalized weakness    Lower Extremity Assessment Lower Extremity Assessment: Generalized weakness    Cervical / Trunk Assessment Cervical / Trunk Assessment: Kyphotic  Communication   Communication: No difficulties  Cognition Arousal/Alertness: Awake/alert Behavior During Therapy: WFL for tasks assessed/performed;Impulsive(trying to get to [facility] in a hurry, almost falls into floor) Overall Cognitive Status: No family/caregiver present to determine baseline cognitive functioning                                        General Comments      Exercises     Assessment/Plan    PT Assessment Patient needs continued PT services  PT Problem List Decreased strength;Decreased activity tolerance;Decreased cognition;Decreased balance;Decreased mobility;Decreased coordination       PT Treatment Interventions DME instruction;Balance training;Gait training;Stair training;Functional mobility training;Patient/family education;Therapeutic activities;Therapeutic exercise;Cognitive remediation    PT Goals (Current goals can be found in the Care Plan section)  Acute Rehab PT Goals Patient Stated Goal: regain strength and cease falling phenomenon PT Goal Formulation: With patient Time For Goal Achievement: 10/23/18 Potential to Achieve Goals: Fair    Frequency Min 2X/week   Barriers to discharge Decreased caregiver support group home staff unable to provide  extensive physical assist or 1:1 for all AMB    Co-evaluation               AM-PAC PT "6 Clicks" Mobility  Outcome Measure Help needed turning from your back to your side while in a flat bed without using bedrails?: A Little Help needed moving from lying on your back to sitting on the side of a flat bed without using bedrails?: A Little Help needed moving to and from a bed to a chair (including a wheelchair)?: A Little Help needed standing up from a chair using your arms (e.g., wheelchair or bedside chair)?: A Little Help needed to walk in hospital room?: A Little Help needed climbing 3-5 steps with a railing? : A Lot 6 Click Score: 17    End of Session Equipment Utilized During Treatment: Gait belt Activity Tolerance: Patient limited by fatigue Patient left: in bed;with call bell/phone within reach;with nursing/sitter in room(pt declines OOB to chair, concerned it will be uncomfortable for him.,) Nurse Communication: Mobility status(bowel incontinence onto floor/bed/gown.) PT Visit Diagnosis: Unsteadiness on feet (R26.81);Muscle weakness (generalized) (M62.81);History of falling (Z91.81);Difficulty in walking, not elsewhere classified (R26.2)    Time: 4034-7425 PT Time Calculation (min) (ACUTE ONLY): 28 min   Charges:   PT Evaluation $PT Eval Moderate  Complexity: 1 Mod PT Treatments $Therapeutic Exercise: 8-22 mins   4:18 PM, 10/09/18 Etta Grandchild, PT, DPT Physical Therapist - [facility]  [phone] ([place])     [place] C 10/09/2018, 4:12 PM

## 2018-10-10 LAB — BASIC METABOLIC PANEL
Anion gap: 4 — ABNORMAL LOW (ref 5–15)
BUN: 22 mg/dL (ref 8–23)
CO2: 23 mmol/L (ref 22–32)
Calcium: 10.3 mg/dL (ref 8.9–10.3)
Chloride: 110 mmol/L (ref 98–111)
Creatinine, Ser: 1.24 mg/dL (ref 0.61–1.24)
GFR calc Af Amer: >60 mL/min (ref >60)
GFR calc non Af Amer: >60 mL/min (ref >60)
Glucose, Bld: 97 mg/dL (ref 70–99)
Potassium: 3.6 mmol/L (ref 3.5–5.1)
Sodium: 137 mmol/L (ref 135–145)

## 2018-10-10 LAB — CBC
HCT: 32.1 % — ABNORMAL LOW (ref 39.0–52.0)
Hemoglobin: 10.3 g/dL — ABNORMAL LOW (ref 13.0–17.0)
MCH: 29.7 pg (ref 26.0–34.0)
MCHC: 32.1 g/dL (ref 30.0–36.0)
MCV: 92.5 fL (ref 80.0–100.0)
Platelets: 244 10*3/uL (ref 150–400)
RBC: 3.47 MIL/uL — ABNORMAL LOW (ref 4.22–5.81)
RDW: 14.2 % (ref 11.5–15.5)
WBC: 12.8 10*3/uL — ABNORMAL HIGH (ref 4.0–10.5)
nRBC: 0 % (ref 0.0–0.2)

## 2018-10-10 MED ORDER — DIGOXIN 125 MCG PO TABS
0.1250 mg | ORAL_TABLET | ORAL | Status: DC
  Administered 2018-10-11: 0.125 mg via ORAL
  Filled 2018-10-10: qty 1

## 2018-10-10 MED ORDER — METOPROLOL SUCCINATE ER 25 MG PO TB24: 12.5000 mg | ORAL_TABLET | Freq: Every day | ORAL | 0 refills | Status: DC

## 2018-10-10 MED ORDER — DOXYCYCLINE HYCLATE 100 MG PO TABS: 100.0000 mg | ORAL_TABLET | Freq: Two times a day (BID) | ORAL | 0 refills | Status: AC

## 2018-10-10 NOTE — TOC Transition Note (Signed)
Transition of Care [facility]) - CM/SW Discharge Note   Patient Details  Name: Lamarco Gudiel MRN: [id number] Date of Birth: [birth year]  Transition of Care [facility]) CM/SW Contact:  Latanya Maudlin, RN Phone Number: 10/10/2018, 11:05 AM   Clinical Narrative:  Patient to be discharged per MD order. Orders in place for home health services. [facility], [name] at the group home had spoke with CM team and RN/PT through [facility] was established. Notified Melissa at [facility] of discharge. No DME needs. VM left for legal guardian. Working with [name] now to arrange proper transport.      Final next level of care: Group Home Barriers to Discharge: No Barriers Identified   Patient Goals and CMS Choice   CMS Medicare.gov Compare Post Acute Care list provided to:: Patient Choice offered to / list presented to : Patient  Discharge Placement                       Discharge Plan and Services In-house Referral: Clinical Social Work   Post Acute Care Choice: Home Health                    HH Arranged: RN, PT [facility] Agency: [place] ([facility]) Date [place]: 10/10/18 Time [place]: 1105 Representative spoke with at [place]: [place] ([place]) Interventions     Readmission Risk Interventions Readmission Risk Prevention Plan 10/10/2018  Post Dischage Appt Complete  Medication Screening Complete  Transportation Screening Complete

## 2018-10-10 NOTE — Progress Notes (Addendum)
[place] [facility] [place] NAME: Ruben Clark    MR#:  [id number]  DATE OF BIRTH:  12/30/1955  SUBJECTIVE:  CHIEF COMPLAINT:   Chief Complaint  Patient presents with  . Urinary Tract Infection  Patient seen and evaluated today Blood pressure better today No nausea No chest pain No fever Bradycardia resolved No diarrhea  REVIEW OF SYSTEMS:    ROS  CONSTITUTIONAL: No documented fever. Has fatigue, weakness. No weight gain, no weight loss.  EYES: No blurry or double vision.  ENT: No tinnitus. No postnasal drip. No redness of the oropharynx.  RESPIRATORY: No cough, no wheeze, no hemoptysis. No dyspnea.  CARDIOVASCULAR: No chest pain. No orthopnea. No palpitations. No syncope.  GASTROINTESTINAL: No nausea, no vomiting , has mild diarrhea. No abdominal pain. No melena or hematochezia.  GENITOURINARY: No dysuria or hematuria.  ENDOCRINE: No polyuria or nocturia. No heat or cold intolerance.  HEMATOLOGY: No anemia. No bruising. No bleeding.  INTEGUMENTARY: No rashes. No lesions.  MUSCULOSKELETAL: No arthritis. No swelling. No gout.  NEUROLOGIC: No numbness, tingling, or ataxia. No seizure-type activity.  PSYCHIATRIC: No anxiety. No insomnia. No ADD.   DRUG ALLERGIES:  No Known Allergies  VITALS:  Blood pressure 109/63, pulse 78, temperature 98.6 F (37 C), temperature source Oral, resp. rate 20, height 5\' 8"  (1.727 m), weight 72.6 kg, SpO2 100 %.  PHYSICAL EXAMINATION:   Physical Exam  GENERAL:  63 y.o.-year-old patient lying in the bed with no acute distress.  EYES: Pupils equal, round, reactive to light and accommodation. No scleral icterus. Extraocular muscles intact.  HEENT: Head atraumatic, normocephalic. Oropharynx and nasopharynx clear.  NECK:  Supple, no jugular venous distention. No thyroid enlargement, no tenderness.  LUNGS: Normal breath sounds bilaterally, no wheezing, rales, rhonchi. No use of accessory muscles of respiration.   CARDIOVASCULAR: S1, S2 normal. No murmurs, rubs, or gallops.  ABDOMEN: Soft, nontender, nondistended. Bowel sounds present. No organomegaly or mass.  EXTREMITIES: No cyanosis, clubbing or edema b/l.    NEUROLOGIC: Cranial nerves II through XII are intact. No focal Motor or sensory deficits b/l.   PSYCHIATRIC: The patient is alert and oriented x 3.  SKIN: No obvious rash, lesion, or ulcer.   LABORATORY PANEL:   CBC Recent Labs  Lab 10/10/18 0417  WBC 12.8*  HGB 10.3*  HCT 32.1*  PLT 244   ------------------------------------------------------------------------------------------------------------------ Chemistries  Recent Labs  Lab 10/10/18 0417  NA 137  K 3.6  CL 110  CO2 23  GLUCOSE 97  BUN 22  CREATININE 1.24  CALCIUM 10.3   ------------------------------------------------------------------------------------------------------------------  Cardiac Enzymes No results for input(s): TROPONINI in the last 168 hours. ------------------------------------------------------------------------------------------------------------------  RADIOLOGY:  No results found.   ASSESSMENT AND PLAN:   63 year old male patient with history of congestive heart failure, hypertension currently under hospitalist service for UTI and infection  -Sepsis resolving Secondary to UTI Urine cultures growing staph Switch to oral doxycycline antibiotic Follow-up cultures and lactic acid levels  -Acute UTI with staph Improving Discontinued Rocephin continue oral doxycycline antibiotic  -Bradycardia resolved Restart beta-blocker at low dose Telemetry monitoring Resume digoxin at reduced dose Heart rate again has improved Probably patient vagal down during having bowel movement and diarrhea  -Leukocytosis improved Continue antibiotics  -Diarrhea improved Appears self-limiting PRN Imodium  -Acute kidney injury improving Avoid nephrotoxic medication Continue IV fluids  -Hypotension  improved  -Ambulatory dysfunction Physical therapy evaluation  All the records are reviewed and case discussed with Care Management/Social Worker.  Management plans discussed with the patient, family and they are in agreement.  CODE STATUS: Full code  DVT Prophylaxis: SCDs  TOTAL TIME TAKING CARE OF THIS PATIENT: 37 minutes.   POSSIBLE D/C IN 2 to 3 DAYS, DEPENDING ON CLINICAL CONDITION.  Saundra Shelling M.D on 10/10/2018 at 5:35 PM  Between 7am to 6pm - Pager - [phone]  After 6pm go to [email or web address] - password [email or web address] [place] [facility]  Office  [phone]  CC: Primary care physician; System, Pcp Not In  Note: This dictation was prepared with Dragon dictation along with smaller phrase technology. Any transcriptional errors that result from this process are unintentional.

## 2018-10-10 NOTE — Discharge Summary (Signed)
[facility] NAME: Ruben Clark    MR#:  [id number]  DATE OF BIRTH:  [birth year]  DATE OF ADMISSION:  10/07/2018 ADMITTING PHYSICIAN: Christel Mormon, MD  DATE OF DISCHARGE: 10/10/2018  PRIMARY CARE PHYSICIAN: System, Pcp Not In   ADMISSION DIAGNOSIS:  Urinary tract infection without hematuria, site unspecified [N39.0] Acute renal failure, unspecified acute renal failure type ([place]) [N17.9] Sepsis with acute renal failure without septic shock, due to unspecified organism, unspecified acute renal failure type ([place]) [A41.9, R65.20, N17.9]  DISCHARGE DIAGNOSIS:  Active Problems:   Sepsis ([place]) Acute urinary tract infection Acute kidney injury Leukocytosis Dehydration  SECONDARY DIAGNOSIS:   Past Medical History:  Diagnosis Date  . CHF (congestive heart failure) ([place])   . Hypertension      ADMITTING HISTORY Ruben Clark  is a 63 y.o. male with a known history of CHF and hypertension.  He presented from the group home with a report of recent frequent falls.  Patient reports he has been weak with poor appetite over the last week resulting in frequent falls.  Patient is complaining of being unable to void with lower abdominal pain.  He has noted no hematuria.  No fevers, chills, nausea, vomiting, diarrhea.  He denies chest pain or shortness of breath.  Urinalysis demonstrates moderate leukocytes with greater than 50 WBCs with turbid urine which appears to contain puslike drainage.  He was started on IV antibiotic with Rocephin in the emergency room.  This has been continued.  Urine culture is pending.  Patient also noted to be hypotensive in the emergency room with blood pressure the 8/57.  Leukocytosis with WBC 20.6 and lactic acid 1.7.  Additionally, there is evidence of acute kidney injury with BUN of 52 and creatinine 2.63.  He has been admitted to the hospital service for sepsis with urinary tract infection.  HOSPITAL COURSE:  Patient was  admitted to medical floor started on IV Rocephin antibiotic.  IV fluids were given and lactic acid levels were followed.  Nephrotoxic medications were avoided.  Kidney functions were closely monitored.  Leukocytosis improved with antibiotics.  Sepsis resolved.  Based on urine cultures and sensitivities antibiotic switched to his doxycycline.  Urine culture grew staph aureus.  Overall patient sepsis resolved UTI resolved.  Patient also had some runs of bradycardia during the hospitalization which was secondary to vasovagal effect from diarrhea.  Diarrhea was self-limiting and resolved.  Patient will be discharged to group home with home health services.  Blood cultures did not reveal any growth.  Patient will be discharged on oral doxycycline antibiotic and will continue Eliquis for anticoagulation.  CONSULTS OBTAINED:    DRUG ALLERGIES:  No Known Allergies  DISCHARGE MEDICATIONS:   Allergies as of 10/10/2018   No Known Allergies     Medication List    STOP taking these medications   docusate sodium 100 MG capsule Commonly known as: COLACE   HYDROcodone-acetaminophen 7.5-325 MG tablet Commonly known as: NORCO   nicotine 14 mg/24hr patch Commonly known as: NICODERM CQ - dosed in mg/24 hours     TAKE these medications   apixaban 5 MG Tabs tablet Commonly known as: ELIQUIS Take 1 tablet (5 mg total) by mouth 2 (two) times daily.   aspirin EC 81 MG tablet Take 81 mg by mouth daily.   digoxin 0.25 MG tablet Commonly known as: LANOXIN Take 0.25 mg by mouth daily.   doxycycline 100 MG tablet Commonly known as: VIBRA-TABS  Take 1 tablet (100 mg total) by mouth every 12 (twelve) hours for 6 days.   finasteride 5 MG tablet Commonly known as: PROSCAR Take 1 tablet (5 mg total) by mouth daily.   furosemide 40 MG tablet Commonly known as: LASIX Take 0.5 tablets (20 mg total) by mouth daily.   gabapentin 300 MG capsule Commonly known as: NEURONTIN Take 300 mg by mouth at bedtime.    hydrOXYzine 25 MG tablet Commonly known as: ATARAX/VISTARIL Take 25 mg by mouth every 8 (eight) hours as needed.   lisinopril 2.5 MG tablet Commonly known as: ZESTRIL Take 2.5 mg by mouth daily.   LORazepam 0.5 MG tablet Commonly known as: ATIVAN Take 0.5 tablets (0.25 mg total) by mouth daily.   metoprolol succinate 25 MG 24 hr tablet Commonly known as: TOPROL-XL Take 0.5 tablets (12.5 mg total) by mouth daily. What changed: how much to take       Today  Patient seen and evaluated today Tolerating diet okay No diarrhea No abdominal pain No fever Hemodynamically  VITAL SIGNS:  Blood pressure 109/63, pulse 78, temperature 98.6 F (37 C), temperature source Oral, resp. rate 20, height 5\' 8"  (1.727 m), weight 72.6 kg, SpO2 100 %.  I/O:    Intake/Output Summary (Last 24 hours) at 10/10/2018 1233 Last data filed at 10/10/2018 0654 Gross per 24 hour  Intake 913.19 ml  Output 2450 ml  Net -1536.81 ml    PHYSICAL EXAMINATION:  Physical Exam  GENERAL:  63 y.o.-year-old patient lying in the bed with no acute distress.  LUNGS: Normal breath sounds bilaterally, no wheezing, rales,rhonchi or crepitation. No use of accessory muscles of respiration.  CARDIOVASCULAR: S1, S2 normal. No murmurs, rubs, or gallops.  ABDOMEN: Soft, non-tender, non-distended. Bowel sounds present. No organomegaly or mass.  NEUROLOGIC: Moves all 4 extremities. PSYCHIATRIC: The patient is alert and oriented x 3.  SKIN: No obvious rash, lesion, or ulcer.   DATA REVIEW:   CBC Recent Labs  Lab 10/10/18 0417  WBC 12.8*  HGB 10.3*  HCT 32.1*  PLT 244    Chemistries  Recent Labs  Lab 10/10/18 0417  NA 137  K 3.6  CL 110  CO2 23  GLUCOSE 97  BUN 22  CREATININE 1.24  CALCIUM 10.3    Cardiac Enzymes No results for input(s): TROPONINI in the last 168 hours.  Microbiology Results  Results for orders placed or performed during the hospital encounter of 10/07/18  Urine Culture      Status: Abnormal   Collection Time: 10/07/18  1:00 AM   Specimen: Urine, Random  Result Value Ref Range Status   Specimen Description   Final    URINE, RANDOM Performed at [facility], [address], [place], [place] [address]    Special Requests   Final    Normal Performed at [facility], [address] [place], [place] [address]    Culture (A)  Final    >=100,000 COLONIES/mL METHICILLIN RESISTANT STAPHYLOCOCCUS AUREUS   Report Status 10/09/2018 FINAL  Final   Organism ID, Bacteria METHICILLIN RESISTANT STAPHYLOCOCCUS AUREUS (A)  Final      Susceptibility   Methicillin resistant staphylococcus aureus - MIC*    CIPROFLOXACIN >=8 RESISTANT Resistant     GENTAMICIN <=0.5 SENSITIVE Sensitive     NITROFURANTOIN <=16 SENSITIVE Sensitive     OXACILLIN >=4 RESISTANT Resistant     TETRACYCLINE <=1 SENSITIVE Sensitive     VANCOMYCIN 1 SENSITIVE Sensitive     TRIMETH/SULFA <=  10 SENSITIVE Sensitive     CLINDAMYCIN >=8 RESISTANT Resistant     RIFAMPIN <=0.5 SENSITIVE Sensitive     Inducible Clindamycin NEGATIVE Sensitive     * >=100,000 COLONIES/mL METHICILLIN RESISTANT STAPHYLOCOCCUS AUREUS  Blood Culture (routine x 2)     Status: None (Preliminary result)   Collection Time: 10/07/18  1:00 AM   Specimen: BLOOD  Result Value Ref Range Status   Specimen Description BLOOD RIGHT ANTECUBITAL  Final   Special Requests   Final    BOTTLES DRAWN AEROBIC AND ANAEROBIC Blood Culture adequate volume   Culture   Final    NO GROWTH 3 DAYS Performed at [facility], [address] [place], [place] [address]    Report Status PENDING  Incomplete  Blood Culture (routine x 2)     Status: None (Preliminary result)   Collection Time: 10/07/18  1:00 AM   Specimen: BLOOD  Result Value Ref Range Status   Specimen Description BLOOD LEFT ANTECUBITAL  Final   Special Requests   Final    BOTTLES DRAWN AEROBIC AND ANAEROBIC Blood Culture adequate volume   Culture   Final     NO GROWTH 3 DAYS Performed at [facility], [address] [place], [place] [address]    Report Status PENDING  Incomplete  SARS Coronavirus 2 (CEPHEID - Performed in [place] hospital lab), Hosp Order     Status: None   Collection Time: 10/07/18  1:00 AM   Specimen: Nasopharyngeal Swab  Result Value Ref Range Status   SARS Coronavirus 2 NEGATIVE NEGATIVE Final    Comment: (NOTE) If result is NEGATIVE SARS-CoV-2 target nucleic acids are NOT DETECTED. The SARS-CoV-2 RNA is generally detectable in upper and lower  respiratory specimens during the acute phase of infection. The lowest  concentration of SARS-CoV-2 viral copies this assay can detect is 250  copies / mL. A negative result does not preclude SARS-CoV-2 infection  and should not be used as the sole basis for treatment or other  patient management decisions.  A negative result may occur with  improper specimen collection / handling, submission of specimen other  than nasopharyngeal swab, presence of viral mutation(s) within the  areas targeted by this assay, and inadequate number of viral copies  (<250 copies / mL). A negative result must be combined with clinical  observations, patient history, and epidemiological information. If result is POSITIVE SARS-CoV-2 target nucleic acids are DETECTED. The SARS-CoV-2 RNA is generally detectable in upper and lower  respiratory specimens dur ing the acute phase of infection.  Positive  results are indicative of active infection with SARS-CoV-2.  Clinical  correlation with patient history and other diagnostic information is  necessary to determine patient infection status.  Positive results do  not rule out bacterial infection or co-infection with other viruses. If result is PRESUMPTIVE POSTIVE SARS-CoV-2 nucleic acids MAY BE PRESENT.   A presumptive positive result was obtained on the submitted specimen  and confirmed on repeat testing.  While 2019 novel coronavirus   (SARS-CoV-2) nucleic acids may be present in the submitted sample  additional confirmatory testing may be necessary for epidemiological  and / or clinical management purposes  to differentiate between  SARS-CoV-2 and other Sarbecovirus currently known to infect humans.  If clinically indicated additional testing with an alternate test  methodology [phone]) is advised. The SARS-CoV-2 RNA is generally  detectable in upper and lower respiratory sp ecimens during the acute  phase of infection. The expected result  is Negative. Fact Sheet for Patients:  StrictlyIdeas.no Fact Sheet for Healthcare Providers: BankingDealers.co.za This test is not yet approved or cleared by the [place] FDA and has been authorized for detection and/or diagnosis of SARS-CoV-2 by FDA under an Emergency Use Authorization (EUA).  This EUA will remain in effect (meaning this test can be used) for the duration of the COVID-19 declaration under Section 564(b)(1) of the Act, 21 U.S.C. section 360bbb-3(b)(1), unless the authorization is terminated or revoked sooner. Performed at [facility], [address] [place], [place] [address]   MRSA PCR Screening     Status: Abnormal   Collection Time: 10/07/18  5:00 PM   Specimen: Nasopharyngeal  Result Value Ref Range Status   MRSA by PCR POSITIVE (A) NEGATIVE Final    Comment:        The GeneXpert MRSA Assay (FDA approved for NASAL specimens only), is one component of a comprehensive MRSA colonization surveillance program. It is not intended to diagnose MRSA infection nor to guide or monitor treatment for MRSA infections. RESULT CALLED TO, READ BACK BY AND VERIFIED WITHRochel Brome AT 1821 10/07/2018  TFK Performed at [facility], [address] [place], [place] [address]     RADIOLOGY:  No results found.  Follow up with PCP in 1 week.  Management plans discussed with the patient,  family and they are in agreement.  CODE STATUS: Full code    Code Status Orders  (From admission, onward)         Start     Ordered   10/07/18 0443  Full code  Continuous     10/07/18 0442        Code Status History    Date Active Date Inactive Code Status Order ID Comments User Context   08/02/2018 1502 08/09/2018 1743 Full Code [id number]  Mayo, Pete Pelt, MD Inpatient   Advance Care Planning Activity      TOTAL TIME TAKING CARE OF THIS PATIENT ON DAY OF DISCHARGE: more than 35 minutes.   Saundra Shelling M.D on 10/10/2018 at 12:33 PM  Between 7am to 6pm - Pager - [phone]  After 6pm go to [email or web address] - password [email or web address] [facility]  Office  [phone]  CC: Primary care physician; System, Pcp Not In  Note: This dictation was prepared with Dragon dictation along with smaller phrase technology. Any transcriptional errors that result from this process are unintentional.

## 2018-10-11 NOTE — Discharge Instructions (Signed)
Acute Kidney Injury, Adult  Acute kidney injury is a sudden worsening of kidney function. The kidneys are organs that have several jobs. They filter the blood to remove waste products and extra fluid. They also maintain a healthy balance of minerals and hormones in the body, which helps control blood pressure and keep bones strong. With this condition, your kidneys do not do their jobs as well as they should. This condition ranges from mild to severe. Over time it may develop into long-lasting (chronic) kidney disease. Early detection and treatment may prevent acute kidney injury from developing into a chronic condition. What are the causes? Common causes of this condition include:  A problem with blood flow to the kidneys. This may be caused by: ? Low blood pressure (hypotension) or shock. ? Blood loss. ? Heart and blood vessel (cardiovascular) disease. ? Severe burns. ? Liver disease.  Direct damage to the kidneys. This may be caused by: ? Certain medicines. ? A kidney infection. ? Poisoning. ? Being around or in contact with toxic substances. ? A surgical wound. ? A hard, direct hit to the kidney area.  A sudden blockage of urine flow. This may be caused by: ? Cancer. ? Kidney stones. ? An enlarged prostate in males. What are the signs or symptoms? Symptoms of this condition may not be obvious until the condition becomes severe. Symptoms of this condition can include:  Tiredness (lethargy), or difficulty staying awake.  Nausea or vomiting.  Swelling (edema) of the face, legs, ankles, or feet.  Problems with urination, such as: ? Abdominal pain, or pain along the side of your stomach (flank). ? Decreased urine production. ? Decrease in the force of urine flow.  Muscle twitches and cramps, especially in the legs.  Confusion or trouble concentrating.  Loss of appetite.  Fever. How is this diagnosed? This condition may be diagnosed with tests, including:  Blood  tests.  Urine tests.  Imaging tests.  A test in which a sample of tissue is removed from the kidneys to be examined under a microscope (kidney biopsy). How is this treated? Treatment for this condition depends on the cause and how severe the condition is. In mild cases, treatment may not be needed. The kidneys may heal on their own. In more severe cases, treatment will involve:  Treating the cause of the kidney injury. This may involve changing any medicines you are taking or adjusting your dosage.  Fluids. You may need specialized IV fluids to balance your body's needs.  Having a catheter placed to drain urine and prevent blockages.  Preventing problems from occurring. This may mean avoiding certain medicines or procedures that can cause further injury to the kidneys. In some cases treatment may also require:  A procedure to remove toxic wastes from the body (dialysis or continuous renal replacement therapy - CRRT).  Surgery. This may be done to repair a torn kidney, or to remove the blockage from the urinary system. Follow these instructions at home: Medicines  Take over-the-counter and prescription medicines only as told by your health care provider.  Do not take any new medicines without your health care provider's approval. Many medicines can worsen your kidney damage.  Do not take any vitamin and mineral supplements without your health care provider's approval. Many nutritional supplements can worsen your kidney damage. Lifestyle  If your health care provider prescribed changes to your diet, follow them. You may need to decrease the amount of protein you eat.  Achieve and maintain a  healthy weight. If you need help with this, ask your health care provider.  Start or continue an exercise plan. Try to exercise at least 30 minutes a day, 5 days a week.  Do not use any tobacco products, such as cigarettes, chewing tobacco, and e-cigarettes. If you need help quitting, ask your  health care provider. General instructions  Keep track of your blood pressure. Report changes in your blood pressure as told by your health care provider.  Stay up to date with immunizations. Ask your health care provider which immunizations you need.  Keep all follow-up visits as told by your health care provider. This is important. Where to find more information  American Association of Kidney Patients: BombTimer.gl  National Kidney Foundation: www.kidney.[place]: https://mathis.com/  [facility]: ? www.lifeoptions.org ? www.kidneyschool.org Contact a health care provider if:  Your symptoms get worse.  You develop new symptoms. Get help right away if:  You develop symptoms of worsening kidney disease, which include: ? Headaches. ? Abnormally dark or light skin. ? Easy bruising. ? Frequent hiccups. ? Chest pain. ? Shortness of breath. ? End of menstruation in women. ? Seizures. ? Confusion or altered mental status. ? Abdominal or back pain. ? Itchiness.  You have a fever.  Your body is producing less urine.  You have pain or bleeding when you urinate. Summary  Acute kidney injury is a sudden worsening of kidney function.  Acute kidney injury can be caused by problems with blood flow to the kidneys, direct damage to the kidneys, and sudden blockage of urine flow.  Symptoms of this condition may not be obvious until it becomes severe. Symptoms may include edema, lethargy, confusion, nausea or vomiting, and problems passing urine.  This condition can usually be diagnosed with blood tests, urine tests, and imaging tests. Sometimes a kidney biopsy is done to diagnose this condition.  Treatment for this condition often involves treating the underlying cause. It is treated with fluids, medicines, dialysis, diet changes, or surgery. This information is not intended to replace advice given to you by your health care provider. Make  sure you discuss any questions you have with your health care provider. Document Released: 10/07/2010 Document Revised: 03/06/2017 Document Reviewed: 03/14/2016 Elsevier Patient Education  2020 [place].   Antibiotic Medicine, Adult  Antibiotic medicines are used to treat infections caused by bacteria, such as strep throat and urinary tract infection (UTI). Antibiotic medicines will not work for viral illnesses, such as colds or the flu (influenza). They work by killing the bacteria that is making you sick. Antibiotics can also have serious side effects. It is important that you take antibiotic medicines safely and only when needed. When do I need to take antibiotics? Antibiotics are medicines that treat bacterial infections. You may need antibiotics for:  UTI.  Strep throat.  Meningitis. This infection affects the spinal cord and brain.  Bacterial sinusitis.  Serious lung infection. You may start antibiotics while your health care provider waits for test results to come back. Common tests may include throat, urine, blood, or mucus culture. Your health care provider may change or stop the antibiotic depending on your test results. When are antibiotics not needed? You do not need antibiotics for most common illnesses. These illnesses may be caused by a virus, not a bacteria. You do not need antibiotics for:  The common cold.  Influenza.  Sore throat.  Discolored mucus.  Bronchitis. Antibiotics are not always needed for all bacterial infections.  Many of these infections clear up without antibiotic treatment. Do not ask for or take antibiotics when they are not necessary. How long should I take the antibiotic? You must take the entire prescription. Continue to take your antibiotic for as long as told by your health care provider. Do not stop taking it even if you start to feel better. If you stop taking it too soon:  You may start to feel sick again.  Your infection may  become harder to treat.  Complications may develop. Each course of antibiotics needs a different amount of time to work. Some antibiotic courses last only a few days. Some last about a week to 10 days. In some cases, you may need to take antibiotics for a few weeks to completely treat the infection. What if I miss a dose? Try not to miss any doses of medicine. If you miss a dose, call your health care provider or pharmacist for advice. Sometimes it is okay to take the missed dose as soon as possible. What are the risks of taking antibiotics? Most antibiotics can cause an infection called Clostridioides difficile (C. difficile or C. diff), which causes severe diarrhea. This infection happens when the antibiotics kill the healthy bacteria in your intestines. This allows C. diff to grow. The infection needs to be treated right away. Let your health care provider know if:  You have diarrhea while taking an antibiotic.  You have diarrhea after you stop taking an antibiotic. C. diff infection can start weeks after stopping the antibiotic. Taking an antibiotic also puts you at risk for getting a bacteria that does not respond to medicine (antibiotic-resistant infection) in the future. Antibiotics can cause bacteria to change so that if the antibiotic is taken again, the medicine is not able to kill the bacteria. These infections can be more serious and, in some cases, life-threatening. Do antibiotics affect birth control? Birth control pills may not work while you are on antibiotics. If you are taking birth control pills, continue taking them as usual and use a second form of birth control, such as a condom, to avoid unwanted pregnancy. Continue using the second form of birth control until your health care provider says you can stop. What else should I know about taking antibiotics? It is important for you to take antibiotics exactly as told. Make sure that you:  Take the entire course of antibiotic that  was prescribed. Do not stop taking your antibiotics even if your symptoms improve.  Take the correct amount of medicine each day.  Ask your health care provider: ? How long to wait in between doses. ? If the antibiotic should be taken with food. ? If there are any foods, drinks, or medicines that you should avoid while taking the antibiotics. ? If there are any side effects you should be aware of.  Only use the antibiotics prescribed for you by your health care provider. Do not use antibiotics prescribed for someone else.  Drink a large glass of water along with the antibiotics.  Ask the pharmacist for a syringe, cup, or spoon that properly measures the antibiotics.  Throw away any leftover medicine. Contact a health care provider if:  Your symptoms get worse.  You have new joint pain or muscle aches that begin after starting the antibiotic. When should I seek immediate medical care?  You have signs of a serious allergic reaction to antibiotics. If you have signs of a severe allergic reaction, stop taking the antibiotic right away. Signs  may include: ? Hives, which are raised, itchy, red bumps on the skin. ? Skin rash. ? Trouble breathing. ? A wheezing sound when you breathe. ? Swelling anywhere on your body. ? Feeling dizzy. ? Vomiting.  Your urine turns dark or becomes blood-colored.  Your skin turns yellow.  You bruise or bleed easily.  You have severe diarrhea and abdominal cramps.  You have a severe headache. Summary  Antibiotic medicines are used to treat infections caused by bacteria, such as strep throat and UTIs. It is important that you take antibiotic medicines only when needed.  Your health care provider may change or stop the antibiotic depending on your test results.  Most antibiotics can cause an infection called Clostridioides difficile (C. difficile or C. diff), which causes severe diarrhea. Let your health care provider know if you develop diarrhea  while taking an antibiotic.  Take the entire course of antibiotic that was prescribed. This information is not intended to replace advice given to you by your health care provider. Make sure you discuss any questions you have with your health care provider. Document Released: 12/05/2003 Document Revised: 09/22/2017 Document Reviewed: 03/25/2016 Elsevier Patient Education  2020 [place] to f/u with his PCP in 1-2 weeks

## 2018-10-11 NOTE — Progress Notes (Signed)
Discharge instructions given. Pt verbalizes understanding. 

## 2018-10-11 NOTE — Discharge Summary (Signed)
[facility] NAME: Ruben Clark    MR#:  [id number]  DATE OF BIRTH:  01/22/1956  DATE OF ADMISSION:  10/07/2018 ADMITTING PHYSICIAN: Christel Mormon, MD  DATE OF DISCHARGE: 10/11/2018  PRIMARY CARE PHYSICIAN: System, Pcp Not In    ADMISSION DIAGNOSIS:  Urinary tract infection without hematuria, site unspecified [N39.0] Acute renal failure, unspecified acute renal failure type ([place]) [N17.9] Sepsis with acute renal failure without septic shock, due to unspecified organism, unspecified acute renal failure type ([place]) [A41.9, R65.20, N17.9]  DISCHARGE DIAGNOSIS:  Sepsis on admission--resolved UTI-MRSA SECONDARY DIAGNOSIS:   Past Medical History:  Diagnosis Date  . CHF (congestive heart failure) ([place])   . Hypertension     HOSPITAL COURSE:   63 year old male patient with history of congestive heart failure, hypertension  Came in for UTI and infection  -Sepsis on admission--now resolved Secondary to UTI Urine cultures growing staph aureus-MRSA Switch to oral doxycycline antibiotic BC negative  -Acute UTI with MRSA Discontinue Rocephin Start oral doxycycline antibiotic  -Bradycardia Hold beta-blocker Telemetry monitoring--HR in the 60's resumed digoxin Heart rate again has improved Hold beta-blocker for now  -Leukocytosis improving Continue antibiotics  -Diarrhea Appears self-limiting PRN Imodium  -Acute kidney injury improving Avoid nephrotoxic medication recieved IV fluids -creat 1.2  -Hypotension improved  Overall appears at baseline for d/c to Group home. Patient's catheter has been removed. He is urinating okay.   CONSULTS OBTAINED:    DRUG ALLERGIES:  No Known Allergies  DISCHARGE MEDICATIONS:   Allergies as of 10/11/2018   No Known Allergies     Medication List    STOP taking these medications   docusate sodium 100 MG capsule Commonly known as: COLACE   HYDROcodone-acetaminophen  7.5-325 MG tablet Commonly known as: NORCO   nicotine 14 mg/24hr patch Commonly known as: NICODERM CQ - dosed in mg/24 hours     TAKE these medications   apixaban 5 MG Tabs tablet Commonly known as: ELIQUIS Take 1 tablet (5 mg total) by mouth 2 (two) times daily.   aspirin EC 81 MG tablet Take 81 mg by mouth daily.   digoxin 0.25 MG tablet Commonly known as: LANOXIN Take 0.25 mg by mouth daily.   doxycycline 100 MG tablet Commonly known as: VIBRA-TABS Take 1 tablet (100 mg total) by mouth every 12 (twelve) hours for 6 days.   finasteride 5 MG tablet Commonly known as: PROSCAR Take 1 tablet (5 mg total) by mouth daily.   furosemide 40 MG tablet Commonly known as: LASIX Take 0.5 tablets (20 mg total) by mouth daily.   gabapentin 300 MG capsule Commonly known as: NEURONTIN Take 300 mg by mouth at bedtime.   hydrOXYzine 25 MG tablet Commonly known as: ATARAX/VISTARIL Take 25 mg by mouth every 8 (eight) hours as needed.   lisinopril 2.5 MG tablet Commonly known as: ZESTRIL Take 2.5 mg by mouth daily.   LORazepam 0.5 MG tablet Commonly known as: ATIVAN Take 0.5 tablets (0.25 mg total) by mouth daily.   metoprolol succinate 25 MG 24 hr tablet Commonly known as: TOPROL-XL Take 0.5 tablets (12.5 mg total) by mouth daily. What changed: how much to take       If you experience worsening of your admission symptoms, develop shortness of breath, life threatening emergency, suicidal or homicidal thoughts you must seek medical attention immediately by calling 911 or calling your MD immediately  if symptoms less severe.  You Must read complete instructions/literature along  with all the possible adverse reactions/side effects for all the Medicines you take and that have been prescribed to you. Take any new Medicines after you have completely understood and accept all the possible adverse reactions/side effects.   Please note  You were cared for by a hospitalist during your  hospital stay. If you have any questions about your discharge medications or the care you received while you were in the hospital after you are discharged, you can call the unit and asked to speak with the hospitalist on call if the hospitalist that took care of you is not available. Once you are discharged, your primary care physician will handle any further medical issues. Please note that NO REFILLS for any discharge medications will be authorized once you are discharged, as it is imperative that you return to your primary care physician (or establish a relationship with a primary care physician if you do not have one) for your aftercare needs so that they can reassess your need for medications and monitor your lab values. Today   SUBJECTIVE   No new complaints  VITAL SIGNS:  Blood pressure 132/77, pulse 68, temperature 97.6 F (36.4 C), temperature source Oral, resp. rate 16, height 5\' 8"  (1.727 m), weight 72.6 kg, SpO2 99 %.  I/O:    Intake/Output Summary (Last 24 hours) at 10/11/2018 1314 Last data filed at 10/11/2018 0159 Gross per 24 hour  Intake 240 ml  Output 150 ml  Net 90 ml    PHYSICAL EXAMINATION:  GENERAL:  63 y.o.-year-old patient lying in the bed with no acute distress. weak EYES: Pupils equal, round, reactive to light and accommodation. No scleral icterus. Extraocular muscles intact.  HEENT: Head atraumatic, normocephalic. Oropharynx and nasopharynx clear.  NECK:  Supple, no jugular venous distention. No thyroid enlargement, no tenderness.  LUNGS: Normal breath sounds bilaterally, no wheezing, rales,rhonchi or crepitation. No use of accessory muscles of respiration.  CARDIOVASCULAR: S1, S2 normal. No murmurs, rubs, or gallops.  ABDOMEN: Soft, non-tender, non-distended. Bowel sounds present. No organomegaly or mass.  EXTREMITIES: No pedal edema, cyanosis, or clubbing.  NEUROLOGIC: Cranial nerves II through XII are intact. Muscle strength 5/5 in all extremities. Sensation  intact. Gait not checked.  PSYCHIATRIC: The patient is alert and oriented x 2.  SKIN: No obvious rash, lesion, or ulcer.   DATA REVIEW:   CBC  Recent Labs  Lab 10/10/18 0417  WBC 12.8*  HGB 10.3*  HCT 32.1*  PLT 244    Chemistries  Recent Labs  Lab 10/10/18 0417  NA 137  K 3.6  CL 110  CO2 23  GLUCOSE 97  BUN 22  CREATININE 1.24  CALCIUM 10.3    Microbiology Results   Recent Results (from the past 240 hour(s))  Urine Culture     Status: Abnormal   Collection Time: 10/07/18  1:00 AM   Specimen: Urine, Random  Result Value Ref Range Status   Specimen Description   Final    URINE, RANDOM Performed at [facility], [address], [place], [place] [address]    Special Requests   Final    Normal Performed at [facility], [address] [place], [place] [address]    Culture (A)  Final    >=100,000 COLONIES/mL METHICILLIN RESISTANT STAPHYLOCOCCUS AUREUS   Report Status 10/09/2018 FINAL  Final   Organism ID, Bacteria METHICILLIN RESISTANT STAPHYLOCOCCUS AUREUS (A)  Final      Susceptibility   Methicillin resistant staphylococcus aureus - MIC*  CIPROFLOXACIN >=8 RESISTANT Resistant     GENTAMICIN <=0.5 SENSITIVE Sensitive     NITROFURANTOIN <=16 SENSITIVE Sensitive     OXACILLIN >=4 RESISTANT Resistant     TETRACYCLINE <=1 SENSITIVE Sensitive     VANCOMYCIN 1 SENSITIVE Sensitive     TRIMETH/SULFA <=10 SENSITIVE Sensitive     CLINDAMYCIN >=8 RESISTANT Resistant     RIFAMPIN <=0.5 SENSITIVE Sensitive     Inducible Clindamycin NEGATIVE Sensitive     * >=100,000 COLONIES/mL METHICILLIN RESISTANT STAPHYLOCOCCUS AUREUS  Blood Culture (routine x 2)     Status: None (Preliminary result)   Collection Time: 10/07/18  1:00 AM   Specimen: BLOOD  Result Value Ref Range Status   Specimen Description BLOOD RIGHT ANTECUBITAL  Final   Special Requests   Final    BOTTLES DRAWN AEROBIC AND ANAEROBIC Blood Culture adequate volume   Culture   Final     NO GROWTH 4 DAYS Performed at [facility], [address], [place], [place] [address]    Report Status PENDING  Incomplete  Blood Culture (routine x 2)     Status: None (Preliminary result)   Collection Time: 10/07/18  1:00 AM   Specimen: BLOOD  Result Value Ref Range Status   Specimen Description BLOOD LEFT ANTECUBITAL  Final   Special Requests   Final    BOTTLES DRAWN AEROBIC AND ANAEROBIC Blood Culture adequate volume   Culture   Final    NO GROWTH 4 DAYS Performed at [facility], [address] [place], [place] [address]    Report Status PENDING  Incomplete  SARS Coronavirus 2 (CEPHEID - Performed in [place] hospital lab), Hosp Order     Status: None   Collection Time: 10/07/18  1:00 AM   Specimen: Nasopharyngeal Swab  Result Value Ref Range Status   SARS Coronavirus 2 NEGATIVE NEGATIVE Final    Comment: (NOTE) If result is NEGATIVE SARS-CoV-2 target nucleic acids are NOT DETECTED. The SARS-CoV-2 RNA is generally detectable in upper and lower  respiratory specimens during the acute phase of infection. The lowest  concentration of SARS-CoV-2 viral copies this assay can detect is 250  copies / mL. A negative result does not preclude SARS-CoV-2 infection  and should not be used as the sole basis for treatment or other  patient management decisions.  A negative result may occur with  improper specimen collection / handling, submission of specimen other  than nasopharyngeal swab, presence of viral mutation(s) within the  areas targeted by this assay, and inadequate number of viral copies  (<250 copies / mL). A negative result must be combined with clinical  observations, patient history, and epidemiological information. If result is POSITIVE SARS-CoV-2 target nucleic acids are DETECTED. The SARS-CoV-2 RNA is generally detectable in upper and lower  respiratory specimens dur ing the acute phase of infection.  Positive  results are indicative of active  infection with SARS-CoV-2.  Clinical  correlation with patient history and other diagnostic information is  necessary to determine patient infection status.  Positive results do  not rule out bacterial infection or co-infection with other viruses. If result is PRESUMPTIVE POSTIVE SARS-CoV-2 nucleic acids MAY BE PRESENT.   A presumptive positive result was obtained on the submitted specimen  and confirmed on repeat testing.  While 2019 novel coronavirus  (SARS-CoV-2) nucleic acids may be present in the submitted sample  additional confirmatory testing may be necessary for epidemiological  and / or clinical management purposes  to differentiate between  SARS-CoV-2 and other Sarbecovirus currently known to infect humans.  If clinically indicated additional testing with an alternate test  methodology [phone]) is advised. The SARS-CoV-2 RNA is generally  detectable in upper and lower respiratory sp ecimens during the acute  phase of infection. The expected result is Negative. Fact Sheet for Patients:  StrictlyIdeas.no Fact Sheet for Healthcare Providers: BankingDealers.co.za This test is not yet approved or cleared by the [place] FDA and has been authorized for detection and/or diagnosis of SARS-CoV-2 by FDA under an Emergency Use Authorization (EUA).  This EUA will remain in effect (meaning this test can be used) for the duration of the COVID-19 declaration under Section 564(b)(1) of the Act, 21 U.S.C. section 360bbb-3(b)(1), unless the authorization is terminated or revoked sooner. Performed at [name], [address] [place], [place] [address]   MRSA PCR Screening     Status: Abnormal   Collection Time: 10/07/18  5:00 PM   Specimen: Nasopharyngeal  Result Value Ref Range Status   MRSA by PCR POSITIVE (A) NEGATIVE Final    Comment:        The GeneXpert MRSA Assay (FDA approved for NASAL specimens only), is one  component of a comprehensive MRSA colonization surveillance program. It is not intended to diagnose MRSA infection nor to guide or monitor treatment for MRSA infections. RESULT CALLED TO, READ BACK BY AND VERIFIED WITHRochel Brome AT 1821 10/07/2018  TFK Performed at [facility], [address] [place], [place] [address]     RADIOLOGY:  No results found.   CODE STATUS:     Code Status Orders  (From admission, onward)         Start     Ordered   10/07/18 0443  Full code  Continuous     10/07/18 0442        Code Status History    Date Active Date Inactive Code Status Order ID Comments User Context   08/02/2018 1502 08/09/2018 1743 Full Code [id number]  Mayo, Pete Pelt, MD Inpatient   Advance Care Planning Activity      TOTAL TIME TAKING CARE OF THIS PATIENT: *40* minutes.    Fritzi Mandes M.D on 10/11/2018 at 1:14 PM  Between 7am to 6pm - Pager - [phone] After 6pm go to [email or web address] - password [email or web address] [facility]  Office  [phone]  CC: Primary care physician; System, Pcp Not In

## 2018-10-11 NOTE — Care Management Important Message (Signed)
Important Message  Patient Details  Name: Savio Albrecht MRN: [id number] Date of Birth: Apr 26, 1955   Medicare Important Message Given:  Other (see comment)  2nd one due 7/7. Admission obtained verbal consent on 7/5 @ 12:41 pm.   Juliann Pulse A Anjela Cassara 10/11/2018, 8:47 AM

## 2018-10-11 NOTE — Progress Notes (Signed)
Pt discharged by to group home via transportation from group home. Pt escorted to vehicle in wheelchair by nursing staff.

## 2018-10-11 NOTE — Progress Notes (Signed)
Pt had foley previously removed. Bladder scan of 489 ml, refused to have in/out cath completed. He did void 100 ml in urinal as well as large amount incontinently in bed.

## 2018-10-11 NOTE — Progress Notes (Signed)
Pt dressed and ready for discharge. [name] at group home notified that pt is ready for discharge.

## 2018-10-11 NOTE — TOC Transition Note (Signed)
Transition of Care [facility]) - CM/SW Discharge Note   Patient Details  Name: Ruben Clark MRN: [id number] Date of Birth: [birth year]  [facility] CM/SW Contact:  Beverly Sessions, RN Phone Number: 10/11/2018, 5:20 PM   Clinical Narrative:     Patient to discharge back to group home today.  Foley has been removed [place] notified.  He would like to patient to return to group home, knowing the recommendation is for SNF  [name] at [facility] to arrange transportation for discharge  [facility] home health orders have been placed.  Melissa with [facility] notified of discharge.   Medications Doxycycline and metoprolol called in to [facility].  Spoke with Clair Gulling at pharmacy, the will deliver the medications to the facility.   FL2 faxed to [phone]  Final next level of care: Group Home Barriers to Discharge: Barriers Resolved   Patient Goals and CMS Choice   CMS Medicare.gov Compare Post Acute Care list provided to:: Patient Choice offered to / list presented to : Patient  Discharge Placement                  Name of family member notified: Garnet Sierras Patient and family notified of of transfer: 10/11/18  Discharge Plan and Services In-house Referral: Clinical Social Work   Post Acute Care Choice: Home Health                    HH Arranged: RN, PT [facility] Agency: [place] ([place]) Date [place]: 10/11/18 Time [place]: 1300 Representative spoke with at [place]: [place] ([place]) Interventions     Readmission Risk Interventions Readmission Risk Prevention Plan 10/10/2018  Post Dischage Appt Complete  Medication Screening Complete  Transportation Screening Complete

## 2018-10-12 LAB — CULTURE, BLOOD (ROUTINE X 2)
Culture: NO GROWTH
Culture: NO GROWTH
Special Requests: ADEQUATE
Special Requests: ADEQUATE

## 2018-10-18 ENCOUNTER — Emergency Department: Payer: Medicare HMO

## 2018-10-18 ENCOUNTER — Inpatient Hospital Stay
Admission: EM | Admit: 2018-10-18 | Discharge: 2018-10-21 | DRG: 481 | Disposition: A | Discharge status: Skilled Nursing Facility | Payer: Medicare HMO | Attending: Internal Medicine | Admitting: Internal Medicine

## 2018-10-18 ENCOUNTER — Other Ambulatory Visit: Payer: Self-pay

## 2018-10-18 DIAGNOSIS — S72002A Fracture of unspecified part of neck of left femur, initial encounter for closed fracture: Secondary | ICD-10-CM | POA: Diagnosis present

## 2018-10-18 DIAGNOSIS — I10 Essential (primary) hypertension: Secondary | ICD-10-CM | POA: Diagnosis present

## 2018-10-18 DIAGNOSIS — I130 Hypertensive heart and chronic kidney disease with heart failure and stage 1 through stage 4 chronic kidney disease, or unspecified chronic kidney disease: Secondary | ICD-10-CM | POA: Diagnosis present

## 2018-10-18 DIAGNOSIS — S72092A Other fracture of head and neck of left femur, initial encounter for closed fracture: Secondary | ICD-10-CM | POA: Diagnosis not present

## 2018-10-18 DIAGNOSIS — Z419 Encounter for procedure for purposes other than remedying health state, unspecified: Secondary | ICD-10-CM

## 2018-10-18 DIAGNOSIS — Y92009 Unspecified place in unspecified non-institutional (private) residence as the place of occurrence of the external cause: Secondary | ICD-10-CM

## 2018-10-18 DIAGNOSIS — F17200 Nicotine dependence, unspecified, uncomplicated: Secondary | ICD-10-CM | POA: Diagnosis present

## 2018-10-18 DIAGNOSIS — W1830XA Fall on same level, unspecified, initial encounter: Secondary | ICD-10-CM | POA: Diagnosis present

## 2018-10-18 DIAGNOSIS — I4891 Unspecified atrial fibrillation: Secondary | ICD-10-CM | POA: Diagnosis present

## 2018-10-18 DIAGNOSIS — R296 Repeated falls: Secondary | ICD-10-CM | POA: Diagnosis present

## 2018-10-18 DIAGNOSIS — Z1159 Encounter for screening for other viral diseases: Secondary | ICD-10-CM

## 2018-10-18 DIAGNOSIS — Z7901 Long term (current) use of anticoagulants: Secondary | ICD-10-CM

## 2018-10-18 DIAGNOSIS — Z89422 Acquired absence of other left toe(s): Secondary | ICD-10-CM

## 2018-10-18 DIAGNOSIS — I5022 Chronic systolic (congestive) heart failure: Secondary | ICD-10-CM | POA: Diagnosis present

## 2018-10-18 DIAGNOSIS — I480 Paroxysmal atrial fibrillation: Secondary | ICD-10-CM | POA: Diagnosis present

## 2018-10-18 DIAGNOSIS — N183 Chronic kidney disease, stage 3 (moderate): Secondary | ICD-10-CM | POA: Diagnosis present

## 2018-10-18 DIAGNOSIS — N179 Acute kidney failure, unspecified: Secondary | ICD-10-CM | POA: Diagnosis present

## 2018-10-18 LAB — CBC WITH DIFFERENTIAL/PLATELET
Abs Immature Granulocytes: 0.16 10*3/uL — ABNORMAL HIGH (ref 0.00–0.07)
Basophils Absolute: 0.1 10*3/uL (ref 0.0–0.1)
Basophils Relative: 0 %
Eosinophils Absolute: 0.2 10*3/uL (ref 0.0–0.5)
Eosinophils Relative: 1 %
HCT: 39.2 % (ref 39.0–52.0)
Hemoglobin: 12.5 g/dL — ABNORMAL LOW (ref 13.0–17.0)
Immature Granulocytes: 1 %
Lymphocytes Relative: 5 %
Lymphs Abs: 1.1 10*3/uL (ref 0.7–4.0)
MCH: 29.3 pg (ref 26.0–34.0)
MCHC: 31.9 g/dL (ref 30.0–36.0)
MCV: 91.8 fL (ref 80.0–100.0)
Monocytes Absolute: 1.4 10*3/uL — ABNORMAL HIGH (ref 0.1–1.0)
Monocytes Relative: 7 %
Neutro Abs: 18.4 10*3/uL — ABNORMAL HIGH (ref 1.7–7.7)
Neutrophils Relative %: 86 %
Platelets: 403 10*3/uL — ABNORMAL HIGH (ref 150–400)
RBC: 4.27 MIL/uL (ref 4.22–5.81)
RDW: 14.6 % (ref 11.5–15.5)
WBC: 21.3 10*3/uL — ABNORMAL HIGH (ref 4.0–10.5)
nRBC: 0 % (ref 0.0–0.2)

## 2018-10-18 MED ORDER — ONDANSETRON HCL 4 MG/2ML IJ SOLN
4.0000 mg | Freq: Once | INTRAMUSCULAR | Status: AC
  Administered 2018-10-18: 4 mg via INTRAVENOUS
  Filled 2018-10-18: qty 2

## 2018-10-18 MED ORDER — SODIUM CHLORIDE 0.9 % IV BOLUS
1000.0000 mL | Freq: Once | INTRAVENOUS | Status: AC
  Administered 2018-10-18: 1000 mL via INTRAVENOUS

## 2018-10-18 MED ORDER — MORPHINE SULFATE (PF) 4 MG/ML IV SOLN
4.0000 mg | Freq: Once | INTRAVENOUS | Status: AC
  Administered 2018-10-18: 4 mg via INTRAVENOUS
  Filled 2018-10-18: qty 1

## 2018-10-18 NOTE — ED Provider Notes (Signed)
[name] Emergency Department Provider Note  ____________________________________________  Time seen: Approximately 11:05 PM  I have reviewed the triage vital signs and the nursing notes.   HISTORY  Chief Complaint Fall    HPI Ruben Clark is a 63 y.o. male who presents the emergency department via EMS for complaint of left hip pain after a fall.  Patient lives in a group home, according to the staff the patient had an unwitnessed fall out of his walker onto his left hip.  According to the staff, patient has been in decline after recent admission for sepsis from a gangrenous toe.  This was surgically removed and patient returned to the group home.  Patient has had multiple falls since he returned on 10/11/2018.  According to staff, patient was ambulating appropriately earlier today, had gone outside by himself to smoke.  Patient was unable to bear weight were remove himself from the floor after the fall that prompted EMS being called.  Patient reports that he did not hit his head or lose consciousness.  Staff state that they were on the other side of the wall when this occurred and immediately presented into the room.  Patient was awake, oriented at that time.  Patient does have a legal guardian, they were contacted prior to my assessment and evaluation of the patient.  They give permission to treat including admission and surgery if necessary.  While the patient does have a legal guardian, patient is conversing freely, confirming fax already presented to myself via a group home staff.  Patient denies hitting his head or losing consciousness.  He reports that he was trying to transfer from a chair to his walker when he fell landing on his hip.  Patient's only complaint at this time is left hip pain.  Patient denies any headache, visual changes, neck pain, chest pain, shortness of breath, domino pain, nausea vomiting, diarrhea or constipation.  Patient's group home, [facility], has a crisis line at [phone].  I spoke to the representative who gives permission to treat.  Patient's primary guardian, [name] was unable to be reached after hours.  Patient's guardians phone number is [phone].  Group home number, over the future, phone number is [phone].  Patient's personal caregiver is able to be reached at [phone]        Past Medical History:  Diagnosis Date  . CHF (congestive heart failure) ([place])   . Hypertension     Patient Active Problem List   Diagnosis Date Noted  . Sepsis ([place]) 10/07/2018  . COPD (chronic obstructive pulmonary disease) ([place]) 08/26/2018  . Atrial fibrillation ([place]) 08/26/2018  . Left foot infection 08/02/2018    Past Surgical History:  Procedure Laterality Date  . AMPUTATION Left 08/03/2018   Procedure: AMPUTATION RAY SECOND TOE;  Surgeon: Sharlotte Alamo, DPM;  Location: [facility] ORS;  Service: Podiatry;  Laterality: Left;  . IRRIGATION AND DEBRIDEMENT FOOT Left 08/05/2018   Procedure: IRRIGATION AND DEBRIDEMENT FOOT;  Surgeon: Sharlotte Alamo, DPM;  Location: [facility] ORS;  Service: Podiatry;  Laterality: Left;  . TOE AMPUTATION      Prior to Admission medications   Medication Sig Start Date End Date Taking? Authorizing Provider  apixaban (ELIQUIS) 5 MG TABS tablet Take 1 tablet (5 mg total) by mouth 2 (two) times daily. 08/09/18  Yes Dustin Flock, MD  aspirin EC 81 MG tablet Take 81 mg by mouth daily.   Yes [provider]  digoxin (LANOXIN) 0.25 MG tablet  Take 0.25 mg by mouth daily.   Yes [provider]  docusate sodium (COLACE) 100 MG capsule Take 100 mg by mouth 2 (two) times daily.   Yes [provider]  finasteride (PROSCAR) 5 MG tablet Take 1 tablet (5 mg total) by mouth daily. 08/09/18  Yes Dustin Flock, MD  furosemide (LASIX) 40 MG tablet Take 0.5 tablets (20 mg total) by mouth daily. 08/09/18  Yes Dustin Flock, MD  gabapentin (NEURONTIN) 300 MG capsule Take 300 mg by  mouth 2 (two) times daily.    Yes [provider]  hydrOXYzine (ATARAX/VISTARIL) 25 MG tablet Take 25 mg by mouth every 8 (eight) hours as needed for itching.    Yes [provider]  lisinopril (ZESTRIL) 2.5 MG tablet Take 2.5 mg by mouth daily.   Yes [provider]  loratadine (CLARITIN) 10 MG tablet Take 10 mg by mouth daily.   Yes [provider]  LORazepam (ATIVAN) 0.5 MG tablet Take 0.5 tablets (0.25 mg total) by mouth daily. Patient taking differently: Take 0.5 mg by mouth daily.  08/09/18  Yes Dustin Flock, MD  metoprolol succinate (TOPROL-XL) 25 MG 24 hr tablet Take 0.5 tablets (12.5 mg total) by mouth daily. 10/10/18 11/09/18 Yes PyreddyReatha Harps, MD    Allergies Patient has no known allergies.  No family history on file.  Social History Social History   Tobacco Use  . Smoking status: Current Every Day Smoker  . Smokeless tobacco: Never Used  Substance Use Topics  . Alcohol use: Never    Frequency: Never  . Drug use: Never     Review of Systems  Constitutional: No fever/chills Eyes: No visual changes. No discharge ENT: No upper respiratory complaints. Cardiovascular: no chest pain. Respiratory: no cough. No SOB. Gastrointestinal: No abdominal pain.  No nausea, no vomiting.  No diarrhea.  No constipation. Genitourinary: Negative for dysuria. No hematuria Musculoskeletal: Left hip pain after fall Skin: Negative for rash, abrasions, lacerations, ecchymosis. Neurological: Negative for headaches, focal weakness or numbness. 10-point ROS otherwise negative.  ____________________________________________   PHYSICAL EXAM:  VITAL SIGNS: ED Triage Vitals  Enc Vitals Group     BP 10/18/18 2129 (!) 101/58     Pulse Rate 10/18/18 2129 63     Resp 10/18/18 2129 18     Temp 10/18/18 2129 98 F (36.7 C)     Temp src --      SpO2 10/18/18 2129 100 %     Weight 10/18/18 2130 186 lb (84.4 kg)     Height 10/18/18 2130 5\' 10"  (1.778 m)      Head Circumference --      Peak Flow --      Pain Score 10/18/18 2130 6     Pain Loc --      Pain Edu? --      Excl. in [place]? --      Constitutional: Alert and oriented. Well appearing and in no acute distress. Eyes: Conjunctivae are normal. PERRL. EOMI. Head: Atraumatic. Neck: No stridor.    Cardiovascular: Normal rate, regular rhythm. Normal S1 and S2.  Good peripheral circulation. Respiratory: Normal respiratory effort without tachypnea or retractions. Lungs CTAB. Good air entry to the bases with no decreased or absent breath sounds. Musculoskeletal: Limited range of motion to the left lower extremity.  Neurologic:  Normal speech and language. No gross focal neurologic deficits are appreciated.  Skin:  Skin is warm, dry and intact. No rash noted. Psychiatric: Mood and affect are normal.  Speech and behavior are normal. Patient exhibits appropriate insight and judgement.   ____________________________________________   LABS (all labs ordered are listed, but only abnormal results are displayed)  Labs Reviewed  SARS CORONAVIRUS 2 (HOSPITAL ORDER, [place] [facility])  COMPREHENSIVE METABOLIC PANEL  CBC WITH DIFFERENTIAL/PLATELET   ____________________________________________  EKG   ____________________________________________  RADIOLOGY I personally viewed and evaluated these images as part of my medical decision making, as well as reviewing the written report by the radiologist.  I concur with radiologist finding of left femoral neck fracture  Dg Lumbar Spine 2-3 Views  Result Date: 10/18/2018 CLINICAL DATA:  63 year old male status post fall with pain radiating to the left hip. EXAM: LUMBAR SPINE - 2-3 VIEW COMPARISON:  None. FINDINGS: Normal lumbar segmentation. Bulky endplate spurring in the lower thoracic and lumbar spine. Spina bifida occulta at L5 (normal variant). Preserved vertebral height and alignment. Relatively preserved disc spaces. Sacral ala  appear within normal limits. No acute osseous abnormality identified. Calcified aortic atherosclerosis. Negative abdominal visceral contours. IMPRESSION: 1. No acute osseous abnormality identified in the lumbar spine. 2. Aortic Atherosclerosis (ICD10-I70.0). Electronically Signed   By: Genevie Ann M.D.   On: 10/18/2018 23:01   Dg Pelvis 1-2 Views  Result Date: 10/18/2018 CLINICAL DATA:  63 year old male with left hip pain after fall. EXAM: PELVIS - 1-2 VIEW COMPARISON:  Left femur series today. FINDINGS: Asymmetry of the proximal left femur compatible with left femoral neck fracture, see comparison. Both femoral heads are normally located. Hip joint spaces appear symmetric. Grossly intact proximal right femur. No pelvis fracture identified. SI joints appear within normal limits. Negative visible lower abdominal and pelvic visceral contours. IMPRESSION: 1. Asymmetry of the proximal left femur compatible with left femoral neck fracture, see comparison. 2. No other acute fracture or dislocation identified about the pelvis. Electronically Signed   By: Genevie Ann M.D.   On: 10/18/2018 22:58   Dg Femur Min 2 Views Left  Result Date: 10/18/2018 CLINICAL DATA:  63 year old male status post fall with left hip and thigh pain. EXAM: LEFT FEMUR 2 VIEWS COMPARISON:  None. FINDINGS: Left femoral head is normally located. Visible left pelvis appears intact. There is a mildly impacted fracture of the left femoral neck. The femoral head and intertrochanteric segment remain intact. Left femoral shaft and distal femur are intact. Grossly normal alignment at the left knee. IMPRESSION: 1. Mildly impacted left femoral neck fracture. 2. No other acute fracture or dislocation identified about the left femur. Electronically Signed   By: Genevie Ann M.D.   On: 10/18/2018 22:55    ____________________________________________    PROCEDURES  Procedure(s) performed:    Procedures    Medications  sodium chloride 0.9 % bolus 1,000 mL  (1,000 mLs Intravenous New Bag/Given 10/18/18 2346)  morphine 4 MG/ML injection 4 mg (4 mg Intravenous Given 10/18/18 2344)  ondansetron (ZOFRAN) injection 4 mg (4 mg Intravenous Given 10/18/18 2344)     ____________________________________________   INITIAL IMPRESSION / ASSESSMENT AND PLAN / ED COURSE  Pertinent labs & imaging results that were available during my care of the patient were reviewed by me and considered in my medical decision making (see chart for details).  Review of the [place] CSRS was performed in accordance of the [place] prior to dispensing any controlled drugs.  Clinical Course as of Oct 18 2350  Mon Oct 18, 2018  2347 Patient presented to the emergency department after mechanical fall.  Patient presented via EMS from group  home.  Patient is legal guardian was contacted and gives permission to treat patient while in her care.  This includes treatment in the emergency department as well as admission and surgery if so needed.  Initial imaging reveals comminuted hip fracture on the left side.  I discussed the patient's presentation with orthopedic surgeon, Dr. Rudene Christians.  He advises to admit to the hospitalist service with surgical fixation in the morning.  Basic labs, IV established.  Patient will be admitted to the hospitalist service pending surgical fixation in the morning.   [JC]    Clinical Course User Index [JC] Edmundo Tedesco, Charline Bills, PA-C          Patient's diagnosis is consistent with closed left hip fracture.  Patient presented to the emergency department after mechanical fall.  Patient does have a legal guardian who was contacted and gives permission to treat.  Patient is alert, oriented.  Patient able to answer questions appropriately at this time..  Patient only complains of left hip pain.  Initial imaging reveals left hip fracture.  Orthopedic surgeon on call, Dr. Rudene Christians advises this will require surgical fixation.  Patient will be admitted to the hospital service.  I  discussed the patient with the hospitalist who agrees to admit the patient.  Patient care will be transferred to hospital service at this time.    ____________________________________________  FINAL CLINICAL IMPRESSION(S) / ED DIAGNOSES  Final diagnoses:  Closed fracture of left hip, initial encounter ([place])      NEW MEDICATIONS STARTED DURING THIS VISIT:  ED Discharge Orders    None          This chart was dictated using voice recognition software/Dragon. Despite best efforts to proofread, errors can occur which can change the meaning. Any change was purely unintentional.    Darletta Moll, PA-C 10/18/18 2352    Nance Pear, MD 10/19/18 1520

## 2018-10-18 NOTE — ED Triage Notes (Signed)
Pt to the er for pain to the left thigh following a fall. Pt had a mechanical fall and had to helped up. Pt says he now has pain to the left thigh. Pt denies any head trauma or LOC.

## 2018-10-19 ENCOUNTER — Encounter
Admission: EM | Disposition: A | Discharge status: Skilled Nursing Facility | Payer: Self-pay | Source: Home / Self Care | Attending: Internal Medicine

## 2018-10-19 ENCOUNTER — Inpatient Hospital Stay: Payer: Medicare HMO

## 2018-10-19 ENCOUNTER — Inpatient Hospital Stay: Payer: Medicare HMO | Admitting: Anesthesiology

## 2018-10-19 DIAGNOSIS — N183 Chronic kidney disease, stage 3 (moderate): Secondary | ICD-10-CM | POA: Diagnosis present

## 2018-10-19 DIAGNOSIS — I130 Hypertensive heart and chronic kidney disease with heart failure and stage 1 through stage 4 chronic kidney disease, or unspecified chronic kidney disease: Secondary | ICD-10-CM | POA: Diagnosis present

## 2018-10-19 DIAGNOSIS — S72002A Fracture of unspecified part of neck of left femur, initial encounter for closed fracture: Secondary | ICD-10-CM | POA: Diagnosis present

## 2018-10-19 DIAGNOSIS — I10 Essential (primary) hypertension: Secondary | ICD-10-CM | POA: Diagnosis present

## 2018-10-19 DIAGNOSIS — F17200 Nicotine dependence, unspecified, uncomplicated: Secondary | ICD-10-CM | POA: Diagnosis present

## 2018-10-19 DIAGNOSIS — I5022 Chronic systolic (congestive) heart failure: Secondary | ICD-10-CM | POA: Diagnosis present

## 2018-10-19 DIAGNOSIS — N179 Acute kidney failure, unspecified: Secondary | ICD-10-CM | POA: Diagnosis present

## 2018-10-19 DIAGNOSIS — W1830XA Fall on same level, unspecified, initial encounter: Secondary | ICD-10-CM | POA: Diagnosis present

## 2018-10-19 DIAGNOSIS — Z89422 Acquired absence of other left toe(s): Secondary | ICD-10-CM | POA: Diagnosis not present

## 2018-10-19 DIAGNOSIS — I480 Paroxysmal atrial fibrillation: Secondary | ICD-10-CM | POA: Diagnosis present

## 2018-10-19 DIAGNOSIS — Z1159 Encounter for screening for other viral diseases: Secondary | ICD-10-CM | POA: Diagnosis not present

## 2018-10-19 DIAGNOSIS — S72092A Other fracture of head and neck of left femur, initial encounter for closed fracture: Secondary | ICD-10-CM | POA: Diagnosis present

## 2018-10-19 DIAGNOSIS — Z7901 Long term (current) use of anticoagulants: Secondary | ICD-10-CM | POA: Diagnosis not present

## 2018-10-19 DIAGNOSIS — R296 Repeated falls: Secondary | ICD-10-CM | POA: Diagnosis present

## 2018-10-19 DIAGNOSIS — Y92009 Unspecified place in unspecified non-institutional (private) residence as the place of occurrence of the external cause: Secondary | ICD-10-CM | POA: Diagnosis not present

## 2018-10-19 HISTORY — PX: HIP PINNING,CANNULATED: SHX1758

## 2018-10-19 LAB — CBC
HCT: 33.6 % — ABNORMAL LOW (ref 39.0–52.0)
Hemoglobin: 10.7 g/dL — ABNORMAL LOW (ref 13.0–17.0)
MCH: 29.6 pg (ref 26.0–34.0)
MCHC: 31.8 g/dL (ref 30.0–36.0)
MCV: 93.1 fL (ref 80.0–100.0)
Platelets: 315 10*3/uL (ref 150–400)
RBC: 3.61 MIL/uL — ABNORMAL LOW (ref 4.22–5.81)
RDW: 14.6 % (ref 11.5–15.5)
WBC: 15.3 10*3/uL — ABNORMAL HIGH (ref 4.0–10.5)
nRBC: 0 % (ref 0.0–0.2)

## 2018-10-19 LAB — BASIC METABOLIC PANEL
Anion gap: 7 (ref 5–15)
BUN: 46 mg/dL — ABNORMAL HIGH (ref 8–23)
CO2: 23 mmol/L (ref 22–32)
Calcium: 10.5 mg/dL — ABNORMAL HIGH (ref 8.9–10.3)
Chloride: 109 mmol/L (ref 98–111)
Creatinine, Ser: 1.9 mg/dL — ABNORMAL HIGH (ref 0.61–1.24)
GFR calc Af Amer: 43 mL/min — ABNORMAL LOW (ref >60)
GFR calc non Af Amer: 37 mL/min — ABNORMAL LOW (ref >60)
Glucose, Bld: 98 mg/dL (ref 70–99)
Potassium: 4.9 mmol/L (ref 3.5–5.1)
Sodium: 139 mmol/L (ref 135–145)

## 2018-10-19 LAB — COMPREHENSIVE METABOLIC PANEL
ALT: 30 U/L (ref 0–44)
AST: 28 U/L (ref 15–41)
Albumin: 3.2 g/dL — ABNORMAL LOW (ref 3.5–5.0)
Alkaline Phosphatase: 66 U/L (ref 38–126)
Anion gap: 8 (ref 5–15)
BUN: 49 mg/dL — ABNORMAL HIGH (ref 8–23)
CO2: 24 mmol/L (ref 22–32)
Calcium: 11.4 mg/dL — ABNORMAL HIGH (ref 8.9–10.3)
Chloride: 104 mmol/L (ref 98–111)
Creatinine, Ser: 1.87 mg/dL — ABNORMAL HIGH (ref 0.61–1.24)
GFR calc Af Amer: 44 mL/min — ABNORMAL LOW (ref >60)
GFR calc non Af Amer: 38 mL/min — ABNORMAL LOW (ref >60)
Glucose, Bld: 126 mg/dL — ABNORMAL HIGH (ref 70–99)
Potassium: 5.4 mmol/L — ABNORMAL HIGH (ref 3.5–5.1)
Sodium: 136 mmol/L (ref 135–145)
Total Bilirubin: 0.6 mg/dL (ref 0.3–1.2)
Total Protein: 7.4 g/dL (ref 6.5–8.1)

## 2018-10-19 LAB — MRSA PCR SCREENING: MRSA by PCR: POSITIVE — AB

## 2018-10-19 LAB — SARS CORONAVIRUS 2 BY RT PCR (HOSPITAL ORDER, PERFORMED IN ~~LOC~~ HOSPITAL LAB): SARS Coronavirus 2: NEGATIVE

## 2018-10-19 SURGERY — FIXATION, FEMUR, NECK, PERCUTANEOUS, USING SCREW
Anesthesia: General | Site: Hip | Laterality: Left

## 2018-10-19 MED ORDER — CEFAZOLIN SODIUM-DEXTROSE 2-4 GM/100ML-% IV SOLN
2.0000 g | Freq: Four times a day (QID) | INTRAVENOUS | Status: AC
  Administered 2018-10-19 – 2018-10-20 (×3): 2 g via INTRAVENOUS
  Filled 2018-10-19 (×3): qty 100

## 2018-10-19 MED ORDER — MORPHINE SULFATE (PF) 4 MG/ML IV SOLN: 4.0000 mg | INTRAVENOUS | Status: DC | PRN

## 2018-10-19 MED ORDER — MIDAZOLAM HCL 2 MG/2ML IJ SOLN
INTRAMUSCULAR | Status: DC | PRN
  Administered 2018-10-19: 2 mg via INTRAVENOUS

## 2018-10-19 MED ORDER — METHOCARBAMOL 500 MG PO TABS
500.0000 mg | ORAL_TABLET | Freq: Four times a day (QID) | ORAL | Status: DC | PRN
  Administered 2018-10-20 (×2): 500 mg via ORAL
  Filled 2018-10-19 (×2): qty 1

## 2018-10-19 MED ORDER — SENNA 8.6 MG PO TABS: 1.0000 | ORAL_TABLET | Freq: Every day | ORAL | Status: DC | PRN

## 2018-10-19 MED ORDER — PHENYLEPHRINE HCL-NACL 20-0.9 MG/250ML-% IV SOLN
INTRAVENOUS | Status: DC | PRN
  Administered 2018-10-19: 45 ug/min via INTRAVENOUS

## 2018-10-19 MED ORDER — ONDANSETRON HCL 4 MG/2ML IJ SOLN: 4.0000 mg | Freq: Four times a day (QID) | INTRAMUSCULAR | Status: DC | PRN

## 2018-10-19 MED ORDER — PROPOFOL 10 MG/ML IV BOLUS
INTRAVENOUS | Status: AC
  Filled 2018-10-19: qty 20

## 2018-10-19 MED ORDER — PHENOL 1.4 % MT LIQD
1.0000 | OROMUCOSAL | Status: DC | PRN
  Filled 2018-10-19: qty 177

## 2018-10-19 MED ORDER — APIXABAN 5 MG PO TABS: 5.0000 mg | ORAL_TABLET | Freq: Two times a day (BID) | ORAL | Status: DC

## 2018-10-19 MED ORDER — METOPROLOL SUCCINATE ER 25 MG PO TB24
12.5000 mg | ORAL_TABLET | Freq: Every day | ORAL | Status: DC
  Administered 2018-10-20 – 2018-10-21 (×2): 12.5 mg via ORAL
  Filled 2018-10-19 (×2): qty 1

## 2018-10-19 MED ORDER — ACETAMINOPHEN 650 MG RE SUPP: 650.0000 mg | Freq: Four times a day (QID) | RECTAL | Status: DC | PRN

## 2018-10-19 MED ORDER — LIDOCAINE HCL (CARDIAC) PF 100 MG/5ML IV SOSY
PREFILLED_SYRINGE | INTRAVENOUS | Status: DC | PRN
  Administered 2018-10-19: 80 mg via INTRAVENOUS

## 2018-10-19 MED ORDER — METOCLOPRAMIDE HCL 10 MG PO TABS: 5.0000 mg | ORAL_TABLET | Freq: Three times a day (TID) | ORAL | Status: DC | PRN

## 2018-10-19 MED ORDER — LACTATED RINGERS IV SOLN
INTRAVENOUS | Status: DC | PRN
  Administered 2018-10-19: 16:00:00 via INTRAVENOUS

## 2018-10-19 MED ORDER — ZOLPIDEM TARTRATE 5 MG PO TABS: 5.0000 mg | ORAL_TABLET | Freq: Every evening | ORAL | Status: DC | PRN

## 2018-10-19 MED ORDER — DIGOXIN 250 MCG PO TABS
0.2500 mg | ORAL_TABLET | Freq: Every day | ORAL | Status: DC
  Administered 2018-10-20 – 2018-10-21 (×2): 0.25 mg via ORAL
  Filled 2018-10-19 (×3): qty 1

## 2018-10-19 MED ORDER — FENTANYL CITRATE (PF) 100 MCG/2ML IJ SOLN
INTRAMUSCULAR | Status: AC
  Filled 2018-10-19: qty 2

## 2018-10-19 MED ORDER — DOCUSATE SODIUM 100 MG PO CAPS
100.0000 mg | ORAL_CAPSULE | Freq: Two times a day (BID) | ORAL | Status: DC
  Administered 2018-10-19 – 2018-10-20 (×3): 100 mg via ORAL
  Filled 2018-10-19 (×4): qty 1

## 2018-10-19 MED ORDER — BISACODYL 5 MG PO TBEC
5.0000 mg | DELAYED_RELEASE_TABLET | Freq: Every day | ORAL | Status: DC | PRN
  Filled 2018-10-19: qty 1

## 2018-10-19 MED ORDER — ONDANSETRON HCL 4 MG PO TABS: 4.0000 mg | ORAL_TABLET | Freq: Four times a day (QID) | ORAL | Status: DC | PRN

## 2018-10-19 MED ORDER — LIDOCAINE HCL (PF) 2 % IJ SOLN
INTRAMUSCULAR | Status: AC
  Filled 2018-10-19: qty 10

## 2018-10-19 MED ORDER — OXYCODONE HCL 5 MG PO TABS: 5.0000 mg | ORAL_TABLET | Freq: Once | ORAL | Status: DC | PRN

## 2018-10-19 MED ORDER — MAGNESIUM HYDROXIDE 400 MG/5ML PO SUSP
30.0000 mL | Freq: Every day | ORAL | Status: DC | PRN
  Administered 2018-10-20: 30 mL via ORAL
  Filled 2018-10-19: qty 30

## 2018-10-19 MED ORDER — OXYCODONE HCL 5 MG PO TABS
5.0000 mg | ORAL_TABLET | ORAL | Status: DC | PRN
  Administered 2018-10-19 – 2018-10-21 (×6): 5 mg via ORAL
  Filled 2018-10-19 (×6): qty 1

## 2018-10-19 MED ORDER — MIDAZOLAM HCL 2 MG/2ML IJ SOLN
INTRAMUSCULAR | Status: AC
  Filled 2018-10-19: qty 2

## 2018-10-19 MED ORDER — MAGNESIUM CITRATE PO SOLN
1.0000 | Freq: Once | ORAL | Status: DC | PRN
  Filled 2018-10-19: qty 296

## 2018-10-19 MED ORDER — GABAPENTIN 300 MG PO CAPS
300.0000 mg | ORAL_CAPSULE | Freq: Two times a day (BID) | ORAL | Status: DC
  Administered 2018-10-19 – 2018-10-21 (×4): 300 mg via ORAL
  Filled 2018-10-19 (×4): qty 1

## 2018-10-19 MED ORDER — LORAZEPAM 0.5 MG PO TABS
0.5000 mg | ORAL_TABLET | Freq: Every day | ORAL | Status: DC
  Administered 2018-10-20 – 2018-10-21 (×2): 0.5 mg via ORAL
  Filled 2018-10-19 (×2): qty 1

## 2018-10-19 MED ORDER — VASOPRESSIN 20 UNIT/ML IV SOLN
INTRAVENOUS | Status: DC | PRN
  Administered 2018-10-19 (×3): 1 [IU] via INTRAVENOUS

## 2018-10-19 MED ORDER — HYDROXYZINE HCL 25 MG PO TABS
25.0000 mg | ORAL_TABLET | Freq: Three times a day (TID) | ORAL | Status: DC | PRN
  Filled 2018-10-19: qty 1

## 2018-10-19 MED ORDER — FENTANYL CITRATE (PF) 100 MCG/2ML IJ SOLN: 25.0000 ug | INTRAMUSCULAR | Status: DC | PRN

## 2018-10-19 MED ORDER — BISACODYL 10 MG RE SUPP: 10.0000 mg | Freq: Every day | RECTAL | Status: DC | PRN

## 2018-10-19 MED ORDER — ALUM & MAG HYDROXIDE-SIMETH 200-200-20 MG/5ML PO SUSP: 30.0000 mL | ORAL | Status: DC | PRN

## 2018-10-19 MED ORDER — MUPIROCIN 2 % EX OINT
1.0000 "application " | TOPICAL_OINTMENT | Freq: Two times a day (BID) | CUTANEOUS | Status: DC
  Administered 2018-10-19 – 2018-10-21 (×5): 1 via NASAL
  Filled 2018-10-19: qty 22

## 2018-10-19 MED ORDER — APIXABAN 5 MG PO TABS
5.0000 mg | ORAL_TABLET | Freq: Two times a day (BID) | ORAL | Status: DC
  Administered 2018-10-21: 5 mg via ORAL
  Filled 2018-10-19: qty 1

## 2018-10-19 MED ORDER — PHENYLEPHRINE HCL (PRESSORS) 10 MG/ML IV SOLN
INTRAVENOUS | Status: DC | PRN
  Administered 2018-10-19: 200 ug via INTRAVENOUS
  Administered 2018-10-19: 100 ug via INTRAVENOUS
  Administered 2018-10-19: 200 ug via INTRAVENOUS

## 2018-10-19 MED ORDER — SODIUM CHLORIDE 0.9 % IV SOLN
INTRAVENOUS | Status: DC
  Administered 2018-10-19: 20:00:00 via INTRAVENOUS

## 2018-10-19 MED ORDER — CEFAZOLIN SODIUM-DEXTROSE 1-4 GM/50ML-% IV SOLN
1.0000 g | Freq: Once | INTRAVENOUS | Status: AC
  Administered 2018-10-19: 16:00:00 1 g via INTRAVENOUS
  Filled 2018-10-19: qty 50

## 2018-10-19 MED ORDER — CHLORHEXIDINE GLUCONATE CLOTH 2 % EX PADS
6.0000 | MEDICATED_PAD | Freq: Every day | CUTANEOUS | Status: DC
  Administered 2018-10-19 – 2018-10-21 (×3): 6 via TOPICAL

## 2018-10-19 MED ORDER — LORATADINE 10 MG PO TABS
10.0000 mg | ORAL_TABLET | Freq: Every day | ORAL | Status: DC
  Filled 2018-10-19 (×2): qty 1

## 2018-10-19 MED ORDER — METHOCARBAMOL 1000 MG/10ML IJ SOLN
500.0000 mg | Freq: Four times a day (QID) | INTRAVENOUS | Status: DC | PRN
  Filled 2018-10-19: qty 5

## 2018-10-19 MED ORDER — MENTHOL 3 MG MT LOZG
1.0000 | LOZENGE | OROMUCOSAL | Status: DC | PRN
  Filled 2018-10-19: qty 9

## 2018-10-19 MED ORDER — PROPOFOL 10 MG/ML IV BOLUS
INTRAVENOUS | Status: DC | PRN
  Administered 2018-10-19: 150 mg via INTRAVENOUS

## 2018-10-19 MED ORDER — FENTANYL CITRATE (PF) 100 MCG/2ML IJ SOLN
INTRAMUSCULAR | Status: DC | PRN
  Administered 2018-10-19: 50 ug via INTRAVENOUS

## 2018-10-19 MED ORDER — DOCUSATE SODIUM 100 MG PO CAPS: 100.0000 mg | ORAL_CAPSULE | Freq: Two times a day (BID) | ORAL | Status: DC

## 2018-10-19 MED ORDER — ACETAMINOPHEN 325 MG PO TABS
650.0000 mg | ORAL_TABLET | Freq: Four times a day (QID) | ORAL | Status: DC | PRN
  Administered 2018-10-20: 08:00:00 650 mg via ORAL
  Filled 2018-10-19: qty 2

## 2018-10-19 MED ORDER — FINASTERIDE 5 MG PO TABS
5.0000 mg | ORAL_TABLET | Freq: Every day | ORAL | Status: DC
  Administered 2018-10-20 – 2018-10-21 (×2): 5 mg via ORAL
  Filled 2018-10-19 (×2): qty 1

## 2018-10-19 MED ORDER — OXYCODONE HCL 5 MG/5ML PO SOLN: 5.0000 mg | Freq: Once | ORAL | Status: DC | PRN

## 2018-10-19 MED ORDER — APIXABAN 2.5 MG PO TABS
2.5000 mg | ORAL_TABLET | Freq: Two times a day (BID) | ORAL | Status: AC
  Administered 2018-10-20 (×2): 2.5 mg via ORAL
  Filled 2018-10-19 (×2): qty 1

## 2018-10-19 MED ORDER — METOCLOPRAMIDE HCL 5 MG/ML IJ SOLN: 5.0000 mg | Freq: Three times a day (TID) | INTRAMUSCULAR | Status: DC | PRN

## 2018-10-19 SURGICAL SUPPLY — 34 items
CANISTER SUCT 1200ML W/VALVE (MISCELLANEOUS) ×3 IMPLANT
CHLORAPREP W/TINT 26 (MISCELLANEOUS) ×3 IMPLANT
COVER WAND RF STERILE (DRAPES) ×3 IMPLANT
DRSG TEGADERM 6X8 (GAUZE/BANDAGES/DRESSINGS) ×3 IMPLANT
ELECT REM PT RETURN 9FT ADLT (ELECTROSURGICAL) ×3
ELECTRODE REM PT RTRN 9FT ADLT (ELECTROSURGICAL) ×1 IMPLANT
GAUZE SPONGE 4X4 12PLY STRL (GAUZE/BANDAGES/DRESSINGS) ×3 IMPLANT
GAUZE XEROFORM 1X8 LF (GAUZE/BANDAGES/DRESSINGS) ×3 IMPLANT
GLOVE BIOGEL PI IND STRL 9 (GLOVE) ×1 IMPLANT
GLOVE BIOGEL PI INDICATOR 9 (GLOVE) ×2
GLOVE SURG SYN 9.0  PF PI (GLOVE) ×2
GLOVE SURG SYN 9.0 PF PI (GLOVE) ×1 IMPLANT
GOWN SRG 2XL LVL 4 RGLN SLV (GOWNS) ×1 IMPLANT
GOWN STRL NON-REIN 2XL LVL4 (GOWNS) ×2
GOWN STRL REUS W/ TWL LRG LVL3 (GOWN DISPOSABLE) ×1 IMPLANT
GOWN STRL REUS W/TWL LRG LVL3 (GOWN DISPOSABLE) ×2
GUIDEWIRE THREADED 2.8 (WIRE) ×4 IMPLANT
GUIDEWIRE THREADED 2.8MM (WIRE) ×4 IMPLANT
KIT TURNOVER KIT A (KITS) ×3 IMPLANT
NDL FILTER BLUNT 18X1 1/2 (NEEDLE) ×1 IMPLANT
NEEDLE FILTER BLUNT 18X 1/2SAF (NEEDLE) ×2
NEEDLE FILTER BLUNT 18X1 1/2 (NEEDLE) ×1 IMPLANT
NS IRRIG 500ML POUR BTL (IV SOLUTION) ×3 IMPLANT
PACK HIP COMPR (MISCELLANEOUS) ×3 IMPLANT
SCALPEL PROTECTED #10 DISP (BLADE) ×6 IMPLANT
SCREW CANN 6.5X100 32MM THRD (Screw) ×2 IMPLANT
SCREW CANN 6.5X105 32MM THRD (Screw) ×2 IMPLANT
SCREW CANN 6.5X95 (Screw) ×4 IMPLANT
STAPLER SKIN PROX 35W (STAPLE) ×3 IMPLANT
SUT PROLENE 2 0 FS (SUTURE) ×3 IMPLANT
SUT VIC AB 2-0 SH 27 (SUTURE) ×2
SUT VIC AB 2-0 SH 27XBRD (SUTURE) ×1 IMPLANT
SYR 5ML LL (SYRINGE) ×3 IMPLANT
TAPE MICROFOAM 4IN (TAPE) ×3 IMPLANT

## 2018-10-19 NOTE — Anesthesia Postprocedure Evaluation (Signed)
Anesthesia Post Note  Patient: Ruben Clark  Procedure(s) Performed: CANNULATED HIP PINNING (Left Hip)  Patient location during evaluation: PACU Anesthesia Type: General Level of consciousness: awake and alert Pain management: pain level controlled Vital Signs Assessment: post-procedure vital signs reviewed and stable Respiratory status: spontaneous breathing and respiratory function stable Cardiovascular status: stable Anesthetic complications: no     Last Vitals:  Vitals:   10/19/18 1711 10/19/18 1716  BP:  117/67  Pulse: 65   Resp: 13 15  Temp:    SpO2: 99% 100%    Last Pain:  Vitals:   10/19/18 1716  TempSrc:   PainSc: Asleep                 Loveda Colaizzi K

## 2018-10-19 NOTE — Anesthesia Post-op Follow-up Note (Signed)
Anesthesia QCDR form completed.        

## 2018-10-19 NOTE — ED Notes (Addendum)
ED TO INPATIENT HANDOFF REPORT  ED Nurse Name and Phone #: Terri Piedra [phone]  S Name/Age/Gender Ruben Clark 63 y.o. male Room/Bed: ED54A/ED54A  Code Status   Code Status: Prior  Home/SNF/Other Nursing Home Patient oriented to: self, place, time and situation Is this baseline? Yes   Triage Complete: Triage complete  Chief Complaint knee pain ems  Triage Note Pt to the er for pain to the left thigh following a fall. Pt had a mechanical fall and had to helped up. Pt says he now has pain to the left thigh. Pt denies any head trauma or LOC.   Allergies No Known Allergies  Level of Care/Admitting Diagnosis ED Disposition    ED Disposition Condition [place] [facility]: [place] [[id number]]  Level of Care: Med-Surg [16]  Covid Evaluation: Asymptomatic Screening Protocol (No Symptoms)  Diagnosis: Closed left hip fracture [facility]) [[id number]]  Admitting Physician: Lance Coon [[id number]]  Attending Physician: Lance Coon [phone]  Estimated length of stay: past midnight tomorrow  Certification:: I certify this patient will need inpatient services for at least 2 midnights  PT Class (Do Not Modify): Inpatient [101]  PT Acc Code (Do Not Modify): Private [1]       B Medical/Surgery History Past Medical History:  Diagnosis Date  . CHF (congestive heart failure) ([place])   . Hypertension    Past Surgical History:  Procedure Laterality Date  . AMPUTATION Left 08/03/2018   Procedure: AMPUTATION RAY SECOND TOE;  Surgeon: Sharlotte Alamo, DPM;  Location: [facility] ORS;  Service: Podiatry;  Laterality: Left;  . IRRIGATION AND DEBRIDEMENT FOOT Left 08/05/2018   Procedure: IRRIGATION AND DEBRIDEMENT FOOT;  Surgeon: Sharlotte Alamo, DPM;  Location: [facility] ORS;  Service: Podiatry;  Laterality: Left;  . TOE AMPUTATION       A IV Location/Drains/Wounds Patient Lines/Drains/Airways Status   Active Line/Drains/Airways    Name:   Placement date:   Placement time:   Site:    Days:   Peripheral IV 10/18/18 Right Antecubital   10/18/18    2341    Antecubital   1   PICC Single Lumen 67/12/45 PICC Left Basilic 45 cm 0 cm   80/99/83    3825    Basilic   75   Incision (Closed) 08/03/18 Foot Left   08/03/18    1321     77   Incision (Closed) 08/05/18 Foot Left   08/05/18    1249     75   Wound / Incision (Open or Dehisced) 08/02/18 Other (Comment) Foot Left   08/02/18    1537    Foot   78          Intake/Output Last 24 hours No intake or output data in the 24 hours ending 10/19/18 0109  Labs/Imaging Results for orders placed or performed during the hospital encounter of 10/18/18 (from the past 48 hour(s))  Comprehensive metabolic panel     Status: Abnormal   Collection Time: 10/18/18 11:46 PM  Result Value Ref Range   Sodium 136 135 - 145 mmol/L   Potassium 5.4 (H) 3.5 - 5.1 mmol/L   Chloride 104 98 - 111 mmol/L   CO2 24 22 - 32 mmol/L   Glucose, Bld 126 (H) 70 - 99 mg/dL   BUN 49 (H) 8 - 23 mg/dL   Creatinine, Ser 1.87 (H) 0.61 - 1.24 mg/dL   Calcium 11.4 (H) 8.9 - 10.3 mg/dL   Total Protein 7.4 6.5 - 8.1 g/dL  Albumin 3.2 (L) 3.5 - 5.0 g/dL   AST 28 15 - 41 U/L   ALT 30 0 - 44 U/L   Alkaline Phosphatase 66 38 - 126 U/L   Total Bilirubin 0.6 0.3 - 1.2 mg/dL   GFR calc non Af Amer 38 (L) >60 mL/min   GFR calc Af Amer 44 (L) >60 mL/min   Anion gap 8 5 - 15    Comment: Performed at [facility], [address] [place], [place] [address]  CBC with Differential     Status: Abnormal   Collection Time: 10/18/18 11:46 PM  Result Value Ref Range   WBC 21.3 (H) 4.0 - 10.5 K/uL   RBC 4.27 4.22 - 5.81 MIL/uL   Hemoglobin 12.5 (L) 13.0 - 17.0 g/dL   HCT 39.2 39.0 - 52.0 %   MCV 91.8 80.0 - 100.0 fL   MCH 29.3 26.0 - 34.0 pg   MCHC 31.9 30.0 - 36.0 g/dL   RDW 14.6 11.5 - 15.5 %   Platelets 403 (H) 150 - 400 K/uL   nRBC 0.0 0.0 - 0.2 %   Neutrophils Relative % 86 %   Neutro Abs 18.4 (H) 1.7 - 7.7 K/uL   Lymphocytes Relative 5 %   Lymphs Abs  1.1 0.7 - 4.0 K/uL   Monocytes Relative 7 %   Monocytes Absolute 1.4 (H) 0.1 - 1.0 K/uL   Eosinophils Relative 1 %   Eosinophils Absolute 0.2 0.0 - 0.5 K/uL   Basophils Relative 0 %   Basophils Absolute 0.1 0.0 - 0.1 K/uL   Immature Granulocytes 1 %   Abs Immature Granulocytes 0.16 (H) 0.00 - 0.07 K/uL    Comment: Performed at [facility], [address], [place], [place] [address]  SARS Coronavirus 2 (CEPHEID - Performed in [place] hospital lab), Hosp Order     Status: None   Collection Time: 10/18/18 11:46 PM   Specimen: Nasopharyngeal Swab  Result Value Ref Range   SARS Coronavirus 2 NEGATIVE NEGATIVE    Comment: (NOTE) If result is NEGATIVE SARS-CoV-2 target nucleic acids are NOT DETECTED. The SARS-CoV-2 RNA is generally detectable in upper and lower  respiratory specimens during the acute phase of infection. The lowest  concentration of SARS-CoV-2 viral copies this assay can detect is 250  copies / mL. A negative result does not preclude SARS-CoV-2 infection  and should not be used as the sole basis for treatment or other  patient management decisions.  A negative result may occur with  improper specimen collection / handling, submission of specimen other  than nasopharyngeal swab, presence of viral mutation(s) within the  areas targeted by this assay, and inadequate number of viral copies  (<250 copies / mL). A negative result must be combined with clinical  observations, patient history, and epidemiological information. If result is POSITIVE SARS-CoV-2 target nucleic acids are DETECTED. The SARS-CoV-2 RNA is generally detectable in upper and lower  respiratory specimens dur ing the acute phase of infection.  Positive  results are indicative of active infection with SARS-CoV-2.  Clinical  correlation with patient history and other diagnostic information is  necessary to determine patient infection status.  Positive results do  not rule out bacterial infection  or co-infection with other viruses. If result is PRESUMPTIVE POSTIVE SARS-CoV-2 nucleic acids MAY BE PRESENT.   A presumptive positive result was obtained on the submitted specimen  and confirmed on repeat testing.  While 2019 novel coronavirus  (SARS-CoV-2) nucleic acids may  be present in the submitted sample  additional confirmatory testing may be necessary for epidemiological  and / or clinical management purposes  to differentiate between  SARS-CoV-2 and other Sarbecovirus currently known to infect humans.  If clinically indicated additional testing with an alternate test  methodology [id number]) is advised. The SARS-CoV-2 RNA is generally  detectable in upper and lower respiratory sp ecimens during the acute  phase of infection. The expected result is Negative. Fact Sheet for Patients:  StrictlyIdeas.no Fact Sheet for Healthcare Providers: BankingDealers.co.za This test is not yet approved or cleared by the [place] FDA and has been authorized for detection and/or diagnosis of SARS-CoV-2 by FDA under an Emergency Use Authorization (EUA).  This EUA will remain in effect (meaning this test can be used) for the duration of the COVID-19 declaration under Section 564(b)(1) of the Act, 21 U.S.C. section 360bbb-3(b)(1), unless the authorization is terminated or revoked sooner. Performed at [facility], [address], [place], [place] [address]    Dg Chest 1 View  Result Date: 10/19/2018 CLINICAL DATA:  64 y/o  M; status post fall. EXAM: CHEST  1 VIEW COMPARISON:  10/07/2018 chest radiograph FINDINGS: Stable heart size and mediastinal contours are within normal limits given projection and technique. Both lungs are clear. The visualized skeletal structures are unremarkable. IMPRESSION: No active disease. Electronically Signed   By: Kristine Garbe M.D.   On: 10/19/2018 00:19   Dg Lumbar Spine 2-3 Views  Result Date:  10/18/2018 CLINICAL DATA:  63 year old male status post fall with pain radiating to the left hip. EXAM: LUMBAR SPINE - 2-3 VIEW COMPARISON:  None. FINDINGS: Normal lumbar segmentation. Bulky endplate spurring in the lower thoracic and lumbar spine. Spina bifida occulta at L5 (normal variant). Preserved vertebral height and alignment. Relatively preserved disc spaces. Sacral ala appear within normal limits. No acute osseous abnormality identified. Calcified aortic atherosclerosis. Negative abdominal visceral contours. IMPRESSION: 1. No acute osseous abnormality identified in the lumbar spine. 2. Aortic Atherosclerosis (ICD10-I70.0). Electronically Signed   By: Genevie Ann M.D.   On: 10/18/2018 23:01   Dg Pelvis 1-2 Views  Result Date: 10/18/2018 CLINICAL DATA:  63 year old male with left hip pain after fall. EXAM: PELVIS - 1-2 VIEW COMPARISON:  Left femur series today. FINDINGS: Asymmetry of the proximal left femur compatible with left femoral neck fracture, see comparison. Both femoral heads are normally located. Hip joint spaces appear symmetric. Grossly intact proximal right femur. No pelvis fracture identified. SI joints appear within normal limits. Negative visible lower abdominal and pelvic visceral contours. IMPRESSION: 1. Asymmetry of the proximal left femur compatible with left femoral neck fracture, see comparison. 2. No other acute fracture or dislocation identified about the pelvis. Electronically Signed   By: Genevie Ann M.D.   On: 10/18/2018 22:58   Dg Femur Min 2 Views Left  Result Date: 10/18/2018 CLINICAL DATA:  63 year old male status post fall with left hip and thigh pain. EXAM: LEFT FEMUR 2 VIEWS COMPARISON:  None. FINDINGS: Left femoral head is normally located. Visible left pelvis appears intact. There is a mildly impacted fracture of the left femoral neck. The femoral head and intertrochanteric segment remain intact. Left femoral shaft and distal femur are intact. Grossly normal alignment at  the left knee. IMPRESSION: 1. Mildly impacted left femoral neck fracture. 2. No other acute fracture or dislocation identified about the left femur. Electronically Signed   By: Genevie Ann M.D.   On: 10/18/2018 22:55    Pending Labs Unresulted  Labs (From admission, onward)    Start     Ordered   Signed and Occupational hygienist morning,   R     Signed and Held   Signed and Held  CBC  Tomorrow morning,   R     Signed and Held          Vitals/Pain Today's Vitals   10/18/18 2129 10/18/18 2130  BP: (!) 101/58   Pulse: 63   Resp: 18   Temp: 98 F (36.7 C)   SpO2: 100%   Weight:  84.4 kg  Height:  5\' 10"  (1.778 m)  PainSc:  6     Isolation Precautions No active isolations  Medications Medications  sodium chloride 0.9 % bolus 1,000 mL (1,000 mLs Intravenous New Bag/Given 10/18/18 2346)  morphine 4 MG/ML injection 4 mg (4 mg Intravenous Given 10/18/18 2344)  ondansetron (ZOFRAN) injection 4 mg (4 mg Intravenous Given 10/18/18 2344)    Mobility walks with person assist Moderate fall risk   Focused Assessments ortho   R Recommendations: See Admitting Provider Note  Report given to:   Additional Notes:  [facility]

## 2018-10-19 NOTE — Op Note (Signed)
10/19/2018  5:11 PM  PATIENT:  Ruben Clark  63 y.o. male  PRE-OPERATIVE DIAGNOSIS:  left femoral neck fracture  POST-OPERATIVE DIAGNOSIS:  left femoral neck fracture  PROCEDURE:  Procedure(s): CANNULATED HIP PINNING (Left)  SURGEON: Laurene Footman, MD  ASSISTANTS: none  ANESTHESIA:   general  EBL:  Total I/O In: 250 [I.V.:250] Out: -   BLOOD ADMINISTERED:none  DRAINS: none   LOCAL MEDICATIONS USED:  NONE  SPECIMEN:  No Specimen  DISPOSITION OF SPECIMEN:  N/A  COUNTS:  YES  TOURNIQUET:  * No tourniquets in log *  IMPLANTS: Synthes 7.3 cannulated screw x4  DICTATION: .Dragon Dictation patient was brought to the operating room and after adequate general anesthesia was obtained he was placed on the fracture table with the right leg in the well leg holder in the left foot in the traction boot with no traction applied to slight internal rotation.  C arm was brought in and good visualization in both projections AP and lateral with no displacement from his admitting x-ray.  The back was then prepped and draped using the barrier drape method and appropriate patient identification and timeout procedures were completed.  Approximately 3 cm incision was made and the muscle split with a hemostat to get down to the lateral cortex at that point 4 guidewires were inserted and parallel fashion measured drilled tapped and then long threaded cannulated screws placed with no penetration into the joint.  8 permanent AP and lateral images were obtained and K wires were then removed the wound irrigated with 2-0 Vicryl closing the deep fascia and subcutaneous tissue with skin staples and honeycomb dressing applied.  PLAN OF CARE: Continue as inpatient  PATIENT DISPOSITION:  PACU - hemodynamically stable.

## 2018-10-19 NOTE — H&P (Signed)
[facility] [place] NAME: Ruben Clark    MR#:  [id number]  DATE OF BIRTH:  29-Dec-1955  DATE OF ADMISSION:  10/18/2018  PRIMARY CARE PHYSICIAN: System, Pcp Not In   REQUESTING/REFERRING PHYSICIAN: Cuthriell, PA  CHIEF COMPLAINT:   Chief Complaint  Patient presents with  . Fall    HISTORY OF PRESENT ILLNESS:  Ruben Clark  is a 62 y.o. male who presents with chief complaint as above.  Patient presents the ED after mechanical fall at home.  Imaging here shows left hip fracture.  Orthopedic surgery contacted and recommends admission for likely surgical intervention.  Hospitalist called for the same  PAST MEDICAL HISTORY:   Past Medical History:  Diagnosis Date  . CHF (congestive heart failure) ([place])   . Hypertension      PAST SURGICAL HISTORY:   Past Surgical History:  Procedure Laterality Date  . AMPUTATION Left 08/03/2018   Procedure: AMPUTATION RAY SECOND TOE;  Surgeon: Sharlotte Alamo, DPM;  Location: [facility] ORS;  Service: Podiatry;  Laterality: Left;  . IRRIGATION AND DEBRIDEMENT FOOT Left 08/05/2018   Procedure: IRRIGATION AND DEBRIDEMENT FOOT;  Surgeon: Sharlotte Alamo, DPM;  Location: [facility] ORS;  Service: Podiatry;  Laterality: Left;  . TOE AMPUTATION       SOCIAL HISTORY:   Social History   Tobacco Use  . Smoking status: Current Every Day Smoker  . Smokeless tobacco: Never Used  Substance Use Topics  . Alcohol use: Never    Frequency: Never     FAMILY HISTORY:    Family history reviewed and is non-contributory DRUG ALLERGIES:  No Known Allergies  MEDICATIONS AT HOME:   Prior to Admission medications   Medication Sig Start Date End Date Taking? Authorizing Provider  apixaban (ELIQUIS) 5 MG TABS tablet Take 1 tablet (5 mg total) by mouth 2 (two) times daily. 08/09/18  Yes Dustin Flock, MD  aspirin EC 81 MG tablet Take 81 mg by mouth daily.   Yes [provider]  digoxin (LANOXIN) 0.25 MG tablet Take  0.25 mg by mouth daily.   Yes [provider]  docusate sodium (COLACE) 100 MG capsule Take 100 mg by mouth 2 (two) times daily.   Yes [provider]  finasteride (PROSCAR) 5 MG tablet Take 1 tablet (5 mg total) by mouth daily. 08/09/18  Yes Dustin Flock, MD  furosemide (LASIX) 40 MG tablet Take 0.5 tablets (20 mg total) by mouth daily. 08/09/18  Yes Dustin Flock, MD  gabapentin (NEURONTIN) 300 MG capsule Take 300 mg by mouth 2 (two) times daily.    Yes [provider]  hydrOXYzine (ATARAX/VISTARIL) 25 MG tablet Take 25 mg by mouth every 8 (eight) hours as needed for itching.    Yes [provider]  lisinopril (ZESTRIL) 2.5 MG tablet Take 2.5 mg by mouth daily.   Yes [provider]  loratadine (CLARITIN) 10 MG tablet Take 10 mg by mouth daily.   Yes [provider]  LORazepam (ATIVAN) 0.5 MG tablet Take 0.5 tablets (0.25 mg total) by mouth daily. Patient taking differently: Take 0.5 mg by mouth daily.  08/09/18  Yes Dustin Flock, MD  metoprolol succinate (TOPROL-XL) 25 MG 24 hr tablet Take 0.5 tablets (12.5 mg total) by mouth daily. 10/10/18 11/09/18 Yes Pyreddy, Reatha Harps, MD    REVIEW OF SYSTEMS:  Review of Systems  Constitutional: Negative for chills, fever, malaise/fatigue and weight loss.  HENT: Negative for ear pain, hearing loss and tinnitus.  Eyes: Negative for blurred vision, double vision, pain and redness.  Respiratory: Negative for cough, hemoptysis and shortness of breath.   Cardiovascular: Negative for chest pain, palpitations, orthopnea and leg swelling.  Gastrointestinal: Negative for abdominal pain, constipation, diarrhea, nausea and vomiting.  Genitourinary: Negative for dysuria, frequency and hematuria.  Musculoskeletal: Positive for falls and joint pain (left hip). Negative for back pain and neck pain.  Skin:       No acne, rash, or lesions  Neurological: Negative for dizziness, tremors, focal weakness and weakness.   Endo/Heme/Allergies: Negative for polydipsia. Does not bruise/bleed easily.  Psychiatric/Behavioral: Negative for depression. The patient is not nervous/anxious and does not have insomnia.      VITAL SIGNS:   Vitals:   10/18/18 2129 10/18/18 2130  BP: (!) 101/58   Pulse: 63   Resp: 18   Temp: 98 F (36.7 C)   SpO2: 100%   Weight:  84.4 kg  Height:  5\' 10"  (1.778 m)   Wt Readings from Last 3 Encounters:  10/18/18 84.4 kg  10/06/18 72.6 kg  08/26/18 88.5 kg    PHYSICAL EXAMINATION:  Physical Exam  Vitals reviewed. Constitutional: He is oriented to person, place, and time. He appears well-developed and well-nourished. No distress.  HENT:  Head: Normocephalic and atraumatic.  Mouth/Throat: Oropharynx is clear and moist.  Eyes: Pupils are equal, round, and reactive to light. Conjunctivae and EOM are normal. No scleral icterus.  Neck: Normal range of motion. Neck supple. No JVD present. No thyromegaly present.  Cardiovascular: Normal rate, regular rhythm and intact distal pulses. Exam reveals no gallop and no friction rub.  No murmur heard. Respiratory: Effort normal and breath sounds normal. No respiratory distress. He has no wheezes. He has no rales.  GI: Soft. Bowel sounds are normal. He exhibits no distension. There is no abdominal tenderness.  Musculoskeletal: Normal range of motion.        General: Tenderness (left hip) present. No edema.     Comments: No arthritis, no gout  Lymphadenopathy:    He has no cervical adenopathy.  Neurological: He is alert and oriented to person, place, and time. No cranial nerve deficit.  No dysarthria, no aphasia  Skin: Skin is warm and dry. No rash noted. No erythema.  Psychiatric: He has a normal mood and affect. His behavior is normal. Judgment and thought content normal.    LABORATORY PANEL:   CBC Recent Labs  Lab 10/18/18 2346  WBC 21.3*  HGB 12.5*  HCT 39.2  PLT 403*    ------------------------------------------------------------------------------------------------------------------  Chemistries  Recent Labs  Lab 10/18/18 2346  NA 136  K 5.4*  CL 104  CO2 24  GLUCOSE 126*  BUN 49*  CREATININE 1.87*  CALCIUM 11.4*  AST 28  ALT 30  ALKPHOS 66  BILITOT 0.6   ------------------------------------------------------------------------------------------------------------------  Cardiac Enzymes No results for input(s): TROPONINI in the last 168 hours. ------------------------------------------------------------------------------------------------------------------  RADIOLOGY:  Dg Chest 1 View  Result Date: 10/19/2018 CLINICAL DATA:  63 y/o  M; status post fall. EXAM: CHEST  1 VIEW COMPARISON:  10/07/2018 chest radiograph FINDINGS: Stable heart size and mediastinal contours are within normal limits given projection and technique. Both lungs are clear. The visualized skeletal structures are unremarkable. IMPRESSION: No active disease. Electronically Signed   By: Kristine Garbe M.D.   On: 10/19/2018 00:19   Dg Lumbar Spine 2-3 Views  Result Date: 10/18/2018 CLINICAL DATA:  62 year old male status post fall with pain radiating to the left hip. EXAM:  LUMBAR SPINE - 2-3 VIEW COMPARISON:  None. FINDINGS: Normal lumbar segmentation. Bulky endplate spurring in the lower thoracic and lumbar spine. Spina bifida occulta at L5 (normal variant). Preserved vertebral height and alignment. Relatively preserved disc spaces. Sacral ala appear within normal limits. No acute osseous abnormality identified. Calcified aortic atherosclerosis. Negative abdominal visceral contours. IMPRESSION: 1. No acute osseous abnormality identified in the lumbar spine. 2. Aortic Atherosclerosis (ICD10-I70.0). Electronically Signed   By: Genevie Ann M.D.   On: 10/18/2018 23:01   Dg Pelvis 1-2 Views  Result Date: 10/18/2018 CLINICAL DATA:  63 year old male with left hip pain after fall.  EXAM: PELVIS - 1-2 VIEW COMPARISON:  Left femur series today. FINDINGS: Asymmetry of the proximal left femur compatible with left femoral neck fracture, see comparison. Both femoral heads are normally located. Hip joint spaces appear symmetric. Grossly intact proximal right femur. No pelvis fracture identified. SI joints appear within normal limits. Negative visible lower abdominal and pelvic visceral contours. IMPRESSION: 1. Asymmetry of the proximal left femur compatible with left femoral neck fracture, see comparison. 2. No other acute fracture or dislocation identified about the pelvis. Electronically Signed   By: Genevie Ann M.D.   On: 10/18/2018 22:58   Dg Femur Min 2 Views Left  Result Date: 10/18/2018 CLINICAL DATA:  63 year old male status post fall with left hip and thigh pain. EXAM: LEFT FEMUR 2 VIEWS COMPARISON:  None. FINDINGS: Left femoral head is normally located. Visible left pelvis appears intact. There is a mildly impacted fracture of the left femoral neck. The femoral head and intertrochanteric segment remain intact. Left femoral shaft and distal femur are intact. Grossly normal alignment at the left knee. IMPRESSION: 1. Mildly impacted left femoral neck fracture. 2. No other acute fracture or dislocation identified about the left femur. Electronically Signed   By: Genevie Ann M.D.   On: 10/18/2018 22:55    EKG:   Orders placed or performed during the hospital encounter of 10/07/18  . EKG 12-Lead  . EKG 12-Lead  . EKG 12-Lead  . EKG 12-Lead  . EKG 12-Lead  . EKG 12-Lead  . EKG 12-Lead  . EKG 12-Lead  . EKG 12-Lead  . EKG 12-Lead    IMPRESSION AND PLAN:  Principal Problem:   Closed left hip fracture (HCC) -PRN analgesia.  Orthopedic surgery consult Active Problems:   Paroxysmal atrial fibrillation (HCC) -home dose rate controlling medications.  Hold anticoagulation in anticipation of surgical repair of his hip fracture.   Chronic systolic CHF (congestive heart failure) (HCC)  -continue home meds   HTN (hypertension) -home dose antihypertensives  Chart review performed and case discussed with ED provider. Labs, imaging and/or ECG reviewed by provider and discussed with patient/family. Management plans discussed with the patient and/or family.  COVID-19 status: Pending  DVT PROPHYLAXIS: Mechanical only  GI PROPHYLAXIS:  None  ADMISSION STATUS: Inpatient     CODE STATUS: Full Code Status History    Date Active Date Inactive Code Status Order ID Comments User Context   10/07/2018 0442 10/11/2018 1817 Full Code [id number]  Mayer Camel, NP ED   08/02/2018 1502 08/09/2018 1743 Full Code [id number]  Mayo, Pete Pelt, MD Inpatient   Advance Care Planning Activity      TOTAL TIME TAKING CARE OF THIS PATIENT: 45 minutes.   This patient was evaluated in the context of the global COVID-19 pandemic, which necessitated consideration that the patient might be at risk for infection with the SARS-CoV-2 virus that causes COVID-19. Institutional  protocols and algorithms that pertain to the evaluation of patients at risk for COVID-19 are in a state of rapid change based on information released by regulatory bodies including the CDC and federal and state organizations. These policies and algorithms were followed to the best of this provider's knowledge to date during the patient's care at this facility.  Ethlyn Daniels 10/19/2018, 12:54 AM  [facility]  Office  [phone]  CC: Primary care physician; System, Pcp Not In  Note:  This document was prepared using Dragon voice recognition software and may include unintentional dictation errors.

## 2018-10-19 NOTE — Progress Notes (Signed)
See the patient bedside, Reviewed vital signs alive.  Physical examination done. Continue current treatment.  Follow-up with orthopedic surgeon for hip surgery today. Tobacco abuse.  Smoking cessation was counseled for 3 to 4 minutes.  Nicotine patch. I discussed with the patient and RN.  Time spent about 20 minutes.

## 2018-10-19 NOTE — NC FL2 (Addendum)
[place] LEVEL OF CARE SCREENING TOOL     IDENTIFICATION  Patient Name: Ruben Clark Birthdate: 02-15-1956 Sex: male Admission Date (Current Location): 10/18/2018  [place] and [place] Number:  Engineering geologist and Address:  [facility], [address], [place], [place] [address]      Provider Number: [id number]  Attending Physician Name and Address:  Demetrios Loll, MD  Relative Name and Phone Number:  Margit Banda [phone]    Current Level of Care: Hospital Recommended Level of Care: [place] Prior Approval Number:    Date Approved/Denied:   PASRR Number: [id number]  Discharge Plan: SNF    Current Diagnoses: Patient Active Problem List   Diagnosis Date Noted  . Closed left hip fracture ([place]) 10/19/2018  . Chronic systolic CHF (congestive heart failure) ([place]) 10/19/2018  . HTN (hypertension) 10/19/2018  . Sepsis ([place]) 10/07/2018  . COPD (chronic obstructive pulmonary disease) ([place]) 08/26/2018  . Atrial fibrillation ([place]) 08/26/2018  . Left foot infection 08/02/2018    Orientation RESPIRATION BLADDER Height & Weight     Self, Time, Situation, Place  Normal Incontinent Weight: 84.4 kg Height:  5\' 10"  (177.8 cm)  BEHAVIORAL SYMPTOMS/MOOD NEUROLOGICAL BOWEL NUTRITION STATUS      Incontinent Diet  AMBULATORY STATUS COMMUNICATION OF NEEDS Skin   Extensive Assist Verbally Normal, Surgical wounds                       Personal Care Assistance Level of Assistance  Bathing, Feeding, Dressing Bathing Assistance: Limited assistance Feeding assistance: Limited assistance Dressing Assistance: Limited assistance     Functional Limitations Info  Sight, Hearing, Speech Sight Info: Adequate Hearing Info: Adequate Speech Info: Adequate    SPECIAL CARE FACTORS FREQUENCY  PT (By licensed PT)                    Contractures Contractures Info: Not present    Additional Factors Info  Code  Status, Allergies Code Status Info: full code Allergies Info: NKDA           Current Medications (10/19/2018):  This is the current hospital active medication list Current Facility-Administered Medications  Medication Dose Route Frequency Provider Last Rate Last Dose  . acetaminophen (TYLENOL) tablet 650 mg  650 mg Oral Q6H PRN Lance Coon, MD       Or  . acetaminophen (TYLENOL) suppository 650 mg  650 mg Rectal Q6H PRN Lance Coon, MD      . bisacodyl (DULCOLAX) EC tablet 5 mg  5 mg Oral Daily PRN Demetrios Loll, MD      . ceFAZolin (ANCEF) IVPB 1 g/50 mL premix  1 g Intravenous Once Hessie Knows, MD      . Chlorhexidine Gluconate Cloth 2 % PADS 6 each  6 each Topical Q0600 Lance Coon, MD   6 each at 10/19/18 [phone]  . morphine 4 MG/ML injection 4 mg  4 mg Intravenous Q4H PRN Lance Coon, MD      . mupirocin ointment (BACTROBAN) 2 % 1 application  1 application Nasal BID Lance Coon, MD   1 application at 27/07/86 1139  . ondansetron (ZOFRAN) tablet 4 mg  4 mg Oral Q6H PRN Lance Coon, MD       Or  . ondansetron [facility]) injection 4 mg  4 mg Intravenous Q6H PRN Lance Coon, MD      . oxyCODONE (Oxy IR/ROXICODONE) immediate release tablet 5 mg  5 mg Oral  Q4H PRN Lance Coon, MD      . [facility] Hawks [facility]) tablet 8.6 mg  1 tablet Oral Daily PRN Demetrios Loll, MD         Discharge Medications: Please see discharge summary for a list of discharge medications.  Relevant Imaging Results:  Relevant Lab Results:   Additional Information [id number]  Su Hilt, RN

## 2018-10-19 NOTE — Anesthesia Preprocedure Evaluation (Addendum)
Anesthesia Evaluation  Patient identified by MRN, date of birth, ID band Patient awake    Reviewed: Allergy & Precautions, H&P , NPO status , Patient's Chart, lab work & pertinent test results  Airway Mallampati: III  TM Distance: >3 FB Neck ROM: full    Dental  (+) Poor Dentition, Chipped Multiple broken teeth:   Pulmonary COPD,  COPD inhaler, Current Smoker,           Cardiovascular hypertension, +CHF  + dysrhythmias Atrial Fibrillation   Echo 2019: There is mild concentric left ventricular hypertrophy.  The overall left ventricular systolic function is estimated at 25%.  Right ventricle is normal in size with reduced systolic function.  The left atrium is severely dilated.  The right atrium is dilated. Bicuspid aortic valve with mild aortic stenosis  Afib on chronic anticoagulation   Neuro/Psych negative neurological ROS  negative psych ROS   GI/Hepatic negative GI ROS, Neg liver ROS,   Endo/Other  negative endocrine ROS  Renal/GU      Musculoskeletal   Abdominal   Peds  Hematology  (+) Blood dyscrasia, anemia , Hgb 10.7   Anesthesia Other Findings Past Medical History: No date: CHF (congestive heart failure) ([place]) No date: Hypertension  Past Surgical History: 08/03/2018: AMPUTATION; Left     Comment:  Procedure: AMPUTATION RAY SECOND TOE;  Surgeon: Sharlotte Alamo, DPM;  Location: [facility] ORS;  Service: Podiatry;                Laterality: Left; 08/05/2018: IRRIGATION AND DEBRIDEMENT FOOT; Left     Comment:  Procedure: IRRIGATION AND DEBRIDEMENT FOOT;  Surgeon:               Sharlotte Alamo, DPM;  Location: [facility] ORS;  Service:               Podiatry;  Laterality: Left; No date: TOE AMPUTATION  BMI    Body Mass Index: 26.69 kg/m      Reproductive/Obstetrics negative OB ROS                          Anesthesia Physical Anesthesia Plan  ASA: III  Anesthesia Plan: General  ETT   Post-op Pain Management:    Induction:   PONV Risk Score and Plan: Ondansetron, Dexamethasone and Treatment may vary due to age or medical condition  Airway Management Planned:   Additional Equipment:   Intra-op Plan:   Post-operative Plan:   Informed Consent: I have reviewed the patients History and Physical, chart, labs and discussed the procedure including the risks, benefits and alternatives for the proposed anesthesia with the patient or authorized representative who has indicated his/her understanding and acceptance.     Dental Advisory Given  Plan Discussed with: Anesthesiologist and CRNA  Anesthesia Plan Comments: (Afib on chronic anticoagulation, last dose Eliquis was yesterday.  Will proceed with GETA)      Anesthesia Quick Evaluation

## 2018-10-19 NOTE — Progress Notes (Signed)
15 minute call to floor. 

## 2018-10-19 NOTE — TOC Progression Note (Deleted)
Transition of Care [facility]) - Progression Note    Patient Details  Name: Ruben Clark MRN: [id number] Date of Birth: 1955-10-02  Transition of Care [facility]) CM/SW Contact  Su Hilt, RN Phone Number: 10/19/2018, 9:13 AM  Clinical Narrative:     Leonarda Salon at [phone] and left a vm for Ed Weeks requested a call back.       Expected Discharge Plan and Services                                                 Social Determinants of Health (SDOH) Interventions    Readmission Risk Interventions Readmission Risk Prevention Plan 10/10/2018  Post Dischage Appt Complete  Medication Screening Complete  Transportation Screening Complete

## 2018-10-19 NOTE — Transfer of Care (Signed)
Immediate Anesthesia Transfer of Care Note  Patient: Ruben Clark  Procedure(s) Performed: CANNULATED HIP PINNING (Left Hip)  Patient Location: PACU  Anesthesia Type:General  Level of Consciousness: sedated  Airway & Oxygen Therapy: Patient Spontanous Breathing and Patient connected to face mask oxygen  Post-op Assessment: Report given to RN and Post -op Vital signs reviewed and stable  Post vital signs: Reviewed and stable  Last Vitals:  Vitals Value Taken Time  BP    Temp    Pulse    Resp 13 10/19/18 1700  SpO2    Vitals shown include unvalidated device data.  Last Pain:  Vitals:   10/19/18 1440  TempSrc: Oral  PainSc:          Complications: No apparent anesthesia complications

## 2018-10-19 NOTE — Progress Notes (Signed)
Advanced Care Plan.  Purpose of Encounter: CODE STATUS. Parties in Attendance: The patient and me. Patient's Decisional Capacity: Yes. Medical Story: Ruben Clark  is a 63 y.o. male with history of hypertension, CKD, A. fib and CHF.  He is admitted for left hip fracture.  I discussed with patient about his current condition, prognosis and CODE STATUS.  The patient does want to be resuscitated and intubated if he has a cardiopulmonary arrest. Plan:  Code Status: Full code. Time spent discussing advance care planning: 17 minutes.

## 2018-10-19 NOTE — Consult Note (Signed)
Reason for consult is left femoral neck fracture. History patient suffered a fall at home.  He did not have any loss of consciousness.  He was unable to walk after and so was brought to the emergency room and found to have an impacted femoral neck fracture.  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  He has a history of congestive heart failure and is on Eliquis.  On physical exam there is no shortening or external rotation of the leg he has minimal pain with logrolling.  He is able to flex extend the remaining toes on the left foot and has intact sensation to the plantar and dorsal aspect of the foot.  Skin is intact around the hip.  Radiographs of the hip show impacted femoral neck fracture with minimal apex posterior angulation.  Minimal degenerative change.  Clinical impression is impacted femoral neck fracture.  Recommendation is for hip pinning.  We will get a hold of his legal guardian later this morning after checking with anesthesia to see if we can do his surgery today since he has been on Eliquis.  Need to discuss risks of failure fixation with subsequent displacement and requirement to convert to hemi-or total arthroplasty and the potential for avascular necrosis.

## 2018-10-20 LAB — CBC
HCT: 36.7 % — ABNORMAL LOW (ref 39.0–52.0)
Hemoglobin: 11.4 g/dL — ABNORMAL LOW (ref 13.0–17.0)
MCH: 29.7 pg (ref 26.0–34.0)
MCHC: 31.1 g/dL (ref 30.0–36.0)
MCV: 95.6 fL (ref 80.0–100.0)
Platelets: 310 10*3/uL (ref 150–400)
RBC: 3.84 MIL/uL — ABNORMAL LOW (ref 4.22–5.81)
RDW: 14.6 % (ref 11.5–15.5)
WBC: 13.7 10*3/uL — ABNORMAL HIGH (ref 4.0–10.5)
nRBC: 0 % (ref 0.0–0.2)

## 2018-10-20 LAB — BASIC METABOLIC PANEL
Anion gap: 5 (ref 5–15)
BUN: 40 mg/dL — ABNORMAL HIGH (ref 8–23)
CO2: 26 mmol/L (ref 22–32)
Calcium: 10.8 mg/dL — ABNORMAL HIGH (ref 8.9–10.3)
Chloride: 113 mmol/L — ABNORMAL HIGH (ref 98–111)
Creatinine, Ser: 1.84 mg/dL — ABNORMAL HIGH (ref 0.61–1.24)
GFR calc Af Amer: 45 mL/min — ABNORMAL LOW (ref >60)
GFR calc non Af Amer: 38 mL/min — ABNORMAL LOW (ref >60)
Glucose, Bld: 109 mg/dL — ABNORMAL HIGH (ref 70–99)
Potassium: 4.9 mmol/L (ref 3.5–5.1)
Sodium: 144 mmol/L (ref 135–145)

## 2018-10-20 NOTE — Progress Notes (Signed)
Physical Therapy Treatment Patient Details Name: Ruben Clark MRN: [id number] DOB: [birth year] Today's Date: 10/20/2018    History of Present Illness Patient is a 63 y/o M admitted after fall with left hip fracture. S/P hip pinning. WBAT.PMH to include falls, CHF, HTN, L hallux resection and left 2nd toe resection.    PT Comments    Patient reports he is ready to get back into bed. Patient requires max cues for safety with transfers and mobility. Requires +2 mod assist to perform sit to stand from recliner. +2 mod assist with heavy cues to walk 3 feet from recliner to bed. Improved weight bearing and weight shifting this pm. Patient will benefit from continued skilled PT to improve his strength, transfers, ambulation and functional independence.        Follow Up Recommendations  SNF     Equipment Recommendations  Rolling walker with 5" wheels    Recommendations for Other Services       Precautions / Restrictions Precautions Precautions: Fall Restrictions Weight Bearing Restrictions: Yes LLE Weight Bearing: Weight bearing as tolerated    Mobility  Bed Mobility Overal bed mobility: Needs Assistance Bed Mobility: Sit to Supine       Sit to supine: +2 for physical assistance;Mod assist   General bed mobility comments: requires assistance to safely get back into bed, to raise L LE.  Transfers Overall transfer level: Needs assistance Equipment used: Rolling walker (2 wheeled)   Sit to Stand: +2 physical assistance;Mod assist            Ambulation/Gait Ambulation/Gait assistance: Min assist;+2 physical assistance Gait Distance (Feet): 3 Feet Assistive device: Rolling walker (2 wheeled) Gait Pattern/deviations: Step-to pattern;Decreased step length - left;Decreased step length - right;Decreased stride length;Antalgic Gait velocity: decreased   General Gait Details: patient able to take a few steps from chair to bed this pm. improved ability to WB on L LE.   Stairs              Wheelchair Mobility    Modified Rankin (Stroke Patients Only)       Balance Overall balance assessment: History of Falls;Needs assistance Sitting-balance support: Feet supported Sitting balance-Leahy Scale: Good     Standing balance support: Bilateral upper extremity supported Standing balance-Leahy Scale: Fair                              Cognition Arousal/Alertness: Awake/alert Behavior During Therapy: WFL for tasks assessed/performed Overall Cognitive Status: No family/caregiver present to determine baseline cognitive functioning                                 General Comments: Pt with some confusion re: date, ~50-60% verbalized understanding of education provided.      Exercises Total Joint Exercises Ankle Circles/Pumps: AAROM;Left;10 reps Heel Slides: AAROM;Left;5 reps Hip ABduction/ADduction: AAROM;5 reps;Left Other Exercises Other Exercises: OT facilitates education re: AE for LB dressing including sock aid, reacher, and LH shoe horn Other Exercises: OT facilitates education re: general fall safety awareness for injury prevention including scanning environment for hazards, using pouch on FWW to carry devices.    General Comments        Pertinent Vitals/Pain Pain Assessment: 0-10 Pain Score: 8  Faces Pain Scale: Hurts even more Pain Location: L hip/operative area with pain when moving, however, pt reports 0/10 pain when seated in recliner with BLEs elevated. Pain  Descriptors / Indicators: Aching;Discomfort;Operative site guarding;Sore Pain Intervention(s): Limited activity within patient's tolerance;Monitored during session;Repositioned    Home Living Family/patient expects to be discharged to:: Skilled nursing facility                    Prior Function Level of Independence: Independent with assistive device(s)      Comments: Patient denies need for assistance with dressing, states there was a caregiver  assisting with bathing but "she quit", when asked about bathing ability, states he was able to wash the front side in a seated position, but that was all he could "get ahold of". Patient used RW prior to hospital admission but with several falls that he attributes to "tripping over my big feet". Patient has 3 meals provided at group home and does not need to clean/cook while there.   PT Goals (current goals can now be found in the care plan section) Acute Rehab PT Goals Patient Stated Goal: to go to rehab PT Goal Formulation: With patient Time For Goal Achievement: 10/23/18 Potential to Achieve Goals: Good Progress towards PT goals: Progressing toward goals    Frequency    BID      PT Plan Current plan remains appropriate    Co-evaluation              AM-PAC PT "6 Clicks" Mobility   Outcome Measure  Help needed turning from your back to your side while in a flat bed without using bedrails?: A Lot Help needed moving from lying on your back to sitting on the side of a flat bed without using bedrails?: A Lot Help needed moving to and from a bed to a chair (including a wheelchair)?: A Lot Help needed standing up from a chair using your arms (e.g., wheelchair or bedside chair)?: A Lot Help needed to walk in hospital room?: A Lot Help needed climbing 3-5 steps with a railing? : Total 6 Click Score: 11    End of Session Equipment Utilized During Treatment: Gait belt Activity Tolerance: Patient limited by pain Patient left: in bed;with bed alarm set;with call bell/phone within reach Nurse Communication: Mobility status PT Visit Diagnosis: Unsteadiness on feet (R26.81);Muscle weakness (generalized) (M62.81);Difficulty in walking, not elsewhere classified (R26.2);Pain;History of falling (Z91.81) Pain - Right/Left: Left Pain - part of body: Hip     Time: 1330-1350 PT Time Calculation (min) (ACUTE ONLY): 20 min  Charges:  $Gait Training: 8-22 mins                      Arlin Savona, PT, GCS 10/20/18,2:38 PM

## 2018-10-20 NOTE — Progress Notes (Signed)
   Subjective: 1 Day Post-Op Procedure(s) (LRB): CANNULATED HIP PINNING (Left) Patient reports pain as mild.   Patient is well, and has had no acute complaints or problems Denies any CP, SOB, ABD pain. We will continue therapy today.   Objective: Vital signs in last 24 hours: Temp:  [97.6 F (36.4 C)-98.4 F (36.9 C)] 97.8 F (36.6 C) (07/15 0753) Pulse Rate:  [61-78] 72 (07/15 0753) Resp:  [13-18] 16 (07/15 0753) BP: (114-149)/(53-84) 117/56 (07/15 0753) SpO2:  [89 %-100 %] 99 % (07/15 1001)  Intake/Output from previous day: 07/14 0701 - 07/15 0700 In: 1133.7 [I.V.:933.9; IV Piggyback:199.9] Out: 1000 [Urine:1000] Intake/Output this shift: Total I/O In: 208.3 [I.V.:208.3] Out: -   Recent Labs    10/18/18 2346 10/19/18 0555 10/20/18 0458  HGB 12.5* 10.7* 11.4*   Recent Labs    10/19/18 0555 10/20/18 0458  WBC 15.3* 13.7*  RBC 3.61* 3.84*  HCT 33.6* 36.7*  PLT 315 310   Recent Labs    10/19/18 0555 10/20/18 0458  NA 139 144  K 4.9 4.9  CL 109 113*  CO2 23 26  BUN 46* 40*  CREATININE 1.90* 1.84*  GLUCOSE 98 109*  CALCIUM 10.5* 10.8*   No results for input(s): LABPT, INR in the last 72 hours.  EXAM General - Patient is Alert, Appropriate and Oriented Extremity - Neurovascular intact Sensation intact distally Dorsiflexion/Plantar flexion intact No cellulitis present Compartment soft Dressing - dressing C/D/I and no drainage Motor Function - intact, moving foot and toes well on exam.   Past Medical History:  Diagnosis Date  . CHF (congestive heart failure) ([place])   . Hypertension     Assessment/Plan:   1 Day Post-Op Procedure(s) (LRB): CANNULATED HIP PINNING (Left) Principal Problem:   Closed left hip fracture (H[facility]) Active Problems:   Atrial fibrillation (HCC)   Chronic systolic CHF (congestive heart failure) (HCC)   HTN (hypertension)  Estimated body mass index is 26.69 kg/m as calculated from the following:   Height as of this  encounter: 5\' 10"  (1.778 m).   Weight as of this encounter: 84.4 kg. Advance diet Up with therapy  Needs BM Labs stable.  CM to assist with discharge  Follow up with [facility] in 2 weeks for staple removal TEDs x 6 weeks   DVT Prophylaxis - TED hose and Eliquis, SCDs Weight-Bearing as tolerated to Left leg   T. Rachelle Hora, PA-C [place] 10/20/2018, 11:58 AM

## 2018-10-20 NOTE — Evaluation (Signed)
Occupational Therapy Evaluation Patient Details Name: Ruben Clark MRN: [id number] DOB: 1955-08-08 Today's Date: 10/20/2018    History of Present Illness Patient is a 63 y/o M admitted after fall with left hip fracture. S/P hip pinning. WBAT.PMH to include falls, CHF, HTN, L hallux resection and left 2nd toe resection.   Clinical Impression   Pt seen for OT evaluation this date, POD#1 from above surgery. Pt was independent in dressing and toileting, had previously received assistance for bathing, but had most recently been performing while seated and endorses difficulty. Pt was using RW for fxl mobility d/t hip pain and endorses several falls citing tripping over his "big feet". Pt currently requires MAX A for LB dressing while in seated position due to pain and limited AROM of L hip. Pt instructed in falls prevention strategies, DME/AE for LB bathing and dressing tasks, and modified transfer techniques. Pt requires reinforcement of safety/fall prevention safety as well as follow up to address ADL transfers and LB ADL adaptations/modifications in setting of L hip pain. Recommend SNF at d/c for pt safety.     Follow Up Recommendations  SNF    Equipment Recommendations  3 in 1 bedside commode    Recommendations for Other Services       Precautions / Restrictions Precautions Precautions: Fall Restrictions Weight Bearing Restrictions: Yes LLE Weight Bearing: Weight bearing as tolerated      Mobility Bed Mobility  Transfers    Balance Overall balance assessment: History of Falls;Needs assistance Sitting-balance support: Feet supported;Bilateral upper extremity supported Sitting balance-Leahy Scale: Good     Standing balance support: Bilateral upper extremity supported;During functional activity Standing balance-Leahy Scale: Poor Standing balance comment: unable to get fully standing due to L LE pain                           ADL either performed or assessed with  clinical judgement   ADL Overall ADL's : Needs assistance/impaired Eating/Feeding: Independent   Grooming: Wash/dry hands;Wash/dry face;Set up   Upper Body Bathing: Set up Upper Body Bathing Details (indicate cue type and reason): based on clinical observation Lower Body Bathing: Moderate assistance;Maximal assistance;With adaptive equipment;Cueing for safety;Sitting/lateral leans Lower Body Bathing Details (indicate cue type and reason): based on clinical observation Upper Body Dressing : Set up   Lower Body Dressing: Maximal assistance;With adaptive equipment;Cueing for safety;Cueing for sequencing;Sitting/lateral leans     Toilet Transfer Details (indicate cue type and reason): pt had just transferred to chair with PT and states in too much pain to attempt SPS to [facility] during OT eval. Toileting- Clothing Manipulation and Hygiene: Maximal assistance     Tub/Shower Transfer Details (indicate cue type and reason): pt had just transferred to chair with PT and states in too much pain to attempt SPS during OT eval.   General ADL Comments: pt had just transferred to chair with PT and states in too much pain to attempt fxl mobility during OT eval.     Vision Baseline Vision/History: Wears glasses Wears Glasses: At all times Patient Visual Report: No change from baseline Vision Assessment?: No apparent visual deficits     Perception     Praxis      Pertinent Vitals/Pain Pain Assessment: Faces Faces Pain Scale: Hurts even more Pain Location: L hip/operative area with pain when moving, however, pt reports 0/10 pain when seated in recliner with BLEs elevated. Pain Descriptors / Indicators: Aching;Grimacing Pain Intervention(s): Limited activity within patient's tolerance;Monitored during session;Repositioned  Hand Dominance     Extremity/Trunk Assessment Upper Extremity Assessment Upper Extremity Assessment: Overall WFL for tasks assessed;RUE deficits/detail;LUE  deficits/detail RUE Deficits / Details: 4-/5 shoulder, elbow flex/ext and shld horizontal abd/add, 4-/5 hand grip LUE Deficits / Details: 4-/5 shoulder, elbow flex/ext and shld horizontal abd/add, 4-/5 hand grip   Lower Extremity Assessment Lower Extremity Assessment: Defer to PT evaluation;LLE deficits/detail LLE Sensation: WNL LLE Coordination: decreased fine motor;decreased gross motor   Cervical / Trunk Assessment Cervical / Trunk Assessment: Kyphotic   Communication Communication Communication: No difficulties   Cognition Arousal/Alertness: Awake/alert Behavior During Therapy: WFL for tasks assessed/performed Overall Cognitive Status: Within Functional Limits for tasks assessed                                 General Comments: Pt with some confusion re: date, ~50-60% verbalized understanding of education provided.   General Comments       Exercises Other Exercises Other Exercises: OT facilitates education re: AE for LB dressing including sock aid, reacher, and LH shoe horn Other Exercises: OT facilitates education re: general fall safety awareness for injury prevention including scanning environment for hazards, using pouch on FWW to carry devices.   Shoulder Instructions      Home Living Family/patient expects to be discharged to:: Skilled nursing facility                                        Prior Functioning/Environment Level of Independence: Independent with assistive device(s)        Comments: Patient denies need for assistance with dressing, states there was a caregiver assisting with bathing but "she quit", when asked about bathing ability, states he was able to wash the front side in a seated position, but that was all he could "get ahold of". Patient used RW prior to hospital admission but with several falls that he attributes to "tripping over my big feet". Patient has 3 meals provided at group home and does not need to  clean/cook while there.        OT Problem List: Decreased strength;Decreased range of motion;Decreased activity tolerance;Impaired balance (sitting and/or standing);Decreased cognition;Decreased safety awareness;Decreased knowledge of use of DME or AE;Decreased knowledge of precautions;Pain      OT Treatment/Interventions: Self-care/ADL training;Therapeutic exercise;DME and/or AE instruction;Therapeutic activities;Patient/family education    OT Goals(Current goals can be found in the care plan section) Acute Rehab OT Goals Patient Stated Goal: "to get straightened out" OT Goal Formulation: With patient Time For Goal Achievement: 11/03/18 Potential to Achieve Goals: Good  OT Frequency: Min 1X/week   Barriers to D/C:            Co-evaluation              AM-PAC OT "6 Clicks" Daily Activity     Outcome Measure Help from another person eating meals?: None Help from another person taking care of personal grooming?: A Little Help from another person toileting, which includes using toliet, bedpan, or urinal?: A Lot Help from another person bathing (including washing, rinsing, drying)?: A Lot Help from another person to put on and taking off regular upper body clothing?: None Help from another person to put on and taking off regular lower body clothing?: A Lot 6 Click Score: 17   End of Session    Activity Tolerance: Patient tolerated  treatment well;Patient limited by pain Patient left: in chair;with chair alarm set  OT Visit Diagnosis: Unsteadiness on feet (R26.81);Muscle weakness (generalized) (M62.81);History of falling (Z91.81)                Time: 4403-4742 OT Time Calculation (min): 48 min Charges:  OT Evaluation $OT Eval Moderate Complexity: 1 Mod OT Treatments $Self Care/Home Management : 23-37 mins  Gerrianne Scale, MS, OTR/L ascom [phone] or [phone] 10/20/18, 11:48 AM

## 2018-10-20 NOTE — Evaluation (Signed)
Physical Therapy Evaluation Patient Details Name: Ruben Clark MRN: [id number] DOB: 06/10/1955 Today's Date: 10/20/2018   History of Present Illness  Patient is a 63 year old maleadmitted after fall with left hip fracture. S/P hip pinning. WBAT.PMH to include falls, CHF, HTN, L hallux resection and left 2nd toe resection.    Clinical Impression  Patient received in bed finishing breakfast. Agrees to work with PT. Reports no pain at rest. Patient requires mod assist with supine to sit to move LLE on the bed and to raise trunk into sitting position. Once seated height of bed elevated and patient attempted to stand but was unable due to pain and returned to sitting. Patient made several attempts to stand, then finally was assisted via squat pivot transfer to recliner. Mod assist. Patient will benefit from skilled PT to address his weakness, improve his functional independence, and return home.       Follow Up Recommendations SNF    Equipment Recommendations  Rolling walker with 5" wheels    Recommendations for Other Services       Precautions / Restrictions Precautions Precautions: Fall Restrictions Weight Bearing Restrictions: Yes LLE Weight Bearing: Weight bearing as tolerated      Mobility  Bed Mobility Overal bed mobility: Needs Assistance Bed Mobility: Supine to Sit     Supine to sit: Mod assist     General bed mobility comments: assist to move L LE, assist to raise trunk to seated position  Transfers Overall transfer level: Needs assistance Equipment used: Rolling walker (2 wheeled) Transfers: Sit to/from [employer] Sit to Stand: Mod assist   Squat pivot transfers: Mod assist     General transfer comment: patient was able to perform partial stand, but sat back down, then after several attempts pivoted over to recliner  Ambulation/Gait             General Gait Details: unable to ambulate at this time  Stairs            Wheelchair  Mobility    Modified Rankin (Stroke Patients Only)       Balance Overall balance assessment: History of Falls;Needs assistance Sitting-balance support: Feet supported;Bilateral upper extremity supported Sitting balance-Leahy Scale: Good     Standing balance support: Bilateral upper extremity supported;During functional activity Standing balance-Leahy Scale: Poor Standing balance comment: unable to get fully standing due to L LE pain                             Pertinent Vitals/Pain Pain Assessment: Faces Faces Pain Scale: Hurts whole lot Pain Descriptors / Indicators: Aching;Discomfort;Operative site guarding;Sore Pain Intervention(s): Limited activity within patient's tolerance;Monitored during session;Repositioned;Ice applied    Home Living Family/patient expects to be discharged to:: Skilled nursing facility                      Prior Function Level of Independence: Independent with assistive device(s)(although history of falls)         Comments: Patient denies need for assistance with bathing/dressing. Patient used RW prior to hospital admission. Patient has 3 meals provided at group home and does not need to clean/cook while there.     Hand Dominance        Extremity/Trunk Assessment   Upper Extremity Assessment Upper Extremity Assessment: Generalized weakness    Lower Extremity Assessment Lower Extremity Assessment: LLE deficits/detail LLE Sensation: WNL LLE Coordination: decreased fine motor;decreased gross motor  Communication   Communication: No difficulties  Cognition Arousal/Alertness: Awake/alert Behavior During Therapy: WFL for tasks assessed/performed Overall Cognitive Status: Within Functional Limits for tasks assessed                                        General Comments      Exercises     Assessment/Plan    PT Assessment Patient needs continued PT services  PT Problem List Decreased  strength;Decreased mobility;Decreased safety awareness;Decreased activity tolerance;Decreased coordination;Decreased knowledge of precautions;Decreased balance;Decreased knowledge of use of DME;Pain       PT Treatment Interventions DME instruction;Therapeutic activities;Gait training;Therapeutic exercise;Patient/family education;Stair training;Balance training;Functional mobility training;Neuromuscular re-education    PT Goals (Current goals can be found in the Care Plan section)  Acute Rehab PT Goals Patient Stated Goal: to go to rehab PT Goal Formulation: With patient Time For Goal Achievement: 10/23/18 Potential to Achieve Goals: Good    Frequency BID   Barriers to discharge Decreased caregiver support      Co-evaluation               AM-PAC PT "6 Clicks" Mobility  Outcome Measure Help needed turning from your back to your side while in a flat bed without using bedrails?: A Lot Help needed moving from lying on your back to sitting on the side of a flat bed without using bedrails?: A Lot Help needed moving to and from a bed to a chair (including a wheelchair)?: A Lot Help needed standing up from a chair using your arms (e.g., wheelchair or bedside chair)?: A Lot Help needed to walk in hospital room?: Total Help needed climbing 3-5 steps with a railing? : Total 6 Click Score: 10    End of Session Equipment Utilized During Treatment: Gait belt Activity Tolerance: Patient limited by pain Patient left: in chair;with chair alarm set;with call bell/phone within reach Nurse Communication: Mobility status PT Visit Diagnosis: Muscle weakness (generalized) (M62.81);Difficulty in walking, not elsewhere classified (R26.2);Pain;History of falling (Z91.81) Pain - Right/Left: Left Pain - part of body: Hip    Time: 0315-9458 PT Time Calculation (min) (ACUTE ONLY): 28 min   Charges:   PT Evaluation $PT Eval Moderate Complexity: 1 Mod PT Treatments $Therapeutic Activity: 8-22  mins        Joory Gough, PT, GCS 10/20/18,10:13 AM

## 2018-10-20 NOTE — Progress Notes (Signed)
[place] at [place] NAME: Ruben Clark    MR#:  [id number]  DATE OF BIRTH:  09/08/1955  SUBJECTIVE:  CHIEF COMPLAINT:   Chief Complaint  Patient presents with  . Fall   Left leg pain. REVIEW OF SYSTEMS:  Review of Systems  Constitutional: Negative for chills, fever and malaise/fatigue.  HENT: Negative for sore throat.   Eyes: Negative for blurred vision and double vision.  Respiratory: Negative for cough, hemoptysis, shortness of breath, wheezing and stridor.   Cardiovascular: Negative for chest pain, palpitations, orthopnea and leg swelling.  Gastrointestinal: Negative for abdominal pain, blood in stool, diarrhea, melena, nausea and vomiting.  Genitourinary: Negative for dysuria, flank pain and hematuria.  Musculoskeletal: Positive for joint pain. Negative for back pain.  Skin: Negative for rash.  Neurological: Negative for dizziness, sensory change, focal weakness, seizures, loss of consciousness, weakness and headaches.  Endo/Heme/Allergies: Negative for polydipsia.  Psychiatric/Behavioral: Negative for depression. The patient is not nervous/anxious.     DRUG ALLERGIES:  No Known Allergies VITALS:  Blood pressure (!) 117/56, pulse 72, temperature 97.8 F (36.6 C), temperature source Oral, resp. rate 16, height 5\' 10"  (1.778 m), weight 84.4 kg, SpO2 99 %. PHYSICAL EXAMINATION:  Physical Exam Constitutional:      General: He is not in acute distress. HENT:     Head: Normocephalic.     Mouth/Throat:     Mouth: Mucous membranes are moist.  Eyes:     General: No scleral icterus.    Conjunctiva/sclera: Conjunctivae normal.     Pupils: Pupils are equal, round, and reactive to light.  Neck:     Musculoskeletal: Normal range of motion and neck supple.     Vascular: No JVD.     Trachea: No tracheal deviation.  Cardiovascular:     Rate and Rhythm: Normal rate and regular rhythm.     Heart sounds: Normal heart sounds. No murmur.  No gallop.   Pulmonary:     Effort: Pulmonary effort is normal. No respiratory distress.     Breath sounds: Normal breath sounds. No wheezing or rales.  Abdominal:     General: Bowel sounds are normal. There is no distension.     Palpations: Abdomen is soft.     Tenderness: There is no abdominal tenderness. There is no rebound.  Musculoskeletal: Normal range of motion.        General: No tenderness.     Right lower leg: No edema.     Left lower leg: No edema.  Skin:    Findings: No erythema or rash.  Neurological:     General: No focal deficit present.     Mental Status: He is alert and oriented to person, place, and time.     Cranial Nerves: No cranial nerve deficit.  Psychiatric:        Mood and Affect: Mood normal.    LABORATORY PANEL:  Male CBC Recent Labs  Lab 10/20/18 0458  WBC 13.7*  HGB 11.4*  HCT 36.7*  PLT 310   ------------------------------------------------------------------------------------------------------------------ Chemistries  Recent Labs  Lab 10/18/18 2346  10/20/18 0458  NA 136   < > 144  K 5.4*   < > 4.9  CL 104   < > 113*  CO2 24   < > 26  GLUCOSE 126*   < > 109*  BUN 49*   < > 40*  CREATININE 1.87*   < > 1.84*  CALCIUM 11.4*   < >  10.8*  AST 28  --   --   ALT 30  --   --   ALKPHOS 66  --   --   BILITOT 0.6  --   --    < > = values in this interval not displayed.   RADIOLOGY:  Dg Hip Operative Unilat W Or W/o Pelvis Left  Result Date: 10/19/2018 CLINICAL DATA:  Intraoperative imaging for fixation of a left hip fracture the patient suffered a fall 10/18/2018. Initial encounter. EXAM: OPERATIVE LEFT HIP (WITH PELVIS IF PERFORMED) 2 VIEWS TECHNIQUE: Fluoroscopic spot image(s) were submitted for interpretation post-operatively. COMPARISON:  Single-view of the pelvis 10/18/2018. FINDINGS: Two fluoroscopic intraoperative spot views of the left hip demonstrate 3 screws in place for fixation of a subcapital fracture. Hardware is intact.  Position and alignment are anatomic. No acute finding. IMPRESSION: Intraoperative imaging for fixation of a left hip fracture. No acute finding. Electronically Signed   By: Inge Rise M.D.   On: 10/19/2018 16:59   ASSESSMENT AND PLAN:   Closed left hip fracture  S/p hip surgery.  Pain control, physical therapy and DVT prophylaxis with Eliquis. Acute renal failure on CKD.  Stable.   Paroxysmal atrial fibrillation (HCC) -home dose rate controlling medications.    Resumed Eliquis.   Chronic systolic CHF (congestive heart failure) ([place]) -continue home meds.  Stable.   HTN (hypertension) -home dose antihypertensives  All the records are reviewed and case discussed with Care Management/Social Worker. Management plans discussed with the patient, family and they are in agreement.  CODE STATUS: Full Code  TOTAL TIME TAKING CARE OF THIS PATIENT: 27 minutes.   More than 50% of the time was spent in counseling/coordination of care: YES  POSSIBLE D/C IN 2 DAYS, DEPENDING ON CLINICAL CONDITION.   Demetrios Loll M.D on 10/20/2018 at 2:58 PM  Between 7am to 6pm - Pager - [phone]  After 6pm go to [email or web address] - [place] [facility]

## 2018-10-21 LAB — BASIC METABOLIC PANEL
Anion gap: 8 (ref 5–15)
BUN: 34 mg/dL — ABNORMAL HIGH (ref 8–23)
CO2: 22 mmol/L (ref 22–32)
Calcium: 11.1 mg/dL — ABNORMAL HIGH (ref 8.9–10.3)
Chloride: 110 mmol/L (ref 98–111)
Creatinine, Ser: 1.62 mg/dL — ABNORMAL HIGH (ref 0.61–1.24)
GFR calc Af Amer: 52 mL/min — ABNORMAL LOW (ref >60)
GFR calc non Af Amer: 45 mL/min — ABNORMAL LOW (ref >60)
Glucose, Bld: 87 mg/dL (ref 70–99)
Potassium: 5.1 mmol/L (ref 3.5–5.1)
Sodium: 140 mmol/L (ref 135–145)

## 2018-10-21 MED ORDER — SENNA 8.6 MG PO TABS: 1.0000 | ORAL_TABLET | Freq: Every day | ORAL | 0 refills | Status: DC | PRN

## 2018-10-21 MED ORDER — BISACODYL 5 MG PO TBEC: 5.0000 mg | DELAYED_RELEASE_TABLET | Freq: Every day | ORAL | 0 refills | Status: DC | PRN

## 2018-10-21 MED ORDER — OXYCODONE HCL 5 MG PO TABS: 5.0000 mg | ORAL_TABLET | Freq: Four times a day (QID) | ORAL | 0 refills | Status: AC | PRN

## 2018-10-21 NOTE — TOC Progression Note (Signed)
Transition of Care [facility]) - Progression Note    Patient Details  Name: Jock Mahon MRN: [id number] Date of Birth: 02-Jun-1955  [facility]) CM/SW Contact  Su Hilt, RN Phone Number: 10/21/2018, 9:54 AM  Clinical Narrative:     Reviewed each of the bed offers in detail with the patient The patient chose [facility], I called Claiborne Billings at [place] and she stated that his Covid done on the 13th will be good thru today, [employer] health and started [place] today spoke with Ashely , provided all information, Will fax clinical information to [phone] Reference number [phone]       Expected Discharge Plan and Services           Expected Discharge Date: 10/21/18                                     Social Determinants of Health (SDOH) Interventions    Readmission Risk Interventions Readmission Risk Prevention Plan 10/10/2018  Post Dischage Appt Complete  Medication Screening Complete  Transportation Screening Complete

## 2018-10-21 NOTE — TOC Transition Note (Signed)
Transition of Care [facility]) - CM/SW Discharge Note   Patient Details  Name: Ruben Clark MRN: [id number] Date of Birth: 06-06-1955  Transition of Care [facility]) CM/SW Contact:  Su Hilt, RN Phone Number: 10/21/2018, 2:10 PM   Clinical Narrative:     Patient to DC to [facility] today via EMS Called and left a voice mail for the legal guardian [name] at [phone] and let him know [place] care has offered a bed and we do have [place] in place to go today I left my contact information for a call back The Bedside nurse is to call report to [phone] and EMS when ready to transport   Final next level of care: Skilled Nursing Facility Barriers to Discharge: Barriers Resolved   Patient Goals and CMS Choice        Discharge Placement                       Discharge Plan and Services                                     Social Determinants of Health (SDOH) Interventions     Readmission Risk Interventions Readmission Risk Prevention Plan 10/10/2018  Post Dischage Appt Complete  Medication Screening Complete  Transportation Screening Complete

## 2018-10-21 NOTE — Progress Notes (Signed)
   Subjective: 2 Days Post-Op Procedure(s) (LRB): CANNULATED HIP PINNING (Left) Patient reports pain as mild.   Patient is well, and has had no acute complaints or problems Denies any CP, SOB, ABD pain. We will continue therapy today.   Objective: Vital signs in last 24 hours: Temp:  [97.6 F (36.4 C)-98.2 F (36.8 C)] 97.6 F (36.4 C) (07/16 0810) Pulse Rate:  [70-80] 70 (07/16 0810) Resp:  [17] 17 (07/16 0810) BP: (114-135)/(68-87) 135/87 (07/16 0810) SpO2:  [100 %] 100 % (07/16 0810)  Intake/Output from previous day: 07/15 0701 - 07/16 0700 In: 397.2 [I.V.:297.2; IV Piggyback:100] Out: 1700 [Urine:1700] Intake/Output this shift: No intake/output data recorded.  Recent Labs    10/18/18 2346 10/19/18 0555 10/20/18 0458  HGB 12.5* 10.7* 11.4*   Recent Labs    10/19/18 0555 10/20/18 0458  WBC 15.3* 13.7*  RBC 3.61* 3.84*  HCT 33.6* 36.7*  PLT 315 310   Recent Labs    10/20/18 0458 10/21/18 0312  NA 144 140  K 4.9 5.1  CL 113* 110  CO2 26 22  BUN 40* 34*  CREATININE 1.84* 1.62*  GLUCOSE 109* 87  CALCIUM 10.8* 11.1*   No results for input(s): LABPT, INR in the last 72 hours.  EXAM General - Patient is Alert, Appropriate and Oriented Extremity - Neurovascular intact Sensation intact distally Dorsiflexion/Plantar flexion intact No cellulitis present Compartment soft Dressing - dressing C/D/I and no drainage Motor Function - intact, moving foot and toes well on exam.   Past Medical History:  Diagnosis Date  . CHF (congestive heart failure) ([place])   . Hypertension     Assessment/Plan:   2 Days Post-Op Procedure(s) (LRB): CANNULATED HIP PINNING (Left) Principal Problem:   Closed left hip fracture (HCC) Active Problems:   Atrial fibrillation (HCC)   Chronic systolic CHF (congestive heart failure) (HCC)   HTN (hypertension)  Estimated body mass index is 26.69 kg/m as calculated from the following:   Height as of this encounter: 5\' 10"  (1.778  m).   Weight as of this encounter: 84.4 kg. Advance diet Up with therapy  Labs stable.  CM to assist with discharge to SNF  Follow up with [facility] ortho in 2 weeks for staple removal TEDs x 6 weeks   DVT Prophylaxis - TED hose and Eliquis, SCDs Weight-Bearing as tolerated to Left leg   T. Rachelle Hora, PA-C [place] 10/21/2018, 11:53 AM

## 2018-10-21 NOTE — Progress Notes (Signed)
Report called [place] @ [facility]. EMS called for transportation.

## 2018-10-21 NOTE — Discharge Summary (Addendum)
[name]    MR#:  [id number]  DATE OF BIRTH:  05/06/1955  DATE OF ADMISSION:  10/18/2018   ADMITTING PHYSICIAN: Lance Coon, MD  DATE OF DISCHARGE: 10/21/2018 PRIMARY CARE PHYSICIAN: System, Pcp Not In   ADMISSION DIAGNOSIS:  Closed fracture of left hip, initial encounter ([place]) [S72.002A] DISCHARGE DIAGNOSIS:  Principal Problem:   Closed left hip fracture (HCC) Active Problems:   Atrial fibrillation (HCC)   Chronic systolic CHF (congestive heart failure) (HCC)   HTN (hypertension)  SECONDARY DIAGNOSIS:   Past Medical History:  Diagnosis Date  . CHF (congestive heart failure) ([place])   . Hypertension    HOSPITAL COURSE:  Closed left hip fracture  S/p hip surgery.  Pain control, physical therapy and DVT prophylaxis with Eliquis. Acute renal failure on CKD stage III.  Stable. Paroxysmal atrial fibrillation (HCC)-home dose rate controlling medications.     Continue Eliquis. Chronic systolic CHF (congestive heart failure) ([place]) -continue home meds.  Stable. HTN (hypertension) -continue Lopressor, hold lisinopril and Lasix.  Blood pressure is normal.  Resume if blood pressure is elevated. DISCHARGE CONDITIONS:  Stable, discharge to skilled nursing facility today. CONSULTS OBTAINED:  Treatment Team:  Hessie Knows, MD DRUG ALLERGIES:  No Known Allergies DISCHARGE MEDICATIONS:   Allergies as of 10/21/2018   No Known Allergies     Medication List    STOP taking these medications   furosemide 40 MG tablet Commonly known as: LASIX   lisinopril 2.5 MG tablet Commonly known as: ZESTRIL     TAKE these medications   apixaban 5 MG Tabs tablet Commonly known as: ELIQUIS Take 1 tablet (5 mg total) by mouth 2 (two) times daily.   aspirin EC 81 MG tablet Take 81 mg by mouth daily.   bisacodyl 5 MG EC tablet Commonly known as: DULCOLAX Take 1 tablet (5 mg total) by mouth daily as needed for  moderate constipation.   digoxin 0.25 MG tablet Commonly known as: LANOXIN Take 0.25 mg by mouth daily.   docusate sodium 100 MG capsule Commonly known as: COLACE Take 100 mg by mouth 2 (two) times daily.   finasteride 5 MG tablet Commonly known as: PROSCAR Take 1 tablet (5 mg total) by mouth daily.   gabapentin 300 MG capsule Commonly known as: NEURONTIN Take 300 mg by mouth 2 (two) times daily.   hydrOXYzine 25 MG tablet Commonly known as: ATARAX/VISTARIL Take 25 mg by mouth every 8 (eight) hours as needed for itching.   loratadine 10 MG tablet Commonly known as: CLARITIN Take 10 mg by mouth daily.   LORazepam 0.5 MG tablet Commonly known as: ATIVAN Take 0.5 tablets (0.25 mg total) by mouth daily. What changed: how much to take   metoprolol succinate 25 MG 24 hr tablet Commonly known as: TOPROL-XL Take 0.5 tablets (12.5 mg total) by mouth daily.   oxyCODONE 5 MG immediate release tablet Commonly known as: Oxy IR/ROXICODONE Take 1 tablet (5 mg total) by mouth every 6 (six) hours as needed for up to 3 days for moderate pain or severe pain.   senna 8.6 MG Tabs tablet Commonly known as: SENOKOT Take 1 tablet (8.6 mg total) by mouth daily as needed for mild constipation.        DISCHARGE INSTRUCTIONS:  See AVS.  If you experience worsening of your admission symptoms, develop shortness of breath, life threatening emergency, suicidal or homicidal thoughts you must seek medical attention immediately  by calling 911 or calling your MD immediately  if symptoms less severe.  You Must read complete instructions/literature along with all the possible adverse reactions/side effects for all the Medicines you take and that have been prescribed to you. Take any new Medicines after you have completely understood and accpet all the possible adverse reactions/side effects.   Please note  You were cared for by a hospitalist during your hospital stay. If you have any questions  about your discharge medications or the care you received while you were in the hospital after you are discharged, you can call the unit and asked to speak with the hospitalist on call if the hospitalist that took care of you is not available. Once you are discharged, your primary care physician will handle any further medical issues. Please note that NO REFILLS for any discharge medications will be authorized once you are discharged, as it is imperative that you return to your primary care physician (or establish a relationship with a primary care physician if you do not have one) for your aftercare needs so that they can reassess your need for medications and monitor your lab values.    On the day of Discharge:  VITAL SIGNS:  Blood pressure 135/87, pulse 70, temperature 97.6 F (36.4 C), temperature source Oral, resp. rate 17, height 5\' 10"  (1.778 m), weight 84.4 kg, SpO2 100 %. PHYSICAL EXAMINATION:  GENERAL:  63 y.o.-year-old patient lying in the bed with no acute distress.  EYES: Pupils equal, round, reactive to light and accommodation. No scleral icterus. Extraocular muscles intact.  HEENT: Head atraumatic, normocephalic. Oropharynx and nasopharynx clear.  NECK:  Supple, no jugular venous distention. No thyroid enlargement, no tenderness.  LUNGS: Normal breath sounds bilaterally, no wheezing, rales,rhonchi or crepitation. No use of accessory muscles of respiration.  CARDIOVASCULAR: S1, S2 normal. No murmurs, rubs, or gallops.  ABDOMEN: Soft, non-tender, non-distended. Bowel sounds present. No organomegaly or mass.  EXTREMITIES: No pedal edema, cyanosis, or clubbing.  NEUROLOGIC: Cranial nerves II through XII are intact. Muscle strength 4/5 in all extremities. Sensation intact. Gait not checked.  PSYCHIATRIC: The patient is alert and oriented x 3.  SKIN: No obvious rash, lesion, or ulcer.  DATA REVIEW:   CBC Recent Labs  Lab 10/20/18 0458  WBC 13.7*  HGB 11.4*  HCT 36.7*  PLT 310     Chemistries  Recent Labs  Lab 10/18/18 2346  10/21/18 0312  NA 136   < > 140  K 5.4*   < > 5.1  CL 104   < > 110  CO2 24   < > 22  GLUCOSE 126*   < > 87  BUN 49*   < > 34*  CREATININE 1.87*   < > 1.62*  CALCIUM 11.4*   < > 11.1*  AST 28  --   --   ALT 30  --   --   ALKPHOS 66  --   --   BILITOT 0.6  --   --    < > = values in this interval not displayed.     Microbiology Results  Results for orders placed or performed during the hospital encounter of 10/18/18  SARS Coronavirus 2 (CEPHEID - Performed in [place] hospital lab), Hosp Order     Status: None   Collection Time: 10/18/18 11:46 PM   Specimen: Nasopharyngeal Swab  Result Value Ref Range Status   SARS Coronavirus 2 NEGATIVE NEGATIVE Final    Comment: (NOTE) If result is  NEGATIVE SARS-CoV-2 target nucleic acids are NOT DETECTED. The SARS-CoV-2 RNA is generally detectable in upper and lower  respiratory specimens during the acute phase of infection. The lowest  concentration of SARS-CoV-2 viral copies this assay can detect is 250  copies / mL. A negative result does not preclude SARS-CoV-2 infection  and should not be used as the sole basis for treatment or other  patient management decisions.  A negative result may occur with  improper specimen collection / handling, submission of specimen other  than nasopharyngeal swab, presence of viral mutation(s) within the  areas targeted by this assay, and inadequate number of viral copies  (<250 copies / mL). A negative result must be combined with clinical  observations, patient history, and epidemiological information. If result is POSITIVE SARS-CoV-2 target nucleic acids are DETECTED. The SARS-CoV-2 RNA is generally detectable in upper and lower  respiratory specimens dur ing the acute phase of infection.  Positive  results are indicative of active infection with SARS-CoV-2.  Clinical  correlation with patient history and other diagnostic information is   necessary to determine patient infection status.  Positive results do  not rule out bacterial infection or co-infection with other viruses. If result is PRESUMPTIVE POSTIVE SARS-CoV-2 nucleic acids MAY BE PRESENT.   A presumptive positive result was obtained on the submitted specimen  and confirmed on repeat testing.  While 2019 novel coronavirus  (SARS-CoV-2) nucleic acids may be present in the submitted sample  additional confirmatory testing may be necessary for epidemiological  and / or clinical management purposes  to differentiate between  SARS-CoV-2 and other Sarbecovirus currently known to infect humans.  If clinically indicated additional testing with an alternate test  methodology [phone]) is advised. The SARS-CoV-2 RNA is generally  detectable in upper and lower respiratory sp ecimens during the acute  phase of infection. The expected result is Negative. Fact Sheet for Patients:  StrictlyIdeas.no Fact Sheet for Healthcare Providers: BankingDealers.co.za This test is not yet approved or cleared by the [place] FDA and has been authorized for detection and/or diagnosis of SARS-CoV-2 by FDA under an Emergency Use Authorization (EUA).  This EUA will remain in effect (meaning this test can be used) for the duration of the COVID-19 declaration under Section 564(b)(1) of the Act, 21 U.S.C. section 360bbb-3(b)(1), unless the authorization is terminated or revoked sooner. Performed at [facility], [address] [place], [place] [address]   MRSA PCR Screening     Status: Abnormal   Collection Time: 10/19/18  2:03 AM   Specimen: Nasopharyngeal  Result Value Ref Range Status   MRSA by PCR POSITIVE (A) NEGATIVE Final    Comment:        The GeneXpert MRSA Assay (FDA approved for NASAL specimens only), is one component of a comprehensive MRSA colonization surveillance program. It is not intended to diagnose MRSA  infection nor to guide or monitor treatment for MRSA infections. RESULT CALLED TO, READ BACK BY AND VERIFIED WITH: NIKKI SOLOMON ON 10/19/18 AT [address] [facility] Performed at [facility], [address] [place], [place] [address]     RADIOLOGY:  No results found.   Management plans discussed with the patient, family and they are in agreement.  CODE STATUS: Full Code   TOTAL TIME TAKING CARE OF THIS PATIENT: 32 minutes.    Demetrios Loll M.D on 10/21/2018 at 9:28 AM  Between 7am to 6pm - Pager - [phone]  After 6pm go to [email or web address] - [place]  [facility] [facility]  Office  [phone]  CC: Primary care physician; System, Pcp Not In   Note: This dictation was prepared with Dragon dictation along with smaller phrase technology. Any transcriptional errors that result from this process are unintentional.

## 2018-10-21 NOTE — TOC Progression Note (Signed)
Transition of Care [facility]) - Progression Note    Patient Details  Name: Ruben Clark MRN: [id number] Date of Birth: May 11, 1955  Transition [facility] Care [facility]) CM/SW Contact  Su Hilt, RN Phone Number: 10/21/2018, 3:03 PM  Clinical Narrative:     [name] [phone] attempting to reach the legal guardian [name] left a detailed secure VM requesting a call back and left my contact information, I explained the patient is to be discharged to [facility] to room 35 A today and Insurance authorization has been obtained.  Awaiting a call back to DC patient via EMS to [facility]    Barriers to Discharge: Barriers Resolved  Expected Discharge Plan and Services           Expected Discharge Date: 10/21/18                                     Social Determinants of Health (SDOH) Interventions    Readmission Risk Interventions Readmission Risk Prevention Plan 10/10/2018  Post Dischage Appt Complete  Medication Screening Complete  Transportation Screening Complete

## 2018-10-21 NOTE — TOC Progression Note (Signed)
Transition of Care [facility]) - Progression Note    Patient Details  Name: Ruben Clark MRN: [id number] Date of Birth: 11/29/1955  Transition of Care [facility]) CM/SW [place], RN Phone Number: 10/21/2018, 4:02 PM  Clinical Narrative:    [name] guardian returned my call and gave permission for the patient to go to [facility] for rehab, he will transport today via EMS the nurse will call for transport     Barriers to Discharge: Barriers Resolved  Expected Discharge Plan and Services           Expected Discharge Date: 10/21/18                                     Social Determinants of Health (SDOH) Interventions    Readmission Risk Interventions Readmission Risk Prevention Plan 10/10/2018  Post Dischage Appt Complete  Medication Screening Complete  Transportation Screening Complete

## 2018-10-21 NOTE — Progress Notes (Signed)
Physical Therapy Treatment Patient Details Name: Ruben Clark MRN: [id number] DOB: 1956/02/01 Today's Date: 10/21/2018    History of Present Illness Patient is a 63 y/o M admitted after fall with left hip fracture. S/P hip pinning. WBAT.PMH to include falls, CHF, HTN, L hallux resection and left 2nd toe resection.    PT Comments    Pt agreeable to PT; reports pain "okay at rest" 10+ with assisted movement. Pt participates with supine BLE exercises with increased pain with assisted limited range throughout LLE. Pt noted to guard despite cues/assist. Encouraged work on AP, QS, GS throughout the day and attempted self assisted HS. Pt to discharge to skilled nursing facility today to continue rehab efforts.   Follow Up Recommendations  SNF     Equipment Recommendations  Rolling walker with 5" wheels    Recommendations for Other Services       Precautions / Restrictions Precautions Precautions: Fall Restrictions Weight Bearing Restrictions: Yes LLE Weight Bearing: Weight bearing as tolerated    Mobility  Bed Mobility               General bed mobility comments: Not tested  Transfers                    Ambulation/Gait                 Stairs             Wheelchair Mobility    Modified Rankin (Stroke Patients Only)       Balance                                            Cognition Arousal/Alertness: Awake/alert Behavior During Therapy: WFL for tasks assessed/performed Overall Cognitive Status: Within Functional Limits for tasks assessed                                        Exercises General Exercises - Lower Extremity Ankle Circles/Pumps: AROM;Both;20 reps Quad Sets: Strengthening;Both;15 reps Gluteal Sets: Strengthening;Both;15 reps Short Arc QuadSinclair Ship;Left;15 reps(AROM R) Heel Slides: AAROM;Both;15 reps;Other (comment)(guarding on L and very limited range) Hip ABduction/ADduction: AAROM;Both;15  reps;Other (comment)(decreased range on L, but mild improve with cont;d reps) Straight Leg Raises: AAROM;Both;10 reps;Other (comment)(initiation noted on L then PROM in small range)    General Comments        Pertinent Vitals/Pain Pain Assessment: 0-10 Pain Score: 10-Worst pain ever(with movement; "okay at rest") Pain Location: L hip and lateral thigh Pain Descriptors / Indicators: Burning;Grimacing;Guarding;Moaning(yells out with pain with AAROM) Pain Intervention(s): Monitored during session;Premedicated before session;Limited activity within patient's tolerance    Home Living                      Prior Function            PT Goals (current goals can now be found in the care plan section) Progress towards PT goals: Progressing toward goals(slowly/pain limiting)    Frequency    BID      PT Plan Current plan remains appropriate    Co-evaluation              AM-PAC PT "6 Clicks" Mobility   Outcome Measure  Help needed turning from your back to your side while  in a flat bed without using bedrails?: A Lot Help needed moving from lying on your back to sitting on the side of a flat bed without using bedrails?: A Lot Help needed moving to and from a bed to a chair (including a wheelchair)?: A Lot Help needed standing up from a chair using your arms (e.g., wheelchair or bedside chair)?: A Lot Help needed to walk in hospital room?: A Lot Help needed climbing 3-5 steps with a railing? : Total 6 Click Score: 11    End of Session   Activity Tolerance: Patient limited by pain Patient left: in bed;with call bell/phone within reach;with bed alarm set;Other (comment)(refuses foot pumps)   PT Visit Diagnosis: Unsteadiness on feet (R26.81);Muscle weakness (generalized) (M62.81);Difficulty in walking, not elsewhere classified (R26.2);Pain;History of falling (Z91.81) Pain - Right/Left: Left Pain - part of body: Hip     Time: 3299-2426 PT Time Calculation (min)  (ACUTE ONLY): 23 min  Charges:  $Therapeutic Exercise: 23-37 mins                      Larae Grooms, PTA 10/21/2018, 12:05 PM

## 2018-11-02 NOTE — Addendum Note (Signed)
Addendum  created 11/02/18 0902 by Doreen Salvage, CRNA   Charge Capture section accepted, Visit diagnoses modified

## 2018-11-10 ENCOUNTER — Emergency Department: Payer: Medicare HMO

## 2018-11-10 ENCOUNTER — Other Ambulatory Visit: Payer: Self-pay

## 2018-11-10 ENCOUNTER — Inpatient Hospital Stay
Admission: EM | Admit: 2018-11-10 | Discharge: 2018-11-23 | DRG: 853 | Disposition: A | Discharge status: Skilled Nursing Facility | Payer: Medicare HMO | Attending: Internal Medicine | Admitting: Internal Medicine

## 2018-11-10 DIAGNOSIS — N400 Enlarged prostate without lower urinary tract symptoms: Secondary | ICD-10-CM | POA: Diagnosis present

## 2018-11-10 DIAGNOSIS — Z5320 Procedure and treatment not carried out because of patient's decision for unspecified reasons: Secondary | ICD-10-CM | POA: Diagnosis not present

## 2018-11-10 DIAGNOSIS — Z89422 Acquired absence of other left toe(s): Secondary | ICD-10-CM

## 2018-11-10 DIAGNOSIS — Z600 Problems of adjustment to life-cycle transitions: Secondary | ICD-10-CM | POA: Diagnosis not present

## 2018-11-10 DIAGNOSIS — R739 Hyperglycemia, unspecified: Secondary | ICD-10-CM | POA: Diagnosis present

## 2018-11-10 DIAGNOSIS — Z452 Encounter for adjustment and management of vascular access device: Secondary | ICD-10-CM

## 2018-11-10 DIAGNOSIS — E872 Acidosis: Secondary | ICD-10-CM | POA: Diagnosis present

## 2018-11-10 DIAGNOSIS — I480 Paroxysmal atrial fibrillation: Secondary | ICD-10-CM | POA: Diagnosis present

## 2018-11-10 DIAGNOSIS — Z20828 Contact with and (suspected) exposure to other viral communicable diseases: Secondary | ICD-10-CM | POA: Diagnosis present

## 2018-11-10 DIAGNOSIS — I5042 Chronic combined systolic (congestive) and diastolic (congestive) heart failure: Secondary | ICD-10-CM | POA: Diagnosis present

## 2018-11-10 DIAGNOSIS — Z720 Tobacco use: Secondary | ICD-10-CM | POA: Diagnosis not present

## 2018-11-10 DIAGNOSIS — N136 Pyonephrosis: Secondary | ICD-10-CM | POA: Diagnosis present

## 2018-11-10 DIAGNOSIS — Z8781 Personal history of (healed) traumatic fracture: Secondary | ICD-10-CM

## 2018-11-10 DIAGNOSIS — R296 Repeated falls: Secondary | ICD-10-CM | POA: Diagnosis present

## 2018-11-10 DIAGNOSIS — R634 Abnormal weight loss: Secondary | ICD-10-CM | POA: Diagnosis not present

## 2018-11-10 DIAGNOSIS — Z7982 Long term (current) use of aspirin: Secondary | ICD-10-CM | POA: Diagnosis not present

## 2018-11-10 DIAGNOSIS — I482 Chronic atrial fibrillation, unspecified: Secondary | ICD-10-CM | POA: Diagnosis present

## 2018-11-10 DIAGNOSIS — I130 Hypertensive heart and chronic kidney disease with heart failure and stage 1 through stage 4 chronic kidney disease, or unspecified chronic kidney disease: Secondary | ICD-10-CM | POA: Diagnosis not present

## 2018-11-10 DIAGNOSIS — F17210 Nicotine dependence, cigarettes, uncomplicated: Secondary | ICD-10-CM | POA: Diagnosis not present

## 2018-11-10 DIAGNOSIS — N183 Chronic kidney disease, stage 3 (moderate): Secondary | ICD-10-CM | POA: Diagnosis present

## 2018-11-10 DIAGNOSIS — K769 Liver disease, unspecified: Secondary | ICD-10-CM | POA: Diagnosis present

## 2018-11-10 DIAGNOSIS — E860 Dehydration: Secondary | ICD-10-CM | POA: Diagnosis present

## 2018-11-10 DIAGNOSIS — E43 Unspecified severe protein-calorie malnutrition: Secondary | ICD-10-CM | POA: Diagnosis present

## 2018-11-10 DIAGNOSIS — F17200 Nicotine dependence, unspecified, uncomplicated: Secondary | ICD-10-CM | POA: Diagnosis present

## 2018-11-10 DIAGNOSIS — A4102 Sepsis due to Methicillin resistant Staphylococcus aureus: Principal | ICD-10-CM | POA: Diagnosis present

## 2018-11-10 DIAGNOSIS — Z79899 Other long term (current) drug therapy: Secondary | ICD-10-CM | POA: Diagnosis not present

## 2018-11-10 DIAGNOSIS — C679 Malignant neoplasm of bladder, unspecified: Secondary | ICD-10-CM | POA: Diagnosis present

## 2018-11-10 DIAGNOSIS — R4182 Altered mental status, unspecified: Secondary | ICD-10-CM

## 2018-11-10 DIAGNOSIS — C673 Malignant neoplasm of anterior wall of bladder: Secondary | ICD-10-CM | POA: Diagnosis not present

## 2018-11-10 DIAGNOSIS — E538 Deficiency of other specified B group vitamins: Secondary | ICD-10-CM

## 2018-11-10 DIAGNOSIS — N179 Acute kidney failure, unspecified: Secondary | ICD-10-CM | POA: Diagnosis present

## 2018-11-10 DIAGNOSIS — N1330 Unspecified hydronephrosis: Secondary | ICD-10-CM | POA: Diagnosis not present

## 2018-11-10 DIAGNOSIS — B9562 Methicillin resistant Staphylococcus aureus infection as the cause of diseases classified elsewhere: Secondary | ICD-10-CM | POA: Diagnosis not present

## 2018-11-10 DIAGNOSIS — R64 Cachexia: Secondary | ICD-10-CM | POA: Diagnosis present

## 2018-11-10 DIAGNOSIS — E785 Hyperlipidemia, unspecified: Secondary | ICD-10-CM | POA: Diagnosis present

## 2018-11-10 DIAGNOSIS — Z8614 Personal history of Methicillin resistant Staphylococcus aureus infection: Secondary | ICD-10-CM | POA: Diagnosis not present

## 2018-11-10 DIAGNOSIS — Z800 Family history of malignant neoplasm of digestive organs: Secondary | ICD-10-CM

## 2018-11-10 DIAGNOSIS — Z801 Family history of malignant neoplasm of trachea, bronchus and lung: Secondary | ICD-10-CM

## 2018-11-10 DIAGNOSIS — J449 Chronic obstructive pulmonary disease, unspecified: Secondary | ICD-10-CM | POA: Diagnosis present

## 2018-11-10 DIAGNOSIS — Z7189 Other specified counseling: Secondary | ICD-10-CM | POA: Diagnosis not present

## 2018-11-10 DIAGNOSIS — Z960 Presence of urogenital implants: Secondary | ICD-10-CM | POA: Diagnosis not present

## 2018-11-10 DIAGNOSIS — R531 Weakness: Secondary | ICD-10-CM | POA: Diagnosis present

## 2018-11-10 DIAGNOSIS — Z87442 Personal history of urinary calculi: Secondary | ICD-10-CM

## 2018-11-10 DIAGNOSIS — G9341 Metabolic encephalopathy: Secondary | ICD-10-CM | POA: Diagnosis present

## 2018-11-10 DIAGNOSIS — R7881 Bacteremia: Secondary | ICD-10-CM | POA: Diagnosis not present

## 2018-11-10 DIAGNOSIS — N131 Hydronephrosis with ureteral stricture, not elsewhere classified: Secondary | ICD-10-CM | POA: Diagnosis not present

## 2018-11-10 DIAGNOSIS — Z6821 Body mass index (BMI) 21.0-21.9, adult: Secondary | ICD-10-CM | POA: Diagnosis not present

## 2018-11-10 DIAGNOSIS — D631 Anemia in chronic kidney disease: Secondary | ICD-10-CM | POA: Diagnosis present

## 2018-11-10 DIAGNOSIS — Z515 Encounter for palliative care: Secondary | ICD-10-CM | POA: Diagnosis not present

## 2018-11-10 DIAGNOSIS — E876 Hypokalemia: Secondary | ICD-10-CM | POA: Diagnosis not present

## 2018-11-10 DIAGNOSIS — I110 Hypertensive heart disease with heart failure: Secondary | ICD-10-CM | POA: Diagnosis not present

## 2018-11-10 DIAGNOSIS — R6520 Severe sepsis without septic shock: Secondary | ICD-10-CM | POA: Diagnosis present

## 2018-11-10 DIAGNOSIS — N329 Bladder disorder, unspecified: Secondary | ICD-10-CM | POA: Diagnosis not present

## 2018-11-10 DIAGNOSIS — D72829 Elevated white blood cell count, unspecified: Secondary | ICD-10-CM | POA: Diagnosis not present

## 2018-11-10 DIAGNOSIS — Z936 Other artificial openings of urinary tract status: Secondary | ICD-10-CM | POA: Diagnosis not present

## 2018-11-10 DIAGNOSIS — D494 Neoplasm of unspecified behavior of bladder: Secondary | ICD-10-CM | POA: Diagnosis not present

## 2018-11-10 DIAGNOSIS — R509 Fever, unspecified: Secondary | ICD-10-CM | POA: Diagnosis not present

## 2018-11-10 DIAGNOSIS — I4891 Unspecified atrial fibrillation: Secondary | ICD-10-CM | POA: Diagnosis not present

## 2018-11-10 DIAGNOSIS — C678 Malignant neoplasm of overlapping sites of bladder: Secondary | ICD-10-CM | POA: Diagnosis not present

## 2018-11-10 DIAGNOSIS — I509 Heart failure, unspecified: Secondary | ICD-10-CM | POA: Diagnosis not present

## 2018-11-10 DIAGNOSIS — Z7901 Long term (current) use of anticoagulants: Secondary | ICD-10-CM

## 2018-11-10 DIAGNOSIS — Z95828 Presence of other vascular implants and grafts: Secondary | ICD-10-CM | POA: Diagnosis not present

## 2018-11-10 DIAGNOSIS — D649 Anemia, unspecified: Secondary | ICD-10-CM | POA: Diagnosis not present

## 2018-11-10 HISTORY — DX: Chronic kidney disease, stage 3 unspecified: N18.30

## 2018-11-10 LAB — CBC WITH DIFFERENTIAL/PLATELET
Abs Immature Granulocytes: 0.05 10*3/uL (ref 0.00–0.07)
Basophils Absolute: 0.1 10*3/uL (ref 0.0–0.1)
Basophils Relative: 1 %
Eosinophils Absolute: 0.1 10*3/uL (ref 0.0–0.5)
Eosinophils Relative: 1 %
HCT: 32.8 % — ABNORMAL LOW (ref 39.0–52.0)
Hemoglobin: 10.4 g/dL — ABNORMAL LOW (ref 13.0–17.0)
Immature Granulocytes: 0 %
Lymphocytes Relative: 7 %
Lymphs Abs: 1 10*3/uL (ref 0.7–4.0)
MCH: 29.1 pg (ref 26.0–34.0)
MCHC: 31.7 g/dL (ref 30.0–36.0)
MCV: 91.6 fL (ref 80.0–100.0)
Monocytes Absolute: 1.4 10*3/uL — ABNORMAL HIGH (ref 0.1–1.0)
Monocytes Relative: 10 %
Neutro Abs: 11 10*3/uL — ABNORMAL HIGH (ref 1.7–7.7)
Neutrophils Relative %: 81 %
Platelets: 519 10*3/uL — ABNORMAL HIGH (ref 150–400)
RBC: 3.58 MIL/uL — ABNORMAL LOW (ref 4.22–5.81)
RDW: 14.6 % (ref 11.5–15.5)
WBC: 13.6 10*3/uL — ABNORMAL HIGH (ref 4.0–10.5)
nRBC: 0 % (ref 0.0–0.2)

## 2018-11-10 LAB — TROPONIN I (HIGH SENSITIVITY)
Troponin I (High Sensitivity): 25 ng/L — ABNORMAL HIGH (ref <18)
Troponin I (High Sensitivity): 24 ng/L — ABNORMAL HIGH (ref <18)

## 2018-11-10 LAB — SARS CORONAVIRUS 2 BY RT PCR (HOSPITAL ORDER, PERFORMED IN ~~LOC~~ HOSPITAL LAB): SARS Coronavirus 2: NEGATIVE

## 2018-11-10 LAB — MAGNESIUM: Magnesium: 2.4 mg/dL (ref 1.7–2.4)

## 2018-11-10 LAB — BASIC METABOLIC PANEL
Anion gap: 10 (ref 5–15)
BUN: 55 mg/dL — ABNORMAL HIGH (ref 8–23)
CO2: 19 mmol/L — ABNORMAL LOW (ref 22–32)
Calcium: 14.2 mg/dL (ref 8.9–10.3)
Chloride: 108 mmol/L (ref 98–111)
Creatinine, Ser: 2.76 mg/dL — ABNORMAL HIGH (ref 0.61–1.24)
GFR calc Af Amer: 27 mL/min — ABNORMAL LOW (ref >60)
GFR calc non Af Amer: 24 mL/min — ABNORMAL LOW (ref >60)
Glucose, Bld: 141 mg/dL — ABNORMAL HIGH (ref 70–99)
Potassium: 4.6 mmol/L (ref 3.5–5.1)
Sodium: 137 mmol/L (ref 135–145)

## 2018-11-10 LAB — BRAIN NATRIURETIC PEPTIDE: B Natriuretic Peptide: 81 pg/mL (ref 0.0–100.0)

## 2018-11-10 MED ORDER — SODIUM CHLORIDE 0.9 % IV SOLN
INTRAVENOUS | Status: DC
  Administered 2018-11-10 – 2018-11-14 (×10): via INTRAVENOUS

## 2018-11-10 MED ORDER — GABAPENTIN 300 MG PO CAPS
300.0000 mg | ORAL_CAPSULE | Freq: Two times a day (BID) | ORAL | Status: DC
  Administered 2018-11-10 – 2018-11-23 (×25): 300 mg via ORAL
  Filled 2018-11-10 (×25): qty 1

## 2018-11-10 MED ORDER — HYDROXYZINE HCL 25 MG PO TABS
25.0000 mg | ORAL_TABLET | Freq: Three times a day (TID) | ORAL | Status: DC | PRN
  Administered 2018-11-20: 25 mg via ORAL
  Filled 2018-11-10: qty 1

## 2018-11-10 MED ORDER — APIXABAN 5 MG PO TABS
5.0000 mg | ORAL_TABLET | Freq: Two times a day (BID) | ORAL | Status: DC
  Administered 2018-11-10 – 2018-11-12 (×4): 5 mg via ORAL
  Filled 2018-11-10 (×4): qty 1

## 2018-11-10 MED ORDER — SODIUM CHLORIDE 0.9 % IV SOLN
Freq: Once | INTRAVENOUS | Status: AC
  Administered 2018-11-10: 18:00:00 via INTRAVENOUS

## 2018-11-10 MED ORDER — ASPIRIN EC 81 MG PO TBEC
81.0000 mg | DELAYED_RELEASE_TABLET | Freq: Every day | ORAL | Status: DC
  Administered 2018-11-11 – 2018-11-23 (×12): 81 mg via ORAL
  Filled 2018-11-10 (×12): qty 1

## 2018-11-10 MED ORDER — LORATADINE 10 MG PO TABS
10.0000 mg | ORAL_TABLET | Freq: Every day | ORAL | Status: DC
  Administered 2018-11-11 – 2018-11-23 (×12): 10 mg via ORAL
  Filled 2018-11-10 (×12): qty 1

## 2018-11-10 MED ORDER — FINASTERIDE 5 MG PO TABS
5.0000 mg | ORAL_TABLET | Freq: Every day | ORAL | Status: DC
  Administered 2018-11-11 – 2018-11-23 (×12): 5 mg via ORAL
  Filled 2018-11-10 (×12): qty 1

## 2018-11-10 MED ORDER — ONDANSETRON HCL 4 MG/2ML IJ SOLN: 4.0000 mg | Freq: Four times a day (QID) | INTRAMUSCULAR | Status: DC | PRN

## 2018-11-10 MED ORDER — METOPROLOL SUCCINATE ER 25 MG PO TB24
12.5000 mg | ORAL_TABLET | Freq: Every day | ORAL | Status: DC
  Administered 2018-11-11 – 2018-11-22 (×9): 12.5 mg via ORAL
  Filled 2018-11-10 (×12): qty 1

## 2018-11-10 MED ORDER — LORAZEPAM 0.5 MG PO TABS
0.5000 mg | ORAL_TABLET | Freq: Every day | ORAL | Status: DC
  Administered 2018-11-11 – 2018-11-23 (×11): 0.5 mg via ORAL
  Filled 2018-11-10 (×11): qty 1

## 2018-11-10 MED ORDER — DIGOXIN 250 MCG PO TABS
0.2500 mg | ORAL_TABLET | Freq: Every day | ORAL | Status: DC
  Administered 2018-11-11 – 2018-11-23 (×12): 0.25 mg via ORAL
  Filled 2018-11-10 (×13): qty 1

## 2018-11-10 MED ORDER — ACETAMINOPHEN 325 MG PO TABS
650.0000 mg | ORAL_TABLET | Freq: Four times a day (QID) | ORAL | Status: DC | PRN
  Administered 2018-11-13 – 2018-11-18 (×2): 650 mg via ORAL
  Filled 2018-11-10 (×3): qty 2

## 2018-11-10 MED ORDER — BISACODYL 5 MG PO TBEC: 5.0000 mg | DELAYED_RELEASE_TABLET | Freq: Every day | ORAL | Status: DC | PRN

## 2018-11-10 MED ORDER — ACETAMINOPHEN 650 MG RE SUPP: 650.0000 mg | Freq: Four times a day (QID) | RECTAL | Status: DC | PRN

## 2018-11-10 MED ORDER — ONDANSETRON HCL 4 MG PO TABS: 4.0000 mg | ORAL_TABLET | Freq: Four times a day (QID) | ORAL | Status: DC | PRN

## 2018-11-10 MED ORDER — DOCUSATE SODIUM 100 MG PO CAPS
100.0000 mg | ORAL_CAPSULE | Freq: Two times a day (BID) | ORAL | Status: DC
  Administered 2018-11-11 – 2018-11-23 (×24): 100 mg via ORAL
  Filled 2018-11-10 (×24): qty 1

## 2018-11-10 MED ORDER — SENNA 8.6 MG PO TABS: 1.0000 | ORAL_TABLET | Freq: Every day | ORAL | Status: DC | PRN

## 2018-11-10 MED ORDER — LACTATED RINGERS IV BOLUS
500.0000 mL | Freq: Once | INTRAVENOUS | Status: AC
  Administered 2018-11-10: 500 mL via INTRAVENOUS

## 2018-11-10 NOTE — ED Notes (Signed)
This Rn attempted to call report w/o success at this time. This RN will attempt again.

## 2018-11-10 NOTE — ED Notes (Signed)
Pt removed monitor equipment multiple times. This RN has paced equipment back on pt. Pt advised to keep equipment on at this time.

## 2018-11-10 NOTE — ED Provider Notes (Signed)
[facility] Emergency Department Provider Note   ____________________________________________   First MD Initiated Contact with Patient 11/10/18 1627     (approximate)  I have reviewed the triage vital signs and the nursing notes.   HISTORY  Chief Complaint Weakness    HPI Ruben Clark is a 63 y.o. male with past medical history of hypertension, CHF, A. fib presents to the ED due to inability to walk.  Patient was recently admitted to the hospital for left hip fracture and had been residing at a rehab facility after discharge.  Today, he was discharged from the rehab facility to the group home where he had previously resided.  When it was discovered that he could no longer walk and care for himself, requirement for this facility, he was sent back to the ED.  Patient reports he has been feeling short of breath and weak for the past 3 days, but denies any fevers, cough, or chest pain.  He states his left hip is feeling better and denies any significant pain.        Past Medical History:  Diagnosis Date  . CHF (congestive heart failure) ([place])   . Chronic kidney disease (CKD), stage III (moderate) (H[facility])   . Hypertension     Patient Active Problem List   Diagnosis Date Noted  . Hypercalcemia 11/10/2018  . Closed left hip fracture ([place]) 10/19/2018  . Chronic systolic CHF (congestive heart failure) ([place]) 10/19/2018  . HTN (hypertension) 10/19/2018  . Sepsis ([place]) 10/07/2018  . COPD (chronic obstructive pulmonary disease) ([place]) 08/26/2018  . Atrial fibrillation ([place]) 08/26/2018  . Left foot infection 08/02/2018    Past Surgical History:  Procedure Laterality Date  . AMPUTATION Left 08/03/2018   Procedure: AMPUTATION RAY SECOND TOE;  Surgeon: Sharlotte Alamo, DPM;  Location: [facility] ORS;  Service: Podiatry;  Laterality: Left;  . HIP PINNING,CANNULATED Left 10/19/2018   Procedure: CANNULATED HIP PINNING;  Surgeon: Hessie Knows, MD;  Location: [facility] ORS;  Service:  Orthopedics;  Laterality: Left;  . IRRIGATION AND DEBRIDEMENT FOOT Left 08/05/2018   Procedure: IRRIGATION AND DEBRIDEMENT FOOT;  Surgeon: Sharlotte Alamo, DPM;  Location: [facility] ORS;  Service: Podiatry;  Laterality: Left;  . TOE AMPUTATION      Prior to Admission medications   Medication Sig Start Date End Date Taking? Authorizing Provider  apixaban (ELIQUIS) 5 MG TABS tablet Take 1 tablet (5 mg total) by mouth 2 (two) times daily. 08/09/18  Yes Dustin Flock, MD  aspirin EC 81 MG tablet Take 81 mg by mouth daily.   Yes [provider]  digoxin (LANOXIN) 0.25 MG tablet Take 0.25 mg by mouth daily.   Yes [provider]  docusate sodium (COLACE) 100 MG capsule Take 100 mg by mouth 2 (two) times daily.   Yes [provider]  finasteride (PROSCAR) 5 MG tablet Take 1 tablet (5 mg total) by mouth daily. 08/09/18  Yes Dustin Flock, MD  gabapentin (NEURONTIN) 300 MG capsule Take 300 mg by mouth 2 (two) times daily.    Yes [provider]  hydrOXYzine (ATARAX/VISTARIL) 25 MG tablet Take 25 mg by mouth every 8 (eight) hours as needed for itching.    Yes [provider]  loratadine (CLARITIN) 10 MG tablet Take 10 mg by mouth daily.   Yes [provider]  LORazepam (ATIVAN) 0.5 MG tablet Take 0.5 tablets (0.25 mg total) by mouth daily. Patient taking differently: Take 0.5 mg by mouth daily.  08/09/18  Yes Dustin Flock,  MD  bisacodyl (DULCOLAX) 5 MG EC tablet Take 1 tablet (5 mg total) by mouth daily as needed for moderate constipation. 10/21/18   Demetrios Loll, MD  metoprolol succinate (TOPROL-XL) 25 MG 24 hr tablet Take 0.5 tablets (12.5 mg total) by mouth daily. 10/10/18 11/09/18  Saundra Shelling, MD  senna (SENOKOT) 8.6 MG TABS tablet Take 1 tablet (8.6 mg total) by mouth daily as needed for mild constipation. 10/21/18   Demetrios Loll, MD    Allergies Patient has no known allergies.  History reviewed. No pertinent family history.  Social History Social  History   Tobacco Use  . Smoking status: Current Every Day Smoker  . Smokeless tobacco: Never Used  Substance Use Topics  . Alcohol use: Never    Frequency: Never  . Drug use: Never    Review of Systems  Constitutional: No fever/chills Eyes: No visual changes. ENT: No sore throat. Cardiovascular: Denies chest pain. Respiratory: Positive for shortness of breath Gastrointestinal: No abdominal pain.  No nausea, no vomiting.  No diarrhea.  No constipation. Genitourinary: Negative for dysuria. Musculoskeletal: Negative for back pain. Skin: Negative for rash. Neurological: Negative for headaches, focal weakness or numbness.  Positive for generalized weakness  ____________________________________________   PHYSICAL EXAM:  VITAL SIGNS: ED Triage Vitals [11/10/18 1625]  Enc Vitals Group     BP 102/67     Pulse Rate 90     Resp 17     Temp 97.6 F (36.4 C)     Temp Source Oral     SpO2 99 %     Weight 187 lb 6.3 oz (85 kg)     Height 5\' 10"  (1.778 m)     Head Circumference      Peak Flow      Pain Score 0     Pain Loc      Pain Edu?      Excl. in [place]?     Constitutional: Alert and oriented to person and place but not time.  Debilitated appearing male. Eyes: Conjunctivae are normal. Head: Atraumatic. Nose: No congestion/rhinnorhea. Mouth/Throat: Mucous membranes are moist. Neck: Normal ROM Cardiovascular: Normal rate, irregularly irregular rhythm. Grossly normal heart sounds. Respiratory: Normal respiratory effort.  No retractions. Lungs CTAB. Gastrointestinal: Soft and nontender. No distention. Genitourinary: deferred Musculoskeletal: No lower extremity tenderness nor edema.  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  Surgical sites near left hip clean, dry, and intact. Neurologic:  Normal speech and language. No gross focal neurologic deficits are appreciated. Skin:  Skin is warm, dry and intact. No rash noted. Psychiatric: Mood and affect are normal. Speech  and behavior are normal.  ____________________________________________   LABS (all labs ordered are listed, but only abnormal results are displayed)  Labs Reviewed  CBC WITH DIFFERENTIAL/PLATELET - Abnormal; Notable for the following components:      Result Value   WBC 13.6 (*)    RBC 3.58 (*)    Hemoglobin 10.4 (*)    HCT 32.8 (*)    Platelets 519 (*)    Neutro Abs 11.0 (*)    Monocytes Absolute 1.4 (*)    All other components within normal limits  BASIC METABOLIC PANEL - Abnormal; Notable for the following components:   CO2 19 (*)    Glucose, Bld 141 (*)    BUN 55 (*)    Creatinine, Ser 2.76 (*)    Calcium 14.2 (*)    GFR calc non Af Amer 24 (*)    GFR calc Af  Amer 27 (*)    All other components within normal limits  TROPONIN I (HIGH SENSITIVITY) - Abnormal; Notable for the following components:   Troponin I (High Sensitivity) 25 (*)    All other components within normal limits  SARS CORONAVIRUS 2 (HOSPITAL ORDER, [facility])  BRAIN NATRIURETIC PEPTIDE  URINALYSIS, COMPLETE (UACMP) WITH MICROSCOPIC  MAGNESIUM  TROPONIN I (HIGH SENSITIVITY)   ____________________________________________  EKG  ED ECG REPORT I, Blake Divine, the attending physician, personally viewed and interpreted this ECG.   Date: 11/10/2018  EKG Time: 16:25  Rate: 92  Rhythm: atrial fibrillation, rate 92, LBBB  Axis: LAD  Intervals:left bundle branch block  ST&T Change: None    PROCEDURES  Procedure(s) performed (including Critical Care):  Procedures   ____________________________________________   INITIAL IMPRESSION / ASSESSMENT AND PLAN / ED COURSE       63 year old male presenting after leaving rehab today to go to his group home, found to be unable to walk and weaker than his baseline upon arrival there.  Differential includes intracranial process, stroke, dehydration, electrolyte abnormality, UTI, pneumonia.  Patient nontoxic-appearing with  stable vital signs.  He has a nonfocal neurologic exam and head CT negative for acute process, do not suspect stroke.  He does have diffuse weakness and is oriented x2, unclear what his cognitive baseline is.  No apparent infectious process on chest x-ray, awaiting UA results.  Labs are significant for hypercalcemia, no apparent history of malignancy to explain this.  We will start IV fluid hydration with normal saline, case discussed with hospitalist who accepts patient for admission.      ____________________________________________   FINAL CLINICAL IMPRESSION(S) / ED DIAGNOSES  Final diagnoses:  Hypercalcemia  Generalized weakness  Altered mental status, unspecified altered mental status type     ED Discharge Orders    None       Note:  This document was prepared using Dragon voice recognition software and may include unintentional dictation errors.   Blake Divine, MD 11/10/18 [phone]

## 2018-11-10 NOTE — ED Notes (Signed)
ED TO INPATIENT HANDOFF REPORT  ED Nurse Name and Phone #: [phone]  S Name/Age/Gender Ruben Clark 63 y.o. male Room/Bed: ED19A/ED19A  Code Status   Code Status: Prior  Home/SNF/Other  A/Ox4 Is this baseline? Yes   Triage Complete: Triage complete  Chief Complaint Weakness  Triage Note Pt comes EMS from [place] after being d/c from [facility]. Pt recently had left hip fracture and had sx here. Pt d/c to [facility] for rehab then they determined he could go home and sent him to group home with premise that he could walk but when he got there he could not so they sent him here. Pt now c/o some SOB but is AOx4.    Allergies No Known Allergies  Level of Care/Admitting Diagnosis ED Disposition    ED Disposition Condition [facility] Area: [place] [100120]  Level of Care: Med-Surg [16]  Covid Evaluation: Asymptomatic Screening Protocol (No Symptoms)  Diagnosis: Hypercalcemia [275.42.ICD-9-CM]  Admitting Physician: Saundra Shelling [[id number]]  Attending Physician: Saundra Shelling [[id number]]  Estimated length of stay: past midnight tomorrow  Certification:: I certify this patient will need inpatient services for at least 2 midnights  PT Class (Do Not Modify): Inpatient [101]  PT Acc Code (Do Not Modify): Private [1]       B Medical/Surgery History Past Medical History:  Diagnosis Date  . CHF (congestive heart failure) ([place])   . Chronic kidney disease (CKD), stage III (moderate) (H[facility])   . Hypertension    Past Surgical History:  Procedure Laterality Date  . AMPUTATION Left 08/03/2018   Procedure: AMPUTATION RAY SECOND TOE;  Surgeon: Sharlotte Alamo, DPM;  Location: [facility] ORS;  Service: Podiatry;  Laterality: Left;  . HIP PINNING,CANNULATED Left 10/19/2018   Procedure: CANNULATED HIP PINNING;  Surgeon: Hessie Knows, MD;  Location: [facility] ORS;  Service: Orthopedics;  Laterality: Left;  . IRRIGATION AND DEBRIDEMENT FOOT  Left 08/05/2018   Procedure: IRRIGATION AND DEBRIDEMENT FOOT;  Surgeon: Sharlotte Alamo, DPM;  Location: [facility] ORS;  Service: Podiatry;  Laterality: Left;  . TOE AMPUTATION       A IV Location/Drains/Wounds Patient Lines/Drains/Airways Status   Active Line/Drains/Airways    Name:   Placement date:   Placement time:   Site:   Days:   Peripheral IV 10/18/18 Right Antecubital   10/18/18    2341    Antecubital   23   Peripheral IV 11/10/18 Left Arm   11/10/18    1628    Arm   less than 1   PICC Single Lumen 76/16/07 PICC Left Basilic 45 cm 0 cm   37/10/62    6948    Basilic   97   External Urinary Catheter   10/19/18    0141    -   22   Incision (Closed) 08/03/18 Foot Left   08/03/18    1321     99   Incision (Closed) 08/05/18 Foot Left   08/05/18    1249     97   Incision (Closed) 10/19/18 Hip Left   10/19/18    1632     22   Wound / Incision (Open or Dehisced) 08/02/18 Other (Comment) Foot Left   08/02/18    1537    Foot   100          Intake/Output Last 24 hours No intake or output data in the 24 hours ending 11/10/18 1929  Labs/Imaging Results for orders placed  or performed during the hospital encounter of 11/10/18 (from the past 48 hour(s))  Brain natriuretic peptide     Status: None   Collection Time: 11/10/18  4:29 PM  Result Value Ref Range   B Natriuretic Peptide 81.0 0.0 - 100.0 pg/mL    Comment: Performed at [facility], [address] [place], [place] [address]  CBC with Differential     Status: Abnormal   Collection Time: 11/10/18  4:31 PM  Result Value Ref Range   WBC 13.6 (H) 4.0 - 10.5 K/uL   RBC 3.58 (L) 4.22 - 5.81 MIL/uL   Hemoglobin 10.4 (L) 13.0 - 17.0 g/dL   HCT 32.8 (L) 39.0 - 52.0 %   MCV 91.6 80.0 - 100.0 fL   MCH 29.1 26.0 - 34.0 pg   MCHC 31.7 30.0 - 36.0 g/dL   RDW 14.6 11.5 - 15.5 %   Platelets 519 (H) 150 - 400 K/uL   nRBC 0.0 0.0 - 0.2 %   Neutrophils Relative % 81 %   Neutro Abs 11.0 (H) 1.7 - 7.7 K/uL   Lymphocytes Relative 7 %    Lymphs Abs 1.0 0.7 - 4.0 K/uL   Monocytes Relative 10 %   Monocytes Absolute 1.4 (H) 0.1 - 1.0 K/uL   Eosinophils Relative 1 %   Eosinophils Absolute 0.1 0.0 - 0.5 K/uL   Basophils Relative 1 %   Basophils Absolute 0.1 0.0 - 0.1 K/uL   Immature Granulocytes 0 %   Abs Immature Granulocytes 0.05 0.00 - 0.07 K/uL    Comment: Performed at [facility], [address] [place], [place] [address]  Basic metabolic panel     Status: Abnormal   Collection Time: 11/10/18  4:31 PM  Result Value Ref Range   Sodium 137 135 - 145 mmol/L   Potassium 4.6 3.5 - 5.1 mmol/L   Chloride 108 98 - 111 mmol/L   CO2 19 (L) 22 - 32 mmol/L   Glucose, Bld 141 (H) 70 - 99 mg/dL   BUN 55 (H) 8 - 23 mg/dL   Creatinine, Ser 2.76 (H) 0.61 - 1.24 mg/dL   Calcium 14.2 (HH) 8.9 - 10.3 mg/dL    Comment: CRITICAL RESULT CALLED TO, READ BACK BY AND VERIFIED WITH Cortny Bambach 11/10/18 @ 1715  MLK    GFR calc non Af Amer 24 (L) >60 mL/min   GFR calc Af Amer 27 (L) >60 mL/min   Anion gap 10 5 - 15    Comment: Performed at [facility], [place], [place] [address]  Troponin I (High Sensitivity)     Status: Abnormal   Collection Time: 11/10/18  4:31 PM  Result Value Ref Range   Troponin I (High Sensitivity) 25 (H) <18 ng/L    Comment: (NOTE) Elevated high sensitivity troponin I (hsTnI) values and significant  changes across serial measurements may suggest ACS but many other  chronic and acute conditions are known to elevate hsTnI results.  Refer to the "Links" section for chest pain algorithms and additional  guidance. Performed at [facility], [address] [place], [place] [address]   Magnesium     Status: None   Collection Time: 11/10/18  6:40 PM  Result Value Ref Range   Magnesium 2.4 1.7 - 2.4 mg/dL    Comment: Performed at [facility], [address] [place],  [address]   Ct Head Wo Contrast  Result Date: 11/10/2018 CLINICAL DATA:  Altered  level  of consciousness. EXAM: CT HEAD WITHOUT CONTRAST TECHNIQUE: Contiguous axial images were obtained from the base of the skull through the vertex without intravenous contrast. COMPARISON:  None. FINDINGS: Brain: Mild atrophy and minimal small vessel disease. The brain demonstrates no evidence of hemorrhage, infarction, edema, mass effect, extra-axial fluid collection, hydrocephalus or mass lesion. Vascular: No hyperdense vessel or unexpected calcification. Skull: Normal. Negative for fracture or focal lesion. Sinuses/Orbits: No acute finding. Other: None. IMPRESSION: Mild atrophy and small vessel disease.  No acute findings. Electronically Signed   By: Aletta Edouard M.D.   On: 11/10/2018 17:55   Dg Chest Portable 1 View  Result Date: 11/10/2018 CLINICAL DATA:  Shortness of breath for the past 2-3 days. Smoker. EXAM: PORTABLE CHEST 1 VIEW COMPARISON:  10/19/2018. FINDINGS: Normal sized heart. Clear lungs with normal vascularity. Small calcified granuloma in the right lower lung zone. Unremarkable bones. IMPRESSION: No acute abnormality. Electronically Signed   By: Claudie Revering M.D.   On: 11/10/2018 17:11    Pending Labs Unresulted Labs (From admission, onward)    Start     Ordered   11/10/18 1644  Urinalysis, Complete w Microscopic  ONCE - STAT,   STAT     11/10/18 1643   11/10/18 1632  SARS Coronavirus 2 [facility] order, Performed in [facility] hospital lab) Nasopharyngeal Nasopharyngeal Swab  (Symptomatic/High Risk of Exposure/Tier 1 Patients Labs with Precautions)  Once,   STAT    Question Answer Comment  Is this test for diagnosis or screening Diagnosis of ill patient   Symptomatic for COVID-19 as defined by CDC Yes   Date of Symptom Onset 11/07/2018   Hospitalized for COVID-19 Yes   Admitted to ICU for COVID-19 No   Previously tested for COVID-19 Yes   Resident in a congregate (group) care setting Yes   Employed in healthcare setting No      11/10/18 1631   Signed and Held  Basic  metabolic panel  Tomorrow morning,   R     Signed and Held   Signed and Held  CBC  Tomorrow morning,   R     Signed and Held          Vitals/Pain Today's Vitals   11/10/18 1730 11/10/18 1745 11/10/18 1800 11/10/18 1900  BP: 110/60 109/62 (!) 95/59 106/75  Pulse:  77 94   Resp:      Temp:      TempSrc:      SpO2:  100% 100%   Weight:      Height:      PainSc:        Isolation Precautions Airborne and Contact precautions  Medications Medications  lactated ringers bolus 500 mL (0 mLs Intravenous Stopped 11/10/18 1746)  0.9 %  sodium chloride infusion ( Intravenous New Bag/Given 11/10/18 1746)    Mobility walks with device Moderate fall risk   Focused Assessments    R Recommendations: See Admitting Provider Note  Report given to:   Additional Notes:

## 2018-11-10 NOTE — ED Notes (Signed)
Pt has removed monitor equipment. This RN educating pt and advising pt to keep monitor equipment on.

## 2018-11-10 NOTE — ED Notes (Signed)
MD Preddy made ware of BP Sys 94/63.

## 2018-11-10 NOTE — Progress Notes (Signed)
Advanced care plan. Purpose of the Encounter: CODE STATUS Parties in Attendance:Patient Patient's Decision Capacity: Okay Subjective/Patient's story: Ruben Clark  is a 63 y.o. male with a known history of hip fracture, chronic systolic congestive heart failure, CKD stage III, hypertension, chronic afib on anticoagulation with eliquis was referred from group home.  Patient recently had hip fracture which was repaired and sent to rehab.  Day ago patient was sent from rehab group home.  He has generalized weakness.  Objective/Medical story Patient was worked up in the emergency room was found to have elevated calcium of 14.  Some dry and dehydrated needs IV fluids.  Was worked up with CT head which showed no acute abnormality. Goals of care determination:  Advance care directives goals of care treatment plan discussed No patient wants everything done and is full resuscitation CODE STATUS: Full code Time spent discussing advanced care planning: 16 minutes

## 2018-11-10 NOTE — H&P (Addendum)
[facility] NAME: Ruben Clark    MR#:  [id number]  DATE OF BIRTH:  [birth year]  DATE OF ADMISSION:  11/10/2018  PRIMARY CARE PHYSICIAN: System, Pcp Not In   REQUESTING/REFERRING PHYSICIAN:   CHIEF COMPLAINT:   Chief Complaint  Patient presents with  . Weakness    HISTORY OF PRESENT ILLNESS: Ruben Clark  is a 63 y.o. male with a known history of hip fracture, chronic systolic congestive heart failure, CKD stage III, hypertension, chronic afib on anticoagulation with eliquis was referred from group home.  Patient recently had hip fracture which was repaired and sent to rehab.  Day ago patient was sent from rehab group home.  He has generalized weakness.  Was evaluated in the emergency room with CT head which showed no acute abnormality.  Calcium level is high around 14.2 and patient appears dry and dehydrated.  Hospitalist service was consulted.  PAST MEDICAL HISTORY:   Past Medical History:  Diagnosis Date  . CHF (congestive heart failure) ([place])   . Chronic kidney disease (CKD), stage III (moderate) ([facility])   . Hypertension   chronic afib  PAST SURGICAL HISTORY:  Past Surgical History:  Procedure Laterality Date  . AMPUTATION Left 08/03/2018   Procedure: AMPUTATION RAY SECOND TOE;  Surgeon: Sharlotte Alamo, DPM;  Location: [facility];  Service: Podiatry;  Laterality: Left;  . HIP PINNING,CANNULATED Left 10/19/2018   Procedure: CANNULATED HIP PINNING;  Surgeon: Hessie Knows, MD;  Location: [facility];  Service: Orthopedics;  Laterality: Left;  . IRRIGATION AND DEBRIDEMENT FOOT Left 08/05/2018   Procedure: IRRIGATION AND DEBRIDEMENT FOOT;  Surgeon: Sharlotte Alamo, DPM;  Location: [facility];  Service: Podiatry;  Laterality: Left;  . TOE AMPUTATION      SOCIAL HISTORY:  Social History   Tobacco Use  . Smoking status: Current Every Day Smoker  . Smokeless tobacco: Never Used  Substance Use Topics  . Alcohol use: Never    Frequency:  Never    FAMILY HISTORY: No COPD diabetes cancer in the family  DRUG ALLERGIES: No Known Allergies  REVIEW OF SYSTEMS:   CONSTITUTIONAL: No fever, has fatigue and weakness.  EYES: No blurred or double vision.  EARS, NOSE, AND THROAT: No tinnitus or ear pain.  RESPIRATORY: No cough, shortness of breath, wheezing or hemoptysis.  CARDIOVASCULAR: No chest pain, orthopnea, edema.  GASTROINTESTINAL: No nausea, vomiting, diarrhea or abdominal pain.  GENITOURINARY: No dysuria, hematuria.  ENDOCRINE: No polyuria, nocturia,  HEMATOLOGY: No anemia, easy bruising or bleeding SKIN: No rash or lesion. MUSCULOSKELETAL: No joint pain or arthritis.   NEUROLOGIC: No tingling, numbness, weakness.  PSYCHIATRY: No anxiety or depression.   MEDICATIONS AT HOME:  Prior to Admission medications   Medication Sig Start Date End Date Taking? Authorizing Provider  apixaban (ELIQUIS) 5 MG TABS tablet Take 1 tablet (5 mg total) by mouth 2 (two) times daily. 08/09/18  Yes Dustin Flock, MD  aspirin EC 81 MG tablet Take 81 mg by mouth daily.   Yes [provider]  digoxin (LANOXIN) 0.25 MG tablet Take 0.25 mg by mouth daily.   Yes [provider]  docusate sodium (COLACE) 100 MG capsule Take 100 mg by mouth 2 (two) times daily.   Yes [provider]  finasteride (PROSCAR) 5 MG tablet Take 1 tablet (5 mg total) by mouth daily. 08/09/18  Yes Dustin Flock, MD  gabapentin (NEURONTIN) 300 MG capsule Take 300 mg by mouth 2 (two) times daily.  Yes [provider]  hydrOXYzine (ATARAX/VISTARIL) 25 MG tablet Take 25 mg by mouth every 8 (eight) hours as needed for itching.    Yes [provider]  loratadine (CLARITIN) 10 MG tablet Take 10 mg by mouth daily.   Yes [provider]  LORazepam (ATIVAN) 0.5 MG tablet Take 0.5 tablets (0.25 mg total) by mouth daily. Patient taking differently: Take 0.5 mg by mouth daily.  08/09/18  Yes Dustin Flock, MD  bisacodyl  (DULCOLAX) 5 MG EC tablet Take 1 tablet (5 mg total) by mouth daily as needed for moderate constipation. 10/21/18   Demetrios Loll, MD  metoprolol succinate (TOPROL-XL) 25 MG 24 hr tablet Take 0.5 tablets (12.5 mg total) by mouth daily. 10/10/18 11/09/18  Saundra Shelling, MD  senna (SENOKOT) 8.6 MG TABS tablet Take 1 tablet (8.6 mg total) by mouth daily as needed for mild constipation. 10/21/18   Demetrios Loll, MD      PHYSICAL EXAMINATION:   VITAL SIGNS: Blood pressure (!) 87/65, pulse 90, temperature 97.6 F (36.4 C), temperature source Oral, resp. rate 17, height 5\' 10"  (1.778 m), weight 85 kg, SpO2 99 %.  GENERAL:  63 y.o.-year-old patient lying in the bed with no acute distress.  EYES: Pupils equal, round, reactive to light and accommodation. No scleral icterus. Extraocular muscles intact.  HEENT: Head atraumatic, normocephalic. Oropharynx dry and nasopharynx clear.  NECK:  Supple, no jugular venous distention. No thyroid enlargement, no tenderness.  LUNGS: Normal breath sounds bilaterally, no wheezing, rales,rhonchi or crepitation. No use of accessory muscles of respiration.  CARDIOVASCULAR: S1, S2 normal. No murmurs, rubs, or gallops.  ABDOMEN: Soft, nontender, nondistended. Bowel sounds present. No organomegaly or mass.  EXTREMITIES: No pedal edema, cyanosis, or clubbing.  Left foot scar note Big toe has been amputated in the past NEUROLOGIC: Cranial nerves II through XII are intact. Muscle strength 5/5 in all extremities. Sensation intact. Gait not checked.  PSYCHIATRIC: The patient is alert and oriented x 3.  SKIN: No obvious rash, lesion, or ulcer.   LABORATORY PANEL:   CBC Recent Labs  Lab 11/10/18 1631  WBC 13.6*  HGB 10.4*  HCT 32.8*  PLT 519*  MCV 91.6  MCH 29.1  MCHC 31.7  RDW 14.6  LYMPHSABS 1.0  MONOABS 1.4*  EOSABS 0.1  BASOSABS 0.1   ------------------------------------------------------------------------------------------------------------------  Chemistries   Recent Labs  Lab 11/10/18 1631  NA 137  K 4.6  CL 108  CO2 19*  GLUCOSE 141*  BUN 55*  CREATININE 2.76*  CALCIUM 14.2*   ------------------------------------------------------------------------------------------------------------------ estimated creatinine clearance is 28.7 mL/min (A) (by C-G formula based on SCr of 2.76 mg/dL (H)). ------------------------------------------------------------------------------------------------------------------ No results for input(s): TSH, T4TOTAL, T3FREE, THYROIDAB in the last 72 hours.  Invalid input(s): FREET3   Coagulation profile No results for input(s): INR, PROTIME in the last 168 hours. ------------------------------------------------------------------------------------------------------------------- No results for input(s): DDIMER in the last 72 hours. -------------------------------------------------------------------------------------------------------------------  Cardiac Enzymes No results for input(s): CKMB, TROPONINI, MYOGLOBIN in the last 168 hours.  Invalid input(s): CK ------------------------------------------------------------------------------------------------------------------ Invalid input(s): POCBNP  ---------------------------------------------------------------------------------------------------------------  Urinalysis    Component Value Date/Time   COLORURINE YELLOW (A) 10/07/2018 0100   APPEARANCEUR TURBID (A) 10/07/2018 0100   LABSPEC 1.010 10/07/2018 0100   PHURINE 6.0 10/07/2018 0100   GLUCOSEU NEGATIVE 10/07/2018 0100   HGBUR MODERATE (A) 10/07/2018 0100   BILIRUBINUR NEGATIVE 10/07/2018 0100   KETONESUR NEGATIVE 10/07/2018 0100   PROTEINUR 100 (A) 10/07/2018 0100   NITRITE NEGATIVE 10/07/2018 0100   LEUKOCYTESUR MODERATE (  A) 10/07/2018 0100     RADIOLOGY: Ct Head Wo Contrast  Result Date: 11/10/2018 CLINICAL DATA:  Altered level of consciousness. EXAM: CT HEAD WITHOUT CONTRAST TECHNIQUE:  Contiguous axial images were obtained from the base of the skull through the vertex without intravenous contrast. COMPARISON:  None. FINDINGS: Brain: Mild atrophy and minimal small vessel disease. The brain demonstrates no evidence of hemorrhage, infarction, edema, mass effect, extra-axial fluid collection, hydrocephalus or mass lesion. Vascular: No hyperdense vessel or unexpected calcification. Skull: Normal. Negative for fracture or focal lesion. Sinuses/Orbits: No acute finding. Other: None. IMPRESSION: Mild atrophy and small vessel disease.  No acute findings. Electronically Signed   By: Aletta Edouard M.D.   On: 11/10/2018 17:55   Dg Chest Portable 1 View  Result Date: 11/10/2018 CLINICAL DATA:  Shortness of breath for the past 2-3 days. Smoker. EXAM: PORTABLE CHEST 1 VIEW COMPARISON:  10/19/2018. FINDINGS: Normal sized heart. Clear lungs with normal vascularity. Small calcified granuloma in the right lower lung zone. Unremarkable bones. IMPRESSION: No acute abnormality. Electronically Signed   By: Claudie Revering M.D.   On: 11/10/2018 17:11    EKG: Orders placed or performed during the hospital encounter of 11/10/18  . EKG 12-Lead  . EKG 12-Lead    IMPRESSION AND PLAN: 63 year old male patient with a known history of hip fracture, chronic congestive heart failure, CKD stage III, hypertension, chronic afib on eliquis was referred from group home.  Patient recently had hip fracture which was repaired and sent to rehab.  Day ago patient was sent from rehab group home.  He has generalized weakness.    -Acute hypercalcemia IV fluid hydration with normal saline Follow-up calcium levels  -Dehydration IV fluids  -Hypo-tension secondary to dehydration IV fluids and recheck blood pressure  -History of hip fracture status post ORIF Physical therapy to continue  -Acute on chronic kidney disease stage III IV fluids Avoid nephrotoxic meds Monitor renal function  -DVT prophylaxis On  anticoagulation with Eliquis  -Chronic Afib Continue eliquis for anticoagulation  -Tobacco abuse Tobacco cessation counseled for six minutes Nicotine patch offered, but patient does not want it.  -Covid 19 test pending  All the records are reviewed and case discussed with ED provider. Management plans discussed with the patient, family and they are in agreement.  CODE STATUS:Full code Code Status History    Date Active Date Inactive Code Status Order ID Comments User Context   10/19/2018 0114 10/21/2018 2305 Full Code [id number]  Lance Coon, MD ED   10/07/2018 0442 10/11/2018 1817 Full Code [id number]  Mayer Camel, NP ED   08/02/2018 1502 08/09/2018 1743 Full Code [id number]  Mayo, Pete Pelt, MD Inpatient   Advance Care Planning Activity      TOTAL TIME TAKING CARE OF THIS PATIENT: 52 minutes.    Saundra Shelling M.D on 11/10/2018 at 6:37 PM  Between 7am to 6pm - Pager - [phone]  After 6pm go to [email or web address] - password [email or web address] [facility]  Office  [phone]  CC: Primary care physician; System, Pcp Not In

## 2018-11-10 NOTE — ED Notes (Signed)
This RN has explained pt the importance of keeping monitor equipment on. Pt keep removing cardia leads, BP cuff and pulse ox at  this time.

## 2018-11-10 NOTE — ED Triage Notes (Signed)
Pt comes EMS from [place] after being d/c from [facility]. Pt recently had left hip fracture and had sx here. Pt d/c to [facility] for rehab then they determined he could go home and sent him to group home with premise that he could walk but when he got there he could not so they sent him here. Pt now c/o some SOB but is AOx4.

## 2018-11-10 NOTE — ED Notes (Signed)
Patient transported to CT 

## 2018-11-10 NOTE — ED Notes (Signed)
Pt denies CP/dizziness at this time. Pt st "I justfeel weak"

## 2018-11-11 ENCOUNTER — Inpatient Hospital Stay: Payer: Medicare HMO

## 2018-11-11 ENCOUNTER — Inpatient Hospital Stay: Payer: Medicare HMO | Attending: Oncology | Admitting: Oncology

## 2018-11-11 DIAGNOSIS — Z7901 Long term (current) use of anticoagulants: Secondary | ICD-10-CM

## 2018-11-11 DIAGNOSIS — I509 Heart failure, unspecified: Secondary | ICD-10-CM

## 2018-11-11 DIAGNOSIS — K769 Liver disease, unspecified: Secondary | ICD-10-CM

## 2018-11-11 DIAGNOSIS — R634 Abnormal weight loss: Secondary | ICD-10-CM

## 2018-11-11 DIAGNOSIS — F17210 Nicotine dependence, cigarettes, uncomplicated: Secondary | ICD-10-CM

## 2018-11-11 DIAGNOSIS — D649 Anemia, unspecified: Secondary | ICD-10-CM

## 2018-11-11 DIAGNOSIS — Z7982 Long term (current) use of aspirin: Secondary | ICD-10-CM

## 2018-11-11 DIAGNOSIS — N183 Chronic kidney disease, stage 3 (moderate): Secondary | ICD-10-CM

## 2018-11-11 DIAGNOSIS — I4891 Unspecified atrial fibrillation: Secondary | ICD-10-CM

## 2018-11-11 DIAGNOSIS — R531 Weakness: Secondary | ICD-10-CM

## 2018-11-11 DIAGNOSIS — Z79899 Other long term (current) drug therapy: Secondary | ICD-10-CM

## 2018-11-11 DIAGNOSIS — I130 Hypertensive heart and chronic kidney disease with heart failure and stage 1 through stage 4 chronic kidney disease, or unspecified chronic kidney disease: Secondary | ICD-10-CM

## 2018-11-11 DIAGNOSIS — C673 Malignant neoplasm of anterior wall of bladder: Secondary | ICD-10-CM

## 2018-11-11 LAB — URINALYSIS, COMPLETE (UACMP) WITH MICROSCOPIC
Bacteria, UA: NONE SEEN
Bilirubin Urine: NEGATIVE
Glucose, UA: NEGATIVE mg/dL
Ketones, ur: NEGATIVE mg/dL
Nitrite: NEGATIVE
Protein, ur: 100 mg/dL — AB
Specific Gravity, Urine: 1.011 (ref 1.005–1.030)
WBC, UA: >50 WBC/hpf — ABNORMAL HIGH (ref 0–5)
pH: 6 (ref 5.0–8.0)

## 2018-11-11 LAB — CBC
HCT: 30.6 % — ABNORMAL LOW (ref 39.0–52.0)
Hemoglobin: 9.6 g/dL — ABNORMAL LOW (ref 13.0–17.0)
MCH: 29.3 pg (ref 26.0–34.0)
MCHC: 31.4 g/dL (ref 30.0–36.0)
MCV: 93.3 fL (ref 80.0–100.0)
Platelets: 437 10*3/uL — ABNORMAL HIGH (ref 150–400)
RBC: 3.28 MIL/uL — ABNORMAL LOW (ref 4.22–5.81)
RDW: 14.6 % (ref 11.5–15.5)
WBC: 14.2 10*3/uL — ABNORMAL HIGH (ref 4.0–10.5)
nRBC: 0 % (ref 0.0–0.2)

## 2018-11-11 LAB — BASIC METABOLIC PANEL
Anion gap: 9 (ref 5–15)
BUN: 54 mg/dL — ABNORMAL HIGH (ref 8–23)
CO2: 21 mmol/L — ABNORMAL LOW (ref 22–32)
Calcium: 13.1 mg/dL (ref 8.9–10.3)
Chloride: 109 mmol/L (ref 98–111)
Creatinine, Ser: 2.67 mg/dL — ABNORMAL HIGH (ref 0.61–1.24)
GFR calc Af Amer: 28 mL/min — ABNORMAL LOW (ref >60)
GFR calc non Af Amer: 24 mL/min — ABNORMAL LOW (ref >60)
Glucose, Bld: 93 mg/dL (ref 70–99)
Potassium: 4.6 mmol/L (ref 3.5–5.1)
Sodium: 139 mmol/L (ref 135–145)

## 2018-11-11 LAB — TSH: TSH: 1.728 u[IU]/mL (ref 0.350–4.500)

## 2018-11-11 LAB — PROTEIN / CREATININE RATIO, URINE
Creatinine, Urine: 53 mg/dL
Protein Creatinine Ratio: 3.62 mg/mg{Cre} — ABNORMAL HIGH (ref 0.00–0.15)
Total Protein, Urine: 192 mg/dL

## 2018-11-11 LAB — PHOSPHORUS: Phosphorus: 3.5 mg/dL (ref 2.5–4.6)

## 2018-11-11 MED ORDER — CHLORHEXIDINE GLUCONATE CLOTH 2 % EX PADS
6.0000 | MEDICATED_PAD | Freq: Every day | CUTANEOUS | Status: AC
  Administered 2018-11-13 – 2018-11-15 (×3): 6 via TOPICAL

## 2018-11-11 MED ORDER — MUPIROCIN 2 % EX OINT
1.0000 "application " | TOPICAL_OINTMENT | Freq: Two times a day (BID) | CUTANEOUS | Status: AC
  Administered 2018-11-12 – 2018-11-16 (×8): 1 via NASAL
  Filled 2018-11-11: qty 22

## 2018-11-11 NOTE — Consult Note (Signed)
[address] [place], [place] [address] Phone [phone]. FAx [phone]  Date: 11/11/2018                  Patient Name:  Ruben Clark  MRN: [id number]  DOB: July 07, 1955  Age / Sex: 63 y.o., male         PCP: System, Pcp Not In                 Service Requesting Consult: IM/ Max Sane, MD                 Reason for Consult: Hypercalcemia            History of Present Illness: Patient is a 63 y.o. male with medical problems of ch sys CHF, CKD, A Fib, HTN who is a resident of group home is admitted to [facility] on 11/10/2018 for evaluation of weakness, weight loss. Had left femoral neck fracture in 10/2018 Baseline labs from 7/5 show Cr 1.24, GFR > 60, CA 10.3 Now presents with  AKI with Cr 2.76 --> 2.67 Ca 14.2--> 13.1 U/a from 7/2= turbid, mod blood, RBCs and > 50 WBC; urine Cx = MRSA      Medications: Outpatient medications: Medications Prior to Admission  Medication Sig Dispense Refill Last Dose  . apixaban (ELIQUIS) 5 MG TABS tablet Take 1 tablet (5 mg total) by mouth 2 (two) times daily. 60 tablet  11/10/2018 at 0800  . aspirin EC 81 MG tablet Take 81 mg by mouth daily.   11/10/2018 at 0800  . digoxin (LANOXIN) 0.25 MG tablet Take 0.25 mg by mouth daily.   11/10/2018 at 0800  . docusate sodium (COLACE) 100 MG capsule Take 100 mg by mouth 2 (two) times daily.   11/10/2018 at 0800  . finasteride (PROSCAR) 5 MG tablet Take 1 tablet (5 mg total) by mouth daily.   11/10/2018 at 0800  . gabapentin (NEURONTIN) 300 MG capsule Take 300 mg by mouth 2 (two) times daily.    11/10/2018 at 0800  . hydrOXYzine (ATARAX/VISTARIL) 25 MG tablet Take 25 mg by mouth every 8 (eight) hours as needed for itching.    11/10/2018 at 0800  . loratadine (CLARITIN) 10 MG tablet Take 10 mg by mouth daily.   11/10/2018 at 0800  . LORazepam (ATIVAN) 0.5 MG tablet Take 0.5 tablets (0.25 mg total) by mouth daily. (Patient taking differently: Take 0.5 mg by mouth daily. ) 30 tablet 0 11/10/2018 at 0800  . bisacodyl  (DULCOLAX) 5 MG EC tablet Take 1 tablet (5 mg total) by mouth daily as needed for moderate constipation. 30 tablet 0 prn at prn  . metoprolol succinate (TOPROL-XL) 25 MG 24 hr tablet Take 0.5 tablets (12.5 mg total) by mouth daily. 15 tablet 0   . senna (SENOKOT) 8.6 MG TABS tablet Take 1 tablet (8.6 mg total) by mouth daily as needed for mild constipation. 120 tablet 0 prn at prn    Current medications: Current Facility-Administered Medications  Medication Dose Route Frequency Provider Last Rate Last Dose  . 0.9 %  sodium chloride infusion   Intravenous Continuous Saundra Shelling, MD 125 mL/hr at 11/11/18 9485    . acetaminophen (TYLENOL) tablet 650 mg  650 mg Oral Q6H PRN Saundra Shelling, MD       Or  . acetaminophen (TYLENOL) suppository 650 mg  650 mg Rectal Q6H PRN Pyreddy, Pavan, MD      . apixaban (ELIQUIS) tablet 5 mg  5 mg Oral BID  Saundra Shelling, MD   5 mg at 11/11/18 0926  . aspirin EC tablet 81 mg  81 mg Oral Daily Saundra Shelling, MD   81 mg at 11/11/18 0926  . bisacodyl (DULCOLAX) EC tablet 5 mg  5 mg Oral Daily PRN Saundra Shelling, MD      . digoxin (LANOXIN) tablet 0.25 mg  0.25 mg Oral Daily Pyreddy, Pavan, MD   0.25 mg at 11/11/18 0926  . docusate sodium (COLACE) capsule 100 mg  100 mg Oral BID Saundra Shelling, MD   100 mg at 11/11/18 0926  . finasteride (PROSCAR) tablet 5 mg  5 mg Oral Daily Pyreddy, Reatha Harps, MD   5 mg at 11/11/18 0925  . gabapentin (NEURONTIN) capsule 300 mg  300 mg Oral BID Saundra Shelling, MD   300 mg at 11/11/18 0973  . hydrOXYzine (ATARAX/VISTARIL) tablet 25 mg  25 mg Oral Q8H PRN Saundra Shelling, MD      . loratadine (CLARITIN) tablet 10 mg  10 mg Oral Daily Pyreddy, Reatha Harps, MD   10 mg at 11/11/18 0926  . LORazepam (ATIVAN) tablet 0.5 mg  0.5 mg Oral Daily Pyreddy, Pavan, MD   0.5 mg at 11/11/18 0926  . metoprolol succinate (TOPROL-XL) 24 hr tablet 12.5 mg  12.5 mg Oral Daily Pyreddy, Pavan, MD   12.5 mg at 11/11/18 0926  . ondansetron (ZOFRAN) tablet 4 mg  4 mg  Oral Q6H PRN Pyreddy, Reatha Harps, MD       Or  . ondansetron (ZOFRAN) injection 4 mg  4 mg Intravenous Q6H PRN Pyreddy, Pavan, MD      . senna (SENOKOT) tablet 8.6 mg  1 tablet Oral Daily PRN Saundra Shelling, MD          Allergies: No Known Allergies    Past Medical History: Past Medical History:  Diagnosis Date  . CHF (congestive heart failure) ([facility])   . Chronic kidney disease (CKD), stage III (moderate) ([facility])   . Hypertension      Past Surgical History: Past Surgical History:  Procedure Laterality Date  . AMPUTATION Left 08/03/2018   Procedure: AMPUTATION RAY SECOND TOE;  Surgeon: Sharlotte Alamo, DPM;  Location: [facility] ORS;  Service: Podiatry;  Laterality: Left;  . HIP PINNING,CANNULATED Left 10/19/2018   Procedure: CANNULATED HIP PINNING;  Surgeon: Hessie Knows, MD;  Location: [facility] ORS;  Service: Orthopedics;  Laterality: Left;  . IRRIGATION AND DEBRIDEMENT FOOT Left 08/05/2018   Procedure: IRRIGATION AND DEBRIDEMENT FOOT;  Surgeon: Sharlotte Alamo, DPM;  Location: [facility] ORS;  Service: Podiatry;  Laterality: Left;  . TOE AMPUTATION       Family History: History reviewed. No pertinent family history.   Social History: Social History   Socioeconomic History  . Marital status: Single    Spouse name: Not on file  . Number of children: Not on file  . Years of education: Not on file  . Highest education level: Not on file  Occupational History  . Not on file  Social Needs  . Financial resource strain: Not on file  . Food insecurity    Worry: Not on file    Inability: Not on file  . Transportation needs    Medical: Not on file    Non-medical: Not on file  Tobacco Use  . Smoking status: Current Every Day Smoker  . Smokeless tobacco: Never Used  Substance and Sexual Activity  . Alcohol use: Never    Frequency: Never  . Drug use: Never  . Sexual activity: Not on  file  Lifestyle  . Physical activity    Days per week: Not on file    Minutes per session: Not on file  . Stress:  Not on file  Relationships  . Social Herbalist on phone: Not on file    Gets together: Not on file    Attends religious service: Not on file    Active member of club or organization: Not on file    Attends meetings of clubs or organizations: Not on file    Relationship status: Not on file  . Intimate partner violence    Fear of current or ex partner: Not on file    Emotionally abused: Not on file    Physically abused: Not on file    Forced sexual activity: Not on file  Other Topics Concern  . Not on file  Social History Narrative  . Not on file     Review of Systems: unreliable    Vital Signs: Blood pressure (!) 107/50, pulse 77, temperature 98.9 F (37.2 C), temperature source Oral, resp. rate 16, height 5\' 10"  (1.778 m), weight 85 kg, SpO2 99 %.   Intake/Output Summary (Last 24 hours) at 11/11/2018 1435 Last data filed at 11/11/2018 1003 Gross per 24 hour  Intake 738.59 ml  Output -  Net 738.59 ml    Weight trends: [employer]   11/10/18 1625  Weight: 85 kg   Gen:   Alert, cooperative, no distress, appears older than stated age, cachetic Head:   Significant temporal wasting Eyes/ENT:  conjunctiva/corneas clear,  moist oral mucus membranes Neck:  Supple,  thyroid: not enlarged, no JVD Back:    no CVA tenderness Lungs:   Clear to auscultation bilaterally, respirations unlabored Heart:   Regular rate and rhythm, S1, S2 normal, no gallop Abdomen:   Soft, non-tender, bowel sounds active  Extremities: no cyanosis or edema Skin:  Skin color, texture, turgor normal, no rashes or lesions Neurologic: Alert, oriented to self, able to answer some questions    Lab results: Basic Metabolic Panel: Recent Labs  Lab 11/10/18 1631 11/10/18 1840 11/11/18 0539  NA 137  --  139  K 4.6  --  4.6  CL 108  --  109  CO2 19*  --  21*  GLUCOSE 141*  --  93  BUN 55*  --  54*  CREATININE 2.76*  --  2.67*  CALCIUM 14.2*  --  13.1*  MG  --  2.4  --     Liver  Function Tests: No results for input(s): AST, ALT, ALKPHOS, BILITOT, PROT, ALBUMIN in the last 168 hours. No results for input(s): LIPASE, AMYLASE in the last 168 hours. No results for input(s): AMMONIA in the last 168 hours.  CBC: Recent Labs  Lab 11/10/18 1631 11/11/18 0539  WBC 13.6* 14.2*  NEUTROABS 11.0*  --   HGB 10.4* 9.6*  HCT 32.8* 30.6*  MCV 91.6 93.3  PLT 519* 437*    Cardiac Enzymes: No results for input(s): CKTOTAL, TROPONINI in the last 168 hours.  BNP: Invalid input(s): POCBNP  CBG: No results for input(s): GLUCAP in the last 168 hours.  Microbiology: Recent Results (from the past 720 hour(s))  SARS Coronavirus 2 (CEPHEID - Performed in [place] hospital lab), Hosp Order     Status: None   Collection Time: 10/18/18 11:46 PM   Specimen: Nasopharyngeal Swab  Result Value Ref Range Status   SARS Coronavirus 2 NEGATIVE NEGATIVE Final    Comment: (NOTE)  If result is NEGATIVE SARS-CoV-2 target nucleic acids are NOT DETECTED. The SARS-CoV-2 RNA is generally detectable in upper and lower  respiratory specimens during the acute phase of infection. The lowest  concentration of SARS-CoV-2 viral copies this assay can detect is 250  copies / mL. A negative result does not preclude SARS-CoV-2 infection  and should not be used as the sole basis for treatment or other  patient management decisions.  A negative result may occur with  improper specimen collection / handling, submission of specimen other  than nasopharyngeal swab, presence of viral mutation(s) within the  areas targeted by this assay, and inadequate number of viral copies  (<250 copies / mL). A negative result must be combined with clinical  observations, patient history, and epidemiological information. If result is POSITIVE SARS-CoV-2 target nucleic acids are DETECTED. The SARS-CoV-2 RNA is generally detectable in upper and lower  respiratory specimens dur ing the acute phase of infection.   Positive  results are indicative of active infection with SARS-CoV-2.  Clinical  correlation with patient history and other diagnostic information is  necessary to determine patient infection status.  Positive results do  not rule out bacterial infection or co-infection with other viruses. If result is PRESUMPTIVE POSTIVE SARS-CoV-2 nucleic acids MAY BE PRESENT.   A presumptive positive result was obtained on the submitted specimen  and confirmed on repeat testing.  While 2019 novel coronavirus  (SARS-CoV-2) nucleic acids may be present in the submitted sample  additional confirmatory testing may be necessary for epidemiological  and / or clinical management purposes  to differentiate between  SARS-CoV-2 and other Sarbecovirus currently known to infect humans.  If clinically indicated additional testing with an alternate test  methodology [phone]) is advised. The SARS-CoV-2 RNA is generally  detectable in upper and lower respiratory sp ecimens during the acute  phase of infection. The expected result is Negative. Fact Sheet for Patients:  StrictlyIdeas.no Fact Sheet for Healthcare Providers: BankingDealers.co.za This test is not yet approved or cleared by the [place] FDA and has been authorized for detection and/or diagnosis of SARS-CoV-2 by FDA under an Emergency Use Authorization (EUA).  This EUA will remain in effect (meaning this test can be used) for the duration of the COVID-19 declaration under Section 564(b)(1) of the Act, 21 U.S.C. section 360bbb-3(b)(1), unless the authorization is terminated or revoked sooner. Performed at [facility], [address] [place], [place] [address]   MRSA PCR Screening     Status: Abnormal   Collection Time: 10/19/18  2:03 AM   Specimen: Nasopharyngeal  Result Value Ref Range Status   MRSA by PCR POSITIVE (A) NEGATIVE Final    Comment:        The GeneXpert MRSA Assay  (FDA approved for NASAL specimens only), is one component of a comprehensive MRSA colonization surveillance program. It is not intended to diagnose MRSA infection nor to guide or monitor treatment for MRSA infections. RESULT CALLED TO, READ BACK BY AND VERIFIED WITH: NIKKI SOLOMON ON 10/19/18 AT [address] [facility] Performed at [facility], [address] [place], [place] [address]   SARS Coronavirus 2 [facility] order, Performed in [facility] hospital lab) Nasopharyngeal Nasopharyngeal Swab     Status: None   Collection Time: 11/10/18  6:40 PM   Specimen: Nasopharyngeal Swab  Result Value Ref Range Status   SARS Coronavirus 2 NEGATIVE NEGATIVE Final    Comment: (NOTE) If result is NEGATIVE SARS-CoV-2 target nucleic acids are NOT DETECTED. The SARS-CoV-2  RNA is generally detectable in upper and lower  respiratory specimens during the acute phase of infection. The lowest  concentration of SARS-CoV-2 viral copies this assay can detect is 250  copies / mL. A negative result does not preclude SARS-CoV-2 infection  and should not be used as the sole basis for treatment or other  patient management decisions.  A negative result may occur with  improper specimen collection / handling, submission of specimen other  than nasopharyngeal swab, presence of viral mutation(s) within the  areas targeted by this assay, and inadequate number of viral copies  (<250 copies / mL). A negative result must be combined with clinical  observations, patient history, and epidemiological information. If result is POSITIVE SARS-CoV-2 target nucleic acids are DETECTED. The SARS-CoV-2 RNA is generally detectable in upper and lower  respiratory specimens dur ing the acute phase of infection.  Positive  results are indicative of active infection with SARS-CoV-2.  Clinical  correlation with patient history and other diagnostic information is  necessary to determine patient infection status.  Positive results do   not rule out bacterial infection or co-infection with other viruses. If result is PRESUMPTIVE POSTIVE SARS-CoV-2 nucleic acids MAY BE PRESENT.   A presumptive positive result was obtained on the submitted specimen  and confirmed on repeat testing.  While 2019 novel coronavirus  (SARS-CoV-2) nucleic acids may be present in the submitted sample  additional confirmatory testing may be necessary for epidemiological  and / or clinical management purposes  to differentiate between  SARS-CoV-2 and other Sarbecovirus currently known to infect humans.  If clinically indicated additional testing with an alternate test  methodology [phone]) is advised. The SARS-CoV-2 RNA is generally  detectable in upper and lower respiratory sp ecimens during the acute  phase of infection. The expected result is Negative. Fact Sheet for Patients:  StrictlyIdeas.no Fact Sheet for Healthcare Providers: BankingDealers.co.za This test is not yet approved or cleared by the [place] FDA and has been authorized for detection and/or diagnosis of SARS-CoV-2 by FDA under an Emergency Use Authorization (EUA).  This EUA will remain in effect (meaning this test can be used) for the duration of the COVID-19 declaration under Section 564(b)(1) of the Act, 21 U.S.C. section 360bbb-3(b)(1), unless the authorization is terminated or revoked sooner. Performed at [facility], [address] [place], [place] [address]      Coagulation Studies: No results for input(s): LABPROT, INR in the last 72 hours.  Urinalysis: No results for input(s): COLORURINE, LABSPEC, PHURINE, GLUCOSEU, HGBUR, BILIRUBINUR, KETONESUR, PROTEINUR, UROBILINOGEN, NITRITE, LEUKOCYTESUR in the last 72 hours.  Invalid input(s): APPERANCEUR      Imaging: Ct Head Wo Contrast  Result Date: 11/10/2018 CLINICAL DATA:  Altered level of consciousness. EXAM: CT HEAD WITHOUT CONTRAST TECHNIQUE:  Contiguous axial images were obtained from the base of the skull through the vertex without intravenous contrast. COMPARISON:  None. FINDINGS: Brain: Mild atrophy and minimal small vessel disease. The brain demonstrates no evidence of hemorrhage, infarction, edema, mass effect, extra-axial fluid collection, hydrocephalus or mass lesion. Vascular: No hyperdense vessel or unexpected calcification. Skull: Normal. Negative for fracture or focal lesion. Sinuses/Orbits: No acute finding. Other: None. IMPRESSION: Mild atrophy and small vessel disease.  No acute findings. Electronically Signed   By: Aletta Edouard M.D.   On: 11/10/2018 17:55   Dg Chest Portable 1 View  Result Date: 11/10/2018 CLINICAL DATA:  Shortness of breath for the past 2-3 days. Smoker. EXAM: PORTABLE CHEST 1 VIEW COMPARISON:  10/19/2018. FINDINGS: Normal sized heart. Clear lungs with normal vascularity. Small calcified granuloma in the right lower lung zone. Unremarkable bones. IMPRESSION: No acute abnormality. Electronically Signed   By: Claudie Revering M.D.   On: 11/10/2018 17:11      Assessment & Plan: Pt is a 63 y.o. caucsian  male with with medical problems of ch sys CHF, CKD, A Fib, HTN who is a resident of group home is admitted to [facility] on 11/10/2018 for evaluation of weakness, weight loss. Had left femoral neck fracture in 10/2018  1. AKI 2. Hypercalcemia 3. Weight loss, cachexia 4. Anemia  Will obtain renal u/s, u/a Screening serologies including ANA, complements, TSH, HIV Continue iv saline Obtain PTH If weight loss is truly chronic, there is concern for malignancy     LOS: 1 Oryn Casanova 8/6/20202:35 PM    Note: This note was prepared with Dragon dictation. Any transcription errors are unintentional

## 2018-11-11 NOTE — Plan of Care (Signed)
PMT note:  Patient not in his room at time of visit. Will re-attmept at another time.

## 2018-11-11 NOTE — Care Management Important Message (Signed)
Important Message  Patient Details  Name: Kron Everton MRN: [id number] Date of Birth: 31-Aug-1955   Medicare Important Message Given:  Yes  Initial Medicare IM given by Patient Access Associate on 11/10/2018 at 6:46pm.  Still valid.    Dannette Barbara 11/11/2018, 4:50 PM

## 2018-11-11 NOTE — Progress Notes (Signed)
PT Cancellation Note  Patient Details Name: Justo Hengel MRN: [id number] DOB: 15-Sep-1955   Cancelled Treatment:    Reason Eval/Treat Not Completed: Medical issues which prohibited therapy(Consult received and chart reviewed.  Patient calcium levels critically elevated; contraindicated for exertional activity at this time.  Will continue to follow and initiate as medically appropriate.)   Vashon Arch H. Owens Shark, PT, DPT, NCS 11/11/18, 11:04 AM [phone]

## 2018-11-11 NOTE — Progress Notes (Signed)
Pt's AM calcium 13.1, down from 14.2 on admit. Notified MD Marcille Blanco, no new orders received at this time. Nursing staff will continue to monitor for any changes in patient status.  Earleen Reaper, RN

## 2018-11-11 NOTE — Consult Note (Signed)
[facility]  Date of admission:  11/10/2018  Inpatient day:  11/11/2018  Consulting physician:  Dr Max Sane  Reason for Consultation: significant weight loss, hypercalcemia -worrisome for underlying malignancy?  Chief Complaint: Ruben Clark is a 63 y.o. male with stage III chronic kidney disease, CHF, and atrial fibrillation on Eliquis who was admitted through the emergency room with weakness and a calcium of 14.2.  HPI:  The patient was admitted to [facility] from 10/18/2018 - 10/21/2018 with a left femoral neck fracture.  He underwent cannulated hip pinning by Dr. Rudene Christians on 10/19/2018.  He was discharged to rehab.  Labs on 10/18/2018 included a hematocrit of 39.2, hemoglobin 12.5, MCV 91.8, platelets 403,000, white count 21,300 with an ANC of 18,400.  Additional labs included a creatinine of BUN of 49, creatinine 1.87, calcium 11.4 (corrected 12.08), albumin 3.2, protein 7.4, and normal liver function tests.  Labs on 10/21/2018 included a BUN of 34, creatinine 1.62 and calcium 11.1.  Review of labs dating back to 08/02/2018 confirmed persistent hypercalcemia.  Calcium has ranged between 10.0 - 12.4 prior to this admission.  Labs on 11/10/2018 included a BUN of 51, creatinine 2.76, calcium 14.2.  CBC revealed a hematocrit 32.8, hemoglobin 10.4, MCV 91.6, platelets 519,000, white count 13,600 with an ANC of 11,000.  Labs today include a creatinine of 2.67 and calcium of 13.1.  Review of prior weights include the following: 88.5 kg on 08/26/2018, 72.6 kg on 10/06/2018 and 84.4 kg on 10/18/2018.  Patient is a poor historian.  He denies any symptoms.  He denies any weight loss.   Past Medical History:  Diagnosis Date  . CHF (congestive heart failure) ([place])   . Chronic kidney disease (CKD), stage III (moderate) ([facility])   . Hypertension     Past Surgical History:  Procedure Laterality Date  . AMPUTATION Left 08/03/2018   Procedure: AMPUTATION RAY SECOND TOE;  Surgeon: Sharlotte Alamo, DPM;  Location: [facility] ORS;  Service: Podiatry;  Laterality: Left;  . HIP PINNING,CANNULATED Left 10/19/2018   Procedure: CANNULATED HIP PINNING;  Surgeon: Hessie Knows, MD;  Location: [facility] ORS;  Service: Orthopedics;  Laterality: Left;  . IRRIGATION AND DEBRIDEMENT FOOT Left 08/05/2018   Procedure: IRRIGATION AND DEBRIDEMENT FOOT;  Surgeon: Sharlotte Alamo, DPM;  Location: [facility] ORS;  Service: Podiatry;  Laterality: Left;  . TOE AMPUTATION      History reviewed. No pertinent family history.  Social History:  reports that he has been smoking. He has never used smokeless tobacco. He reports that he does not drink alcohol or use drugs.  He denies any exposure to radiation or toxins.  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  The patient states that he has lived in a group home for about 13 months.  His parents are deceased.  He has no siblings, wife or children.  He has a guardian.  Allergies: No Known Allergies  Medications Prior to Admission  Medication Sig Dispense Refill  . apixaban (ELIQUIS) 5 MG TABS tablet Take 1 tablet (5 mg total) by mouth 2 (two) times daily. 60 tablet   . aspirin EC 81 MG tablet Take 81 mg by mouth daily.    . digoxin (LANOXIN) 0.25 MG tablet Take 0.25 mg by mouth daily.    Marland Kitchen docusate sodium (COLACE) 100 MG capsule Take 100 mg by mouth 2 (two) times daily.    . finasteride (PROSCAR) 5 MG tablet Take 1 tablet (5 mg total) by mouth daily.    Marland Kitchen  gabapentin (NEURONTIN) 300 MG capsule Take 300 mg by mouth 2 (two) times daily.     . hydrOXYzine (ATARAX/VISTARIL) 25 MG tablet Take 25 mg by mouth every 8 (eight) hours as needed for itching.     . loratadine (CLARITIN) 10 MG tablet Take 10 mg by mouth daily.    Marland Kitchen LORazepam (ATIVAN) 0.5 MG tablet Take 0.5 tablets (0.25 mg total) by mouth daily. (Patient taking differently: Take 0.5 mg by mouth daily. ) 30 tablet 0  . bisacodyl (DULCOLAX) 5 MG EC tablet Take 1 tablet (5 mg total) by mouth daily as needed for moderate  constipation. 30 tablet 0  . metoprolol succinate (TOPROL-XL) 25 MG 24 hr tablet Take 0.5 tablets (12.5 mg total) by mouth daily. 15 tablet 0  . senna (SENOKOT) 8.6 MG TABS tablet Take 1 tablet (8.6 mg total) by mouth daily as needed for mild constipation. 120 tablet 0    Review of Systems: GENERAL:  Patient denies any complaints.  No fevers, sweats or weight loss. PERFORMANCE STATUS (ECOG):  2-3 HEENT:  No visual changes, runny nose, sore throat, mouth sores or tenderness. Lungs: No shortness of breath or cough.  No hemoptysis. Cardiac:  No chest pain, palpitations, orthopnea, or PND. GI:  No nausea, vomiting, diarrhea, constipation, melena or hematochezia. GU:  No urgency, frequency, dysuria, or hematuria. Musculoskeletal:  No back pain.  No joint pain.  No muscle tenderness. Extremities:  No pain or swelling. Skin:  No rashes or skin changes. Neuro:  No headache, numbness or weakness, balance or coordination issues. Endocrine:  No diabetes, thyroid issues, hot flashes or night sweats. Psych:  No mood changes, depression or anxiety. Pain:  No focal pain. Review of systems:  All other systems reviewed and found to be negative.  Physical Exam:  Blood pressure (!) 107/50, pulse 77, temperature 98.9 F (37.2 C), temperature source Oral, resp. rate 16, height 5' 10"  (1.778 m), weight 187 lb 6.3 oz (85 kg), SpO2 99 %.  GENERAL:  Chronically fatigued appearing thin gentleman lying comfortably on the medical unit in no acute distress. MENTAL STATUS:  Alert and oriented to person and place. HEAD:  Short black hair.  Scruffy gray facial hair.  Temporal wasting.  Normocephalic, atraumatic, face symmetric, no Cushingoid features. EYES:  Glasses.  Pupils equal round and reactive to light and accomodation.  No conjunctivitis or scleral icterus. ENT:  Limited view of mouth.  Oropharynx clear without apparent lesion.  Tongue dry.  RESPIRATORY:  Clear to auscultation anteriorly without rales, wheezes  or rhonchi. CARDIOVASCULAR:  Regular rate and rhythm without murmur, rub or gallop. ABDOMEN:  Soft, non-tender, with active bowel sounds, and no hepatosplenomegaly.  Suprapubic mass/fullness palpable to 2 FB below umbilicus. SKIN:  No rashes, ulcers or lesions. EXTREMITIES:  Thin.  No edema, no skin discoloration or tenderness.  No palpable cords. LYMPH NODES: No palpable cervical, supraclavicular, axillary or inguinal adenopathy  NEUROLOGICAL: Unremarkable.  Moves all 4 extremities.  Follows commands. PSYCH:  Appropriate.   Results for orders placed or performed during the hospital encounter of 11/10/18 (from the past 48 hour(s))  Brain natriuretic peptide     Status: None   Collection Time: 11/10/18  4:29 PM  Result Value Ref Range   B Natriuretic Peptide 81.0 0.0 - 100.0 pg/mL    Comment: Performed at [facility], [address] [place], [place] [address]  CBC with Differential     Status: Abnormal   Collection Time: 11/10/18  4:31 PM  Result Value Ref Range   WBC 13.6 (H) 4.0 - 10.5 K/uL   RBC 3.58 (L) 4.22 - 5.81 MIL/uL   Hemoglobin 10.4 (L) 13.0 - 17.0 g/dL   HCT 32.8 (L) 39.0 - 52.0 %   MCV 91.6 80.0 - 100.0 fL   MCH 29.1 26.0 - 34.0 pg   MCHC 31.7 30.0 - 36.0 g/dL   RDW 14.6 11.5 - 15.5 %   Platelets 519 (H) 150 - 400 K/uL   nRBC 0.0 0.0 - 0.2 %   Neutrophils Relative % 81 %   Neutro Abs 11.0 (H) 1.7 - 7.7 K/uL   Lymphocytes Relative 7 %   Lymphs Abs 1.0 0.7 - 4.0 K/uL   Monocytes Relative 10 %   Monocytes Absolute 1.4 (H) 0.1 - 1.0 K/uL   Eosinophils Relative 1 %   Eosinophils Absolute 0.1 0.0 - 0.5 K/uL   Basophils Relative 1 %   Basophils Absolute 0.1 0.0 - 0.1 K/uL   Immature Granulocytes 0 %   Abs Immature Granulocytes 0.05 0.00 - 0.07 K/uL    Comment: Performed at [facility], [address] [place], [place] [address]  Basic metabolic panel     Status: Abnormal   Collection Time: 11/10/18  4:31 PM  Result Value Ref Range   Sodium 137  135 - 145 mmol/L   Potassium 4.6 3.5 - 5.1 mmol/L   Chloride 108 98 - 111 mmol/L   CO2 19 (L) 22 - 32 mmol/L   Glucose, Bld 141 (H) 70 - 99 mg/dL   BUN 55 (H) 8 - 23 mg/dL   Creatinine, Ser 2.76 (H) 0.61 - 1.24 mg/dL   Calcium 14.2 (HH) 8.9 - 10.3 mg/dL    Comment: CRITICAL RESULT CALLED TO, READ BACK BY AND VERIFIED WITH YESSICA AGUAS 11/10/18 @ 1715  MLK    GFR calc non Af Amer 24 (L) >60 mL/min   GFR calc Af Amer 27 (L) >60 mL/min   Anion gap 10 5 - 15    Comment: Performed at [facility], [address], [place], [place] [address]  Troponin I (High Sensitivity)     Status: Abnormal   Collection Time: 11/10/18  4:31 PM  Result Value Ref Range   Troponin I (High Sensitivity) 25 (H) <18 ng/L    Comment: (NOTE) Elevated high sensitivity troponin I (hsTnI) values and significant  changes across serial measurements may suggest ACS but many other  chronic and acute conditions are known to elevate hsTnI results.  Refer to the "Links" section for chest pain algorithms and additional  guidance. Performed at [facility], [address] [place], [place] [address]   SARS Coronavirus 2 [facility] order, Performed in [facility] hospital lab) Nasopharyngeal Nasopharyngeal Swab     Status: None   Collection Time: 11/10/18  6:40 PM   Specimen: Nasopharyngeal Swab  Result Value Ref Range   SARS Coronavirus 2 NEGATIVE NEGATIVE    Comment: (NOTE) If result is NEGATIVE SARS-CoV-2 target nucleic acids are NOT DETECTED. The SARS-CoV-2 RNA is generally detectable in upper and lower  respiratory specimens during the acute phase of infection. The lowest  concentration of SARS-CoV-2 viral copies this assay can detect is 250  copies / mL. A negative result does not preclude SARS-CoV-2 infection  and should not be used as the sole basis for treatment or other  patient management decisions.  A negative result may occur with  improper specimen collection / handling, submission of  specimen  other  than nasopharyngeal swab, presence of viral mutation(s) within the  areas targeted by this assay, and inadequate number of viral copies  (<250 copies / mL). A negative result must be combined with clinical  observations, patient history, and epidemiological information. If result is POSITIVE SARS-CoV-2 target nucleic acids are DETECTED. The SARS-CoV-2 RNA is generally detectable in upper and lower  respiratory specimens dur ing the acute phase of infection.  Positive  results are indicative of active infection with SARS-CoV-2.  Clinical  correlation with patient history and other diagnostic information is  necessary to determine patient infection status.  Positive results do  not rule out bacterial infection or co-infection with other viruses. If result is PRESUMPTIVE POSTIVE SARS-CoV-2 nucleic acids MAY BE PRESENT.   A presumptive positive result was obtained on the submitted specimen  and confirmed on repeat testing.  While 2019 novel coronavirus  (SARS-CoV-2) nucleic acids may be present in the submitted sample  additional confirmatory testing may be necessary for epidemiological  and / or clinical management purposes  to differentiate between  SARS-CoV-2 and other Sarbecovirus currently known to infect humans.  If clinically indicated additional testing with an alternate test  methodology [phone]) is advised. The SARS-CoV-2 RNA is generally  detectable in upper and lower respiratory sp ecimens during the acute  phase of infection. The expected result is Negative. Fact Sheet for Patients:  StrictlyIdeas.no Fact Sheet for Healthcare Providers: BankingDealers.co.za This test is not yet approved or cleared by the [place] FDA and has been authorized for detection and/or diagnosis of SARS-CoV-2 by FDA under an Emergency Use Authorization (EUA).  This EUA will remain in effect (meaning this test can be used) for the  duration of the COVID-19 declaration under Section 564(b)(1) of the Act, 21 U.S.C. section 360bbb-3(b)(1), unless the authorization is terminated or revoked sooner. Performed at [facility], [address] [place], [place] [address]   Magnesium     Status: None   Collection Time: 11/10/18  6:40 PM  Result Value Ref Range   Magnesium 2.4 1.7 - 2.4 mg/dL    Comment: Performed at [facility], [place], [place] [address]  Troponin I (High Sensitivity)     Status: Abnormal   Collection Time: 11/10/18  6:40 PM  Result Value Ref Range   Troponin I (High Sensitivity) 24 (H) <18 ng/L    Comment: (NOTE) Elevated high sensitivity troponin I (hsTnI) values and significant  changes across serial measurements may suggest ACS but many other  chronic and acute conditions are known to elevate hsTnI results.  Refer to the "Links" section for chest pain algorithms and additional  guidance. Performed at [facility], [address] [place],  [place] [address]   Basic metabolic panel     Status: Abnormal   Collection Time: 11/11/18  5:39 AM  Result Value Ref Range   Sodium 139 135 - 145 mmol/L   Potassium 4.6 3.5 - 5.1 mmol/L   Chloride 109 98 - 111 mmol/L   CO2 21 (L) 22 - 32 mmol/L   Glucose, Bld 93 70 - 99 mg/dL   BUN 54 (H) 8 - 23 mg/dL   Creatinine, Ser 2.67 (H) 0.61 - 1.24 mg/dL   Calcium 13.1 (HH) 8.9 - 10.3 mg/dL    Comment: CRITICAL RESULT CALLED TO, READ BACK BY AND VERIFIED WITH KARA CAMPBELL@0624  ON 11/11/18 BY HKP    GFR calc non Af Amer 24 (L) >60 mL/min   GFR calc  Af Amer 28 (L) >60 mL/min   Anion gap 9 5 - 15    Comment: Performed at [facility], [address] [place], [place] [address]  CBC     Status: Abnormal   Collection Time: 11/11/18  5:39 AM  Result Value Ref Range   WBC 14.2 (H) 4.0 - 10.5 K/uL   RBC 3.28 (L) 4.22 - 5.81 MIL/uL   Hemoglobin 9.6 (L) 13.0 - 17.0 g/dL   HCT 30.6 (L) 39.0 - 52.0 %   MCV 93.3  80.0 - 100.0 fL   MCH 29.3 26.0 - 34.0 pg   MCHC 31.4 30.0 - 36.0 g/dL   RDW 14.6 11.5 - 15.5 %   Platelets 437 (H) 150 - 400 K/uL   nRBC 0.0 0.0 - 0.2 %    Comment: Performed at [facility], [address], [place], [place] [address]   Ct Head Wo Contrast  Result Date: 11/10/2018 CLINICAL DATA:  Altered level of consciousness. EXAM: CT HEAD WITHOUT CONTRAST TECHNIQUE: Contiguous axial images were obtained from the base of the skull through the vertex without intravenous contrast. COMPARISON:  None. FINDINGS: Brain: Mild atrophy and minimal small vessel disease. The brain demonstrates no evidence of hemorrhage, infarction, edema, mass effect, extra-axial fluid collection, hydrocephalus or mass lesion. Vascular: No hyperdense vessel or unexpected calcification. Skull: Normal. Negative for fracture or focal lesion. Sinuses/Orbits: No acute finding. Other: None. IMPRESSION: Mild atrophy and small vessel disease.  No acute findings. Electronically Signed   By: Aletta Edouard M.D.   On: 11/10/2018 17:55   Dg Chest Portable 1 View  Result Date: 11/10/2018 CLINICAL DATA:  Shortness of breath for the past 2-3 days. Smoker. EXAM: PORTABLE CHEST 1 VIEW COMPARISON:  10/19/2018. FINDINGS: Normal sized heart. Clear lungs with normal vascularity. Small calcified granuloma in the right lower lung zone. Unremarkable bones. IMPRESSION: No acute abnormality. Electronically Signed   By: Claudie Revering M.D.   On: 11/10/2018 17:11    Assessment:  The patient is a 63 y.o. gentleman with stage III chronic kidney disease and chronic mild hypercalemia admitted with weakness, increased creatinine and hypercalcemia.  Review of records noted weight loss of 3.5 kg since 08/2018.  He is s/p left femoral neck fracture s/p fall on 10/18/2018.  No pathology available.  He denies any bone pain.  He denies any recurrent infections.  He has acute on chronic renal insufficiency.  Baseline creatinine 1.87.  Current  creatinine  2.67 (Cr Cl 29.6 ml/min).  Symptomatically, he denies any complaint.  Exam reveals temporal wasting and suprapubic mass (? sistended bladder).  Plan:   1.   Oncology  Concern raised for malignancy given hypercalcemia and weight loss.  Discuss work-up for hypercalcemia (see below).  Concern for possible multiple myeloma given low albumen, normal serum protein, renal insufficiency, and recent fracture s/p fall.  Check SPEP, FLCA, and 24 hour urine for UPEP and free light chains.  Check PSA. 2.  Hypercalcemia  Patient has had chronic hypercalcemia x several months.  Work-up for myeloma as noted above.  PTH pending.  TSH normal.  Additional labs:  PTH-rp, 1,25 dihydroxyvitamin D, 25 hydroxyvitamin D.  Patient receiving IVF with improvement in calcium. 3.  Renal insufficiency  Appreciate nephrology consult.  Palpable bladder on exam.  Discuss bladder scan with nursing. 4.  Normocytic anemia  No history of melena, hematochezia or hematuria.  Check ferritin, iron stores, B12, folate, retic.   Thank you for allowing me to participate in  Walker Shadow 's care.  I will follow him closely with you while hospitalized and after discharge in the outpatient department.  [name], MD  11/11/2018, 3:58 PM

## 2018-11-11 NOTE — Progress Notes (Signed)
[facility] [place] NAME: Ruben Clark    MR#:  [id number]  DATE OF BIRTH:  [birth year]  SUBJECTIVE:  CHIEF COMPLAINT:   Chief Complaint  Patient presents with  . Weakness  Admits having significant weight loss, feels that he just does not have any appetite, feels very weak REVIEW OF SYSTEMS:  Review of Systems  Constitutional: Positive for malaise/fatigue and weight loss. Negative for diaphoresis and fever.  HENT: Negative for ear discharge, ear pain, hearing loss, nosebleeds, sore throat and tinnitus.   Eyes: Negative for blurred vision and pain.  Respiratory: Negative for cough, hemoptysis, shortness of breath and wheezing.   Cardiovascular: Negative for chest pain, palpitations, orthopnea and leg swelling.  Gastrointestinal: Negative for abdominal pain, blood in stool, constipation, diarrhea, heartburn, nausea and vomiting.  Genitourinary: Negative for dysuria, frequency and urgency.  Musculoskeletal: Negative for back pain and myalgias.  Skin: Negative for itching and rash.  Neurological: Negative for dizziness, tingling, tremors, focal weakness, seizures, weakness and headaches.  Psychiatric/Behavioral: Negative for depression. The patient is not nervous/anxious.    DRUG ALLERGIES:  No Known Allergies VITALS:  Blood pressure (!) 107/50, pulse 77, temperature 98.9 F (37.2 C), temperature source Oral, resp. rate 16, height 5\' 10"  (1.778 m), weight 85 kg, SpO2 99 %. PHYSICAL EXAMINATION:  Physical Exam HENT:     Head: Normocephalic and atraumatic.  Eyes:     Conjunctiva/sclera: Conjunctivae normal.     Pupils: Pupils are equal, round, and reactive to light.  Neck:     Musculoskeletal: Normal range of motion and neck supple.     Thyroid: No thyromegaly.     Trachea: No tracheal deviation.  Cardiovascular:     Rate and Rhythm: Normal rate and regular rhythm.     Heart sounds: Normal heart sounds.  Pulmonary:   Effort: Pulmonary effort is normal. No respiratory distress.     Breath sounds: Normal breath sounds. No wheezing.  Chest:     Chest wall: No tenderness.  Abdominal:     General: Bowel sounds are normal. There is no distension.     Palpations: Abdomen is soft.     Tenderness: There is no abdominal tenderness.  Musculoskeletal: Normal range of motion.  Skin:    General: Skin is warm and dry.     Findings: No rash.  Neurological:     Mental Status: He is alert and oriented to person, place, and time.     Cranial Nerves: No cranial nerve deficit.    LABORATORY PANEL:  Male CBC Recent Labs  Lab 11/11/18 0539  WBC 14.2*  HGB 9.6*  HCT 30.6*  PLT 437*   ------------------------------------------------------------------------------------------------------------------ Chemistries  Recent Labs  Lab 11/10/18 1840 11/11/18 0539  NA  --  139  K  --  4.6  CL  --  109  CO2  --  21*  GLUCOSE  --  93  BUN  --  54*  CREATININE  --  2.67*  CALCIUM  --  13.1*  MG 2.4  --    RADIOLOGY:  Ct Head Wo Contrast  Result Date: 11/10/2018 CLINICAL DATA:  Altered level of consciousness. EXAM: CT HEAD WITHOUT CONTRAST TECHNIQUE: Contiguous axial images were obtained from the base of the skull through the vertex without intravenous contrast. COMPARISON:  None. FINDINGS: Brain: Mild atrophy and minimal small vessel disease. The brain demonstrates no evidence of hemorrhage, infarction, edema, mass effect, extra-axial  fluid collection, hydrocephalus or mass lesion. Vascular: No hyperdense vessel or unexpected calcification. Skull: Normal. Negative for fracture or focal lesion. Sinuses/Orbits: No acute finding. Other: None. IMPRESSION: Mild atrophy and small vessel disease.  No acute findings. Electronically Signed   By: Aletta Edouard M.D.   On: 11/10/2018 17:55   Dg Chest Portable 1 View  Result Date: 11/10/2018 CLINICAL DATA:  Shortness of breath for the past 2-3 days. Smoker. EXAM: PORTABLE CHEST  1 VIEW COMPARISON:  10/19/2018. FINDINGS: Normal sized heart. Clear lungs with normal vascularity. Small calcified granuloma in the right lower lung zone. Unremarkable bones. IMPRESSION: No acute abnormality. Electronically Signed   By: Claudie Revering M.D.   On: 11/10/2018 17:11   ASSESSMENT AND PLAN:  63 year old male patient with a known history of hip fracture, chronic congestive heart failure, CKD stage III, hypertension, chronic afib on eliquis was referred from group home.  Patient recently had hip fracture which was repaired and sent to rehab.  Day ago patient was sent from rehab group home.  He has generalized weakness.    * Acute hypercalcemia and significant unintentional weight loss -Improved some with IV fluid hydration with normal saline Follow-up calcium levels -Worrisome for underlying malignancy.  Consult oncology  * Dehydration -Continue IV fluids  -Hypo-tension secondary to dehydration IV fluids and recheck blood pressure  -History of hip fracture status post ORIF Physical therapy to continue  -Acute on chronic kidney disease stage III -creatinine at 2.67 today -Monitor while on IV fluids Avoid nephrotoxic meds -Nephrology consultation  -DVT prophylaxis On anticoagulation with Eliquis  -Chronic Afib Continue eliquis for anticoagulation  -Tobacco abuse Tobacco cessation counseled for six minutes by admitting doctor Nicotine patch offered, but patient did not want it.  -Covid 19 test negative     All the records are reviewed and case discussed with Care Management/Social Worker. Management plans discussed with the patient, nursing and they are in agreement.  CODE STATUS: Full Code  TOTAL TIME TAKING CARE OF THIS PATIENT: 35 minutes.   More than 50% of the time was spent in counseling/coordination of care: YES  POSSIBLE D/C IN 1-2 DAYS, DEPENDING ON CLINICAL CONDITION.   Max Sane M.D on 11/11/2018 at 12:45 PM  Between 7am to 6pm - Pager -  [phone]  After 6pm go to [email or web address] - Proofreader  [facility]  Office  [phone]  CC: Primary care physician; System, Pcp Not In  Note: This dictation was prepared with Dragon dictation along with smaller phrase technology. Any transcriptional errors that result from this process are unintentional.

## 2018-11-12 ENCOUNTER — Inpatient Hospital Stay: Payer: Medicare HMO

## 2018-11-12 DIAGNOSIS — Z515 Encounter for palliative care: Secondary | ICD-10-CM

## 2018-11-12 DIAGNOSIS — Z7189 Other specified counseling: Secondary | ICD-10-CM

## 2018-11-12 LAB — BASIC METABOLIC PANEL
Anion gap: 7 (ref 5–15)
BUN: 48 mg/dL — ABNORMAL HIGH (ref 8–23)
CO2: 19 mmol/L — ABNORMAL LOW (ref 22–32)
Calcium: 13.2 mg/dL (ref 8.9–10.3)
Chloride: 116 mmol/L — ABNORMAL HIGH (ref 98–111)
Creatinine, Ser: 2.45 mg/dL — ABNORMAL HIGH (ref 0.61–1.24)
GFR calc Af Amer: 32 mL/min — ABNORMAL LOW (ref >60)
GFR calc non Af Amer: 27 mL/min — ABNORMAL LOW (ref >60)
Glucose, Bld: 72 mg/dL (ref 70–99)
Potassium: 4.5 mmol/L (ref 3.5–5.1)
Sodium: 142 mmol/L (ref 135–145)

## 2018-11-12 LAB — C3 COMPLEMENT: C3 Complement: 122 mg/dL (ref 82–167)

## 2018-11-12 LAB — CBC
HCT: 32.1 % — ABNORMAL LOW (ref 39.0–52.0)
Hemoglobin: 9.8 g/dL — ABNORMAL LOW (ref 13.0–17.0)
MCH: 29.1 pg (ref 26.0–34.0)
MCHC: 30.5 g/dL (ref 30.0–36.0)
MCV: 95.3 fL (ref 80.0–100.0)
Platelets: 468 10*3/uL — ABNORMAL HIGH (ref 150–400)
RBC: 3.37 MIL/uL — ABNORMAL LOW (ref 4.22–5.81)
RDW: 14.8 % (ref 11.5–15.5)
WBC: 12.4 10*3/uL — ABNORMAL HIGH (ref 4.0–10.5)
nRBC: 0 % (ref 0.0–0.2)

## 2018-11-12 LAB — HIV ANTIBODY (ROUTINE TESTING W REFLEX): HIV Screen 4th Generation wRfx: NONREACTIVE

## 2018-11-12 LAB — IRON AND TIBC
Iron: 16 ug/dL — ABNORMAL LOW (ref 45–182)
Saturation Ratios: 10 % — ABNORMAL LOW (ref 17.9–39.5)
TIBC: 161 ug/dL — ABNORMAL LOW (ref 250–450)
UIBC: 145 ug/dL

## 2018-11-12 LAB — PARATHYROID HORMONE, INTACT (NO CA): PTH: 16 pg/mL (ref 15–65)

## 2018-11-12 LAB — RETICULOCYTES
Immature Retic Fract: 7.9 % (ref 2.3–15.9)
RBC.: 3.37 MIL/uL — ABNORMAL LOW (ref 4.22–5.81)
Retic Count, Absolute: 44.1 10*3/uL (ref 19.0–186.0)
Retic Ct Pct: 1.3 % (ref 0.4–3.1)

## 2018-11-12 LAB — FERRITIN: Ferritin: 959 ng/mL — ABNORMAL HIGH (ref 24–336)

## 2018-11-12 LAB — C4 COMPLEMENT: Complement C4, Body Fluid: 23 mg/dL (ref 14–44)

## 2018-11-12 LAB — FOLATE: Folate: 4.7 ng/mL — ABNORMAL LOW (ref >5.9)

## 2018-11-12 LAB — HEPATITIS B SURFACE ANTIGEN: Hepatitis B Surface Ag: NEGATIVE

## 2018-11-12 LAB — KAPPA/LAMBDA LIGHT CHAINS
Kappa free light chain: 156.7 mg/L — ABNORMAL HIGH (ref 3.3–19.4)
Kappa, lambda light chain ratio: 1.44 (ref 0.26–1.65)
Lambda free light chains: 109.1 mg/L — ABNORMAL HIGH (ref 5.7–26.3)

## 2018-11-12 LAB — PSA: Prostatic Specific Antigen: 0.02 ng/mL (ref 0.00–4.00)

## 2018-11-12 LAB — HEPATITIS B SURFACE ANTIBODY,QUALITATIVE: Hep B S Ab: NONREACTIVE

## 2018-11-12 LAB — VITAMIN B12: Vitamin B-12: 382 pg/mL (ref 180–914)

## 2018-11-12 LAB — HEPATITIS C ANTIBODY (REFLEX): HCV Ab: 0.9 s/co ratio (ref 0.0–0.9)

## 2018-11-12 LAB — HCV COMMENT:

## 2018-11-12 MED ORDER — IOHEXOL 240 MG/ML SOLN
50.0000 mL | Freq: Once | INTRAMUSCULAR | Status: AC | PRN
  Administered 2018-11-12: 50 mL via ORAL

## 2018-11-12 NOTE — Progress Notes (Signed)
[facility], [place] 11/12/18  Subjective:   Denies any acute complaints Appetite remains poor Renal ultrasound from yesterday showed bilateral hydronephrosis. Abdominal CT without contrast shows a bladder mass with bilateral hydronephrosis  Objective:  Vital signs in last 24 hours:  Temp:  [98 F (36.7 C)-98.4 F (36.9 C)] 98.2 F (36.8 C) (08/07 0753) Pulse Rate:  [72-82] 72 (08/07 0753) Resp:  [20] 20 (08/07 0401) BP: (101-124)/(58-76) 124/62 (08/07 0753) SpO2:  [100 %] 100 % (08/07 0753) Weight:  [66.8 kg] 66.8 kg (08/07 0401)  Weight change: -18.2 kg Filed Weights   11/10/18 1625 11/12/18 0401  Weight: 85 kg 66.8 kg    Intake/Output:    Intake/Output Summary (Last 24 hours) at 11/12/2018 1357 Last data filed at 11/12/2018 1019 Gross per 24 hour  Intake 1224.21 ml  Output 2030 ml  Net -805.79 ml     Physical Exam: General:  Thin, cachectic gentleman, laying in the bed  HEENT  anicteric, moist oral mucous membranes  Neck  supple  Pulm/lungs  clear to auscultation, normal effort  CVS/Heart  no rub  Abdomen:   Fullness, firmness over lower abdomen  Extremities:  No edema  Neurologic:  Alert, able to answer few simple questions  Skin:  Dry skin    Basic Metabolic Panel:  Recent Labs  Lab 11/10/18 1631 11/10/18 1840 11/11/18 0539 11/11/18 1500 11/12/18 0636  NA 137  --  139  --  142  K 4.6  --  4.6  --  4.5  CL 108  --  109  --  116*  CO2 19*  --  21*  --  19*  GLUCOSE 141*  --  93  --  72  BUN 55*  --  54*  --  48*  CREATININE 2.76*  --  2.67*  --  2.45*  CALCIUM 14.2*  --  13.1*  --  13.2*  MG  --  2.4  --   --   --   PHOS  --   --   --  3.5  --      CBC: Recent Labs  Lab 11/10/18 1631 11/11/18 0539 11/12/18 0636  WBC 13.6* 14.2* 12.4*  NEUTROABS 11.0*  --   --   HGB 10.4* 9.6* 9.8*  HCT 32.8* 30.6* 32.1*  MCV 91.6 93.3 95.3  PLT 519* 437* 468*      Lab Results  Component Value Date   HEPBSAG Negative  11/11/2018   HEPBSAB Non Reactive 11/11/2018      Microbiology:  Recent Results (from the past 240 hour(s))  SARS Coronavirus 2 [facility] order, Performed in [facility] hospital lab) Nasopharyngeal Nasopharyngeal Swab     Status: None   Collection Time: 11/10/18  6:40 PM   Specimen: Nasopharyngeal Swab  Result Value Ref Range Status   SARS Coronavirus 2 NEGATIVE NEGATIVE Final    Comment: (NOTE) If result is NEGATIVE SARS-CoV-2 target nucleic acids are NOT DETECTED. The SARS-CoV-2 RNA is generally detectable in upper and lower  respiratory specimens during the acute phase of infection. The lowest  concentration of SARS-CoV-2 viral copies this assay can detect is 250  copies / mL. A negative result does not preclude SARS-CoV-2 infection  and should not be used as the sole basis for treatment or other  patient management decisions.  A negative result may occur with  improper specimen collection / handling, submission of specimen other  than nasopharyngeal swab, presence of viral mutation(s) within the  areas targeted by this assay, and inadequate number of viral copies  (<250 copies / mL). A negative result must be combined with clinical  observations, patient history, and epidemiological information. If result is POSITIVE SARS-CoV-2 target nucleic acids are DETECTED. The SARS-CoV-2 RNA is generally detectable in upper and lower  respiratory specimens dur ing the acute phase of infection.  Positive  results are indicative of active infection with SARS-CoV-2.  Clinical  correlation with patient history and other diagnostic information is  necessary to determine patient infection status.  Positive results do  not rule out bacterial infection or co-infection with other viruses. If result is PRESUMPTIVE POSTIVE SARS-CoV-2 nucleic acids MAY BE PRESENT.   A presumptive positive result was obtained on the submitted specimen  and confirmed on repeat testing.  While 2019 novel  coronavirus  (SARS-CoV-2) nucleic acids may be present in the submitted sample  additional confirmatory testing may be necessary for epidemiological  and / or clinical management purposes  to differentiate between  SARS-CoV-2 and other Sarbecovirus currently known to infect humans.  If clinically indicated additional testing with an alternate test  methodology [id number]) is advised. The SARS-CoV-2 RNA is generally  detectable in upper and lower respiratory sp ecimens during the acute  phase of infection. The expected result is Negative. Fact Sheet for Patients:  StrictlyIdeas.no Fact Sheet for Healthcare Providers: BankingDealers.co.za This test is not yet approved or cleared by the [place] FDA and has been authorized for detection and/or diagnosis of SARS-CoV-2 by FDA under an Emergency Use Authorization (EUA).  This EUA will remain in effect (meaning this test can be used) for the duration of the COVID-19 declaration under Section 564(b)(1) of the Act, 21 U.S.C. section 360bbb-3(b)(1), unless the authorization is terminated or revoked sooner. Performed at [facility], [address] [place], [place] [address]     Coagulation Studies: No results for input(s): LABPROT, INR in the last 72 hours.  Urinalysis: Recent Labs    11/11/18 1850  COLORURINE YELLOW*  LABSPEC 1.011  PHURINE 6.0  GLUCOSEU NEGATIVE  HGBUR MODERATE*  BILIRUBINUR NEGATIVE  KETONESUR NEGATIVE  PROTEINUR 100*  NITRITE NEGATIVE  LEUKOCYTESUR MODERATE*      Imaging: Ct Abdomen Pelvis Wo Contrast  Result Date: 11/12/2018 CLINICAL DATA:  Abnormal renal bladder ultrasound prior day EXAM: CT ABDOMEN AND PELVIS WITHOUT CONTRAST TECHNIQUE: Multidetector CT imaging of the abdomen and pelvis was performed following the standard protocol without IV contrast. Oral contrast was received. COMPARISON:  Ultrasound November 11, 2018 FINDINGS: Lower chest: The  visualized heart size within normal limits. No pericardial fluid/thickening. No hiatal hernia. The visualized portions of the lungs are clear. Hepatobiliary: There are multiple low-density lesions seen throughout the liver parenchyma the largest measuring 6.2 cm within the caudate lobe no evidence of calcified gallstones, gallbladder wall thickening or biliary dilatation. Pancreas: Unremarkable. No pancreatic ductal dilatation or surrounding inflammatory changes. Spleen: Normal in size without focal abnormality. Adrenals/Urinary Tract: There is mild thickening of the bilateral adrenal glands without a focal abnormality. There is marked severe bilateral pelvicaliectasis and ureterectasis down to the level of the UVJ. There is a heterogeneous hyperdense large lobular bladder wall mass seen superiorly which protrudes and displaces several loops of small bowel. There is also a downward mass effect seen upon the prostate gland. The bladder is grossly distended with fluid. Within the upper pole of the right kidney there is a 1.8 cm low-density lesion. There is a 6 mm calculus seen in the upper pole  the left kidney. Stomach/Bowel: The stomach, small bowel, and colon are normal in appearance. A moderate amount of colonic stool is present. No definite areas of bowel wall thickening. Vascular/Lymphatic: No definite mesenteric or pelvic sidewall adenopathy seen. Scattered aortic and iliac atherosclerotic calcifications are seen without aneurysmal dilatation. Reproductive: Downward displacement due to the large bladder wall mass. Other: No evidence of abdominal wall mass or hernia. Musculoskel0etal: No acute or significant osseous findings. There is diffuse osteopenia. Fixation hardware seen in the left femur. IMPRESSION: 1. Large lobular soft tissue superior bladder wall mass causing marked bilateral hydronephrosis and mass effect upon small-bowel and prostate gland. 2. Multiple low-density lesions throughout the liver  parenchyma the largest measuring 6 cm in the caudate lobe,unable to further evaluate due to the lack of intravenous contrast. 3. 6 mm renal calculus in the left upper pole. Electronically Signed   By: Prudencio Pair M.D.   On: 11/12/2018 12:49   Ct Head Wo Contrast  Result Date: 11/10/2018 CLINICAL DATA:  Altered level of consciousness. EXAM: CT HEAD WITHOUT CONTRAST TECHNIQUE: Contiguous axial images were obtained from the base of the skull through the vertex without intravenous contrast. COMPARISON:  None. FINDINGS: Brain: Mild atrophy and minimal small vessel disease. The brain demonstrates no evidence of hemorrhage, infarction, edema, mass effect, extra-axial fluid collection, hydrocephalus or mass lesion. Vascular: No hyperdense vessel or unexpected calcification. Skull: Normal. Negative for fracture or focal lesion. Sinuses/Orbits: No acute finding. Other: None. IMPRESSION: Mild atrophy and small vessel disease.  No acute findings. Electronically Signed   By: Aletta Edouard M.D.   On: 11/10/2018 17:55   US Renal  Result Date: 11/11/2018 CLINICAL DATA:  63 year old male with acute renal failure. EXAM: RENAL / URINARY TRACT ULTRASOUND COMPLETE COMPARISON:  None. FINDINGS: Right Kidney: Renal measurements: 11.6 x 5.9 x 6.8 centimeters = volume: 242 mL. Moderate to severe right hydronephrosis (image 2). Superimposed echogenic layering debris suspected within the dilated collecting system (see power Doppler image 11). Grossly normal renal cortical echogenicity. No right renal mass identified. Left Kidney: Renal measurements: 11.3 x 5.6 x 4.7 centimeters = volume: 155 mL. Moderate to severe hydronephrosis with similar echogenic nonvascular debris throughout the collecting system (image 66). Suggestion of a superimposed shadowing calculus up to 10 millimeters (image 75). Grossly normal left cortical echogenicity. Bladder: Highly abnormal appearance of the bladder with what on grayscale imaging seems to be  fungating mass, but with the application of Doppler (image 94) seems to be nonvascular tumefactive debris. The technologist reports the patient was unable to void. IMPRESSION: 1. Severely abnormal ultrasound appearance of the bladder and both kidneys. 2. Severe Bilateral Hydronephrosis with what appears to be bulky layering and adherent Urinary Debris throughout the both dilated collecting systems and the bladder. 3. Underlying bladder tumor is difficult to exclude. Recommend correlation with urinalysis and consider Urology consultation. Electronically Signed   By: Genevie Ann M.D.   On: 11/11/2018 17:13   Dg Chest Portable 1 View  Result Date: 11/10/2018 CLINICAL DATA:  Shortness of breath for the past 2-3 days. Smoker. EXAM: PORTABLE CHEST 1 VIEW COMPARISON:  10/19/2018. FINDINGS: Normal sized heart. Clear lungs with normal vascularity. Small calcified granuloma in the right lower lung zone. Unremarkable bones. IMPRESSION: No acute abnormality. Electronically Signed   By: Claudie Revering M.D.   On: 11/10/2018 17:11     Medications:   . sodium chloride 125 mL/hr at 11/12/18 0916   . apixaban  5 mg Oral BID  . aspirin EC  81 mg Oral Daily  . Chlorhexidine Gluconate Cloth  6 each Topical Q0600  . digoxin  0.25 mg Oral Daily  . docusate sodium  100 mg Oral BID  . finasteride  5 mg Oral Daily  . gabapentin  300 mg Oral BID  . loratadine  10 mg Oral Daily  . LORazepam  0.5 mg Oral Daily  . metoprolol succinate  12.5 mg Oral Daily  . mupirocin ointment  1 application Nasal BID   acetaminophen **OR** acetaminophen, bisacodyl, hydrOXYzine, ondansetron **OR** ondansetron (ZOFRAN) IV, senna  Assessment/ Plan:  63 y.o. male with medical problems of ch sys CHF, CKD, A Fib, HTN who is a resident of group home is admitted to [facility] on 11/10/2018 for evaluation of weakness, weight loss. Had left femoral neck fracture in 10/2018  1. AKI, bilateral hydronephrosis 2. Hypercalcemia 3. Weight loss, cachexia 4.  Anemia 5.  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  Agree with palliative care evaluation. Continue IV hydration for management of hypercalcemia   LOS: [place] 8/7/20201:57 PM  [place], [place]  Note: This note was prepared with Dragon dictation. Any transcription errors are unintentional

## 2018-11-12 NOTE — Progress Notes (Signed)
Critical lab called to this RN this AM of calcium 13.2.  Lab value down from admission. Will make MD aware.

## 2018-11-12 NOTE — TOC Initial Note (Addendum)
Transition of Care [facility]) - Initial/Assessment Note    Patient Details  Name: Ruben Clark MRN: [id number] Date of Birth: 1955/11/30  [facility] CM/SW Contact:    Ruben Stack, RN Phone Number: 11/12/2018, 8:12 AM  Clinical Narrative:              Patient was discharged from [facility] 8/5 back to [place].  When the transportation staff got hiim to the facility, He could not get out of the car without maximum assist.  Per Ruben Clark- administrator- patient could not stand up.  It took 3 people to lift him out of the car to set him on the rollator walker. He was pushed into the facility and Ruben Clark called 911 to have patient transferred back to the hospital.  CM spoke with Ruben Clark. She states that patient was ambulating 30 feet with walker.  His coverage ended by [place] if guardian was notified. Asked if home health was set up and she is checking and will call back. Ruben Clark says that patient can not return to the facility if he is not able to walk.  She thinks patient needs a higher level of care. Patient has been a resident at [facility] for 3 months but "has spent most of it in the hospital.  CM left a voicemail message for guardian Ruben Clark. Unsure if patient has medicaid to cover long term care if it is needed.  PT consult is pending  Informed patient had referral to [facility]     Barriers to Discharge: Other (comment)(Unsure if [place] home can take him back. Do not know if he has medicaid for long term care.  May need higher level)   Patient Goals and CMS Choice   CMS Medicare.gov Compare Post Acute Care list provided to:: Legal Guardian    Expected Discharge Plan and Services         Living arrangements for the past 2 months: [place], [place] Expected Discharge Date: 11/12/18                                    Prior Living  Arrangements/Services Living arrangements for the past 2 months: [place], [place] Lives with:: Facility Resident Patient language and need for interpreter reviewed:: Yes            Current home services: Other (comment)(Trying to find out if [facility] set up home health) Criminal Activity/Legal Involvement Pertinent to Current Situation/Hospitalization: No - Comment as needed  Activities of Daily Living Home Assistive Devices/Equipment: Gilford Rile (specify type) ADL Screening (condition at time of admission) Patient's cognitive ability adequate to safely complete daily activities?: Yes Is the patient deaf or have difficulty hearing?: No Does the patient have difficulty seeing, even when wearing glasses/contacts?: No Does the patient have difficulty concentrating, remembering, or making decisions?: No Patient able to express need for assistance with ADLs?: Yes Does the patient have difficulty dressing or bathing?: Yes Independently performs ADLs?: No Communication: Independent Dressing (OT): Needs assistance Is this a change from baseline?: Pre-admission baseline Grooming: Independent Feeding: Independent Bathing: Needs assistance Is this a change from baseline?: Pre-admission baseline Toileting: Needs assistance Is this a change from baseline?: Pre-admission baseline In/Out Bed: Needs assistance Is this a change from baseline?: Pre-admission baseline Walks in  Home: Needs assistance Is this a change from baseline?: Pre-admission baseline Does the patient have difficulty walking or climbing stairs?: Yes Weakness of Legs: Both Weakness of Arms/Hands: None  Permission Sought/Granted                  Emotional Assessment Appearance:: Appears older than stated age Attitude/Demeanor/Rapport: Engaged Affect (typically observed): Calm Orientation: : Oriented to Self, Oriented to Place, Oriented to  Time, Oriented to  Situation Alcohol / Substance Use: Not Applicable Psych Involvement: No (comment)  Admission diagnosis:  Hypercalcemia [E83.52] Generalized weakness [R53.1] Altered mental status, unspecified altered mental status type [R41.82] Patient Active Problem List   Diagnosis Date Noted  . Hypercalcemia 11/10/2018  . Closed left hip fracture ([place]) 10/19/2018  . Chronic systolic CHF (congestive heart failure) ([place]) 10/19/2018  . HTN (hypertension) 10/19/2018  . Sepsis ([place]) 10/07/2018  . COPD (chronic obstructive pulmonary disease) ([place]) 08/26/2018  . Atrial fibrillation ([place]) 08/26/2018  . Left foot infection 08/02/2018   PCP:  System, Pcp Not In Pharmacy:   [facility], [place] [place] [address] Phone: [phone] Fax: [phone]     Social Determinants of Health (SDOH) Interventions    Readmission Risk Interventions Readmission Risk Prevention Plan 10/10/2018  Post Dischage Appt Complete  Medication Screening Complete  Transportation Screening Complete

## 2018-11-12 NOTE — Care Management Important Message (Signed)
Important Message  Patient Details  Name: Ruben Clark MRN: [id number] Date of Birth: 1955/06/23   Medicare Important Message Given:  Yes     Juliann Pulse A Braxton Vantrease 11/12/2018, 2:55 PM

## 2018-11-12 NOTE — Progress Notes (Signed)
PT Cancellation Note  Patient Details Name: Ruben Clark MRN: [id number] DOB: 1955/10/12   Cancelled Treatment:    Reason Eval/Treat Not Completed: Medical issues which prohibited therapy. Pt still with elevated Ca+ and has pending CT of pelvis. Will need to hold therapy until medically cleared.   Talis Iwan 11/12/2018, 12:05 PM Greggory Stallion, PT, DPT [phone]

## 2018-11-12 NOTE — Consult Note (Addendum)
Consultation Note Date: 11/12/2018   Patient Name: Ruben Clark  DOB: 12/05/1955  MRN: [id number]  Age / Sex: 63 y.o., male  PCP: System, Pcp Not In Referring Physician: Max Sane, MD  Reason for Consultation: Establishing goals of care  HPI/Patient Profile: Ruben Clark  is a 63 y.o. male with a known history of recent hip fracture, chronic systolic congestive heart failure, CKD stage III, hypertension, chronic afib on anticoagulation with eliquis, who was referred from group home. Here he has had diagnostic testing revealing a bladder mass and bilateral hydronephrosis and AKI. Oncology has been consulted.   Clinical Assessment and Goals of Care: Patient is resting in bed. Ruben Clark states he lives in a a group home. He tells me he has lived there for around 12 years, since his parents died. He states he has a legal guardian to help manage his money, and stated the name of the guardian.   Functionally, he states he is having difficulty walking since "I broke my hip." he states he uses a cane and walker. He states his appetite has been good.   He states the doctor's have been keeping him updated and have made him  aware of the likelihood of "bladder cancer". He states he does not want treatment as he does not have the money for it. He states he would not want a feeding tube, a breathing machine, or CPR. When asked what would happen if he needed a breathing machine or CPR and did not receive it, he stated " well, I'd die." He states he wants to return to the group home and be tucked in with a focus on comfort.    Voicemail left for legal guardian Ruben Clark.        ADDENDUM: Return call from legal guardian recieved. Ruben Clark works for an [employer]. He states Ruben Clark has been in family care homes, briefly lived alone, and returned to assited living facilities. He states he understands what Ruben Clark is communicating,  but he states Ruben Clark cannot make decisions regarding his healthcare and states he wants any and all care available for him. Discussed QOL.  Discussed the need for patient's cooperation in care in order to provide care. Discussed the concept of hospice. Discussed that the physicians would contact him for further planning on healthcare. Chavis states hospice would be his "plan B."  SUMMARY OF RECOMMENDATIONS   Patient wants to be discharged back to his group home. He does not want further work up or treatment for cancer. He would like to focus on comfort.   ADDENDUM: Legal guardian wants all care possible.     Prognosis:   < 6 months if cancer.   Discharge Planning: To Be Determined      Primary Diagnoses: Present on Admission:  Hypercalcemia   I have reviewed the medical record, interviewed the patient and family, and examined the patient. The following aspects are pertinent.  Past Medical History:  Diagnosis Date   CHF (congestive heart failure) ([place])  Chronic kidney disease (CKD), stage III (moderate) ([facility])    Hypertension    Social History   Socioeconomic History   Marital status: Single    Spouse name: Not on file   Number of children: Not on file   Years of education: Not on file   Highest education level: Not on file  Occupational History   Not on file  Social Needs   Financial resource strain: Not on file   Food insecurity    Worry: Not on file    Inability: Not on file   Transportation needs    Medical: Not on file    Non-medical: Not on file  Tobacco Use   Smoking status: Current Every Day Smoker   Smokeless tobacco: Never Used  Substance and Sexual Activity   Alcohol use: Never    Frequency: Never   Drug use: Never   Sexual activity: Not on file  Lifestyle   Physical activity    Days per week: Not on file    Minutes per session: Not on file   Stress: Not on file  Relationships   Social connections    Talks on phone: Not on  file    Gets together: Not on file    Attends religious service: Not on file    Active member of club or organization: Not on file    Attends meetings of clubs or organizations: Not on file    Relationship status: Not on file  Other Topics Concern   Not on file  Social History Narrative   Not on file   History reviewed. No pertinent family history. Scheduled Meds:  apixaban  5 mg Oral BID   aspirin EC  81 mg Oral Daily   Chlorhexidine Gluconate Cloth  6 each Topical Q0600   digoxin  0.25 mg Oral Daily   docusate sodium  100 mg Oral BID   finasteride  5 mg Oral Daily   gabapentin  300 mg Oral BID   loratadine  10 mg Oral Daily   LORazepam  0.5 mg Oral Daily   metoprolol succinate  12.5 mg Oral Daily   mupirocin ointment  1 application Nasal BID   Continuous Infusions:  sodium chloride 125 mL/hr at 11/12/18 0916   PRN Meds:.acetaminophen **OR** acetaminophen, bisacodyl, hydrOXYzine, ondansetron **OR** ondansetron (ZOFRAN) IV, senna Medications Prior to Admission:  Prior to Admission medications   Medication Sig Start Date End Date Taking? Authorizing Provider  apixaban (ELIQUIS) 5 MG TABS tablet Take 1 tablet (5 mg total) by mouth 2 (two) times daily. 08/09/18  Yes Dustin Flock, MD  aspirin EC 81 MG tablet Take 81 mg by mouth daily.   Yes [provider]  digoxin (LANOXIN) 0.25 MG tablet Take 0.25 mg by mouth daily.   Yes [provider]  docusate sodium (COLACE) 100 MG capsule Take 100 mg by mouth 2 (two) times daily.   Yes [provider]  finasteride (PROSCAR) 5 MG tablet Take 1 tablet (5 mg total) by mouth daily. 08/09/18  Yes Dustin Flock, MD  gabapentin (NEURONTIN) 300 MG capsule Take 300 mg by mouth 2 (two) times daily.    Yes [provider]  hydrOXYzine (ATARAX/VISTARIL) 25 MG tablet Take 25 mg by mouth every 8 (eight) hours as needed for itching.    Yes [provider]  loratadine (CLARITIN) 10 MG tablet Take  10 mg by mouth daily.   Yes [provider]  LORazepam (ATIVAN) 0.5 MG tablet Take 0.5 tablets (0.25  mg total) by mouth daily. Patient taking differently: Take 0.5 mg by mouth daily.  08/09/18  Yes Dustin Flock, MD  bisacodyl (DULCOLAX) 5 MG EC tablet Take 1 tablet (5 mg total) by mouth daily as needed for moderate constipation. 10/21/18   Demetrios Loll, MD  metoprolol succinate (TOPROL-XL) 25 MG 24 hr tablet Take 0.5 tablets (12.5 mg total) by mouth daily. 10/10/18 11/09/18  Saundra Shelling, MD  senna (SENOKOT) 8.6 MG TABS tablet Take 1 tablet (8.6 mg total) by mouth daily as needed for mild constipation. 10/21/18   Demetrios Loll, MD   No Known Allergies Review of Systems  Musculoskeletal:       Intermittent pain from hip surgery.     Physical Exam Pulmonary:     Effort: Pulmonary effort is normal.  Skin:    General: Skin is warm and dry.  Neurological:     Mental Status: He is alert.     Vital Signs: BP 124/62 (BP Location: Right Arm)    Pulse 72    Temp 98.2 F (36.8 C) (Oral)    Resp 20    Ht 5\' 10"  (1.778 m)    Wt 66.8 kg    SpO2 100%    BMI 21.13 kg/m  Pain Scale: 0-10   Pain Score: 0-No pain   SpO2: SpO2: 100 % O2 Device:SpO2: 100 % O2 Flow Rate: .   IO: Intake/output summary:   Intake/Output Summary (Last 24 hours) at 11/12/2018 1446 Last data filed at 11/12/2018 1019 Gross per 24 hour  Intake 1224.21 ml  Output 2030 ml  Net -805.79 ml    LBM:   Baseline Weight: Weight: 85 kg Most recent weight: Weight: 66.8 kg     Palliative Assessment/Data: 40%     Time In: 2: 20, 4:20 Time Out: 3:00, 5:00 Time Total: 40 min, 40 min Greater than 50%  of this time was spent counseling and coordinating care related to the above assessment and plan.  Signed by: Asencion Gowda, NP   Please contact Palliative Medicine Team phone at [phone] for questions and concerns.  For individual provider: See Shea Evans

## 2018-11-12 NOTE — Plan of Care (Signed)
Patient is currently out of his room at the time of visit.

## 2018-11-12 NOTE — Consult Note (Signed)
Urology Consult  Chief Complaint: Weakness  History of Present Illness: Ruben Clark is a 63 y.o. year old male with a history of chronic systolic congestive heart failure, CKD stage III, chronic atrial fibrillation on Eliquis and hypertension seen in consultation at the request of Dr. Manuella Ghazi for evaluation of severe bilateral hydronephrosis, weakness, weight loss and possible bladder tumor.  He was recently hospitalized here with a left hip fracture and underwent surgical management.  He was discharged to rehab and subsequently transferred to a group home however when he got there he was not ambulatory and was sent to the ED.  He was found to have hypercalcemia and dehydration and was admitted to the hospitalist service  A renal ultrasound showed severe bilateral hydronephrosis and a CT of the abdomen pelvis without contrast performed today showed bilateral hydronephrosis and hydroureter to the bladder.  The bladder was distended with a large, multifocal mass.  Liver lesions were noted however could not be evaluated without contrast.  He had a nonobstructing left renal calculus.  The patient has no complaints.  He denies flank, abdominal or pelvic pain.  He states he has no voiding complaints and denies gross hematuria.  He stated that he just wanted to be discharged and go home.  Denies prior urologic evaluation.  He was hospitalized at [facility] in October 2019 for shortness of breath and acute kidney injury superimposed on stage III CKD.  A renal ultrasound was performed which did show bilateral hydronephrosis and irregular thickening of the anterior bladder wall worrisome for malignancy.  He was also noted to have a large postvoid residual.  Creatinine during that hospitalization was 1.6.  Admission creatinine was 2.76 and today was 2.45.  Urinalysis showed microhematuria and pyuria.  No culture was ordered.  He is on finasteride for BPH.   Past Medical History:  Diagnosis  Date   CHF (congestive heart failure) ([place])    Chronic kidney disease (CKD), stage III (moderate) ([place])    Hypertension     Past Surgical History:  Procedure Laterality Date   AMPUTATION Left 08/03/2018   Procedure: AMPUTATION RAY SECOND TOE;  Surgeon: Sharlotte Alamo, DPM;  Location: [facility] ORS;  Service: Podiatry;  Laterality: Left;   HIP PINNING,CANNULATED Left 10/19/2018   Procedure: CANNULATED HIP PINNING;  Surgeon: Hessie Knows, MD;  Location: [facility] ORS;  Service: Orthopedics;  Laterality: Left;   IRRIGATION AND DEBRIDEMENT FOOT Left 08/05/2018   Procedure: IRRIGATION AND DEBRIDEMENT FOOT;  Surgeon: Sharlotte Alamo, DPM;  Location: [facility] ORS;  Service: Podiatry;  Laterality: Left;   TOE AMPUTATION      Home Medications:  Current Meds  Medication Sig   apixaban (ELIQUIS) 5 MG TABS tablet Take 1 tablet (5 mg total) by mouth 2 (two) times daily.   aspirin EC 81 MG tablet Take 81 mg by mouth daily.   digoxin (LANOXIN) 0.25 MG tablet Take 0.25 mg by mouth daily.   docusate sodium (COLACE) 100 MG capsule Take 100 mg by mouth 2 (two) times daily.   finasteride (PROSCAR) 5 MG tablet Take 1 tablet (5 mg total) by mouth daily.   gabapentin (NEURONTIN) 300 MG capsule Take 300 mg by mouth 2 (two) times daily.    hydrOXYzine (ATARAX/VISTARIL) 25 MG tablet Take 25 mg by mouth every 8 (eight) hours as needed for itching.    loratadine (CLARITIN) 10 MG tablet Take 10 mg by mouth daily.   LORazepam (ATIVAN) 0.5 MG tablet Take 0.5 tablets (0.25 mg  total) by mouth daily. (Patient taking differently: Take 0.5 mg by mouth daily. )    Allergies: No Known Allergies  History reviewed. No pertinent family history.  Social History:  reports that he has been smoking. He has never used smokeless tobacco. He reports that he does not drink alcohol or use drugs.  ROS: A complete review of systems was performed.  All systems are negative except for pertinent findings as noted.  Physical Exam:  Vital  signs in last 24 hours: Temp:  [98 F (36.7 C)-98.4 F (36.9 C)] 98.2 F (36.8 C) (08/07 0753) Pulse Rate:  [72-82] 72 (08/07 0753) Resp:  [20] 20 (08/07 0401) BP: (101-124)/(58-76) 124/62 (08/07 0753) SpO2:  [100 %] 100 % (08/07 0753) Weight:  [66.8 kg] 66.8 kg (08/07 0401) Constitutional:  Alert, No acute distress HEENT: [place] AT, moist mucus membranes.  Trachea midline, no masses Cardiovascular: Regular rate and rhythm, no clubbing, cyanosis, or edema. Respiratory: Normal respiratory effort, lungs clear bilaterally GI: Abdomen is soft, nontender, mass palpable to umbilicus consistent with bladder distention. GU: No CVA tenderness Skin: No rashes, bruises or suspicious lesions Neurologic: Grossly intact, no focal deficits, moving all 4 extremities  Laboratory Data:  Recent Labs    11/10/18 1631 11/11/18 0539 11/12/18 0636  WBC 13.6* 14.2* 12.4*  HGB 10.4* 9.6* 9.8*  HCT 32.8* 30.6* 32.1*   Recent Labs    11/10/18 1631 11/11/18 0539 11/12/18 0636  NA 137 139 142  K 4.6 4.6 4.5  CL 108 109 116*  CO2 19* 21* 19*  GLUCOSE 141* 93 72  BUN 55* 54* 48*  CREATININE 2.76* 2.67* 2.45*  CALCIUM 14.2* 13.1* 13.2*   No results for input(s): LABPT, INR in the last 72 hours. No results for input(s): LABURIN in the last 72 hours. Results for orders placed or performed during the hospital encounter of 11/10/18  SARS Coronavirus 2 [facility] order, Performed in [facility] hospital lab) Nasopharyngeal Nasopharyngeal Swab     Status: None   Collection Time: 11/10/18  6:40 PM   Specimen: Nasopharyngeal Swab  Result Value Ref Range Status   SARS Coronavirus 2 NEGATIVE NEGATIVE Final    Comment: (NOTE) If result is NEGATIVE SARS-CoV-2 target nucleic acids are NOT DETECTED. The SARS-CoV-2 RNA is generally detectable in upper and lower  respiratory specimens during the acute phase of infection. The lowest  concentration of SARS-CoV-2 viral copies this assay can detect is 250  copies /  mL. A negative result does not preclude SARS-CoV-2 infection  and should not be used as the sole basis for treatment or other  patient management decisions.  A negative result may occur with  improper specimen collection / handling, submission of specimen other  than nasopharyngeal swab, presence of viral mutation(s) within the  areas targeted by this assay, and inadequate number of viral copies  (<250 copies / mL). A negative result must be combined with clinical  observations, patient history, and epidemiological information. If result is POSITIVE SARS-CoV-2 target nucleic acids are DETECTED. The SARS-CoV-2 RNA is generally detectable in upper and lower  respiratory specimens dur ing the acute phase of infection.  Positive  results are indicative of active infection with SARS-CoV-2.  Clinical  correlation with patient history and other diagnostic information is  necessary to determine patient infection status.  Positive results do  not rule out bacterial infection or co-infection with other viruses. If result is PRESUMPTIVE POSTIVE SARS-CoV-2 nucleic acids MAY BE PRESENT.   A presumptive positive result  was obtained on the submitted specimen  and confirmed on repeat testing.  While 2019 novel coronavirus  (SARS-CoV-2) nucleic acids may be present in the submitted sample  additional confirmatory testing may be necessary for epidemiological  and / or clinical management purposes  to differentiate between  SARS-CoV-2 and other Sarbecovirus currently known to infect humans.  If clinically indicated additional testing with an alternate test  methodology [phone]) is advised. The SARS-CoV-2 RNA is generally  detectable in upper and lower respiratory sp ecimens during the acute  phase of infection. The expected result is Negative. Fact Sheet for Patients:  StrictlyIdeas.no Fact Sheet for Healthcare Providers: BankingDealers.co.za This test is  not yet approved or cleared by the [place] FDA and has been authorized for detection and/or diagnosis of SARS-CoV-2 by FDA under an Emergency Use Authorization (EUA).  This EUA will remain in effect (meaning this test can be used) for the duration of the COVID-19 declaration under Section 564(b)(1) of the Act, 21 U.S.C. section 360bbb-3(b)(1), unless the authorization is terminated or revoked sooner. Performed at [facility], [address] [place], [place] [address]      Radiologic Imaging: Renal ultrasound and CT personally reviewed  Ct Abdomen Pelvis Wo Contrast  Result Date: 11/12/2018 CLINICAL DATA:  Abnormal renal bladder ultrasound prior day EXAM: CT ABDOMEN AND PELVIS WITHOUT CONTRAST TECHNIQUE: Multidetector CT imaging of the abdomen and pelvis was performed following the standard protocol without IV contrast. Oral contrast was received. COMPARISON:  Ultrasound November 11, 2018 FINDINGS: Lower chest: The visualized heart size within normal limits. No pericardial fluid/thickening. No hiatal hernia. The visualized portions of the lungs are clear. Hepatobiliary: There are multiple low-density lesions seen throughout the liver parenchyma the largest measuring 6.2 cm within the caudate lobe no evidence of calcified gallstones, gallbladder wall thickening or biliary dilatation. Pancreas: Unremarkable. No pancreatic ductal dilatation or surrounding inflammatory changes. Spleen: Normal in size without focal abnormality. Adrenals/Urinary Tract: There is mild thickening of the bilateral adrenal glands without a focal abnormality. There is marked severe bilateral pelvicaliectasis and ureterectasis down to the level of the UVJ. There is a heterogeneous hyperdense large lobular bladder wall mass seen superiorly which protrudes and displaces several loops of small bowel. There is also a downward mass effect seen upon the prostate gland. The bladder is grossly distended with fluid. Within  the upper pole of the right kidney there is a 1.8 cm low-density lesion. There is a 6 mm calculus seen in the upper pole the left kidney. Stomach/Bowel: The stomach, small bowel, and colon are normal in appearance. A moderate amount of colonic stool is present. No definite areas of bowel wall thickening. Vascular/Lymphatic: No definite mesenteric or pelvic sidewall adenopathy seen. Scattered aortic and iliac atherosclerotic calcifications are seen without aneurysmal dilatation. Reproductive: Downward displacement due to the large bladder wall mass. Other: No evidence of abdominal wall mass or hernia. Musculoskel0etal: No acute or significant osseous findings. There is diffuse osteopenia. Fixation hardware seen in the left femur. IMPRESSION: 1. Large lobular soft tissue superior bladder wall mass causing marked bilateral hydronephrosis and mass effect upon small-bowel and prostate gland. 2. Multiple low-density lesions throughout the liver parenchyma the largest measuring 6 cm in the caudate lobe,unable to further evaluate due to the lack of intravenous contrast. 3. 6 mm renal calculus in the left upper pole. Electronically Signed   By: Prudencio Pair M.D.   On: 11/12/2018 12:49   Ct Head Wo Contrast  Result Date: 11/10/2018 CLINICAL DATA:  Altered level of consciousness. EXAM: CT HEAD WITHOUT CONTRAST TECHNIQUE: Contiguous axial images were obtained from the base of the skull through the vertex without intravenous contrast. COMPARISON:  None. FINDINGS: Brain: Mild atrophy and minimal small vessel disease. The brain demonstrates no evidence of hemorrhage, infarction, edema, mass effect, extra-axial fluid collection, hydrocephalus or mass lesion. Vascular: No hyperdense vessel or unexpected calcification. Skull: Normal. Negative for fracture or focal lesion. Sinuses/Orbits: No acute finding. Other: None. IMPRESSION: Mild atrophy and small vessel disease.  No acute findings. Electronically Signed   By: Aletta Edouard  M.D.   On: 11/10/2018 17:55   US Renal  Result Date: 11/11/2018 CLINICAL DATA:  63 year old male with acute renal failure. EXAM: RENAL / URINARY TRACT ULTRASOUND COMPLETE COMPARISON:  None. FINDINGS: Right Kidney: Renal measurements: 11.6 x 5.9 x 6.8 centimeters = volume: 242 mL. Moderate to severe right hydronephrosis (image 2). Superimposed echogenic layering debris suspected within the dilated collecting system (see power Doppler image 11). Grossly normal renal cortical echogenicity. No right renal mass identified. Left Kidney: Renal measurements: 11.3 x 5.6 x 4.7 centimeters = volume: 155 mL. Moderate to severe hydronephrosis with similar echogenic nonvascular debris throughout the collecting system (image 66). Suggestion of a superimposed shadowing calculus up to 10 millimeters (image 75). Grossly normal left cortical echogenicity. Bladder: Highly abnormal appearance of the bladder with what on grayscale imaging seems to be fungating mass, but with the application of Doppler (image 94) seems to be nonvascular tumefactive debris. The technologist reports the patient was unable to void. IMPRESSION: 1. Severely abnormal ultrasound appearance of the bladder and both kidneys. 2. Severe Bilateral Hydronephrosis with what appears to be bulky layering and adherent Urinary Debris throughout the both dilated collecting systems and the bladder. 3. Underlying bladder tumor is difficult to exclude. Recommend correlation with urinalysis and consider Urology consultation. Electronically Signed   By: Genevie Ann M.D.   On: 11/11/2018 17:13   Dg Chest Portable 1 View  Result Date: 11/10/2018 CLINICAL DATA:  Shortness of breath for the past 2-3 days. Smoker. EXAM: PORTABLE CHEST 1 VIEW COMPARISON:  10/19/2018. FINDINGS: Normal sized heart. Clear lungs with normal vascularity. Small calcified granuloma in the right lower lung zone. Unremarkable bones. IMPRESSION: No acute abnormality. Electronically Signed   By: Claudie Revering  M.D.   On: 11/10/2018 17:11    Impression:   - Bilateral hydronephrosis/hydroureter, severe Most likely due to chronic bladder distention and ureteral obstruction secondary to bladder tumor.  Originally noted October 2019 and bladder mass at that time was anterior.  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  His Eliquis has been discontinued and will add on to my OR schedule mid next week  - Urine culture ordered in anticipation of the above procedure  - The patient was seen by palliative care and declined any further treatment and only comfort care however he has a legal guardian who desires all possible care   11/12/2018, 3:54 PM  John Giovanni,  MD

## 2018-11-12 NOTE — Progress Notes (Signed)
Patient abdomen slightly distended however patient states that he is not uncomfortable nor does he have the urge to urinate. Bladder scan shows 359 mL. Urine in drainage bag. Per nurse driven catheter policy, no intervention needed until 400 mL. Will continue to monitor.  Update: AM bladder scan shows 463. Patient still is not c/o of discomfort nor the urge to urinate. MD Marcille Blanco notified. Order given for I/O cath. Patient educated, and agreeable.   Update: I/O cath performed. 260 mL obtained. Urine cloudy and malodorous. Patient tolerated procedure fairly well. All questions answered.  Update: 24 hour Urine to start at 0600. Collection container at bedside.   Ruben Clark M

## 2018-11-12 NOTE — Plan of Care (Signed)
  Problem: Clinical Measurements: Goal: Cardiovascular complication will be avoided Outcome: Progressing   Problem: Pain Managment: Goal: General experience of comfort will improve Outcome: Progressing   Problem: Safety: Goal: Ability to remain free from injury will improve Outcome: Progressing   Problem: Skin Integrity: Goal: Risk for impaired skin integrity will decrease Outcome: Progressing   

## 2018-11-12 NOTE — Progress Notes (Signed)
[place] [facility] [place] NAME: Oneal Schoenberger    MR#:  [id number]  DATE OF BIRTH:  [birth year]  SUBJECTIVE:  CHIEF COMPLAINT:   Chief Complaint  Patient presents with   Weakness  significant weight loss, no appetite,  weak REVIEW OF SYSTEMS:  Review of Systems  Constitutional: Positive for malaise/fatigue and weight loss. Negative for diaphoresis and fever.  HENT: Negative for ear discharge, ear pain, hearing loss, nosebleeds, sore throat and tinnitus.   Eyes: Negative for blurred vision and pain.  Respiratory: Negative for cough, hemoptysis, shortness of breath and wheezing.   Cardiovascular: Negative for chest pain, palpitations, orthopnea and leg swelling.  Gastrointestinal: Negative for abdominal pain, blood in stool, constipation, diarrhea, heartburn, nausea and vomiting.  Genitourinary: Negative for dysuria, frequency and urgency.  Musculoskeletal: Negative for back pain and myalgias.  Skin: Negative for itching and rash.  Neurological: Negative for dizziness, tingling, tremors, focal weakness, seizures, weakness and headaches.  Psychiatric/Behavioral: Negative for depression. The patient is not nervous/anxious.    DRUG ALLERGIES:  No Known Allergies VITALS:  Blood pressure 124/62, pulse 72, temperature 98.2 F (36.8 C), temperature source Oral, resp. rate 20, height 5\' 10"  (1.778 m), weight 66.8 kg, SpO2 100 %. PHYSICAL EXAMINATION:  Physical Exam Constitutional:      Appearance: He is cachectic. He is ill-appearing.  HENT:     Head: Normocephalic and atraumatic.  Eyes:     Conjunctiva/sclera: Conjunctivae normal.     Pupils: Pupils are equal, round, and reactive to light.  Neck:     Musculoskeletal: Normal range of motion and neck supple.     Thyroid: No thyromegaly.     Trachea: No tracheal deviation.  Cardiovascular:     Rate and Rhythm: Normal rate and regular rhythm.     Heart sounds: Normal heart sounds.    Pulmonary:     Effort: Pulmonary effort is normal. No respiratory distress.     Breath sounds: Normal breath sounds. No wheezing.  Chest:     Chest wall: No tenderness.  Abdominal:     General: Bowel sounds are normal. There is no distension.     Palpations: Abdomen is soft.     Tenderness: There is no abdominal tenderness.  Musculoskeletal: Normal range of motion.  Skin:    General: Skin is warm and dry.     Findings: No rash.  Neurological:     Mental Status: He is alert and oriented to person, place, and time.     Cranial Nerves: No cranial nerve deficit.    LABORATORY PANEL:  Male CBC Recent Labs  Lab 11/12/18 0636  WBC 12.4*  HGB 9.8*  HCT 32.1*  PLT 468*   ------------------------------------------------------------------------------------------------------------------ Chemistries  Recent Labs  Lab 11/10/18 1840  11/12/18 0636  NA  --    < > 142  K  --    < > 4.5  CL  --    < > 116*  CO2  --    < > 19*  GLUCOSE  --    < > 72  BUN  --    < > 48*  CREATININE  --    < > 2.45*  CALCIUM  --    < > 13.2*  MG 2.4  --   --    < > = values in this interval not displayed.   RADIOLOGY:  Ct Abdomen Pelvis Wo Contrast  Result Date:  11/12/2018 CLINICAL DATA:  Abnormal renal bladder ultrasound prior day EXAM: CT ABDOMEN AND PELVIS WITHOUT CONTRAST TECHNIQUE: Multidetector CT imaging of the abdomen and pelvis was performed following the standard protocol without IV contrast. Oral contrast was received. COMPARISON:  Ultrasound November 11, 2018 FINDINGS: Lower chest: The visualized heart size within normal limits. No pericardial fluid/thickening. No hiatal hernia. The visualized portions of the lungs are clear. Hepatobiliary: There are multiple low-density lesions seen throughout the liver parenchyma the largest measuring 6.2 cm within the caudate lobe no evidence of calcified gallstones, gallbladder wall thickening or biliary dilatation. Pancreas: Unremarkable. No pancreatic  ductal dilatation or surrounding inflammatory changes. Spleen: Normal in size without focal abnormality. Adrenals/Urinary Tract: There is mild thickening of the bilateral adrenal glands without a focal abnormality. There is marked severe bilateral pelvicaliectasis and ureterectasis down to the level of the UVJ. There is a heterogeneous hyperdense large lobular bladder wall mass seen superiorly which protrudes and displaces several loops of small bowel. There is also a downward mass effect seen upon the prostate gland. The bladder is grossly distended with fluid. Within the upper pole of the right kidney there is a 1.8 cm low-density lesion. There is a 6 mm calculus seen in the upper pole the left kidney. Stomach/Bowel: The stomach, small bowel, and colon are normal in appearance. A moderate amount of colonic stool is present. No definite areas of bowel wall thickening. Vascular/Lymphatic: No definite mesenteric or pelvic sidewall adenopathy seen. Scattered aortic and iliac atherosclerotic calcifications are seen without aneurysmal dilatation. Reproductive: Downward displacement due to the large bladder wall mass. Other: No evidence of abdominal wall mass or hernia. Musculoskel0etal: No acute or significant osseous findings. There is diffuse osteopenia. Fixation hardware seen in the left femur. IMPRESSION: 1. Large lobular soft tissue superior bladder wall mass causing marked bilateral hydronephrosis and mass effect upon small-bowel and prostate gland. 2. Multiple low-density lesions throughout the liver parenchyma the largest measuring 6 cm in the caudate lobe,unable to further evaluate due to the lack of intravenous contrast. 3. 6 mm renal calculus in the left upper pole. Electronically Signed   By: Prudencio Pair M.D.   On: 11/12/2018 12:49   US Renal  Result Date: 11/11/2018 CLINICAL DATA:  63 year old male with acute renal failure. EXAM: RENAL / URINARY TRACT ULTRASOUND COMPLETE COMPARISON:  None. FINDINGS:  Right Kidney: Renal measurements: 11.6 x 5.9 x 6.8 centimeters = volume: 242 mL. Moderate to severe right hydronephrosis (image 2). Superimposed echogenic layering debris suspected within the dilated collecting system (see power Doppler image 11). Grossly normal renal cortical echogenicity. No right renal mass identified. Left Kidney: Renal measurements: 11.3 x 5.6 x 4.7 centimeters = volume: 155 mL. Moderate to severe hydronephrosis with similar echogenic nonvascular debris throughout the collecting system (image 66). Suggestion of a superimposed shadowing calculus up to 10 millimeters (image 75). Grossly normal left cortical echogenicity. Bladder: Highly abnormal appearance of the bladder with what on grayscale imaging seems to be fungating mass, but with the application of Doppler (image 94) seems to be nonvascular tumefactive debris. The technologist reports the patient was unable to void. IMPRESSION: 1. Severely abnormal ultrasound appearance of the bladder and both kidneys. 2. Severe Bilateral Hydronephrosis with what appears to be bulky layering and adherent Urinary Debris throughout the both dilated collecting systems and the bladder. 3. Underlying bladder tumor is difficult to exclude. Recommend correlation with urinalysis and consider Urology consultation. Electronically Signed   By: Genevie Ann M.D.   On: 11/11/2018 17:13  ASSESSMENT AND PLAN:  63 year old male patient with a known history of hip fracture, chronic congestive heart failure, CKD stage III, hypertension, chronic afib on eliquis was referred from group home.  Patient recently had hip fracture which was repaired and sent to rehab.  Day ago patient was sent from rehab group home.  He has generalized weakness.    * Acute hypercalcemia and significant unintentional weight loss -Improved some with IV fluid hydration with normal saline Follow-up calcium levels -Worrisome for underlying malignancy. 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  Likely postobstructive with some prerenal component -Monitor while on IV fluids Avoid nephrotoxic meds -Nephrology input appreciated  -DVT prophylaxis On anticoagulation with Eliquis  -Chronic Afib Continue eliquis for anticoagulation  -Tobacco abuse Tobacco cessation counseled for six minutes by admitting doctor Nicotine patch offered, but patient did not want it.  -Covid 19 test negative  Overall very poor prognosis. 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 Management plans discussed with the patient, nursing and they are in agreement.  CODE STATUS: Full Code  TOTAL TIME TAKING CARE OF THIS PATIENT: 35 minutes.   More than 50% of the time was spent in counseling/coordination of care: YES  POSSIBLE D/C IN 2-3 DAYS, DEPENDING ON CLINICAL CONDITION.   Max Sane M.D on 11/12/2018 at 1:52 PM  Between 7am to 6pm - Pager - [phone]  After 6pm go to [email or web address] - Proofreader  [facility]  Office  [phone]  CC: Primary care physician; System, Pcp Not In  Note: This dictation was prepared with Dragon dictation along with smaller phrase technology. Any transcriptional errors that result from this process are unintentional.

## 2018-11-12 NOTE — Progress Notes (Signed)
Patient removed condom cath and refusing to let nursing put back on.  Patient inc of large amount of urine in bed.  Unable to collect sample for 24 hr urine. Will notify MD

## 2018-11-12 NOTE — TOC Progression Note (Addendum)
Transition of Care [facility]) - Progression Note    Patient Details  Name: Ruben Clark MRN: [id number] Date of Birth: 1955/08/25  [facility]) CM/SW Contact  Katrina Stack, RN Phone Number: 11/12/2018, 7:34 PM  Clinical Narrative:   PT consult is pending due top high calcium.  [name] returned call to [facility] and disagrees that [facility] has stated patient could not be accepted back to facility. Discussed the PT consult was pending.  Patient does not have medicaid for long term care nursing stay and per [place]  patient has been denied medicaid x 2. [facility] received referral from [facility] but has not seen as patient readmitted same day discharged from skilled facility      Barriers to Discharge: Other (comment)(Unsure if [facility] can take him back. Do not know if he has medicaid for long term care.  May need higher level)  Expected Discharge Plan and Services         Living arrangements for the past 2 months: [place], [place] Expected Discharge Date: 11/12/18                                     Social Determinants of Health (SDOH) Interventions    Readmission Risk Interventions Readmission Risk Prevention Plan 10/10/2018  Post Dischage Appt Complete  Medication Screening Complete  Transportation Screening Complete

## 2018-11-12 NOTE — H&P (View-Only) (Signed)
Urology Consult  Chief Complaint: Weakness  History of Present Illness: Ruben Clark is a 63 y.o. year old male with a history of chronic systolic congestive heart failure, CKD stage III, chronic atrial fibrillation on Eliquis and hypertension seen in consultation at the request of Dr. Manuella Ghazi for evaluation of severe bilateral hydronephrosis, weakness, weight loss and possible bladder tumor.  He was recently hospitalized here with a left hip fracture and underwent surgical management.  He was discharged to rehab and subsequently transferred to a group home however when he got there he was not ambulatory and was sent to the ED.  He was found to have hypercalcemia and dehydration and was admitted to the hospitalist service  A renal ultrasound showed severe bilateral hydronephrosis and a CT of the abdomen pelvis without contrast performed today showed bilateral hydronephrosis and hydroureter to the bladder.  The bladder was distended with a large, multifocal mass.  Liver lesions were noted however could not be evaluated without contrast.  He had a nonobstructing left renal calculus.  The patient has no complaints.  He denies flank, abdominal or pelvic pain.  He states he has no voiding complaints and denies gross hematuria.  He stated that he just wanted to be discharged and go home.  Denies prior urologic evaluation.  He was hospitalized at [facility] in October 2019 for shortness of breath and acute kidney injury superimposed on stage III CKD.  A renal ultrasound was performed which did show bilateral hydronephrosis and irregular thickening of the anterior bladder wall worrisome for malignancy.  He was also noted to have a large postvoid residual.  Creatinine during that hospitalization was 1.6.  Admission creatinine was 2.76 and today was 2.45.  Urinalysis showed microhematuria and pyuria.  No culture was ordered.  He is on finasteride for BPH.   Past Medical History:  Diagnosis  Date   CHF (congestive heart failure) ([place])    Chronic kidney disease (CKD), stage III (moderate) ([place])    Hypertension     Past Surgical History:  Procedure Laterality Date   AMPUTATION Left 08/03/2018   Procedure: AMPUTATION RAY SECOND TOE;  Surgeon: Sharlotte Alamo, DPM;  Location: [facility] ORS;  Service: Podiatry;  Laterality: Left;   HIP PINNING,CANNULATED Left 10/19/2018   Procedure: CANNULATED HIP PINNING;  Surgeon: Hessie Knows, MD;  Location: [facility] ORS;  Service: Orthopedics;  Laterality: Left;   IRRIGATION AND DEBRIDEMENT FOOT Left 08/05/2018   Procedure: IRRIGATION AND DEBRIDEMENT FOOT;  Surgeon: Sharlotte Alamo, DPM;  Location: [facility] ORS;  Service: Podiatry;  Laterality: Left;   TOE AMPUTATION      Home Medications:  Current Meds  Medication Sig   apixaban (ELIQUIS) 5 MG TABS tablet Take 1 tablet (5 mg total) by mouth 2 (two) times daily.   aspirin EC 81 MG tablet Take 81 mg by mouth daily.   digoxin (LANOXIN) 0.25 MG tablet Take 0.25 mg by mouth daily.   docusate sodium (COLACE) 100 MG capsule Take 100 mg by mouth 2 (two) times daily.   finasteride (PROSCAR) 5 MG tablet Take 1 tablet (5 mg total) by mouth daily.   gabapentin (NEURONTIN) 300 MG capsule Take 300 mg by mouth 2 (two) times daily.    hydrOXYzine (ATARAX/VISTARIL) 25 MG tablet Take 25 mg by mouth every 8 (eight) hours as needed for itching.    loratadine (CLARITIN) 10 MG tablet Take 10 mg by mouth daily.   LORazepam (ATIVAN) 0.5 MG tablet Take 0.5 tablets (0.25 mg  total) by mouth daily. (Patient taking differently: Take 0.5 mg by mouth daily. )    Allergies: No Known Allergies  History reviewed. No pertinent family history.  Social History:  reports that he has been smoking. He has never used smokeless tobacco. He reports that he does not drink alcohol or use drugs.  ROS: A complete review of systems was performed.  All systems are negative except for pertinent findings as noted.  Physical Exam:  Vital  signs in last 24 hours: Temp:  [98 F (36.7 C)-98.4 F (36.9 C)] 98.2 F (36.8 C) (08/07 0753) Pulse Rate:  [72-82] 72 (08/07 0753) Resp:  [20] 20 (08/07 0401) BP: (101-124)/(58-76) 124/62 (08/07 0753) SpO2:  [100 %] 100 % (08/07 0753) Weight:  [66.8 kg] 66.8 kg (08/07 0401) Constitutional:  Alert, No acute distress HEENT: [place] AT, moist mucus membranes.  Trachea midline, no masses Cardiovascular: Regular rate and rhythm, no clubbing, cyanosis, or edema. Respiratory: Normal respiratory effort, lungs clear bilaterally GI: Abdomen is soft, nontender, mass palpable to umbilicus consistent with bladder distention. GU: No CVA tenderness Skin: No rashes, bruises or suspicious lesions Neurologic: Grossly intact, no focal deficits, moving all 4 extremities  Laboratory Data:  Recent Labs    11/10/18 1631 11/11/18 0539 11/12/18 0636  WBC 13.6* 14.2* 12.4*  HGB 10.4* 9.6* 9.8*  HCT 32.8* 30.6* 32.1*   Recent Labs    11/10/18 1631 11/11/18 0539 11/12/18 0636  NA 137 139 142  K 4.6 4.6 4.5  CL 108 109 116*  CO2 19* 21* 19*  GLUCOSE 141* 93 72  BUN 55* 54* 48*  CREATININE 2.76* 2.67* 2.45*  CALCIUM 14.2* 13.1* 13.2*   No results for input(s): LABPT, INR in the last 72 hours. No results for input(s): LABURIN in the last 72 hours. Results for orders placed or performed during the hospital encounter of 11/10/18  SARS Coronavirus 2 [facility] order, Performed in [place] hospital lab) Nasopharyngeal Nasopharyngeal Swab     Status: None   Collection Time: 11/10/18  6:40 PM   Specimen: Nasopharyngeal Swab  Result Value Ref Range Status   SARS Coronavirus 2 NEGATIVE NEGATIVE Final    Comment: (NOTE) If result is NEGATIVE SARS-CoV-2 target nucleic acids are NOT DETECTED. The SARS-CoV-2 RNA is generally detectable in upper and lower  respiratory specimens during the acute phase of infection. The lowest  concentration of SARS-CoV-2 viral copies this assay can detect is 250  copies /  mL. A negative result does not preclude SARS-CoV-2 infection  and should not be used as the sole basis for treatment or other  patient management decisions.  A negative result may occur with  improper specimen collection / handling, submission of specimen other  than nasopharyngeal swab, presence of viral mutation(s) within the  areas targeted by this assay, and inadequate number of viral copies  (<250 copies / mL). A negative result must be combined with clinical  observations, patient history, and epidemiological information. If result is POSITIVE SARS-CoV-2 target nucleic acids are DETECTED. The SARS-CoV-2 RNA is generally detectable in upper and lower  respiratory specimens dur ing the acute phase of infection.  Positive  results are indicative of active infection with SARS-CoV-2.  Clinical  correlation with patient history and other diagnostic information is  necessary to determine patient infection status.  Positive results do  not rule out bacterial infection or co-infection with other viruses. If result is PRESUMPTIVE POSTIVE SARS-CoV-2 nucleic acids MAY BE PRESENT.   A presumptive positive result  was obtained on the submitted specimen  and confirmed on repeat testing.  While 2019 novel coronavirus  (SARS-CoV-2) nucleic acids may be present in the submitted sample  additional confirmatory testing may be necessary for epidemiological  and / or clinical management purposes  to differentiate between  SARS-CoV-2 and other Sarbecovirus currently known to infect humans.  If clinically indicated additional testing with an alternate test  methodology [phone]) is advised. The SARS-CoV-2 RNA is generally  detectable in upper and lower respiratory sp ecimens during the acute  phase of infection. The expected result is Negative. Fact Sheet for Patients:  StrictlyIdeas.no Fact Sheet for Healthcare Providers: BankingDealers.co.za This test is  not yet approved or cleared by the [place] FDA and has been authorized for detection and/or diagnosis of SARS-CoV-2 by FDA under an Emergency Use Authorization (EUA).  This EUA will remain in effect (meaning this test can be used) for the duration of the COVID-19 declaration under Section 564(b)(1) of the Act, 21 U.S.C. section 360bbb-3(b)(1), unless the authorization is terminated or revoked sooner. Performed at [facility], [address], [place], [place] [address]      Radiologic Imaging: Renal ultrasound and CT personally reviewed  Ct Abdomen Pelvis Wo Contrast  Result Date: 11/12/2018 CLINICAL DATA:  Abnormal renal bladder ultrasound prior day EXAM: CT ABDOMEN AND PELVIS WITHOUT CONTRAST TECHNIQUE: Multidetector CT imaging of the abdomen and pelvis was performed following the standard protocol without IV contrast. Oral contrast was received. COMPARISON:  Ultrasound November 11, 2018 FINDINGS: Lower chest: The visualized heart size within normal limits. No pericardial fluid/thickening. No hiatal hernia. The visualized portions of the lungs are clear. Hepatobiliary: There are multiple low-density lesions seen throughout the liver parenchyma the largest measuring 6.2 cm within the caudate lobe no evidence of calcified gallstones, gallbladder wall thickening or biliary dilatation. Pancreas: Unremarkable. No pancreatic ductal dilatation or surrounding inflammatory changes. Spleen: Normal in size without focal abnormality. Adrenals/Urinary Tract: There is mild thickening of the bilateral adrenal glands without a focal abnormality. There is marked severe bilateral pelvicaliectasis and ureterectasis down to the level of the UVJ. There is a heterogeneous hyperdense large lobular bladder wall mass seen superiorly which protrudes and displaces several loops of small bowel. There is also a downward mass effect seen upon the prostate gland. The bladder is grossly distended with fluid. Within  the upper pole of the right kidney there is a 1.8 cm low-density lesion. There is a 6 mm calculus seen in the upper pole the left kidney. Stomach/Bowel: The stomach, small bowel, and colon are normal in appearance. A moderate amount of colonic stool is present. No definite areas of bowel wall thickening. Vascular/Lymphatic: No definite mesenteric or pelvic sidewall adenopathy seen. Scattered aortic and iliac atherosclerotic calcifications are seen without aneurysmal dilatation. Reproductive: Downward displacement due to the large bladder wall mass. Other: No evidence of abdominal wall mass or hernia. Musculoskel0etal: No acute or significant osseous findings. There is diffuse osteopenia. Fixation hardware seen in the left femur. IMPRESSION: 1. Large lobular soft tissue superior bladder wall mass causing marked bilateral hydronephrosis and mass effect upon small-bowel and prostate gland. 2. Multiple low-density lesions throughout the liver parenchyma the largest measuring 6 cm in the caudate lobe,unable to further evaluate due to the lack of intravenous contrast. 3. 6 mm renal calculus in the left upper pole. Electronically Signed   By: Prudencio Pair M.D.   On: 11/12/2018 12:49   Ct Head Wo Contrast  Result Date: 11/10/2018 CLINICAL DATA:  Altered level of consciousness. EXAM: CT HEAD WITHOUT CONTRAST TECHNIQUE: Contiguous axial images were obtained from the base of the skull through the vertex without intravenous contrast. COMPARISON:  None. FINDINGS: Brain: Mild atrophy and minimal small vessel disease. The brain demonstrates no evidence of hemorrhage, infarction, edema, mass effect, extra-axial fluid collection, hydrocephalus or mass lesion. Vascular: No hyperdense vessel or unexpected calcification. Skull: Normal. Negative for fracture or focal lesion. Sinuses/Orbits: No acute finding. Other: None. IMPRESSION: Mild atrophy and small vessel disease.  No acute findings. Electronically Signed   By: Aletta Edouard  M.D.   On: 11/10/2018 17:55   US Renal  Result Date: 11/11/2018 CLINICAL DATA:  62 year old male with acute renal failure. EXAM: RENAL / URINARY TRACT ULTRASOUND COMPLETE COMPARISON:  None. FINDINGS: Right Kidney: Renal measurements: 11.6 x 5.9 x 6.8 centimeters = volume: 242 mL. Moderate to severe right hydronephrosis (image 2). Superimposed echogenic layering debris suspected within the dilated collecting system (see power Doppler image 11). Grossly normal renal cortical echogenicity. No right renal mass identified. Left Kidney: Renal measurements: 11.3 x 5.6 x 4.7 centimeters = volume: 155 mL. Moderate to severe hydronephrosis with similar echogenic nonvascular debris throughout the collecting system (image 66). Suggestion of a superimposed shadowing calculus up to 10 millimeters (image 75). Grossly normal left cortical echogenicity. Bladder: Highly abnormal appearance of the bladder with what on grayscale imaging seems to be fungating mass, but with the application of Doppler (image 94) seems to be nonvascular tumefactive debris. The technologist reports the patient was unable to void. IMPRESSION: 1. Severely abnormal ultrasound appearance of the bladder and both kidneys. 2. Severe Bilateral Hydronephrosis with what appears to be bulky layering and adherent Urinary Debris throughout the both dilated collecting systems and the bladder. 3. Underlying bladder tumor is difficult to exclude. Recommend correlation with urinalysis and consider Urology consultation. Electronically Signed   By: Genevie Ann M.D.   On: 11/11/2018 17:13   Dg Chest Portable 1 View  Result Date: 11/10/2018 CLINICAL DATA:  Shortness of breath for the past 2-3 days. Smoker. EXAM: PORTABLE CHEST 1 VIEW COMPARISON:  10/19/2018. FINDINGS: Normal sized heart. Clear lungs with normal vascularity. Small calcified granuloma in the right lower lung zone. Unremarkable bones. IMPRESSION: No acute abnormality. Electronically Signed   By: Claudie Revering  M.D.   On: 11/10/2018 17:11    Impression:   - Bilateral hydronephrosis/hydroureter, severe Most likely due to chronic bladder distention and ureteral obstruction secondary to bladder tumor.  Originally noted October 2019 and bladder mass at that time was anterior.  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  His Eliquis has been discontinued and will add on to my OR schedule mid next week  - Urine culture ordered in anticipation of the above procedure  - The patient was seen by palliative care and declined any further treatment and only comfort care however he has a legal guardian who desires all possible care   11/12/2018, 3:54 PM  John Giovanni,  MD

## 2018-11-12 NOTE — Progress Notes (Signed)
This RN attempted to place foley catheter on patient.  Unable to advance the catheter.  Message sent to Dr. Manuella Ghazi.  Dr. Bernardo Heater in to insert catheter at approx. 1800 with success

## 2018-11-13 LAB — BASIC METABOLIC PANEL
Anion gap: 4 — ABNORMAL LOW (ref 5–15)
BUN: 45 mg/dL — ABNORMAL HIGH (ref 8–23)
CO2: 20 mmol/L — ABNORMAL LOW (ref 22–32)
Calcium: 12.4 mg/dL — ABNORMAL HIGH (ref 8.9–10.3)
Chloride: 117 mmol/L — ABNORMAL HIGH (ref 98–111)
Creatinine, Ser: 2.28 mg/dL — ABNORMAL HIGH (ref 0.61–1.24)
GFR calc Af Amer: 34 mL/min — ABNORMAL LOW (ref >60)
GFR calc non Af Amer: 30 mL/min — ABNORMAL LOW (ref >60)
Glucose, Bld: 93 mg/dL (ref 70–99)
Potassium: 4.2 mmol/L (ref 3.5–5.1)
Sodium: 141 mmol/L (ref 135–145)

## 2018-11-13 LAB — CBC
HCT: 31.3 % — ABNORMAL LOW (ref 39.0–52.0)
Hemoglobin: 9.6 g/dL — ABNORMAL LOW (ref 13.0–17.0)
MCH: 28.6 pg (ref 26.0–34.0)
MCHC: 30.7 g/dL (ref 30.0–36.0)
MCV: 93.2 fL (ref 80.0–100.0)
Platelets: 428 10*3/uL — ABNORMAL HIGH (ref 150–400)
RBC: 3.36 MIL/uL — ABNORMAL LOW (ref 4.22–5.81)
RDW: 14.8 % (ref 11.5–15.5)
WBC: 14.5 10*3/uL — ABNORMAL HIGH (ref 4.0–10.5)
nRBC: 0 % (ref 0.0–0.2)

## 2018-11-13 LAB — ANA W/REFLEX IF POSITIVE: Anti Nuclear Antibody (ANA): NEGATIVE

## 2018-11-13 NOTE — Progress Notes (Signed)
[place] [facility] [place] NAME: Ruben Clark    MR#:  [id number]  DATE OF BIRTH:  12-Feb-1956  SUBJECTIVE:  CHIEF COMPLAINT:   Chief Complaint  Patient presents with  . Weakness  significant weight loss, Has poor appetite Generalized weakness REVIEW OF SYSTEMS:  Review of Systems  Constitutional: Positive for malaise/fatigue and weight loss. Negative for diaphoresis and fever.  HENT: Negative for ear discharge, ear pain, hearing loss, nosebleeds, sore throat and tinnitus.   Eyes: Negative for blurred vision and pain.  Respiratory: Negative for cough, hemoptysis, shortness of breath and wheezing.   Cardiovascular: Negative for chest pain, palpitations, orthopnea and leg swelling.  Gastrointestinal: Negative for abdominal pain, blood in stool, constipation, diarrhea, heartburn, nausea and vomiting.  Genitourinary: Negative for dysuria, frequency and urgency.  Musculoskeletal: Negative for back pain and myalgias.  Skin: Negative for itching and rash.  Neurological: Negative for dizziness, tingling, tremors, focal weakness, seizures, weakness and headaches.  Psychiatric/Behavioral: Negative for depression. The patient is not nervous/anxious.    DRUG ALLERGIES:  No Known Allergies VITALS:  Blood pressure 100/61, pulse 98, temperature 98.4 F (36.9 C), temperature source Oral, resp. rate 18, height 5\' 10"  (1.778 m), weight 69.5 kg, SpO2 100 %. PHYSICAL EXAMINATION:  Physical Exam Constitutional:      Appearance: He is cachectic. He is ill-appearing.  HENT:     Head: Normocephalic and atraumatic.  Eyes:     Conjunctiva/sclera: Conjunctivae normal.     Pupils: Pupils are equal, round, and reactive to light.  Neck:     Musculoskeletal: Normal range of motion and neck supple.     Thyroid: No thyromegaly.     Trachea: No tracheal deviation.  Cardiovascular:     Rate and Rhythm: Normal rate and regular rhythm.     Heart sounds: Normal  heart sounds.  Pulmonary:     Effort: Pulmonary effort is normal. No respiratory distress.     Breath sounds: Normal breath sounds. No wheezing.  Chest:     Chest wall: No tenderness.  Abdominal:     General: Bowel sounds are normal. There is no distension.     Palpations: Abdomen is soft.     Tenderness: There is no abdominal tenderness.  Musculoskeletal: Normal range of motion.  Skin:    General: Skin is warm and dry.     Findings: No rash.  Neurological:     Mental Status: He is alert and oriented to person, place, and time.     Cranial Nerves: No cranial nerve deficit.    LABORATORY PANEL:  Male CBC Recent Labs  Lab 11/13/18 0534  WBC 14.5*  HGB 9.6*  HCT 31.3*  PLT 428*   ------------------------------------------------------------------------------------------------------------------ Chemistries  Recent Labs  Lab 11/10/18 1840  11/13/18 0534  NA  --    < > 141  K  --    < > 4.2  CL  --    < > 117*  CO2  --    < > 20*  GLUCOSE  --    < > 93  BUN  --    < > 45*  CREATININE  --    < > 2.28*  CALCIUM  --    < > 12.4*  MG 2.4  --   --    < > = values in this interval not displayed.   RADIOLOGY:  No results found. ASSESSMENT AND PLAN:  63 year old  male patient with a known history of hip fracture, chronic congestive heart failure, CKD stage III, hypertension, chronic afib on eliquis was referred from group home.  Patient recently had hip fracture which was repaired and sent to rehab.  Day ago patient was sent from rehab group home.  He has generalized weakness.    * Acute hypercalcemia and significant unintentional weight loss High probability for malignancy -Improved some with IV fluid hydration with normal saline Follow-up calcium levels -Worrisome for underlying malignancy. oncology input appreciated  *Bladder mass High suspicion for urothelial malignancy -CT scan is worrisome for underlying bladder tumor -Has bilateral hydronephrosis -Appreciate  urology evaluation -Plan for cystoscopy TURBT and pyelogram on Monday -Appreciate nephrology follow-up  * Dehydration -Continue IV fluids  -Hypo-tension secondary to dehydration IV fluids and recheck blood pressure  -History of hip fracture status post ORIF Physical therapy to continue  -Acute on chronic kidney disease stage III -creatinine at 2.45 today.  Likely postobstructive with some prerenal component -Monitor while on IV fluids Avoid nephrotoxic meds -Nephrology input appreciated  -DVT prophylaxis On anticoagulation with Eliquis  -Chronic Afib Continue eliquis for anticoagulation  -Tobacco abuse Tobacco cessation counseled for six minutes by admitting doctor Nicotine patch offered, but patient did not want it.  -Covid 19 test negative  Overall very poor prognosis. 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 Management plans discussed with the patient, nursing and they are in agreement.  CODE STATUS: Full Code  TOTAL TIME TAKING CARE OF THIS PATIENT: 35 minutes.   More than 50% of the time was spent in counseling/coordination of care: YES  POSSIBLE D/C IN 2-3 DAYS, DEPENDING ON CLINICAL CONDITION.   Saundra Shelling M.D on 11/13/2018 at 1:45 PM  Between 7am to 6pm - Pager - [phone]  After 6pm go to [email or web address] - Proofreader  [facility] [place] [facility]  Office  [phone]  CC: Primary care physician; System, Pcp Not In  Note: This dictation was prepared with Dragon dictation along with smaller phrase technology. Any transcriptional errors that result from this process are unintentional.

## 2018-11-13 NOTE — Progress Notes (Signed)
Urology Consult Follow Up  Subjective:  No complaints.  Foley catheter draining cloudy urine, no blood.  Creatinine slightly better at 2.28  Anti-infectives: Anti-infectives (From admission, onward)   None      Current Facility-Administered Medications  Medication Dose Route Frequency Provider Last Rate Last Dose  . 0.9 %  sodium chloride infusion   Intravenous Continuous Pyreddy, Reatha Harps, MD 125 mL/hr at 11/13/18 0137    . acetaminophen (TYLENOL) tablet 650 mg  650 mg Oral Q6H PRN Saundra Shelling, MD   650 mg at 11/13/18 0137   Or  . acetaminophen (TYLENOL) suppository 650 mg  650 mg Rectal Q6H PRN Saundra Shelling, MD      . aspirin EC tablet 81 mg  81 mg Oral Daily Pyreddy, Reatha Harps, MD   81 mg at 11/12/18 0916  . bisacodyl (DULCOLAX) EC tablet 5 mg  5 mg Oral Daily PRN Saundra Shelling, MD      . Chlorhexidine Gluconate Cloth 2 % PADS 6 each  6 each Topical Q0600 Max Sane, MD   6 each at 11/13/18 [phone]  . digoxin (LANOXIN) tablet 0.25 mg  0.25 mg Oral Daily Pyreddy, Pavan, MD   0.25 mg at 11/12/18 0916  . docusate sodium (COLACE) capsule 100 mg  100 mg Oral BID Saundra Shelling, MD   100 mg at 11/12/18 2215  . finasteride (PROSCAR) tablet 5 mg  5 mg Oral Daily Pyreddy, Reatha Harps, MD   5 mg at 11/12/18 0916  . gabapentin (NEURONTIN) capsule 300 mg  300 mg Oral BID Saundra Shelling, MD   300 mg at 11/12/18 2215  . hydrOXYzine (ATARAX/VISTARIL) tablet 25 mg  25 mg Oral Q8H PRN Saundra Shelling, MD      . loratadine (CLARITIN) tablet 10 mg  10 mg Oral Daily Pyreddy, Reatha Harps, MD   10 mg at 11/12/18 0916  . LORazepam (ATIVAN) tablet 0.5 mg  0.5 mg Oral Daily Pyreddy, Pavan, MD   0.5 mg at 11/12/18 0916  . metoprolol succinate (TOPROL-XL) 24 hr tablet 12.5 mg  12.5 mg Oral Daily Pyreddy, Reatha Harps, MD   12.5 mg at 11/12/18 0916  . mupirocin ointment (BACTROBAN) 2 % 1 application  1 application Nasal BID Max Sane, MD   1 application at 21/30/86 2215  . ondansetron (ZOFRAN) tablet 4 mg  4 mg Oral Q6H PRN  Saundra Shelling, MD       Or  . ondansetron (ZOFRAN) injection 4 mg  4 mg Intravenous Q6H PRN Pyreddy, Reatha Harps, MD      . senna (SENOKOT) tablet 8.6 mg  1 tablet Oral Daily PRN Saundra Shelling, MD         Objective: Vital signs in last 24 hours: Temp:  [98 F (36.7 C)-98.4 F (36.9 C)] 98.4 F (36.9 C) (08/08 0759) Pulse Rate:  [73-98] 98 (08/08 0759) Resp:  [18] 18 (08/07 2014) BP: (100-135)/(61-80) 100/61 (08/08 0759) SpO2:  [93 %-100 %] 100 % (08/08 0759) Weight:  [69.5 kg] 69.5 kg (08/08 0100)  Intake/Output from previous day: 08/07 0701 - 08/08 0700 In: 0  Out: 850 [Urine:850] Intake/Output this shift: No intake/output data recorded.   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  Creatinine improved slightly.  Recommendation:  -Continue Foley catheter drainage  -Tentative cystoscopy under anesthesia next week     LOS: 3 days    Ronda Fairly [facility] 11/13/2018

## 2018-11-13 NOTE — Progress Notes (Signed)
[facility], [place] 11/13/18  Subjective:   Denies any acute complaints Appetite remains poor Renal ultrasound  showed bilateral hydronephrosis. Abdominal CT without contrast shows a bladder mass with bilateral hydronephrosis Serum creatinine remains elevated at  2.28 Calcium is still elevated but improved to 12.4  Objective:  Vital signs in last 24 hours:  Temp:  [98 F (36.7 C)-98.4 F (36.9 C)] 98.4 F (36.9 C) (08/08 0759) Pulse Rate:  [73-98] 98 (08/08 0759) Resp:  [18] 18 (08/07 2014) BP: (100-135)/(61-80) 100/61 (08/08 0759) SpO2:  [93 %-100 %] 100 % (08/08 0759) Weight:  [69.5 kg] 69.5 kg (08/08 0100)  Weight change: 2.736 kg Filed Weights   11/10/18 1625 11/12/18 0401 11/13/18 0100  Weight: 85 kg 66.8 kg 69.5 kg    Intake/Output:    Intake/Output Summary (Last 24 hours) at 11/13/2018 1302 Last data filed at 11/13/2018 0127 Gross per 24 hour  Intake 0 ml  Output 850 ml  Net -850 ml     Physical Exam: General:  Thin, cachectic gentleman, laying in the bed  HEENT  anicteric, moist oral mucous membranes  Neck  supple  Pulm/lungs  clear to auscultation, normal effort  CVS/Heart  no rub  Abdomen:   Fullness, firmness over lower abdomen  Extremities:  No edema  Neurologic:  Alert, able to answer few simple questions  Skin:  Dry skin  Foley in place  Basic Metabolic Panel:  Recent Labs  Lab 11/10/18 1631 11/10/18 1840 11/11/18 0539 11/11/18 1500 11/12/18 0636 11/13/18 0534  NA 137  --  139  --  142 141  K 4.6  --  4.6  --  4.5 4.2  CL 108  --  109  --  116* 117*  CO2 19*  --  21*  --  19* 20*  GLUCOSE 141*  --  93  --  72 93  BUN 55*  --  54*  --  48* 45*  CREATININE 2.76*  --  2.67*  --  2.45* 2.28*  CALCIUM 14.2*  --  13.1*  --  13.2* 12.4*  MG  --  2.4  --   --   --   --   PHOS  --   --   --  3.5  --   --      CBC: Recent Labs  Lab 11/10/18 1631 11/11/18 0539 11/12/18 0636 11/13/18 0534  WBC 13.6* 14.2*  12.4* 14.5*  NEUTROABS 11.0*  --   --   --   HGB 10.4* 9.6* 9.8* 9.6*  HCT 32.8* 30.6* 32.1* 31.3*  MCV 91.6 93.3 95.3 93.2  PLT 519* 437* 468* 428*      Lab Results  Component Value Date   HEPBSAG Negative 11/11/2018   HEPBSAB Non Reactive 11/11/2018      Microbiology:  Recent Results (from the past 240 hour(s))  SARS Coronavirus 2 [facility] order, Performed in [facility] hospital lab) Nasopharyngeal Nasopharyngeal Swab     Status: None   Collection Time: 11/10/18  6:40 PM   Specimen: Nasopharyngeal Swab  Result Value Ref Range Status   SARS Coronavirus 2 NEGATIVE NEGATIVE Final    Comment: (NOTE) If result is NEGATIVE SARS-CoV-2 target nucleic acids are NOT DETECTED. The SARS-CoV-2 RNA is generally detectable in upper and lower  respiratory specimens during the acute phase of infection. The lowest  concentration of SARS-CoV-2 viral copies this assay can detect is 250  copies / mL. A negative result does not preclude SARS-CoV-2  infection  and should not be used as the sole basis for treatment or other  patient management decisions.  A negative result may occur with  improper specimen collection / handling, submission of specimen other  than nasopharyngeal swab, presence of viral mutation(s) within the  areas targeted by this assay, and inadequate number of viral copies  (<250 copies / mL). A negative result must be combined with clinical  observations, patient history, and epidemiological information. If result is POSITIVE SARS-CoV-2 target nucleic acids are DETECTED. The SARS-CoV-2 RNA is generally detectable in upper and lower  respiratory specimens dur ing the acute phase of infection.  Positive  results are indicative of active infection with SARS-CoV-2.  Clinical  correlation with patient history and other diagnostic information is  necessary to determine patient infection status.  Positive results do  not rule out bacterial infection or co-infection with other  viruses. If result is PRESUMPTIVE POSTIVE SARS-CoV-2 nucleic acids MAY BE PRESENT.   A presumptive positive result was obtained on the submitted specimen  and confirmed on repeat testing.  While 2019 novel coronavirus  (SARS-CoV-2) nucleic acids may be present in the submitted sample  additional confirmatory testing may be necessary for epidemiological  and / or clinical management purposes  to differentiate between  SARS-CoV-2 and other Sarbecovirus currently known to infect humans.  If clinically indicated additional testing with an alternate test  methodology [phone]) is advised. The SARS-CoV-2 RNA is generally  detectable in upper and lower respiratory sp ecimens during the acute  phase of infection. The expected result is Negative. Fact Sheet for Patients:  StrictlyIdeas.no Fact Sheet for Healthcare Providers: BankingDealers.co.za This test is not yet approved or cleared by the [place] FDA and has been authorized for detection and/or diagnosis of SARS-CoV-2 by FDA under an Emergency Use Authorization (EUA).  This EUA will remain in effect (meaning this test can be used) for the duration of the COVID-19 declaration under Section 564(b)(1) of the Act, 21 U.S.C. section 360bbb-3(b)(1), unless the authorization is terminated or revoked sooner. Performed at [facility], [address] [place], [place] [address]     Coagulation Studies: No results for input(s): LABPROT, INR in the last 72 hours.  Urinalysis: Recent Labs    11/11/18 1850  COLORURINE YELLOW*  LABSPEC 1.011  PHURINE 6.0  GLUCOSEU NEGATIVE  HGBUR MODERATE*  BILIRUBINUR NEGATIVE  KETONESUR NEGATIVE  PROTEINUR 100*  NITRITE NEGATIVE  LEUKOCYTESUR MODERATE*      Imaging: Ct Abdomen Pelvis Wo Contrast  Result Date: 11/12/2018 CLINICAL DATA:  Abnormal renal bladder ultrasound prior day EXAM: CT ABDOMEN AND PELVIS WITHOUT CONTRAST TECHNIQUE:  Multidetector CT imaging of the abdomen and pelvis was performed following the standard protocol without IV contrast. Oral contrast was received. COMPARISON:  Ultrasound November 11, 2018 FINDINGS: Lower chest: The visualized heart size within normal limits. No pericardial fluid/thickening. No hiatal hernia. The visualized portions of the lungs are clear. Hepatobiliary: There are multiple low-density lesions seen throughout the liver parenchyma the largest measuring 6.2 cm within the caudate lobe no evidence of calcified gallstones, gallbladder wall thickening or biliary dilatation. Pancreas: Unremarkable. No pancreatic ductal dilatation or surrounding inflammatory changes. Spleen: Normal in size without focal abnormality. Adrenals/Urinary Tract: There is mild thickening of the bilateral adrenal glands without a focal abnormality. There is marked severe bilateral pelvicaliectasis and ureterectasis down to the level of the UVJ. There is a heterogeneous hyperdense large lobular bladder wall mass seen superiorly which protrudes and displaces several loops of  small bowel. There is also a downward mass effect seen upon the prostate gland. The bladder is grossly distended with fluid. Within the upper pole of the right kidney there is a 1.8 cm low-density lesion. There is a 6 mm calculus seen in the upper pole the left kidney. Stomach/Bowel: The stomach, small bowel, and colon are normal in appearance. A moderate amount of colonic stool is present. No definite areas of bowel wall thickening. Vascular/Lymphatic: No definite mesenteric or pelvic sidewall adenopathy seen. Scattered aortic and iliac atherosclerotic calcifications are seen without aneurysmal dilatation. Reproductive: Downward displacement due to the large bladder wall mass. Other: No evidence of abdominal wall mass or hernia. Musculoskel0etal: No acute or significant osseous findings. There is diffuse osteopenia. Fixation hardware seen in the left femur.  IMPRESSION: 1. Large lobular soft tissue superior bladder wall mass causing marked bilateral hydronephrosis and mass effect upon small-bowel and prostate gland. 2. Multiple low-density lesions throughout the liver parenchyma the largest measuring 6 cm in the caudate lobe,unable to further evaluate due to the lack of intravenous contrast. 3. 6 mm renal calculus in the left upper pole. Electronically Signed   By: Prudencio Pair M.D.   On: 11/12/2018 12:49   US Renal  Result Date: 11/11/2018 CLINICAL DATA:  63 year old male with acute renal failure. EXAM: RENAL / URINARY TRACT ULTRASOUND COMPLETE COMPARISON:  None. FINDINGS: Right Kidney: Renal measurements: 11.6 x 5.9 x 6.8 centimeters = volume: 242 mL. Moderate to severe right hydronephrosis (image 2). Superimposed echogenic layering debris suspected within the dilated collecting system (see power Doppler image 11). Grossly normal renal cortical echogenicity. No right renal mass identified. Left Kidney: Renal measurements: 11.3 x 5.6 x 4.7 centimeters = volume: 155 mL. Moderate to severe hydronephrosis with similar echogenic nonvascular debris throughout the collecting system (image 66). Suggestion of a superimposed shadowing calculus up to 10 millimeters (image 75). Grossly normal left cortical echogenicity. Bladder: Highly abnormal appearance of the bladder with what on grayscale imaging seems to be fungating mass, but with the application of Doppler (image 94) seems to be nonvascular tumefactive debris. The technologist reports the patient was unable to void. IMPRESSION: 1. Severely abnormal ultrasound appearance of the bladder and both kidneys. 2. Severe Bilateral Hydronephrosis with what appears to be bulky layering and adherent Urinary Debris throughout the both dilated collecting systems and the bladder. 3. Underlying bladder tumor is difficult to exclude. Recommend correlation with urinalysis and consider Urology consultation. Electronically Signed   By: Genevie Ann M.D.   On: 11/11/2018 17:13     Medications:   . sodium chloride 125 mL/hr at 11/13/18 1110   . aspirin EC  81 mg Oral Daily  . Chlorhexidine Gluconate Cloth  6 each Topical Q0600  . digoxin  0.25 mg Oral Daily  . docusate sodium  100 mg Oral BID  . finasteride  5 mg Oral Daily  . gabapentin  300 mg Oral BID  . loratadine  10 mg Oral Daily  . LORazepam  0.5 mg Oral Daily  . metoprolol succinate  12.5 mg Oral Daily  . mupirocin ointment  1 application Nasal BID   acetaminophen **OR** acetaminophen, bisacodyl, hydrOXYzine, ondansetron **OR** ondansetron (ZOFRAN) IV, senna  Assessment/ Plan:  63 y.o. male with medical problems of ch sys CHF, CKD, A Fib, HTN who is a resident of group home is admitted to [facility] on 11/10/2018 for evaluation of weakness, weight loss. Had left femoral neck fracture in 10/2018  1. AKI, bilateral hydronephrosis 2. Hypercalcemia  3. Weight loss, cachexia 4. Anemia 5.  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 Any transcription errors are unintentional

## 2018-11-13 NOTE — Plan of Care (Signed)
  Problem: Clinical Measurements: Goal: Ability to maintain clinical measurements within normal limits will improve Outcome: Progressing   Problem: Pain Managment: Goal: General experience of comfort will improve Outcome: Progressing   Problem: Safety: Goal: Ability to remain free from injury will improve Outcome: Progressing   

## 2018-11-13 NOTE — Evaluation (Signed)
Physical Therapy Evaluation Patient Details Name: Ruben Clark MRN: [id number] DOB: [birth year] Today's Date: 11/13/2018   History of Present Illness  63 y.o. male with a known history of chronic systolic congestive heart failure, CKD stage III, hypertension, chronic afib on anticoagulation with eliquis was referred from group home.  Patient mid July he had hip fracture which was repaired (ORIF) and sent to rehab, apparently he returned to group home for 1 day and was sent to [facility].  On arrival Ca+ very elevated, still high at time of PT exam but trending downward and cleared to work with PT by nursing.  Clinical Impression  Pt generally did poorly with PT exam though he was willing to try with some trepidation.  He showed good effort in trying to roll in bed but ultimately needed assist with the transition, as well as with getting to sitting EOB.  He showed good effort with standing (elevated seat height, heavy assist).  He was only able to tolerate brief bouts of standing with heavy assist and complete inability to unwieght either LE enough to take a step.  Pt had BM issue while standing and further activity deferred, nursing notified.  Pt highly limited functionally and is in no way able to manage at a group home w/o significant increase in assist, recommending return to STR to continue working on functional mobility.    Follow Up Recommendations SNF    Equipment Recommendations  None recommended by PT(pt has walker)    Recommendations for Other Services       Precautions / Restrictions Precautions Precautions: Fall Restrictions Weight Bearing Restrictions: Yes LLE Weight Bearing: Weight bearing as tolerated      Mobility  Bed Mobility Overal bed mobility: Needs Assistance Bed Mobility: Sit to Supine;Rolling;Supine to Sit     Supine to sit: Mod assist;Max assist Sit to supine: Mod assist;Max assist   General bed mobility comments: Pt struggled to roll in bed and needed significant  assist with transition to sitting EOB.  Transfers Overall transfer level: Needs assistance Equipment used: Rolling walker (2 wheeled) Transfers: Sit to/from Stand Sit to Stand: Max assist;Mod assist         General transfer comment: 2 standing at EOB efforts with poor outcome with each.  Able to stand <30 seconds each time despite heavy assist, unable to move either foot or show any weight shift despite considerable cuing and assist to [facility]  Ambulation/Gait             General Gait Details: unable to even heel-toe either foot even minimally  Stairs            Wheelchair Mobility    Modified Rankin (Stroke Patients Only)       Balance Overall balance assessment: History of Falls;Needs assistance Sitting-balance support: Feet supported Sitting balance-Leahy Scale: Fair Sitting balance - Comments: Once assisted to EOB he was able to maintain sitting but was clearly uncomfortable and lacked confidence   Standing balance support: Bilateral upper extremity supported Standing balance-Leahy Scale: Poor Standing balance comment: Pt achieved standing with heavy assist and statically maintained balance with only min assist and heavy UE use of walker but essentially showed little actual balance/tolerance in standing                             Pertinent Vitals/Pain Pain Assessment: 0-10 Pain Score: 7 (reports no pain at rest ) Pain Location: L hip and lateral thigh  Home Living Family/patient expects to be discharged to:: Unsure                      Prior Function Level of Independence: Independent with assistive device(s)         Comments: On prior admit pt states that he was ambulatory and able to manage his ADLs at group home, unable to answer PLOF questions this date     Hand Dominance        Extremity/Trunk Assessment   Upper Extremity Assessment Upper Extremity Assessment: Generalized weakness RUE Deficits / Details: 4-/5  shoulder, elbow flex/ext and shld horizontal abd/add, 4-/5 hand grip LUE Deficits / Details: 4-/5 shoulder, elbow flex/ext and shld horizontal abd/add, 4-/5 hand grip    Lower Extremity Assessment Lower Extremity Assessment: Generalized weakness(Pt able to do some AROM in LEs, but was hesitant w/ L: pain)       Communication   Communication: No difficulties  Cognition Arousal/Alertness: Awake/alert Behavior During Therapy: Flat affect Overall Cognitive Status: Difficult to assess                                 General Comments: Pt with confusion, unable to answer some basic PLOF and awareness questions      General Comments      Exercises     Assessment/Plan    PT Assessment Patient needs continued PT services  PT Problem List Decreased strength;Decreased mobility;Decreased safety awareness;Decreased activity tolerance;Decreased coordination;Decreased knowledge of precautions;Decreased balance;Decreased knowledge of use of DME;Pain;Decreased cognition;Decreased range of motion       PT Treatment Interventions DME instruction;Therapeutic activities;Gait training;Therapeutic exercise;Patient/family education;Stair training;Balance training;Functional mobility training;Neuromuscular re-education    PT Goals (Current goals can be found in the Care Plan section)  Acute Rehab PT Goals Patient Stated Goal: be able to walk PT Goal Formulation: With patient Time For Goal Achievement: 11/27/18 Potential to Achieve Goals: Fair    Frequency Min 2X/week   Barriers to discharge        Co-evaluation               AM-PAC PT "6 Clicks" Mobility  Outcome Measure Help needed turning from your back to your side while in a flat bed without using bedrails?: A Lot Help needed moving from lying on your back to sitting on the side of a flat bed without using bedrails?: A Lot Help needed moving to and from a bed to a chair (including a wheelchair)?: Total Help needed  standing up from a chair using your arms (e.g., wheelchair or bedside chair)?: A Lot Help needed to walk in hospital room?: Total Help needed climbing 3-5 steps with a railing? : Total 6 Click Score: 9    End of Session Equipment Utilized During Treatment: Gait belt Activity Tolerance: Patient limited by pain Patient left: in bed;with call bell/phone within reach;with bed alarm set;Other (comment) Nurse Communication: Mobility status(need for BM clean up) PT Visit Diagnosis: Unsteadiness on feet (R26.81);Muscle weakness (generalized) (M62.81);Difficulty in walking, not elsewhere classified (R26.2);Pain;History of falling (Z91.81) Pain - Right/Left: Left Pain - part of body: Hip    Time: 6283-1517 PT Time Calculation (min) (ACUTE ONLY): 22 min   Charges:   PT Evaluation $PT Eval Low Complexity: 1 Low          Kreg Shropshire, DPT 11/13/2018, 11:04 AM

## 2018-11-13 NOTE — Progress Notes (Signed)
[facility] Hematology/Oncology Progress Note  Date of admission: 11/10/2018  Hospital day:  11/13/2018  Chief Complaint: Ruben Clark is a 63 y.o. male with stage III chronic kidney disease, CHF, and atrial fibrillation on Eliquis who was admitted through the emergency room with weakness and a calcium of 14.2.  Subjective:  Patient denies any pain.  He wants to go home.  Social History: The patient is alone today.  His legal guardian is Ruben Clark [phone]).  Patient states that he has known him for years.  Allergies: No Known Allergies  Scheduled Medications: . aspirin EC  81 mg Oral Daily  . Chlorhexidine Gluconate Cloth  6 each Topical Q0600  . digoxin  0.25 mg Oral Daily  . docusate sodium  100 mg Oral BID  . finasteride  5 mg Oral Daily  . gabapentin  300 mg Oral BID  . loratadine  10 mg Oral Daily  . LORazepam  0.5 mg Oral Daily  . metoprolol succinate  12.5 mg Oral Daily  . mupirocin ointment  1 application Nasal BID    Review of Systems: GENERAL:  Patient denies any complaints.  No fevers, sweats.  Unspecified amount of weight loss. PERFORMANCE STATUS (ECOG):  2 HEENT:  No visual changes, runny nose, sore throat, mouth sores or tenderness. Lungs: No shortness of breath or cough.  No hemoptysis. Cardiac:  No chest pain, palpitations, orthopnea, or PND. GI:  No nausea, vomiting, diarrhea, constipation, melena or hematochezia. GU:  Denies any urinary symptoms.  No urgency, frequency, dysuria, or hematuria. Musculoskeletal:  No back pain.  No joint pain.  No muscle tenderness. Extremities:  No pain or swelling. Skin:  No rashes or skin changes. Neuro:  No headache, numbness or weakness, balance or coordination issues. Endocrine:  No diabetes, thyroid issues, hot flashes or night sweats. Psych:  No mood changes, depression or anxiety. Pain:  No focal pain. Review of systems:  All other systems reviewed and found to be negative.  Physical  Exam: Blood pressure (!) 138/98, pulse 97, temperature 99.1 F (37.3 C), temperature source Oral, resp. rate 18, height _0  (1.778 m), weight 153 lb 4.8 oz (69.5 kg), SpO2 90 %.  GENERAL:  Chronically ill appearing thin gentleman sitting up ready to eat lunch in no acute distress. MENTAL STATUS:  Alert and oriented to person and place. HEAD:  Short black hair.  Scruffy gray facial hair.    Temporal wasting.  Normocephalic, atraumatic, face symmetric, no Cushingoid features. EYES:  Glasses.  Brown eyes.  Pupils equal round and reactive to light and accomodation.  No conjunctivitis or scleral icterus. ENT:  Oropharynx clear without lesion.  Tongue normal. Mucous membranes moist.  RESPIRATORY:  Clear to auscultation without rales, wheezes or rhonchi. CARDIOVASCULAR:  Regular rate and rhythm without murmur, rub or gallop. ABDOMEN:  Suprapubic mass extend to 2 finger breaths below umbilicus.  Soft, with active bowel sounds and no hepatosplenomegaly. SKIN:  No rashes, ulcers or lesions. EXTREMITIES: Thin legs.  No edema, no skin discoloration or tenderness.  No palpable cords. NEUROLOGICAL: Unremarkable. PSYCH:  Appropriate.   Results for orders placed or performed during the hospital encounter of 11/10/18 (from the past 48 hour(s))  Urinalysis, Complete w Microscopic     Status: Abnormal   Collection Time: 11/11/18  6:50 PM  Result Value Ref Range   Color, Urine YELLOW (A) YELLOW   APPearance TURBID (A) CLEAR   Specific Gravity, Urine 1.011 1.005 - 1.030   pH 6.0  5.0 - 8.0   Glucose, UA NEGATIVE NEGATIVE mg/dL   Hgb urine dipstick MODERATE (A) NEGATIVE   Bilirubin Urine NEGATIVE NEGATIVE   Ketones, ur NEGATIVE NEGATIVE mg/dL   Protein, ur 100 (A) NEGATIVE mg/dL   Nitrite NEGATIVE NEGATIVE   Leukocytes,Ua MODERATE (A) NEGATIVE   RBC / HPF 21-50 0 - 5 RBC/hpf   WBC, UA >50 (H) 0 - 5 WBC/hpf   Bacteria, UA NONE SEEN NONE SEEN   Squamous Epithelial / LPF 21-50 0 - 5   WBC Clumps PRESENT      Comment: Performed at [facility], [address] [place], [place] [address]  Protein / creatinine ratio, urine     Status: Abnormal   Collection Time: 11/11/18  6:50 PM  Result Value Ref Range   Creatinine, Urine 53 mg/dL   Total Protein, Urine 192 mg/dL    Comment: RESULT CONFIRMED BY MANUAL DILUTION MJU NO NORMAL RANGE ESTABLISHED FOR THIS TEST    Protein Creatinine Ratio 3.62 (H) 0.00 - 0.15 mg/mg[Cre]    Comment: Performed at [facility], [address] [place], [place] [address]  CBC     Status: Abnormal   Collection Time: 11/12/18  6:36 AM  Result Value Ref Range   WBC 12.4 (H) 4.0 - 10.5 K/uL   RBC 3.37 (L) 4.22 - 5.81 MIL/uL   Hemoglobin 9.8 (L) 13.0 - 17.0 g/dL   HCT 32.1 (L) 39.0 - 52.0 %   MCV 95.3 80.0 - 100.0 fL   MCH 29.1 26.0 - 34.0 pg   MCHC 30.5 30.0 - 36.0 g/dL   RDW 14.8 11.5 - 15.5 %   Platelets 468 (H) 150 - 400 K/uL   nRBC 0.0 0.0 - 0.2 %    Comment: Performed at [facility], [address] [place], [place] [address]  Basic metabolic panel     Status: Abnormal   Collection Time: 11/12/18  6:36 AM  Result Value Ref Range   Sodium 142 135 - 145 mmol/L   Potassium 4.5 3.5 - 5.1 mmol/L   Chloride 116 (H) 98 - 111 mmol/L   CO2 19 (L) 22 - 32 mmol/L   Glucose, Bld 72 70 - 99 mg/dL   BUN 48 (H) 8 - 23 mg/dL   Creatinine, Ser 2.45 (H) 0.61 - 1.24 mg/dL   Calcium 13.2 (HH) 8.9 - 10.3 mg/dL    Comment: CRITICAL RESULT CALLED TO, READ BACK BY AND VERIFIED WITH STEPHANIE SUMMERS AT 3428 11/12/2018.PMF   GFR calc non Af Amer 27 (L) >60 mL/min   GFR calc Af Amer 32 (L) >60 mL/min   Anion gap 7 5 - 15    Comment: Performed at [facility], [address] [place],  [address]  ANA w/Reflex if Positive     Status: None   Collection Time: 11/12/18  6:36 AM  Result Value Ref Range   Anti Nuclear Antibody (ANA) Negative Negative    Comment: (NOTE) Performed At: [facility] [place],  [place] [id number] Rush Farmer MD HR:[id number]   PSA     Status: None   Collection Time: 11/12/18  6:36 AM  Result Value Ref Range   Prostatic Specific Antigen 0.02 0.00 - 4.00 ng/mL    Comment: (NOTE) While PSA levels of <=4.0 ng/ml are reported as reference range, some men with levels below 4.0 ng/ml can have prostate cancer and many men with PSA above 4.0 ng/ml do not have prostate cancer.  Other tests such as free PSA, age specific reference ranges, PSA velocity and PSA doubling time may be helpful especially in men less than 71 years old. Performed at [facility], [place] [address] [place], [place] [address]   Ferritin     Status: Abnormal   Collection Time: 11/12/18  6:36 AM  Result Value Ref Range   Ferritin 959 (H) 24 - 336 ng/mL    Comment: Performed at [facility], [address] [place], [place] [address]  Iron and TIBC     Status: Abnormal   Collection Time: 11/12/18  6:36 AM  Result Value Ref Range   Iron 16 (L) 45 - 182 ug/dL   TIBC 161 (L) 250 - 450 ug/dL   Saturation Ratios 10 (L) 17.9 - 39.5 %   UIBC 145 ug/dL    Comment: Performed at [facility], [address] [place], [place] [address]  Vitamin B12     Status: None   Collection Time: 11/12/18  6:36 AM  Result Value Ref Range   Vitamin B-12 382 180 - 914 pg/mL    Comment: (NOTE) This assay is not validated for testing neonatal or myeloproliferative syndrome specimens for Vitamin B12 levels. Performed at [facility], [place] [address] [place], [place] [address]   Folate     Status: Abnormal   Collection Time: 11/12/18  6:36 AM  Result Value Ref Range   Folate 4.7 (L) >5.9 ng/mL    Comment: Performed at [facility], [address] [place], [place] [address]  Reticulocytes     Status: Abnormal   Collection Time: 11/12/18  6:36 AM  Result Value Ref Range   Retic Ct Pct 1.3 0.4 - 3.1 %   RBC. 3.37 (L) 4.22 - 5.81 MIL/uL   Retic Count, Absolute 44.1 19.0 -  186.0 K/uL   Immature Retic Fract 7.9 2.3 - 15.9 %    Comment: Performed at [facility], [address] [place], [place] [address]  CBC     Status: Abnormal   Collection Time: 11/13/18  5:34 AM  Result Value Ref Range   WBC 14.5 (H) 4.0 - 10.5 K/uL   RBC 3.36 (L) 4.22 - 5.81 MIL/uL   Hemoglobin 9.6 (L) 13.0 - 17.0 g/dL   HCT 31.3 (L) 39.0 - 52.0 %   MCV 93.2 80.0 - 100.0 fL   MCH 28.6 26.0 - 34.0 pg   MCHC 30.7 30.0 - 36.0 g/dL   RDW 14.8 11.5 - 15.5 %   Platelets 428 (H) 150 - 400 K/uL   nRBC 0.0 0.0 - 0.2 %    Comment: Performed at [facility], [address] [place], [place] [address]  Basic metabolic panel     Status: Abnormal   Collection Time: 11/13/18  5:34 AM  Result Value Ref Range   Sodium 141 135 - 145 mmol/L   Potassium 4.2 3.5 - 5.1 mmol/L   Chloride 117 (H) 98 - 111 mmol/L   CO2 20 (L) 22 - 32 mmol/L   Glucose, Bld 93 70 - 99 mg/dL   BUN 45 (H) 8 - 23 mg/dL   Creatinine, Ser 2.28 (H) 0.61 - 1.24 mg/dL   Calcium 12.4 (H) 8.9 - 10.3 mg/dL   GFR calc non Af Amer 30 (L) >60 mL/min   GFR calc Af Amer 34 (L) >60 mL/min   Anion gap 4 (L) 5 - 15    Comment: Performed at [facility], [place], [place] [address]   Ct Abdomen Pelvis Wo Contrast  Result Date: 11/12/2018 CLINICAL DATA:  Abnormal renal bladder ultrasound prior day EXAM: CT ABDOMEN AND PELVIS WITHOUT CONTRAST TECHNIQUE: Multidetector CT imaging of the abdomen and pelvis was performed following the standard protocol without IV contrast. Oral contrast was received. COMPARISON:  Ultrasound November 11, 2018 FINDINGS: Lower chest: The visualized heart size within normal limits. No pericardial fluid/thickening. No hiatal hernia. The visualized portions of the lungs are clear. Hepatobiliary: There are multiple low-density lesions seen throughout the liver parenchyma the largest measuring 6.2 cm within the caudate lobe no evidence of calcified gallstones, gallbladder wall  thickening or biliary dilatation. Pancreas: Unremarkable. No pancreatic ductal dilatation or surrounding inflammatory changes. Spleen: Normal in size without focal abnormality. Adrenals/Urinary Tract: There is mild thickening of the bilateral adrenal glands without a focal abnormality. There is marked severe bilateral pelvicaliectasis and ureterectasis down to the level of the UVJ. There is a heterogeneous hyperdense large lobular bladder wall mass seen superiorly which protrudes and displaces several loops of small bowel. There is also a downward mass effect seen upon the prostate gland. The bladder is grossly distended with fluid. Within the upper pole of the right kidney there is a 1.8 cm low-density lesion. There is a 6 mm calculus seen in the upper pole the left kidney. Stomach/Bowel: The stomach, small bowel, and colon are normal in appearance. A moderate amount of colonic stool is present. No definite areas of bowel wall thickening. Vascular/Lymphatic: No definite mesenteric or pelvic sidewall adenopathy seen. Scattered aortic and iliac atherosclerotic calcifications are seen without aneurysmal dilatation. Reproductive: Downward displacement due to the large bladder wall mass. Other: No evidence of abdominal wall mass or hernia. Musculoskel0etal: No acute or significant osseous findings. There is diffuse osteopenia. Fixation hardware seen in the left femur. IMPRESSION: 1. Large lobular soft tissue superior bladder wall mass causing marked bilateral hydronephrosis and mass effect upon small-bowel and prostate gland. 2. Multiple low-density lesions throughout the liver parenchyma the largest measuring 6 cm in the caudate lobe,unable to further evaluate due to the lack of intravenous contrast. 3. 6 mm renal calculus in the left upper pole. Electronically Signed   By: Prudencio Pair M.D.   On: 11/12/2018 12:49   US Renal  Result Date: 11/11/2018 CLINICAL DATA:  63 year old male with acute renal failure. EXAM:  RENAL / URINARY TRACT ULTRASOUND COMPLETE COMPARISON:  None. FINDINGS: Right Kidney: Renal measurements: 11.6 x 5.9 x 6.8 centimeters = volume: 242 mL. Moderate to severe right hydronephrosis (image 2). Superimposed echogenic layering debris suspected within the dilated collecting system (see power Doppler image 11). Grossly normal renal cortical echogenicity. No right renal mass identified. Left Kidney: Renal measurements: 11.3 x 5.6 x 4.7 centimeters = volume: 155 mL. Moderate to severe hydronephrosis with similar echogenic nonvascular debris throughout the collecting system (image 66). Suggestion of a superimposed shadowing calculus up to 10 millimeters (image 75). Grossly normal left cortical echogenicity. Bladder: Highly abnormal appearance of the bladder with what on grayscale imaging seems to be fungating mass, but with the application of Doppler (image 94) seems to be nonvascular tumefactive debris. The technologist reports the patient was unable to void. IMPRESSION: 1. Severely abnormal ultrasound appearance of the bladder and both kidneys. 2. Severe Bilateral Hydronephrosis with what appears to be bulky layering and adherent Urinary Debris throughout the both dilated collecting systems and the bladder. 3. Underlying bladder tumor is difficult to exclude. Recommend correlation with urinalysis  and consider Urology consultation. Electronically Signed   By: Genevie Ann M.D.   On: 11/11/2018 17:13    Assessment:  Shakim Faith is a 63 y.o. male with probable stage IV bladder cancer admitted with weakness, increased creatinine and hypercalcemia.   Abdomen and pelvis CT on 11/12/2018 revealed a large lobular soft tissue superior bladder wall mass causing marked bilateral hydronephrosis and mass effect upon small-bowel and prostate gland.  There were multiple low-density lesions throughout the liver parenchyma the largest measuring 6 cm in the caudate lobe  He is s/p left femoral neck fracture s/p fall on  10/18/2018.  No pathology available.  He denies any bone pain.  He denies any recurrent infections.  He has acute on chronic renal insufficiency.  Baseline creatinine 1.87.  Current creatinine  2.28 (Cr Cl 33 ml/min).  Symptomatically, he denies any complaint.  Exam reveals temporal wasting and a suprapubic mass extending to 2 FB below the unmbilicus.  Plan:   1.   Bladder mass with liver lesions             Reviewed CT scan with patient and likely diagnosis of metastatic bladder cancer.  Discuss additional testing (chest CT without contrast) and bone scan.   Patient in agreement.  Discuss plan for cystoscopy and biopsy with urology next week.   Patient in agreement.  Discuss thoughts about chemotherapy.   Given patient's performance status, concern raised for aggressive multi-agent chemotherapy.    Discuss gemcitabine and carboplatin.    Potential side effects reviewed.   Patient adamant that he does NOT want chemotherapy.   Patient would consider immunotherapy.  Side effects reviewed.   Discuss sending bladder biopsy material for [employer] testing (PDL-1 etc).  Attempted to contact patient's legal guardian as well secondary number provided.   Messages left.  He is not available over the weekend.   Per chart, he is interested in the patient receiving treatment/chemotherapy. 2.  Hypercalcemia             Calcium slowly improving with hydration.  Calcium 14.2 to 13.1 to 12.4.             Suspect elevated PTH-rp from underlying malignancy.  Follow-up multiple labs pending. 3.  Renal insufficiency             Appreciate nephrology and urology consults.             Patient confirms admission to [facility] from [facility]/2018 - 02/06/2018.   Acute renal failure superimposed on CKD stage III   Renal ultrasound with new bilateral hydronephrosis.   Irregular thickening of the anterior bladder with appearance worrisome for malignancy.  Discuss current concern for large  bladder mass and likely future obstruction.   Patient stated that he would consider stent placement (external) if needed.   Concern for long term patency of internal stents. 4.  Cardiology  During patient's admission to [facility] from 02/02/2018 - 02/06/2018.   CHF with EF 25% and dilated left atrium.   Atrial fibrillation with RVR.   Patient not felt a good candidate for anticoagulation secondary to noncompliance and h/o gross hematuria. 5.   Normocytic anemia             Notes from 02/2018 admission reveal a history of gross hematuria.             Retic 1.3%.  Ferritin 959 with iron saturation 10% with a TIBC 161.  Folate 4.7 (low).  Begin folate 1 mg po q day.  B12 382 (low normal).  Suspect some component of anemia due to chronic disease. 6.   Code status  Plan to discuss with patient's guardian.   [name], MD  11/13/2018, 4:23 PM

## 2018-11-14 LAB — CBC
HCT: 31.2 % — ABNORMAL LOW (ref 39.0–52.0)
Hemoglobin: 9.6 g/dL — ABNORMAL LOW (ref 13.0–17.0)
MCH: 28.9 pg (ref 26.0–34.0)
MCHC: 30.8 g/dL (ref 30.0–36.0)
MCV: 94 fL (ref 80.0–100.0)
Platelets: 413 10*3/uL — ABNORMAL HIGH (ref 150–400)
RBC: 3.32 MIL/uL — ABNORMAL LOW (ref 4.22–5.81)
RDW: 15.3 % (ref 11.5–15.5)
WBC: 24.4 10*3/uL — ABNORMAL HIGH (ref 4.0–10.5)
nRBC: 0 % (ref 0.0–0.2)

## 2018-11-14 LAB — BASIC METABOLIC PANEL
Anion gap: 7 (ref 5–15)
BUN: 51 mg/dL — ABNORMAL HIGH (ref 8–23)
CO2: 15 mmol/L — ABNORMAL LOW (ref 22–32)
Calcium: 12.1 mg/dL — ABNORMAL HIGH (ref 8.9–10.3)
Chloride: 119 mmol/L — ABNORMAL HIGH (ref 98–111)
Creatinine, Ser: 3.06 mg/dL — ABNORMAL HIGH (ref 0.61–1.24)
GFR calc Af Amer: 24 mL/min — ABNORMAL LOW (ref >60)
GFR calc non Af Amer: 21 mL/min — ABNORMAL LOW (ref >60)
Glucose, Bld: 76 mg/dL (ref 70–99)
Potassium: 4.1 mmol/L (ref 3.5–5.1)
Sodium: 141 mmol/L (ref 135–145)

## 2018-11-14 LAB — LACTIC ACID, PLASMA: Lactic Acid, Venous: 1.8 mmol/L (ref 0.5–1.9)

## 2018-11-14 MED ORDER — STERILE WATER FOR INJECTION IV SOLN
INTRAVENOUS | Status: DC
  Administered 2018-11-14 – 2018-11-23 (×15): via INTRAVENOUS
  Filled 2018-11-14 (×25): qty 850

## 2018-11-14 MED ORDER — FOLIC ACID 1 MG PO TABS
1.0000 mg | ORAL_TABLET | Freq: Every day | ORAL | Status: DC
  Administered 2018-11-15 – 2018-11-23 (×8): 1 mg via ORAL
  Filled 2018-11-14 (×8): qty 1

## 2018-11-14 MED ORDER — VANCOMYCIN HCL 10 G IV SOLR
1500.0000 mg | Freq: Once | INTRAVENOUS | Status: DC
  Filled 2018-11-14: qty 1500

## 2018-11-14 MED ORDER — SODIUM CHLORIDE 0.9 % IV SOLN
2.0000 g | INTRAVENOUS | Status: DC
  Administered 2018-11-14: 2 g via INTRAVENOUS
  Filled 2018-11-14 (×3): qty 2

## 2018-11-14 MED ORDER — VANCOMYCIN HCL 1.25 G IV SOLR: 1250.0000 mg | INTRAVENOUS | Status: DC

## 2018-11-14 MED ORDER — VANCOMYCIN HCL 1.5 G IV SOLR
1500.0000 mg | Freq: Once | INTRAVENOUS | Status: AC
  Administered 2018-11-14: 1500 mg via INTRAVENOUS
  Filled 2018-11-14: qty 1500

## 2018-11-14 NOTE — Progress Notes (Signed)
[facility] Hematology/Oncology Progress Note  Date of admission: 11/10/2018  Hospital day:  11/14/2018  Chief Complaint: Ruben Clark is a 63 y.o. male with stage III chronic kidney disease, CHF, and atrial fibrillation on Eliquis who was admitted through the emergency room with weakness and a calcium of 14.2.  Subjective:  Patient denies any pain.  He states that he doesn't like the food (roast).  Social History: The patient is alone today.  His legal guardian is [name] [phone]).  Patient states that he has known him for years.  Allergies: No Known Allergies  Scheduled Medications: . aspirin EC  81 mg Oral Daily  . Chlorhexidine Gluconate Cloth  6 each Topical Q0600  . digoxin  0.25 mg Oral Daily  . docusate sodium  100 mg Oral BID  . finasteride  5 mg Oral Daily  . gabapentin  300 mg Oral BID  . loratadine  10 mg Oral Daily  . LORazepam  0.5 mg Oral Daily  . metoprolol succinate  12.5 mg Oral Daily  . mupirocin ointment  1 application Nasal BID    Review of Systems: GENERAL:  Patient denies any complaint.  No fevers, sweats.  Weight loss. PERFORMANCE STATUS (ECOG):  2 HEENT:  No visual changes, runny nose, sore throat, mouth sores or tenderness. Lungs: No shortness of breath or cough.  No hemoptysis. Cardiac:  No chest pain, palpitations, orthopnea, or PND. GI:  Poor appetite per nursing.  No nausea, vomiting, diarrhea, constipation, melena or hematochezia. GU:   Adamantly denies any urinary symptoms.  No urgency, frequency, dysuria, or hematuria. Musculoskeletal:  No back pain.  No joint pain.  No muscle tenderness. Extremities:  No pain or swelling. Skin:  No rashes or skin changes. Neuro:  No headache, numbness or weakness, balance or coordination issues. Endocrine:  No diabetes, thyroid issues, hot flashes or night sweats. Psych:  No mood changes, depression or anxiety.  Wants to go home. Pain:  No focal pain. Review of systems:  All other  systems reviewed and found to be negative.   Physical Exam: Blood pressure 119/76, pulse 91, temperature 98.4 F (36.9 C), temperature source Oral, resp. rate 16, height 5' 10"  (1.778 m), weight 149 lb 9.6 oz (67.9 kg), SpO2 100 %.  GENERAL:  Chronically ill appearing thin gentleman sitting comfortably up in bed on the medical unit in no acute distress.  He has a tray of food but does not want it. MENTAL STATUS:  Alert and oriented to person and place. HEAD:  Short black hair.  Scruffy graying facial hair.  Temporal wasting.  Normocephalic, atraumatic, face symmetric, no Cushingoid features. EYES:  Glasses.  Brown eyes.  Pupils equal round and reactive to light and accomodation.  No conjunctivitis or scleral icterus. ENT:  Oropharynx clear without lesion.  Tongue normal. Mucous membranes dry.  RESPIRATORY:  Clear to auscultation without rales, wheezes or rhonchi. CARDIOVASCULAR:  Regular rate and rhythm without murmur, rub or gallop. ABDOMEN:  Soft, tender large suprapubic mass extending 2 FB below umbilicus. Active bowel sounds and no hepatosplenomegaly. SKIN:  No rashes, ulcers or lesions. EXTREMITIES: Thin legs.  No edema, no skin discoloration or tenderness.  No palpable cords. NEUROLOGICAL: Unremarkable. PSYCH:  Appropriate.    Results for orders placed or performed during the hospital encounter of 11/10/18 (from the past 48 hour(s))  CBC     Status: Abnormal   Collection Time: 11/13/18  5:34 AM  Result Value Ref Range   WBC 14.5 (  H) 4.0 - 10.5 K/uL   RBC 3.36 (L) 4.22 - 5.81 MIL/uL   Hemoglobin 9.6 (L) 13.0 - 17.0 g/dL   HCT 31.3 (L) 39.0 - 52.0 %   MCV 93.2 80.0 - 100.0 fL   MCH 28.6 26.0 - 34.0 pg   MCHC 30.7 30.0 - 36.0 g/dL   RDW 14.8 11.5 - 15.5 %   Platelets 428 (H) 150 - 400 K/uL   nRBC 0.0 0.0 - 0.2 %    Comment: Performed at [facility], [address] [place], [place] [address]  Basic metabolic panel     Status: Abnormal   Collection Time: 11/13/18  5:34  AM  Result Value Ref Range   Sodium 141 135 - 145 mmol/L   Potassium 4.2 3.5 - 5.1 mmol/L   Chloride 117 (H) 98 - 111 mmol/L   CO2 20 (L) 22 - 32 mmol/L   Glucose, Bld 93 70 - 99 mg/dL   BUN 45 (H) 8 - 23 mg/dL   Creatinine, Ser 2.28 (H) 0.61 - 1.24 mg/dL   Calcium 12.4 (H) 8.9 - 10.3 mg/dL   GFR calc non Af Amer 30 (L) >60 mL/min   GFR calc Af Amer 34 (L) >60 mL/min   Anion gap 4 (L) 5 - 15    Comment: Performed at [facility], [address] [place], [place] [address]  CBC     Status: Abnormal   Collection Time: 11/14/18  5:40 AM  Result Value Ref Range   WBC 24.4 (H) 4.0 - 10.5 K/uL   RBC 3.32 (L) 4.22 - 5.81 MIL/uL   Hemoglobin 9.6 (L) 13.0 - 17.0 g/dL   HCT 31.2 (L) 39.0 - 52.0 %   MCV 94.0 80.0 - 100.0 fL   MCH 28.9 26.0 - 34.0 pg   MCHC 30.8 30.0 - 36.0 g/dL   RDW 15.3 11.5 - 15.5 %   Platelets 413 (H) 150 - 400 K/uL   nRBC 0.0 0.0 - 0.2 %    Comment: Performed at [facility], [address] [place], [place] [address]  Basic metabolic panel     Status: Abnormal   Collection Time: 11/14/18  5:40 AM  Result Value Ref Range   Sodium 141 135 - 145 mmol/L   Potassium 4.1 3.5 - 5.1 mmol/L   Chloride 119 (H) 98 - 111 mmol/L   CO2 15 (L) 22 - 32 mmol/L   Glucose, Bld 76 70 - 99 mg/dL   BUN 51 (H) 8 - 23 mg/dL   Creatinine, Ser 3.06 (H) 0.61 - 1.24 mg/dL   Calcium 12.1 (H) 8.9 - 10.3 mg/dL   GFR calc non Af Amer 21 (L) >60 mL/min   GFR calc Af Amer 24 (L) >60 mL/min   Anion gap 7 5 - 15    Comment: Performed at [facility], [address] [place], [place] [address]  Lactic acid, plasma     Status: None   Collection Time: 11/14/18 10:53 AM  Result Value Ref Range   Lactic Acid, Venous 1.8 0.5 - 1.9 mmol/L    Comment: Performed at [facility], [address] [place], [place] [address]   No results found.  Assessment:  Ruben Clark is a 63 y.o. male with probable stage IV bladder cancer admitted with weakness, increased  creatinine and hypercalcemia.   Abdomen and pelvis CT on 11/12/2018 revealed a large lobular soft tissue superior bladder wall mass causing marked bilateral hydronephrosis and mass  effect upon small-bowel and prostate gland.  There were multiple low-density lesions throughout the liver parenchyma the largest measuring 6 cm in the caudate lobe  He is s/p left femoral neck fracture s/p fall on 10/18/2018.  No pathology available.  He denies any bone pain.  He denies any recurrent infections.  He has acute on chronic renal insufficiency.  Baseline creatinine 1.87.  Current creatinine  2.28 (Cr Cl 33 ml/min).  Symptomatically, he continues to deny any complaint.  Exam reveals temporal wasting and a suprapubic mass extending to 2 FB below the unmbilicus.  Plan:   1.   Bladder mass with liver lesions             Review likely diagnosis of metastatic bladder cancer.  Patient agreeable to additional testing (chest CT without contrast) and bone scan.  Patient agreeable to cystoscopy and biopsy this week.  Patient remains adamant about NO chemotherapy.  Given patient's performance status, concern raised for aggressive multi-agent chemotherapy.   Previously discussed gemcitabine and carboplatin.   Patient is considering immunotherapy.  Side effects re-reviewed.   Send bladder biopsy material for [employer] testing (PDL-1 etc).  Patient's legal guardian not available until tomorrow.   Will contact [name] [phone]) tomorrow morning.   Per chart, he is interested in the patient receiving treatment/chemotherapy. 2.  Hypercalcemia             Calcium slowly improving with hydration.  Calcium 14.2 to 13.1 to 12.4 to 12.1.             Suspect elevated PTH-rp from underlying malignancy.  Follow-up multiple labs pending. 3.  Renal insufficiency             Appreciate nephrology and urology consults.             Patient admitted to [facility] from 02/02/2018 -  02/06/2018.   Acute renal failure superimposed on CKD stage III   Renal ultrasound with new bilateral hydronephrosis.   Irregular thickening of the anterior bladder with appearance worrisome for malignancy.  Creatinine increasing 2.28 to 3.06 overnight.  Anticipate stent placement with urology secondary to large bladder mass and likely pending complete obstruction. 4.  Cardiology  During patient's admission to [facility] from 02/02/2018 - 02/06/2018.   CHF with EF 25% and dilated left atrium.   Atrial fibrillation with RVR.   Patient not felt a good candidate for anticoagulation secondary to noncompliance and h/o gross hematuria. 5.   Normocytic anemia             Hematocrit 31.2.  Hemoglobin 9.6.  MCV 94.0.  Notes from 02/2018 admission reveal a history of gross hematuria.             Retic 1.3%.  Ferritin 959 with iron saturation 10% with a TIBC 161.  Folate 4.7 (low).     Begin folate 1 mg po q day.  B12 382 (low normal).  Suspect some component of anemia due to chronic disease. 6.   Leukocytosis  WBC 14,500 to 24,400 overnight  Urine culture pending.  Patient empirically started on vancomycin and Cefepime. 7.   Code status  Plan to discuss with patient's guardian.   [name], MD  11/14/2018, 5:25 PM

## 2018-11-14 NOTE — Progress Notes (Signed)
Pharmacy Antibiotic Note  Ruben Clark is a 63 y.o. male admitted on 11/10/2018 with sepsis.  Pharmacy has been consulted for Vancomycin and Cefepime dosing.  Plan: Vancomycin 1500mg  IV loading dose followed by Vancomycin 1250 mg IV Q 48 hrs. Goal AUC 400-550. Expected AUC: 494.5 SCr used: 3.06 Expceted Cmin: 10.8  Cefepime 2g IV q24h   Height: 5\' 10"  (177.8 cm) Weight: 149 lb 9.6 oz (67.9 kg) IBW/kg (Calculated) : 73  Temp (24hrs), Avg:98 F (36.7 C), Min:97.5 F (36.4 C), Max:99.1 F (37.3 C)  Recent Labs  Lab 11/10/18 1631 11/11/18 0539 11/12/18 0636 11/13/18 0534 11/14/18 0540  WBC 13.6* 14.2* 12.4* 14.5* 24.4*  CREATININE 2.76* 2.67* 2.45* 2.28* 3.06*    Estimated Creatinine Clearance: 24 mL/min (A) (by C-G formula based on SCr of 3.06 mg/dL (H)).    No Known Allergies  Antimicrobials this admission: Vancomycin  8/9 >>  Cefepime 8/9 >>   Thank you for allowing pharmacy to be a part of this patient's care.  Paulina Fusi, PharmD, BCPS 11/14/2018 10:15 AM

## 2018-11-14 NOTE — Progress Notes (Signed)
Urology Consult Follow Up  Subjective: Sleeping this morning.  Creatinine increased 3.06.  VSS, afebrile  Anti-infectives: Anti-infectives (From admission, onward)   Start     Dose/Rate Route Frequency Ordered Stop   11/16/18 1000  vancomycin (VANCOCIN) 1,250 mg in sodium chloride 0.9 % 250 mL IVPB     1,250 mg 166.7 mL/hr over 90 Minutes Intravenous Every 48 hours 11/14/18 1010     11/14/18 1015  vancomycin (VANCOCIN) 1,500 mg in sodium chloride 0.9 % 500 mL IVPB     1,500 mg 250 mL/hr over 120 Minutes Intravenous  Once 11/14/18 1000     11/14/18 1000  ceFEPIme (MAXIPIME) 2 g in sodium chloride 0.9 % 100 mL IVPB     2 g 200 mL/hr over 30 Minutes Intravenous Every 24 hours 11/14/18 0959     11/14/18 1000  vancomycin (VANCOCIN) 1,500 mg in sodium chloride 0.9 % 500 mL IVPB  Status:  Discontinued     1,500 mg 250 mL/hr over 120 Minutes Intravenous  Once 11/14/18 0959 11/14/18 1000      Current Facility-Administered Medications  Medication Dose Route Frequency Provider Last Rate Last Dose  . acetaminophen (TYLENOL) tablet 650 mg  650 mg Oral Q6H PRN Saundra Shelling, MD   650 mg at 11/13/18 0137   Or  . acetaminophen (TYLENOL) suppository 650 mg  650 mg Rectal Q6H PRN Saundra Shelling, MD      . aspirin EC tablet 81 mg  81 mg Oral Daily Pyreddy, Pavan, MD   81 mg at 11/14/18 1004  . bisacodyl (DULCOLAX) EC tablet 5 mg  5 mg Oral Daily PRN Pyreddy, Reatha Harps, MD      . ceFEPIme (MAXIPIME) 2 g in sodium chloride 0.9 % 100 mL IVPB  2 g Intravenous Q24H Pyreddy, Pavan, MD      . Chlorhexidine Gluconate Cloth 2 % PADS 6 each  6 each Topical Q0600 Max Sane, MD   6 each at 11/14/18 0521  . digoxin (LANOXIN) tablet 0.25 mg  0.25 mg Oral Daily Pyreddy, Pavan, MD   0.25 mg at 11/14/18 1004  . docusate sodium (COLACE) capsule 100 mg  100 mg Oral BID Saundra Shelling, MD   100 mg at 11/14/18 1004  . finasteride (PROSCAR) tablet 5 mg  5 mg Oral Daily Pyreddy, Pavan, MD   5 mg at 11/14/18 1004  .  gabapentin (NEURONTIN) capsule 300 mg  300 mg Oral BID Saundra Shelling, MD   300 mg at 11/14/18 1004  . hydrOXYzine (ATARAX/VISTARIL) tablet 25 mg  25 mg Oral Q8H PRN Pyreddy, Reatha Harps, MD      . loratadine (CLARITIN) tablet 10 mg  10 mg Oral Daily Pyreddy, Pavan, MD   10 mg at 11/14/18 1004  . LORazepam (ATIVAN) tablet 0.5 mg  0.5 mg Oral Daily Pyreddy, Pavan, MD   0.5 mg at 11/14/18 1004  . metoprolol succinate (TOPROL-XL) 24 hr tablet 12.5 mg  12.5 mg Oral Daily Pyreddy, Pavan, MD   12.5 mg at 11/14/18 1004  . mupirocin ointment (BACTROBAN) 2 % 1 application  1 application Nasal BID Max Sane, MD   1 application at 93/81/01 1004  . ondansetron (ZOFRAN) tablet 4 mg  4 mg Oral Q6H PRN Pyreddy, Reatha Harps, MD       Or  . ondansetron (ZOFRAN) injection 4 mg  4 mg Intravenous Q6H PRN Pyreddy, Pavan, MD      . senna (SENOKOT) tablet 8.6 mg  1 tablet Oral Daily PRN Saundra Shelling, MD      .  sodium bicarbonate 150 mEq in sterile water 1,000 mL infusion   Intravenous Continuous Murlean Iba, MD 75 mL/hr at 11/14/18 0820    . [START ON 11/16/2018] vancomycin (VANCOCIN) 1,250 mg in sodium chloride 0.9 % 250 mL IVPB  1,250 mg Intravenous Q48H Pyreddy, Pavan, MD      . vancomycin (VANCOCIN) 1,500 mg in sodium chloride 0.9 % 500 mL IVPB  1,500 mg Intravenous Once Pyreddy, Reatha Harps, MD         Objective: Vital signs in last 24 hours: Temp:  [97.5 F (36.4 C)-99.1 F (37.3 C)] 97.5 F (36.4 C) (08/09 0814) Pulse Rate:  [85-97] 88 (08/09 0814) BP: (93-138)/(62-98) 93/62 (08/09 0814) SpO2:  [90 %-100 %] 100 % (08/09 0814) Weight:  [67.9 kg] 67.9 kg (08/09 0439)  Intake/Output from previous day: 08/08 0701 - 08/09 0700 In: -  Out: 525 [Urine:525] Intake/Output this shift: No intake/output data recorded.   Physical Exam: Foley catheter draining clear urine  Lab Results:  Recent Labs    11/13/18 0534 11/14/18 0540  WBC 14.5* 24.4*  HGB 9.6* 9.6*  HCT 31.3* 31.2*  PLT 428* 413*   BMET Recent Labs     11/13/18 0534 11/14/18 0540  NA 141 141  K 4.2 4.1  CL 117* 119*  CO2 20* 15*  GLUCOSE 93 76  BUN 45* 51*  CREATININE 2.28* 3.06*  CALCIUM 12.4* 12.1*   PT/INR No results for input(s): LABPROT, INR in the last 72 hours. ABG No results for input(s): PHART, HCO3 in the last 72 hours.  Invalid input(s): PCO2, PO2  Studies/Results: Ct Abdomen Pelvis Wo Contrast  Result Date: 11/12/2018 CLINICAL DATA:  Abnormal renal bladder ultrasound prior day EXAM: CT ABDOMEN AND PELVIS WITHOUT CONTRAST TECHNIQUE: Multidetector CT imaging of the abdomen and pelvis was performed following the standard protocol without IV contrast. Oral contrast was received. COMPARISON:  Ultrasound November 11, 2018 FINDINGS: Lower chest: The visualized heart size within normal limits. No pericardial fluid/thickening. No hiatal hernia. The visualized portions of the lungs are clear. Hepatobiliary: There are multiple low-density lesions seen throughout the liver parenchyma the largest measuring 6.2 cm within the caudate lobe no evidence of calcified gallstones, gallbladder wall thickening or biliary dilatation. Pancreas: Unremarkable. No pancreatic ductal dilatation or surrounding inflammatory changes. Spleen: Normal in size without focal abnormality. Adrenals/Urinary Tract: There is mild thickening of the bilateral adrenal glands without a focal abnormality. There is marked severe bilateral pelvicaliectasis and ureterectasis down to the level of the UVJ. There is a heterogeneous hyperdense large lobular bladder wall mass seen superiorly which protrudes and displaces several loops of small bowel. There is also a downward mass effect seen upon the prostate gland. The bladder is grossly distended with fluid. Within the upper pole of the right kidney there is a 1.8 cm low-density lesion. There is a 6 mm calculus seen in the upper pole the left kidney. Stomach/Bowel: The stomach, small bowel, and colon are normal in appearance. A  moderate amount of colonic stool is present. No definite areas of bowel wall thickening. Vascular/Lymphatic: No definite mesenteric or pelvic sidewall adenopathy seen. Scattered aortic and iliac atherosclerotic calcifications are seen without aneurysmal dilatation. Reproductive: Downward displacement due to the large bladder wall mass. Other: No evidence of abdominal wall mass or hernia. Musculoskel0etal: No acute or significant osseous findings. There is diffuse osteopenia. Fixation hardware seen in the left femur. IMPRESSION: 1. Large lobular soft tissue superior bladder wall mass causing marked bilateral hydronephrosis and mass effect upon small-bowel  and prostate gland. 2. Multiple low-density lesions throughout the liver parenchyma the largest measuring 6 cm in the caudate lobe,unable to further evaluate due to the lack of intravenous contrast. 3. 6 mm renal calculus in the left upper pole. Electronically Signed   By: Prudencio Pair M.D.   On: 11/12/2018 12:49     Assessment: Probable metastatic bladder cancer.  Plan: Cystoscopy under anesthesia; TURBT mid next week    LOS: 4 days    Ronda Fairly [facility] 11/14/2018

## 2018-11-14 NOTE — Progress Notes (Signed)
[place] [facility] [place] NAME: Markavious Micco    MR#:  [id number]  DATE OF BIRTH:  Oct 23, 1955  SUBJECTIVE:  CHIEF COMPLAINT:   Chief Complaint  Patient presents with  . Weakness  significant weight loss, Has poor appetite Generalized weakness No fever REVIEW OF SYSTEMS:  Review of Systems  Constitutional: Positive for malaise/fatigue and weight loss. Negative for diaphoresis and fever.  HENT: Negative for ear discharge, ear pain, hearing loss, nosebleeds, sore throat and tinnitus.   Eyes: Negative for blurred vision and pain.  Respiratory: Negative for cough, hemoptysis, shortness of breath and wheezing.   Cardiovascular: Negative for chest pain, palpitations, orthopnea and leg swelling.  Gastrointestinal: Negative for abdominal pain, blood in stool, constipation, diarrhea, heartburn, nausea and vomiting.  Genitourinary: Negative for dysuria, frequency and urgency.  Musculoskeletal: Negative for back pain and myalgias.  Skin: Negative for itching and rash.  Neurological: Negative for dizziness, tingling, tremors, focal weakness, seizures, weakness and headaches.  Psychiatric/Behavioral: Negative for depression. The patient is not nervous/anxious.    DRUG ALLERGIES:  No Known Allergies VITALS:  Blood pressure 93/62, pulse 88, temperature (!) 97.5 F (36.4 C), temperature source Oral, resp. rate 18, height 5\' 10"  (1.778 m), weight 67.9 kg, SpO2 100 %. PHYSICAL EXAMINATION:  Physical Exam Constitutional:      Appearance: He is cachectic. He is ill-appearing.  HENT:     Head: Normocephalic and atraumatic.  Eyes:     Conjunctiva/sclera: Conjunctivae normal.     Pupils: Pupils are equal, round, and reactive to light.  Neck:     Musculoskeletal: Normal range of motion and neck supple.     Thyroid: No thyromegaly.     Trachea: No tracheal deviation.  Cardiovascular:     Rate and Rhythm: Normal rate and regular rhythm.     Heart  sounds: Normal heart sounds.  Pulmonary:     Effort: Pulmonary effort is normal. No respiratory distress.     Breath sounds: Normal breath sounds. No wheezing.  Chest:     Chest wall: No tenderness.  Abdominal:     General: Bowel sounds are normal. There is no distension.     Palpations: Abdomen is soft.     Tenderness: There is no abdominal tenderness.  Musculoskeletal: Normal range of motion.  Skin:    General: Skin is warm and dry.     Findings: No rash.  Neurological:     Mental Status: He is alert and oriented to person, place, and time.     Cranial Nerves: No cranial nerve deficit.    LABORATORY PANEL:  Male CBC Recent Labs  Lab 11/14/18 0540  WBC 24.4*  HGB 9.6*  HCT 31.2*  PLT 413*   ------------------------------------------------------------------------------------------------------------------ Chemistries  Recent Labs  Lab 11/10/18 1840  11/14/18 0540  NA  --    < > 141  K  --    < > 4.1  CL  --    < > 119*  CO2  --    < > 15*  GLUCOSE  --    < > 76  BUN  --    < > 51*  CREATININE  --    < > 3.06*  CALCIUM  --    < > 12.1*  MG 2.4  --   --    < > = values in this interval not displayed.   RADIOLOGY:  No results found. ASSESSMENT AND  PLAN:  63 year old male patient with a known history of hip fracture, chronic congestive heart failure, CKD stage III, hypertension, chronic afib on eliquis was referred from group home.  Patient recently had hip fracture which was repaired and sent to rehab.  Day ago patient was sent from rehab group home.  He has generalized weakness.    * Acute hypercalcemia and significant unintentional weight loss Improving High probability for malignancy -Improved some with IV fluid hydration with normal saline Follow-up calcium levels -Worrisome for underlying malignancy. oncology input appreciated  *Bladder mass High suspicion for urothelial malignancy metastaic -Has bilateral hydronephrosis -Appreciate urology evaluation  -Plan for cystoscopy TURBT and pyelogram mid week -Appreciate nephrology follow-up  *Sepsis Elevated WBC count Follow-up blood cultures Follow-up lactic acid Start patient on IV vancomycin and cefepime antibiotics  * Dehydration -Continue IV fluids  -Hypo-tension secondary to dehydration IV fluids and recheck blood pressure  -History of hip fracture status post ORIF Physical therapy to continue  -Acute on chronic kidney disease stage III -creatinine at 3.06 today.  Likely postobstructive with some prerenal component -Monitor while on IV fluids Avoid nephrotoxic meds -Nephrology input appreciated  -DVT prophylaxis On anticoagulation with Eliquis  -Chronic Afib Continue eliquis for anticoagulation  -Tobacco abuse Tobacco cessation counseled for six minutes by admitting doctor Nicotine patch offered, but patient did not want it.  -Covid 19 test negative  Overall very poor prognosis. 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 Management plans discussed with the patient, nursing and they are in agreement.  CODE STATUS: Full Code  TOTAL TIME TAKING CARE OF THIS PATIENT: 37 minutes More than 50% of the time was spent in counseling/coordination of care: YES  POSSIBLE D/C IN 2-3 DAYS, DEPENDING ON CLINICAL CONDITION.   Saundra Shelling M.D on 11/14/2018 at 12:35 PM  Between 7am to 6pm - Pager - [phone]  After 6pm go to [email or web address] - Proofreader  [facility]  Office  [phone]  CC: Primary care physician; System, Pcp Not In  Note: This dictation was prepared with Dragon dictation along with smaller phrase technology. Any transcriptional errors that result from this process are unintentional.

## 2018-11-15 DIAGNOSIS — Z89412 Acquired absence of left great toe: Secondary | ICD-10-CM

## 2018-11-15 DIAGNOSIS — I110 Hypertensive heart disease with heart failure: Secondary | ICD-10-CM

## 2018-11-15 DIAGNOSIS — Z96642 Presence of left artificial hip joint: Secondary | ICD-10-CM

## 2018-11-15 DIAGNOSIS — I482 Chronic atrial fibrillation, unspecified: Secondary | ICD-10-CM

## 2018-11-15 DIAGNOSIS — N1330 Unspecified hydronephrosis: Secondary | ICD-10-CM

## 2018-11-15 DIAGNOSIS — Z720 Tobacco use: Secondary | ICD-10-CM

## 2018-11-15 DIAGNOSIS — B9562 Methicillin resistant Staphylococcus aureus infection as the cause of diseases classified elsewhere: Secondary | ICD-10-CM

## 2018-11-15 DIAGNOSIS — J449 Chronic obstructive pulmonary disease, unspecified: Secondary | ICD-10-CM

## 2018-11-15 DIAGNOSIS — Z87311 Personal history of (healed) other pathological fracture: Secondary | ICD-10-CM

## 2018-11-15 DIAGNOSIS — N329 Bladder disorder, unspecified: Secondary | ICD-10-CM

## 2018-11-15 DIAGNOSIS — R7881 Bacteremia: Secondary | ICD-10-CM

## 2018-11-15 DIAGNOSIS — Z89422 Acquired absence of other left toe(s): Secondary | ICD-10-CM

## 2018-11-15 DIAGNOSIS — N179 Acute kidney failure, unspecified: Secondary | ICD-10-CM

## 2018-11-15 DIAGNOSIS — I5042 Chronic combined systolic (congestive) and diastolic (congestive) heart failure: Secondary | ICD-10-CM

## 2018-11-15 DIAGNOSIS — Z872 Personal history of diseases of the skin and subcutaneous tissue: Secondary | ICD-10-CM

## 2018-11-15 DIAGNOSIS — E785 Hyperlipidemia, unspecified: Secondary | ICD-10-CM

## 2018-11-15 LAB — PROTEIN ELECTROPHORESIS, SERUM
A/G Ratio: 0.7 (ref 0.7–1.7)
Albumin ELP: 2.6 g/dL — ABNORMAL LOW (ref 2.9–4.4)
Alpha-1-Globulin: 0.4 g/dL (ref 0.0–0.4)
Alpha-2-Globulin: 0.9 g/dL (ref 0.4–1.0)
Beta Globulin: 0.7 g/dL (ref 0.7–1.3)
Gamma Globulin: 1.6 g/dL (ref 0.4–1.8)
Globulin, Total: 3.6 g/dL (ref 2.2–3.9)
Total Protein ELP: 6.2 g/dL (ref 6.0–8.5)

## 2018-11-15 LAB — BLOOD CULTURE ID PANEL (REFLEXED)

## 2018-11-15 LAB — ANCA TITERS
Atypical P-ANCA titer: <1:20 {titer}
C-ANCA: <1:20 {titer}
P-ANCA: <1:20 {titer}

## 2018-11-15 LAB — VITAMIN D 25 HYDROXY (VIT D DEFICIENCY, FRACTURES): Vit D, 25-Hydroxy: 18.4 ng/mL — ABNORMAL LOW (ref 30.0–100.0)

## 2018-11-15 LAB — BASIC METABOLIC PANEL
Anion gap: 7 (ref 5–15)
BUN: 49 mg/dL — ABNORMAL HIGH (ref 8–23)
CO2: 20 mmol/L — ABNORMAL LOW (ref 22–32)
Calcium: 12 mg/dL — ABNORMAL HIGH (ref 8.9–10.3)
Chloride: 115 mmol/L — ABNORMAL HIGH (ref 98–111)
Creatinine, Ser: 3 mg/dL — ABNORMAL HIGH (ref 0.61–1.24)
GFR calc Af Amer: 25 mL/min — ABNORMAL LOW (ref >60)
GFR calc non Af Amer: 21 mL/min — ABNORMAL LOW (ref >60)
Glucose, Bld: 96 mg/dL (ref 70–99)
Potassium: 3.6 mmol/L (ref 3.5–5.1)
Sodium: 142 mmol/L (ref 135–145)

## 2018-11-15 LAB — CBC
HCT: 30.3 % — ABNORMAL LOW (ref 39.0–52.0)
Hemoglobin: 9.3 g/dL — ABNORMAL LOW (ref 13.0–17.0)
MCH: 28.8 pg (ref 26.0–34.0)
MCHC: 30.7 g/dL (ref 30.0–36.0)
MCV: 93.8 fL (ref 80.0–100.0)
Platelets: 383 10*3/uL (ref 150–400)
RBC: 3.23 MIL/uL — ABNORMAL LOW (ref 4.22–5.81)
RDW: 15.5 % (ref 11.5–15.5)
WBC: 21.5 10*3/uL — ABNORMAL HIGH (ref 4.0–10.5)
nRBC: 0 % (ref 0.0–0.2)

## 2018-11-15 LAB — AFP TUMOR MARKER: AFP, Serum, Tumor Marker: 1.2 ng/mL (ref 0.0–8.3)

## 2018-11-15 LAB — KAPPA/LAMBDA LIGHT CHAINS
Kappa free light chain: 137.8 mg/L — ABNORMAL HIGH (ref 3.3–19.4)
Kappa, lambda light chain ratio: 1.34 (ref 0.26–1.65)
Lambda free light chains: 102.7 mg/L — ABNORMAL HIGH (ref 5.7–26.3)

## 2018-11-15 MED ORDER — VANCOMYCIN VARIABLE DOSE PER UNSTABLE RENAL FUNCTION (PHARMACIST DOSING): Status: DC

## 2018-11-15 NOTE — Plan of Care (Signed)
  Problem: Education: Goal: Knowledge of General Education information will improve Description Including pain rating scale, medication(s)/side effects and non-pharmacologic comfort measures Outcome: Progressing   Problem: Elimination: Goal: Will not experience complications related to urinary retention Outcome: Progressing   Problem: Safety: Goal: Ability to remain free from injury will improve Outcome: Progressing   

## 2018-11-15 NOTE — Progress Notes (Signed)
Nurse dropped the 24 hour urine cath to the lab. Will notify incoming shift. Will continue to monitor.

## 2018-11-15 NOTE — Progress Notes (Signed)
PHARMACY - PHYSICIAN COMMUNICATION CRITICAL VALUE ALERT - BLOOD CULTURE IDENTIFICATION (BCID)  Ruben Clark is an 63 y.o. male who presented to [facility] on 11/10/2018 with a chief complaint of sepsis.  Assessment: GPC IN BOTH AEROBIC AND ANAEROBIC BOTTLES (2/4 bottles)-Staphylococcus species/Staphylococcus aureus , MecA dected  Name of physician (or Provider) Contacted: Dr. Estanislado Pandy  Current antibiotics: Vancomycin and Cefepime  Changes to prescribed antibiotics recommended: Continue Vancomycin and d/c cefepime. Recommendations accepted by provider  Results for orders placed or performed during the hospital encounter of 11/10/18  Blood Culture ID Panel (Reflexed) (Collected: 11/14/2018 11:04 AM)  Result Value Ref Range   Enterococcus species NOT DETECTED NOT DETECTED   Listeria monocytogenes NOT DETECTED NOT DETECTED   Staphylococcus species DETECTED (A) NOT DETECTED   Staphylococcus aureus (BCID) DETECTED (A) NOT DETECTED   Methicillin resistance DETECTED (A) NOT DETECTED   Streptococcus species NOT DETECTED NOT DETECTED   Streptococcus agalactiae NOT DETECTED NOT DETECTED   Streptococcus pneumoniae NOT DETECTED NOT DETECTED   Streptococcus pyogenes NOT DETECTED NOT DETECTED   Acinetobacter baumannii NOT DETECTED NOT DETECTED   Enterobacteriaceae species NOT DETECTED NOT DETECTED   Enterobacter cloacae complex NOT DETECTED NOT DETECTED   Escherichia coli NOT DETECTED NOT DETECTED   Klebsiella oxytoca NOT DETECTED NOT DETECTED   Klebsiella pneumoniae NOT DETECTED NOT DETECTED   Proteus species NOT DETECTED NOT DETECTED   Serratia marcescens NOT DETECTED NOT DETECTED   Haemophilus influenzae NOT DETECTED NOT DETECTED   Neisseria meningitidis NOT DETECTED NOT DETECTED   Pseudomonas aeruginosa NOT DETECTED NOT DETECTED   Candida albicans NOT DETECTED NOT DETECTED   Candida glabrata NOT DETECTED NOT DETECTED   Candida krusei NOT DETECTED NOT DETECTED   Candida parapsilosis NOT  DETECTED NOT DETECTED   Candida tropicalis NOT DETECTED NOT DETECTED    [name] 11/15/2018  7:52 AM

## 2018-11-15 NOTE — Progress Notes (Signed)
Physical Therapy Treatment Patient Details Name: Ruben Clark MRN: [id number] DOB: October 13, 1955 Today's Date: 11/15/2018    History of Present Illness 63 y.o. male with a known history of chronic systolic congestive heart failure, CKD stage III, hypertension, chronic afib on anticoagulation with eliquis was referred from group home.  Patient mid July he had hip fracture which was repaired (ORIF) and sent to rehab, apparently he returned to group home for 1 day and was sent to [facility].  On arrival Ca+ very elevated, still high at time of PT exam but trending downward and cleared to work with PT by nursing.    PT Comments    Pt received from supine at bed and agreeable to PT session. +2 mod assistant for bed mobility with supine <>sitting to help upper trunk and L LE. +2 Mod assistant for transfer sitting on EOB to standing. Heavy cueing needed with hand and body positioning to stand. Attempted standing 2 times and able to stand in second attempt. Lack of B knee extension when weight bearing in standing. Pt only tolerate <1 minute standing and requires to sit down. Fatigue noted and limited to pt's ability to stand and proceed transfer to chair. Vital monitored with supine at BP99/62 HR 85; and sitting 97/60 with HR 92. Denies pain throughout the session.  Will trial ambulation, transfer to chair for pulmonary hygiene and exercise in later session.     Follow Up Recommendations  SNF     Equipment Recommendations  None recommended by PT    Recommendations for Other Services       Precautions / Restrictions Precautions Precautions: Fall Restrictions Weight Bearing Restrictions: No LLE Weight Bearing: Weight bearing as tolerated    Mobility  Bed Mobility Overal bed mobility: Needs Assistance Bed Mobility: Sit to Supine;Rolling;Supine to Sit Rolling: Min assist   Supine to sit: +2 for physical assistance;Mod assist;Max assist Sit to supine: Mod assist;Max assist;+2 for physical assistance    General bed mobility comments: Vc's and tactile cueing provided for hand positioning for rolling. +2 for trunk and LE with supine <> sitting EOB  Transfers Overall transfer level: Needs assistance Equipment used: Rolling walker (2 wheeled) Transfers: Sit to/from Stand Sit to Stand: Mod assist;+2 physical assistance         General transfer comment: 2x standing attempt from elevated bed and able to stand from the second trial. Vc's provided for hand positioning to push on bed and RW management. Standing <1 minute and needs to sit back down. Unable to attempt another stand due to fatigue.  Ambulation/Gait             General Gait Details: Deferred due to fatigue in sitting.   Stairs             Wheelchair Mobility    Modified Rankin (Stroke Patients Only)       Balance Overall balance assessment: History of Falls;Needs assistance Sitting-balance support: Feet supported;Single extremity supported Sitting balance-Leahy Scale: Fair Sitting balance - Comments: Able to steady sitting with one hand support.   Standing balance support: Bilateral upper extremity supported Standing balance-Leahy Scale: Poor Standing balance comment: Requires stand with BUE hand support on RW. [place]ask of B knee extension.                            Cognition Arousal/Alertness: Awake/alert Behavior During Therapy: WFL for tasks assessed/performed Overall Cognitive Status: Within Functional Limits for tasks assessed  Exercises      General Comments        Pertinent Vitals/Pain Pain Assessment: No/denies pain Pain Intervention(s): Monitored during session    Home Living                      Prior Function            PT Goals (current goals can now be found in the care plan section) Acute Rehab PT Goals Patient Stated Goal: be able to walk PT Goal Formulation: With patient Time For Goal Achievement:  11/27/18 Potential to Achieve Goals: Fair Progress towards PT goals: Progressing toward goals(fatigue due to pain)    Frequency    Min 2X/week      PT Plan Current plan remains appropriate    Co-evaluation              AM-PAC PT "6 Clicks" Mobility   Outcome Measure  Help needed turning from your back to your side while in a flat bed without using bedrails?: A Lot Help needed moving from lying on your back to sitting on the side of a flat bed without using bedrails?: A Lot Help needed moving to and from a bed to a chair (including a wheelchair)?: Total Help needed standing up from a chair using your arms (e.g., wheelchair or bedside chair)?: A Lot Help needed to walk in hospital room?: Total Help needed climbing 3-5 steps with a railing? : Total 6 Click Score: 9    End of Session Equipment Utilized During Treatment: Gait belt Activity Tolerance: Patient limited by fatigue Patient left: in bed;with call bell/phone within reach;with bed alarm set Nurse Communication: Mobility status;Weight bearing status;Precautions PT Visit Diagnosis: Unsteadiness on feet (R26.81);Muscle weakness (generalized) (M62.81);Difficulty in walking, not elsewhere classified (R26.2);Pain;History of falling (Z91.81) Pain - part of body: Hip     Time: 6384-5364 PT Time Calculation (min) (ACUTE ONLY): 47 min  Charges:                          Sherrilyn Rist, SPT 11/15/2018, 10:45 AM

## 2018-11-15 NOTE — Care Management Important Message (Signed)
Important Message  Patient Details  Name: Ruben Clark MRN: [id number] Date of Birth: July 24, 1955   Medicare Important Message Given:  Yes     Cecil Cobbs 11/15/2018, 6:34 PM

## 2018-11-15 NOTE — Progress Notes (Signed)
[place], [place] 11/15/18  Subjective:  Patient seen at bedside. Physical therapy working with the patient today. Appears quite weak at the moment.   Objective:  Vital signs in last 24 hours:  Temp:  [97.8 F (36.6 C)-100.6 F (38.1 C)] 98.2 F (36.8 C) (08/10 0825) Pulse Rate:  [79-91] 79 (08/10 0825) Resp:  [16-20] 18 (08/10 0825) BP: (91-135)/(58-92) 101/76 (08/10 0825) SpO2:  [98 %-100 %] 100 % (08/10 0825) Weight:  [73.9 kg] 73.9 kg (08/10 0405)  Weight change: 6.078 kg Filed Weights   11/13/18 0100 11/14/18 0439 11/15/18 0405  Weight: 69.5 kg 67.9 kg 73.9 kg    Intake/Output:    Intake/Output Summary (Last 24 hours) at 11/15/2018 1155 Last data filed at 11/15/2018 0959 Gross per 24 hour  Intake 764.87 ml  Output 1330 ml  Net -565.13 ml     Physical Exam: General:  Thin, cachectic gentleman, laying in the bed  HEENT  anicteric, moist oral mucous membranes  Neck  supple  Pulm/lungs  clear to auscultation, normal effort  CVS/Heart  no rub, S1-S2  Abdomen:   Fullness, firmness over lower abdomen  Extremities:  No edema  Neurologic:  Alert, able to answer few simple questions  Skin:  Dry skin  Foley in place  Basic Metabolic Panel:  Recent Labs  Lab 11/10/18 1840 11/11/18 0539 11/11/18 1500 11/12/18 0636 11/13/18 0534 11/14/18 0540 11/15/18 0455  NA  --  139  --  142 141 141 142  K  --  4.6  --  4.5 4.2 4.1 3.6  CL  --  109  --  116* 117* 119* 115*  CO2  --  21*  --  19* 20* 15* 20*  GLUCOSE  --  93  --  72 93 76 96  BUN  --  54*  --  48* 45* 51* 49*  CREATININE  --  2.67*  --  2.45* 2.28* 3.06* 3.00*  CALCIUM  --  13.1*  --  13.2* 12.4* 12.1* 12.0*  MG 2.4  --   --   --   --   --   --   PHOS  --   --  3.5  --   --   --   --      CBC: Recent Labs  Lab 11/10/18 1631 11/11/18 0539 11/12/18 0636 11/13/18 0534 11/14/18 0540 11/15/18 0455  WBC 13.6* 14.2* 12.4* 14.5* 24.4* 21.5*  NEUTROABS 11.0*  --   --    --   --   --   HGB 10.4* 9.6* 9.8* 9.6* 9.6* 9.3*  HCT 32.8* 30.6* 32.1* 31.3* 31.2* 30.3*  MCV 91.6 93.3 95.3 93.2 94.0 93.8  PLT 519* 437* 468* 428* 413* 383      Lab Results  Component Value Date   HEPBSAG Negative 11/11/2018   HEPBSAB Non Reactive 11/11/2018      Microbiology:  Recent Results (from the past 240 hour(s))  SARS Coronavirus 2 [facility] order, Performed in [facility] hospital lab) Nasopharyngeal Nasopharyngeal Swab     Status: None   Collection Time: 11/10/18  6:40 PM   Specimen: Nasopharyngeal Swab  Result Value Ref Range Status   SARS Coronavirus 2 NEGATIVE NEGATIVE Final    Comment: (NOTE) If result is NEGATIVE SARS-CoV-2 target nucleic acids are NOT DETECTED. The SARS-CoV-2 RNA is generally detectable in upper and lower  respiratory specimens during the acute phase of infection. The lowest  concentration of SARS-CoV-2 viral copies this assay  can detect is 250  copies / mL. A negative result does not preclude SARS-CoV-2 infection  and should not be used as the sole basis for treatment or other  patient management decisions.  A negative result may occur with  improper specimen collection / handling, submission of specimen other  than nasopharyngeal swab, presence of viral mutation(s) within the  areas targeted by this assay, and inadequate number of viral copies  (<250 copies / mL). A negative result must be combined with clinical  observations, patient history, and epidemiological information. If result is POSITIVE SARS-CoV-2 target nucleic acids are DETECTED. The SARS-CoV-2 RNA is generally detectable in upper and lower  respiratory specimens dur ing the acute phase of infection.  Positive  results are indicative of active infection with SARS-CoV-2.  Clinical  correlation with patient history and other diagnostic information is  necessary to determine patient infection status.  Positive results do  not rule out bacterial infection or co-infection  with other viruses. If result is PRESUMPTIVE POSTIVE SARS-CoV-2 nucleic acids MAY BE PRESENT.   A presumptive positive result was obtained on the submitted specimen  and confirmed on repeat testing.  While 2019 novel coronavirus  (SARS-CoV-2) nucleic acids may be present in the submitted sample  additional confirmatory testing may be necessary for epidemiological  and / or clinical management purposes  to differentiate between  SARS-CoV-2 and other Sarbecovirus currently known to infect humans.  If clinically indicated additional testing with an alternate test  methodology [phone]) is advised. The SARS-CoV-2 RNA is generally  detectable in upper and lower respiratory sp ecimens during the acute  phase of infection. The expected result is Negative. Fact Sheet for Patients:  StrictlyIdeas.no Fact Sheet for Healthcare Providers: BankingDealers.co.za This test is not yet approved or cleared by the [place] FDA and has been authorized for detection and/or diagnosis of SARS-CoV-2 by FDA under an Emergency Use Authorization (EUA).  This EUA will remain in effect (meaning this test can be used) for the duration of the COVID-19 declaration under Section 564(b)(1) of the Act, 21 U.S.C. section 360bbb-3(b)(1), unless the authorization is terminated or revoked sooner. Performed at [facility], [address] [place], [place] [address]   Urine Culture     Status: None (Preliminary result)   Collection Time: 11/13/18 11:21 PM   Specimen: Urine, Random  Result Value Ref Range Status   Specimen Description   Final    URINE, RANDOM Performed at [facility], [address] [place], [place] [address]    Special Requests   Final    NONE Performed at [facility] [address] [place], [place] [address]    Culture   Final    CULTURE REINCUBATED FOR BETTER GROWTH Performed at [facility], [place]  [address], [place], [place] [address]    Report Status PENDING  Incomplete  CULTURE, BLOOD (ROUTINE X 2) w Reflex to ID Panel     Status: None (Preliminary result)   Collection Time: 11/14/18 10:53 AM   Specimen: BLOOD  Result Value Ref Range Status   Specimen Description BLOOD  L FOREARM  Final   Special Requests   Final    BOTTLES DRAWN AEROBIC AND ANAEROBIC Blood Culture adequate volume   Culture   Final    NO GROWTH < 24 HOURS Performed at [facility], [address] [place], [place] [address]    Report Status PENDING  Incomplete  CULTURE, BLOOD (ROUTINE X 2) w Reflex  to ID Panel     Status: None (Preliminary result)   Collection Time: 11/14/18 11:04 AM   Specimen: BLOOD  Result Value Ref Range Status   Specimen Description   Final    BLOOD L HAND Performed at [facility], [address], [place], [place] [address]    Special Requests   Final    BOTTLES DRAWN AEROBIC AND ANAEROBIC Blood Culture adequate volume Performed at [facility], [address] [place], [place] [address]    Culture  Setup Time   Final    GRAM POSITIVE COCCI IN BOTH AEROBIC AND ANAEROBIC BOTTLES CRITICAL RESULT CALLED TO, READ BACK BY AND VERIFIED WITH: CHARLES SHANLEVER AT 4627 ON 11/15/2018 [place]. Performed at [facility], [place] [address], [place], [place] [address]    Culture GRAM POSITIVE COCCI  Final   Report Status PENDING  Incomplete  Blood Culture ID Panel (Reflexed)     Status: Abnormal   Collection Time: 11/14/18 11:04 AM  Result Value Ref Range Status   Enterococcus species NOT DETECTED NOT DETECTED Final   Listeria monocytogenes NOT DETECTED NOT DETECTED Final   Staphylococcus species DETECTED (A) NOT DETECTED Final    Comment: CRITICAL RESULT CALLED TO, READ BACK BY AND VERIFIED WITH: CHARLES SHANLEVER AT 9381 ON 11/15/2018 [place].    Staphylococcus aureus (BCID) DETECTED (A) NOT DETECTED Final    Comment: Methicillin (oxacillin)-resistant Staphylococcus  aureus (MRSA). MRSA is predictably resistant to beta-lactam antibiotics (except ceftaroline). Preferred therapy is vancomycin unless clinically contraindicated. Patient requires contact precautions if  hospitalized. CRITICAL RESULT CALLED TO, READ BACK BY AND VERIFIED WITH: CHARLES SHANLEVER AT 8299 ON 11/15/2018 [place].    Methicillin resistance DETECTED (A) NOT DETECTED Final    Comment: CRITICAL RESULT CALLED TO, READ BACK BY AND VERIFIED WITH: CHARLES SHANLEVER AT [phone] ON 11/15/2018 [place].    Streptococcus species NOT DETECTED NOT DETECTED Final   Streptococcus agalactiae NOT DETECTED NOT DETECTED Final   Streptococcus pneumoniae NOT DETECTED NOT DETECTED Final   Streptococcus pyogenes NOT DETECTED NOT DETECTED Final   Acinetobacter baumannii NOT DETECTED NOT DETECTED Final   Enterobacteriaceae species NOT DETECTED NOT DETECTED Final   Enterobacter cloacae complex NOT DETECTED NOT DETECTED Final   Escherichia coli NOT DETECTED NOT DETECTED Final   Klebsiella oxytoca NOT DETECTED NOT DETECTED Final   Klebsiella pneumoniae NOT DETECTED NOT DETECTED Final   Proteus species NOT DETECTED NOT DETECTED Final   Serratia marcescens NOT DETECTED NOT DETECTED Final   Haemophilus influenzae NOT DETECTED NOT DETECTED Final   Neisseria meningitidis NOT DETECTED NOT DETECTED Final   Pseudomonas aeruginosa NOT DETECTED NOT DETECTED Final   Candida albicans NOT DETECTED NOT DETECTED Final   Candida glabrata NOT DETECTED NOT DETECTED Final   Candida krusei NOT DETECTED NOT DETECTED Final   Candida parapsilosis NOT DETECTED NOT DETECTED Final   Candida tropicalis NOT DETECTED NOT DETECTED Final    Comment: Performed at [facility] [place] [facility], [address] [place], [place] [address]    Coagulation Studies: No results for input(s): LABPROT, INR in the last 72 hours.  Urinalysis: No results for input(s): COLORURINE, LABSPEC, PHURINE, GLUCOSEU, HGBUR, BILIRUBINUR, KETONESUR, PROTEINUR, UROBILINOGEN,  NITRITE, LEUKOCYTESUR in the last 72 hours.  Invalid input(s): APPERANCEUR    Imaging: No results found.   Medications:   .  sodium bicarbonate (isotonic) infusion in sterile water 75 mL/hr at 11/14/18 2003   . aspirin EC  81 mg Oral Daily  . Chlorhexidine Gluconate Cloth  6 each Topical Q0600  . digoxin  0.25 mg Oral Daily  . docusate sodium  100 mg Oral BID  . finasteride  5 mg Oral Daily  . folic acid  1 mg Oral Daily  . gabapentin  300 mg Oral BID  . loratadine  10 mg Oral Daily  . LORazepam  0.5 mg Oral Daily  . metoprolol succinate  12.5 mg Oral Daily  . mupirocin ointment  1 application Nasal BID  . vancomycin variable dose per unstable renal function (pharmacist dosing)   Does not apply See admin instructions   acetaminophen **OR** acetaminophen, bisacodyl, hydrOXYzine, ondansetron **OR** ondansetron (ZOFRAN) IV, senna  Assessment/ Plan:  63 y.o. male with medical problems of ch sys CHF, CKD, A Fib, HTN who is a resident of group home is admitted to [facility] on 11/10/2018 for evaluation of weakness, weight loss. Had left femoral neck fracture in 10/2018  1. AKI, bilateral hydronephrosis 2. Hypercalcemia 3. Weight loss, cachexia 4. Anemia 5.  Bladder mass 6.  Metabolic acidosis.  Plan: Patient continues to have compromised renal function.  BUN currently 49 with a creatinine of 3.0.  Patient to undergo TURBT midweek per urology.  No urgent indication for dialysis as the patient is having good urine output at the moment.  Further plan as patient progresses.  LOS: 5 Dainelle Hun 8/10/202011:55 AM  [place], [place]  Note: This note was prepared with Dragon dictation. Any transcription errors are unintentional

## 2018-11-15 NOTE — Progress Notes (Addendum)
Pharmacy Antibiotic Note  Ruben Clark is a 63 y.o. male admitted on 11/10/2018 with sepsis.  Pharmacy has been consulted for Vancomycin and Cefepime dosing. Cefepime was discontinued d/t blood cultures.   Vancomycin 1500 mg (loading dose) last given on 11/14/2018 @1144  - next dose was scheduled for 8/11 @ 1200.   Plan: Given renal function, will obtain a Vancomycin random level on 8/11 @ 1300. Will order and adjust dose accordingly.   Height: 5\' 10"  (177.8 cm) Weight: 163 lb (73.9 kg) IBW/kg (Calculated) : 73  Temp (24hrs), Avg:98.5 F (36.9 C), Min:97.5 F (36.4 C), Max:100.6 F (38.1 C)  Recent Labs  Lab 11/11/18 0539 11/12/18 0636 11/13/18 0534 11/14/18 0540 11/14/18 1053 11/15/18 0455  WBC 14.2* 12.4* 14.5* 24.4*  --  21.5*  CREATININE 2.67* 2.45* 2.28* 3.06*  --  3.00*  LATICACIDVEN  --   --   --   --  1.8  --     Estimated Creatinine Clearance: 26.4 mL/min (A) (by C-G formula based on SCr of 3 mg/dL (H)).    No Known Allergies  Antimicrobials this admission: Vancomycin  8/9 >>  Cefepime 8/9 >> 8/10  Thank you for allowing pharmacy to be a part of this patient's care.  Kristeen Miss, PharmD Clinical Pharmacist 11/15/2018 8:06 AM

## 2018-11-15 NOTE — Progress Notes (Addendum)
[facility] NAME: Ruben Clark    MR#:  [id number]  DATE OF BIRTH:  Oct 23, 1955  SUBJECTIVE:  CHIEF COMPLAINT:   Chief Complaint  Patient presents with  . Weakness  significant weight loss, Has periods of hallucination this morning  poor appetite Generalized weakness No fever REVIEW OF SYSTEMS:  Review of Systems  Constitutional: Positive for malaise/fatigue and weight loss. Negative for diaphoresis and fever.  HENT: Negative for ear discharge, ear pain, hearing loss, nosebleeds, sore throat and tinnitus.   Eyes: Negative for blurred vision and pain.  Respiratory: Negative for cough, hemoptysis, shortness of breath and wheezing.   Cardiovascular: Negative for chest pain, palpitations, orthopnea and leg swelling.  Gastrointestinal: Negative for abdominal pain, blood in stool, constipation, diarrhea, heartburn, nausea and vomiting.  Genitourinary: Negative for dysuria, frequency and urgency.  Musculoskeletal: Negative for back pain and myalgias.  Skin: Negative for itching and rash.  Neurological: Negative for dizziness, tingling, tremors, focal weakness, seizures, weakness and headaches.  Psychiatric/Behavioral: Negative for depression. The patient is not nervous/anxious.    DRUG ALLERGIES:  No Known Allergies VITALS:  Blood pressure 101/76, pulse 79, temperature 98.2 F (36.8 C), temperature source Oral, resp. rate 18, height 5\' 10"  (1.778 m), weight 73.9 kg, SpO2 100 %. PHYSICAL EXAMINATION:  Physical Exam Constitutional:      Appearance: He is cachectic. He is ill-appearing.  HENT:     Head: Normocephalic and atraumatic.  Eyes:     Conjunctiva/sclera: Conjunctivae normal.     Pupils: Pupils are equal, round, and reactive to light.  Neck:     Musculoskeletal: Normal range of motion and neck supple.     Thyroid: No thyromegaly.     Trachea: No tracheal deviation.  Cardiovascular:     Rate and Rhythm: Normal  rate and regular rhythm.     Heart sounds: Normal heart sounds.  Pulmonary:     Effort: Pulmonary effort is normal. No respiratory distress.     Breath sounds: Normal breath sounds. No wheezing.  Chest:     Chest wall: No tenderness.  Abdominal:     General: Bowel sounds are normal. There is no distension.     Palpations: Abdomen is soft.     Tenderness: There is no abdominal tenderness.  Musculoskeletal: Normal range of motion.  Skin:    General: Skin is warm and dry.     Findings: No rash.  Neurological:     Mental Status: He is alert and oriented to person, place, and time.     Cranial Nerves: No cranial nerve deficit.    LABORATORY PANEL:  Male CBC Recent Labs  Lab 11/15/18 0455  WBC 21.5*  HGB 9.3*  HCT 30.3*  PLT 383   ------------------------------------------------------------------------------------------------------------------ Chemistries  Recent Labs  Lab 11/10/18 1840  11/15/18 0455  NA  --    < > 142  K  --    < > 3.6  CL  --    < > 115*  CO2  --    < > 20*  GLUCOSE  --    < > 96  BUN  --    < > 49*  CREATININE  --    < > 3.00*  CALCIUM  --    < > 12.0*  MG 2.4  --   --    < > = values in this interval not displayed.   RADIOLOGY:  No results found. ASSESSMENT AND PLAN:  63 year old male patient with a known history of hip fracture, chronic congestive heart failure, CKD stage III, hypertension, chronic afib on eliquis was referred from group home.  Patient recently had hip fracture which was repaired and sent to rehab.  Day ago patient was sent from rehab group home.  He has generalized weakness.    * Acute hypercalcemia and significant unintentional weight loss Improving Current calcium is 12 Due to malignancy -Improved some with IV fluid hydration with normal saline Follow-up calcium levels  *Bladder mass High suspicion for urothelial malignancy metastaic -Has bilateral hydronephrosis -Appreciate urology evaluation -Plan for cystoscopy  TURBT and pyelogram mid week -Appreciate nephrology follow-up -Follow-up with family -Appreciate oncology evaluation -Discussions ongoing regarding chemotherapy and immunotherapy with the patient and family -palliative care consult  *Sepsis Secondary to MRSA bacteremia IV vancomycin antibiotic on board WBC count improving  * Dehydration -Continue IV fluids  -Hypo-tension secondary to dehydration IV fluids and recheck blood pressure  -History of hip fracture status post ORIF Physical therapy to continue  -Acute on chronic kidney disease stage III -creatinine at 3.0 today.  Likely postobstructive with some prerenal component -Monitor while on IV fluids Avoid nephrotoxic meds -Nephrology input appreciated  -DVT prophylaxis On anticoagulation with Eliquis  -Chronic Afib Continue eliquis for anticoagulation  -Tobacco abuse Tobacco cessation counseled for six minutes by admitting doctor Nicotine patch offered, but patient did not want it.  -Covid 19 test negative  -Ambulatory dysfunction Physical therapy evaluation  Overall very poor prognosis. 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 Management plans discussed with the patient, nursing and they are in agreement.  CODE STATUS: Full Code  TOTAL TIME TAKING CARE OF THIS PATIENT: 35 minutes More than 50% of the time was spent in counseling/coordination of care: YES  POSSIBLE D/C IN 2-3 DAYS, DEPENDING ON CLINICAL CONDITION.   Saundra Shelling M.D on 11/15/2018 at 11:58 AM  Between 7am to 6pm - Pager - [phone]  After 6pm go to [email or web address] - Proofreader  [facility]  Office  [phone]  CC: Primary care physician; System, Pcp Not In  Note: This dictation was prepared with Dragon dictation along with smaller phrase technology. Any transcriptional  errors that result from this process are unintentional.

## 2018-11-15 NOTE — Progress Notes (Signed)
[facility] Hematology/Oncology Progress Note  Date of admission: 11/10/2018  Hospital day:  11/15/2018  Voicemail message left today with patient's guardian, [name] [phone]), to discuss patient's medical condition.  Will try again tomorrow.   [name], MD  11/15/2018, 4:47 PM

## 2018-11-15 NOTE — Consult Note (Addendum)
NAME: Ruben Clark  DOB: Dec 29, 1955  MRN: [id number]  Date/Time: 11/15/2018 9:27 AM  REQUESTING PROVIDER: . Dr. Estanislado Pandy Subjective:  REASON FOR CONSULT: MRSA bacteremia ?Patient not a great historian.  Chart reviewed Ruben Clark is a 63 y.o. male with a history of chronic combined systolic and diastolic CHF, hypertension, chronic A. 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  Patient has had multiple hospitalizations since July this year.  Patient lives in a group home and on on July 1 he presented to the ED with frequent falls.  He also was complaining of difficulty in voiding with lower abdominal pain.  During that hospitalization he was treated for UTI as the urine showed more than 50 WBCs.  Had  negative blood culture, and urine culture grew MRSA.  Also had AKI on CKD Which improved with fluids.  Did not have any imaging during that visit.  He was discharged on doxycycline .He was readmitted on 10/19/2018 after had left hip fracture and underwent ORIF on the same day by Dr. Rudene Christians.  He was discharged to rehab on 10/21/2018.  He was discharged back to group home first week of August but as he was not able to walk he came back to the ED on 11/10/2018. Patient has been complaining of shortness of breath and weakness for the past 3 days.  In the ED his vitals were blood pressure of 102/67, pulse rate of 90, respiratory rate of 17, temperature of 97.6 and sats of 99%.  Labs revealed a WBC of 13.6, hemoglobin of 10.4, platelet of 519, CO2 of 19, BUN of 55, creatinine of 2.76.  Calcium was 14.2.  Started on IV fluids He had ultrasound of the kidneys that revealed bilateral Hydronephrosis with with possible bladder tumor. CT of the abdomen showed bilateral hydronephrosis and hydroureter to the bladder.  The  bladder was distended with a large multifocal mass.  Liver lesions were noted however could not be evaluated without contrast.  He has been seen by nephrologist, oncologist and urologist. Urologist is planning to take him for TURBT and retrograde pyelogram with possible bilateral ureteral stent placement.  He had a temperature of 100.6 on 11/14/2018 and blood cultures were sent and that is showing MRSA and I am seeing the patient for the same.  Past Medical History:  Diagnosis Date   CHF (congestive heart failure) ([place])    Chronic kidney disease (CKD), stage III (moderate) ([place])    Hypertension     Past Surgical History:  Procedure Laterality Date   AMPUTATION Left 08/03/2018   Procedure: AMPUTATION RAY SECOND TOE;  Surgeon: Sharlotte Alamo, DPM;  Location: [facility] ORS;  Service: Podiatry;  Laterality: Left;   HIP PINNING,CANNULATED Left 10/19/2018   Procedure: CANNULATED HIP PINNING;  Surgeon: Hessie Knows, MD;  Location: [facility] ORS;  Service: Orthopedics;  Laterality: Left;   IRRIGATION AND DEBRIDEMENT FOOT Left 08/05/2018   Procedure: IRRIGATION AND DEBRIDEMENT FOOT;  Surgeon: Sharlotte Alamo, DPM;  Location: [facility] ORS;  Service: Podiatry;  Laterality: Left;   TOE AMPUTATION        SH Smoker Lives in a group home  No Known Allergies   FH- mom died of lung cancer Father died stomach cancer   No Known Allergies I  ? Current Facility-Administered Medications  Medication Dose Route  Frequency Provider Last Rate Last Dose   acetaminophen (TYLENOL) tablet 650 mg  650 mg Oral Q6H PRN Saundra Shelling, MD   650 mg at 11/13/18 0137   Or   acetaminophen (TYLENOL) suppository 650 mg  650 mg Rectal Q6H PRN Saundra Shelling, MD       aspirin EC tablet 81 mg  81 mg Oral Daily Pyreddy, Reatha Harps, MD   81 mg at 11/14/18 1004   bisacodyl (DULCOLAX) EC tablet 5 mg  5 mg Oral Daily PRN Saundra Shelling, MD       Chlorhexidine Gluconate Cloth 2 % PADS 6 each  6 each Topical Q0600 Max Sane, MD   6 each at  11/15/18 0548   digoxin (LANOXIN) tablet 0.25 mg  0.25 mg Oral Daily Pyreddy, Reatha Harps, MD   0.25 mg at 11/14/18 1004   docusate sodium (COLACE) capsule 100 mg  100 mg Oral BID Saundra Shelling, MD   100 mg at 11/14/18 2227   finasteride (PROSCAR) tablet 5 mg  5 mg Oral Daily Pyreddy, Reatha Harps, MD   5 mg at 37/85/88 5027   folic acid (FOLVITE) tablet 1 mg  1 mg Oral Daily Corcoran, Melissa C, MD       gabapentin (NEURONTIN) capsule 300 mg  300 mg Oral BID Saundra Shelling, MD   300 mg at 11/14/18 2227   hydrOXYzine (ATARAX/VISTARIL) tablet 25 mg  25 mg Oral Q8H PRN Saundra Shelling, MD       loratadine (CLARITIN) tablet 10 mg  10 mg Oral Daily Pyreddy, Reatha Harps, MD   10 mg at 11/14/18 1004   LORazepam (ATIVAN) tablet 0.5 mg  0.5 mg Oral Daily Pyreddy, Reatha Harps, MD   0.5 mg at 11/14/18 1004   metoprolol succinate (TOPROL-XL) 24 hr tablet 12.5 mg  12.5 mg Oral Daily Pyreddy, Reatha Harps, MD   12.5 mg at 11/14/18 1004   mupirocin ointment (BACTROBAN) 2 % 1 application  1 application Nasal BID Max Sane, MD   1 application at 74/12/87 2227   ondansetron (ZOFRAN) tablet 4 mg  4 mg Oral Q6H PRN Saundra Shelling, MD       Or   ondansetron (ZOFRAN) injection 4 mg  4 mg Intravenous Q6H PRN Saundra Shelling, MD       senna (SENOKOT) tablet 8.6 mg  1 tablet Oral Daily PRN Pyreddy, Reatha Harps, MD       sodium bicarbonate 150 mEq in sterile water 1,000 mL infusion   Intravenous Continuous Murlean Iba, MD 75 mL/hr at 11/14/18 2003     vancomycin variable dose per unstable renal function (pharmacist dosing)   Does not apply See admin instructions [name], RPH         Abtx:  Anti-infectives (From admission, onward)   Start     Dose/Rate Route Frequency Ordered Stop   11/16/18 1000  vancomycin (VANCOCIN) 1,250 mg in sodium chloride 0.9 % 250 mL IVPB  Status:  Discontinued     1,250 mg 166.7 mL/hr over 90 Minutes Intravenous Every 48 hours 11/14/18 1010 11/15/18 0812   11/15/18 0805  vancomycin variable dose per  unstable renal function (pharmacist dosing)      Does not apply See admin instructions 11/15/18 0812     11/14/18 1015  vancomycin (VANCOCIN) 1,500 mg in sodium chloride 0.9 % 500 mL IVPB     1,500 mg 250 mL/hr over 120 Minutes Intravenous  Once 11/14/18 1000 11/14/18 1352   11/14/18 1000  ceFEPIme (MAXIPIME) 2 g in sodium chloride 0.9 %  100 mL IVPB  Status:  Discontinued     2 g 200 mL/hr over 30 Minutes Intravenous Every 24 hours 11/14/18 0959 11/15/18 0803   11/14/18 1000  vancomycin (VANCOCIN) 1,500 mg in sodium chloride 0.9 % 500 mL IVPB  Status:  Discontinued     1,500 mg 250 mL/hr over 120 Minutes Intravenous  Once 11/14/18 0959 11/14/18 1000      REVIEW OF SYSTEMS:  Const: Denies fever, negative chills, negative weight loss Eyes: negative diplopia or visual changes, negative eye pain ENT: negative coryza, negative sore throat Resp: Some cough, hemoptysis, dyspnea Cards: negative for chest pain, palpitations, lower extremity edema GU: Some difficulty in passing urine. GI: Some abdominal pain, diarrhea, no bleeding, constipation Skin: negative for rash and pruritus Heme: negative for easy bruising and gum/nose bleeding MS: Generalized weakness and bilateral leg weakness Neurolo:negative for headaches, dizziness, vertigo, memory problems  Psych: negative for feelings of anxiety, depression  Endocrine: negative for thyroid, diabetes Allergy/Immunology-none Objective:  VITALS:  BP 101/76 (BP Location: Right Arm)    Pulse 79    Temp 98.2 F (36.8 C) (Oral)    Resp 18    Ht 5\' 10"  (1.778 m)    Wt 73.9 kg    SpO2 100%    BMI 23.39 kg/m  PHYSICAL EXAM:  General: Alert, cooperative, no distress, appears stated age.  Thin.  Not very communicative Head: Normocephalic, without obvious abnormality, atraumatic. Eyes: Conjunctivae clear, anicteric sclerae. Pupils are equal ENT Nares normal. No drainage or sinus tenderness. Lips, mucosa, and tongue normal. No Thrush Neck: Supple,  symmetrical, no adenopathy, thyroid: non tender no carotid bruit and no JVD. Back: No CVA tenderness. Lungs: Bilateral air entry Heart: Irregular Abdomen: Firm mass felt about the suprapubic area halfway to the umbilicus. Extremities: Left foot foot amputated first and second toe - healed very well  Left hip surgical scar healed well no erythema or tenderness. skin: No rashes or lesions. Or bruising Lymph: Cervical, supraclavicular normal. Neurologic: Grossly non-focal Pertinent Labs Lab Results CBC    Component Value Date/Time   WBC 21.5 (H) 11/15/2018 0455   RBC 3.23 (L) 11/15/2018 0455   HGB 9.3 (L) 11/15/2018 0455   HCT 30.3 (L) 11/15/2018 0455   PLT 383 11/15/2018 0455   MCV 93.8 11/15/2018 0455   MCH 28.8 11/15/2018 0455   MCHC 30.7 11/15/2018 0455   RDW 15.5 11/15/2018 0455   LYMPHSABS 1.0 11/10/2018 1631   MONOABS 1.4 (H) 11/10/2018 1631   EOSABS 0.1 11/10/2018 1631   BASOSABS 0.1 11/10/2018 1631    CMP Latest Ref Rng & Units 11/15/2018 11/14/2018 11/13/2018  Glucose 70 - 99 mg/dL 96 76 93  BUN 8 - 23 mg/dL 49(H) 51(H) 45(H)  Creatinine 0.61 - 1.24 mg/dL 3.00(H) 3.06(H) 2.28(H)  Sodium 135 - 145 mmol/L 142 141 141  Potassium 3.5 - 5.1 mmol/L 3.6 4.1 4.2  Chloride 98 - 111 mmol/L 115(H) 119(H) 117(H)  CO2 22 - 32 mmol/L 20(L) 15(L) 20(L)  Calcium 8.9 - 10.3 mg/dL 12.0(H) 12.1(H) 12.4(H)  Total Protein 6.5 - 8.1 g/dL - - -  Total Bilirubin 0.3 - 1.2 mg/dL - - -  Alkaline Phos 38 - 126 U/L - - -  AST 15 - 41 U/L - - -  ALT 0 - 44 U/L - - -      Microbiology: Recent Results (from the past 240 hour(s))  SARS Coronavirus 2 [facility] order, Performed in [facility] hospital lab) Nasopharyngeal Nasopharyngeal Swab  Status: None   Collection Time: 11/10/18  6:40 PM   Specimen: Nasopharyngeal Swab  Result Value Ref Range Status   SARS Coronavirus 2 NEGATIVE NEGATIVE Final    Comment: (NOTE) If result is NEGATIVE SARS-CoV-2 target nucleic acids are NOT  DETECTED. The SARS-CoV-2 RNA is generally detectable in upper and lower  respiratory specimens during the acute phase of infection. The lowest  concentration of SARS-CoV-2 viral copies this assay can detect is 250  copies / mL. A negative result does not preclude SARS-CoV-2 infection  and should not be used as the sole basis for treatment or other  patient management decisions.  A negative result may occur with  improper specimen collection / handling, submission of specimen other  than nasopharyngeal swab, presence of viral mutation(s) within the  areas targeted by this assay, and inadequate number of viral copies  (<250 copies / mL). A negative result must be combined with clinical  observations, patient history, and epidemiological information. If result is POSITIVE SARS-CoV-2 target nucleic acids are DETECTED. The SARS-CoV-2 RNA is generally detectable in upper and lower  respiratory specimens dur ing the acute phase of infection.  Positive  results are indicative of active infection with SARS-CoV-2.  Clinical  correlation with patient history and other diagnostic information is  necessary to determine patient infection status.  Positive results do  not rule out bacterial infection or co-infection with other viruses. If result is PRESUMPTIVE POSTIVE SARS-CoV-2 nucleic acids MAY BE PRESENT.   A presumptive positive result was obtained on the submitted specimen  and confirmed on repeat testing.  While 2019 novel coronavirus  (SARS-CoV-2) nucleic acids may be present in the submitted sample  additional confirmatory testing may be necessary for epidemiological  and / or clinical management purposes  to differentiate between  SARS-CoV-2 and other Sarbecovirus currently known to infect humans.  If clinically indicated additional testing with an alternate test  methodology [phone]) is advised. The SARS-CoV-2 RNA is generally  detectable in upper and lower respiratory sp ecimens during  the acute  phase of infection. The expected result is Negative. Fact Sheet for Patients:  StrictlyIdeas.no Fact Sheet for Healthcare Providers: BankingDealers.co.za This test is not yet approved or cleared by the [place] FDA and has been authorized for detection and/or diagnosis of SARS-CoV-2 by FDA under an Emergency Use Authorization (EUA).  This EUA will remain in effect (meaning this test can be used) for the duration of the COVID-19 declaration under Section 564(b)(1) of the Act, 21 U.S.C. section 360bbb-3(b)(1), unless the authorization is terminated or revoked sooner. Performed at [facility], [address], [place], [place] [address]   Urine Culture     Status: None (Preliminary result)   Collection Time: 11/13/18 11:21 PM   Specimen: Urine, Random  Result Value Ref Range Status   Specimen Description   Final    URINE, RANDOM Performed at [facility], [address], [place], [place] [address]    Special Requests   Final    NONE Performed at [facility], [address], [place], [place] [address]    Culture   Final    CULTURE REINCUBATED FOR BETTER GROWTH Performed at [facility], [place] [address] [place], [place] [address]    Report Status PENDING  Incomplete  CULTURE, BLOOD (ROUTINE X 2) w Reflex to ID Panel     Status: None (Preliminary result)   Collection Time: 11/14/18 10:53 AM   Specimen: BLOOD  Result Value  Ref Range Status   Specimen Description BLOOD  L FOREARM  Final   Special Requests   Final    BOTTLES DRAWN AEROBIC AND ANAEROBIC Blood Culture adequate volume   Culture   Final    NO GROWTH < 24 HOURS Performed at [facility], [address] [place], [place] [address]    Report Status PENDING  Incomplete  CULTURE, BLOOD (ROUTINE X 2) w Reflex to ID Panel     Status: None (Preliminary result)   Collection Time: 11/14/18 11:04 AM   Specimen: BLOOD   Result Value Ref Range Status   Specimen Description BLOOD L HAND  Final   Special Requests   Final    BOTTLES DRAWN AEROBIC AND ANAEROBIC Blood Culture adequate volume   Culture  Setup Time   Final    Organism ID to follow GRAM POSITIVE COCCI IN BOTH AEROBIC AND ANAEROBIC BOTTLES CRITICAL RESULT CALLED TO, READ BACK BY AND VERIFIED WITH: CHARLES SHANLEVER AT 6045 ON 11/15/2018 [place]. Performed at [facility], [address] [place], [place] [address]    Culture [facility] POSITIVE COCCI  Final   Report Status PENDING  Incomplete  Blood Culture ID Panel (Reflexed)     Status: Abnormal   Collection Time: 11/14/18 11:04 AM  Result Value Ref Range Status   Enterococcus species NOT DETECTED NOT DETECTED Final   Listeria monocytogenes NOT DETECTED NOT DETECTED Final   Staphylococcus species DETECTED (A) NOT DETECTED Final    Comment: CRITICAL RESULT CALLED TO, READ BACK BY AND VERIFIED WITH: CHARLES SHANLEVER AT 1914 ON 11/15/2018 [place].    Staphylococcus aureus (BCID) DETECTED (A) NOT DETECTED Final    Comment: Methicillin (oxacillin)-resistant Staphylococcus aureus (MRSA). MRSA is predictably resistant to beta-lactam antibiotics (except ceftaroline). Preferred therapy is vancomycin unless clinically contraindicated. Patient requires contact precautions if  hospitalized. CRITICAL RESULT CALLED TO, READ BACK BY AND VERIFIED WITH: CHARLES SHANLEVER AT 7829 ON 11/15/2018 [place].    Methicillin resistance DETECTED (A) NOT DETECTED Final    Comment: CRITICAL RESULT CALLED TO, READ BACK BY AND VERIFIED WITH: CHARLES SHANLEVER AT [id number] ON 11/15/2018 [place].    Streptococcus species NOT DETECTED NOT DETECTED Final   Streptococcus agalactiae NOT DETECTED NOT DETECTED Final   Streptococcus pneumoniae NOT DETECTED NOT DETECTED Final   Streptococcus pyogenes NOT DETECTED NOT DETECTED Final   Acinetobacter baumannii NOT DETECTED NOT DETECTED Final   Enterobacteriaceae species NOT DETECTED NOT DETECTED  Final   Enterobacter cloacae complex NOT DETECTED NOT DETECTED Final   Escherichia coli NOT DETECTED NOT DETECTED Final   Klebsiella oxytoca NOT DETECTED NOT DETECTED Final   Klebsiella pneumoniae NOT DETECTED NOT DETECTED Final   Proteus species NOT DETECTED NOT DETECTED Final   Serratia marcescens NOT DETECTED NOT DETECTED Final   Haemophilus influenzae NOT DETECTED NOT DETECTED Final   Neisseria meningitidis NOT DETECTED NOT DETECTED Final   Pseudomonas aeruginosa NOT DETECTED NOT DETECTED Final   Candida albicans NOT DETECTED NOT DETECTED Final   Candida glabrata NOT DETECTED NOT DETECTED Final   Candida krusei NOT DETECTED NOT DETECTED Final   Candida parapsilosis NOT DETECTED NOT DETECTED Final   Candida tropicalis NOT DETECTED NOT DETECTED Final    Comment: Performed at [facility], [address] [place], [place] [address]    IMAGING RESULTS:      I have personally reviewed the films ? Impression/Recommendation ? MRSA bacteremia: Unclear source-- possible from the bladder tumor.  In July he had MRSA in the  urine but blood culture was negative. Will need 2D echo/TEE We will repeat blood cultures until clear of bacteremia Continue vancomycin  Hypercalcemia causing weakness.  Seen by oncologist and is being corrected. Because of hypercalcemia likely due to underlying malignancy.    Palpable bladder mass with bilateral hydronephrosis. Seen by urologist and planning TURBT with stents  AKI on CKD  Hardware left hip status post ORIF in  mid July.   the site does not look infected  A. fib ? ?Discussed the management with the patient in  detail. Note:  This document was prepared using Dragon voice recognition software and may include unintentional dictation errors.

## 2018-11-15 NOTE — Progress Notes (Signed)
Patient will be placed on isolation for positive blood cultures. Patient hallucinating today as well.  Pyreddy text paged and made aware.

## 2018-11-16 ENCOUNTER — Inpatient Hospital Stay (HOSPITAL_COMMUNITY)
Admit: 2018-11-16 | Discharge: 2018-11-16 | Disposition: A | Discharge status: Home / Self Care | Payer: Medicare HMO | Attending: Infectious Diseases | Admitting: Infectious Diseases

## 2018-11-16 DIAGNOSIS — R7881 Bacteremia: Secondary | ICD-10-CM

## 2018-11-16 LAB — PROTEIN ELECTRO, RANDOM URINE
Albumin ELP, Urine: 36.3 %
Alpha-1-Globulin, U: 15 %
Alpha-2-Globulin, U: 16.3 %
Beta Globulin, U: 19.1 %
Gamma Globulin, U: 13.2 %
M Component, Ur: 4 % — ABNORMAL HIGH
Total Protein, Urine: 120.8 mg/dL

## 2018-11-16 LAB — MULTIPLE MYELOMA PANEL, SERUM
Albumin SerPl Elph-Mcnc: 2.5 g/dL — ABNORMAL LOW (ref 2.9–4.4)
Albumin/Glob SerPl: 0.7 (ref 0.7–1.7)
Alpha 1: 0.4 g/dL (ref 0.0–0.4)
Alpha2 Glob SerPl Elph-Mcnc: 1 g/dL (ref 0.4–1.0)
B-Globulin SerPl Elph-Mcnc: 0.7 g/dL (ref 0.7–1.3)
Gamma Glob SerPl Elph-Mcnc: 1.6 g/dL (ref 0.4–1.8)
Globulin, Total: 3.7 g/dL (ref 2.2–3.9)
IgA: 338 mg/dL (ref 61–437)
IgG (Immunoglobin G), Serum: 1566 mg/dL (ref 603–1613)
IgM (Immunoglobulin M), Srm: 298 mg/dL — ABNORMAL HIGH (ref 20–172)
Total Protein ELP: 6.2 g/dL (ref 6.0–8.5)

## 2018-11-16 LAB — BASIC METABOLIC PANEL WITH GFR
Anion gap: 8 (ref 5–15)
BUN: 53 mg/dL — ABNORMAL HIGH (ref 8–23)
CO2: 23 mmol/L (ref 22–32)
Calcium: 11.7 mg/dL — ABNORMAL HIGH (ref 8.9–10.3)
Chloride: 107 mmol/L (ref 98–111)
Creatinine, Ser: 3.25 mg/dL — ABNORMAL HIGH (ref 0.61–1.24)
GFR calc Af Amer: 22 mL/min — ABNORMAL LOW
GFR calc non Af Amer: 19 mL/min — ABNORMAL LOW
Glucose, Bld: 98 mg/dL (ref 70–99)
Potassium: 3.6 mmol/L (ref 3.5–5.1)
Sodium: 138 mmol/L (ref 135–145)

## 2018-11-16 LAB — CBC
HCT: 29.7 % — ABNORMAL LOW (ref 39.0–52.0)
Hemoglobin: 9.6 g/dL — ABNORMAL LOW (ref 13.0–17.0)
MCH: 28.9 pg (ref 26.0–34.0)
MCHC: 32.3 g/dL (ref 30.0–36.0)
MCV: 89.5 fL (ref 80.0–100.0)
Platelets: 404 10*3/uL — ABNORMAL HIGH (ref 150–400)
RBC: 3.32 MIL/uL — ABNORMAL LOW (ref 4.22–5.81)
RDW: 15.4 % (ref 11.5–15.5)
WBC: 22.9 10*3/uL — ABNORMAL HIGH (ref 4.0–10.5)
nRBC: 0 % (ref 0.0–0.2)

## 2018-11-16 LAB — VANCOMYCIN, RANDOM: Vancomycin Rm: 15

## 2018-11-16 LAB — ECHOCARDIOGRAM COMPLETE
Height: 70 in
Weight: 2688 oz

## 2018-11-16 LAB — CALCITRIOL (1,25 DI-OH VIT D): Vit D, 1,25-Dihydroxy: 11.1 pg/mL — ABNORMAL LOW (ref 19.9–79.3)

## 2018-11-16 MED ORDER — VANCOMYCIN HCL 1.25 G IV SOLR
1250.0000 mg | Freq: Once | INTRAVENOUS | Status: AC
  Administered 2018-11-16: 1250 mg via INTRAVENOUS
  Filled 2018-11-16: qty 1250

## 2018-11-16 NOTE — NC FL2 (Signed)
[place] LEVEL OF CARE SCREENING TOOL     IDENTIFICATION  Patient Name: Ruben Clark Birthdate: 1955-12-05 Sex: male Admission Date (Current Location): 11/10/2018  [place] and [place] Number:  Engineering geologist and Address:  [facility], [address], [place], [place] [address]      Provider Number: [id number]  Attending Physician Name and Address:  Saundra Shelling, MD  Relative Name and Phone Number:  [name] Legal Guardian [phone]    Current Level of Care: Hospital Recommended Level of Care: [place] Prior Approval Number:    Date Approved/Denied:   PASRR Number: [id number]  Discharge Plan: SNF    Current Diagnoses: Patient Active Problem List   Diagnosis Date Noted  . Hypercalcemia 11/10/2018  . Closed left hip fracture ([place]) 10/19/2018  . Chronic systolic CHF (congestive heart failure) ([place]) 10/19/2018  . HTN (hypertension) 10/19/2018  . Sepsis ([place]) 10/07/2018  . COPD (chronic obstructive pulmonary disease) ([place]) 08/26/2018  . Atrial fibrillation ([place]) 08/26/2018  . Left foot infection 08/02/2018    Orientation RESPIRATION BLADDER Height & Weight     Self, Place  Normal Incontinent Weight: 168 lb (76.2 kg) Height:  5\' 10"  (177.8 cm)  BEHAVIORAL SYMPTOMS/MOOD NEUROLOGICAL BOWEL NUTRITION STATUS  (none) (none) Continent Diet(see dc summary)  AMBULATORY STATUS COMMUNICATION OF NEEDS Skin   Extensive Assist Verbally Normal                       Personal Care Assistance Level of Assistance  Bathing, Feeding, Dressing Bathing Assistance: Limited assistance Feeding assistance: Limited assistance Dressing Assistance: Limited assistance     Functional Limitations Info    Sight Info: Adequate Hearing Info: Adequate Speech Info: Adequate    SPECIAL CARE FACTORS FREQUENCY  PT (By licensed PT)     PT Frequency: 5 times week              Contractures Contractures Info: Not  present    Additional Factors Info  Code Status, Allergies, Psychotropic Code Status Info: full Allergies Info: NKA Psychotropic Info: Ativan         Current Medications (11/16/2018):  This is the current hospital active medication list Current Facility-Administered Medications  Medication Dose Route Frequency Provider Last Rate Last Dose  . acetaminophen (TYLENOL) tablet 650 mg  650 mg Oral Q6H PRN Saundra Shelling, MD   650 mg at 11/13/18 0137   Or  . acetaminophen (TYLENOL) suppository 650 mg  650 mg Rectal Q6H PRN Saundra Shelling, MD      . aspirin EC tablet 81 mg  81 mg Oral Daily Pyreddy, Pavan, MD   81 mg at 11/16/18 1004  . bisacodyl (DULCOLAX) EC tablet 5 mg  5 mg Oral Daily PRN Saundra Shelling, MD      . Chlorhexidine Gluconate Cloth 2 % PADS 6 each  6 each Topical Q0600 Max Sane, MD   6 each at 11/15/18 0548  . digoxin (LANOXIN) tablet 0.25 mg  0.25 mg Oral Daily Pyreddy, Pavan, MD   0.25 mg at 11/16/18 1004  . docusate sodium (COLACE) capsule 100 mg  100 mg Oral BID Saundra Shelling, MD   100 mg at 11/16/18 1004  . finasteride (PROSCAR) tablet 5 mg  5 mg Oral Daily Pyreddy, Pavan, MD   5 mg at 11/16/18 1004  . folic acid (FOLVITE) tablet 1 mg  1 mg Oral Daily Corcoran, Melissa C, MD   1 mg at 11/16/18 1004  .  gabapentin (NEURONTIN) capsule 300 mg  300 mg Oral BID Saundra Shelling, MD   300 mg at 11/16/18 1004  . hydrOXYzine (ATARAX/VISTARIL) tablet 25 mg  25 mg Oral Q8H PRN Pyreddy, Reatha Harps, MD      . loratadine (CLARITIN) tablet 10 mg  10 mg Oral Daily Pyreddy, Pavan, MD   10 mg at 11/16/18 1004  . LORazepam (ATIVAN) tablet 0.5 mg  0.5 mg Oral Daily Pyreddy, Pavan, MD   0.5 mg at 11/16/18 1004  . metoprolol succinate (TOPROL-XL) 24 hr tablet 12.5 mg  12.5 mg Oral Daily Pyreddy, Pavan, MD   12.5 mg at 11/15/18 1037  . mupirocin ointment (BACTROBAN) 2 % 1 application  1 application Nasal BID Max Sane, MD   1 application at 37/29/02 1001  . ondansetron (ZOFRAN) tablet 4 mg  4 mg  Oral Q6H PRN Saundra Shelling, MD       Or  . ondansetron (ZOFRAN) injection 4 mg  4 mg Intravenous Q6H PRN Pyreddy, Reatha Harps, MD      . senna (SENOKOT) tablet 8.6 mg  1 tablet Oral Daily PRN Pyreddy, Pavan, MD      . sodium bicarbonate 150 mEq in sterile water 1,000 mL infusion   Intravenous Continuous Murlean Iba, MD 75 mL/hr at 11/16/18 0351    . vancomycin (VANCOCIN) 1,250 mg in sodium chloride 0.9 % 250 mL IVPB  1,250 mg Intravenous Once [name], RPH      . vancomycin variable dose per unstable renal function (pharmacist dosing)   Does not apply See admin instructions [name], [facility]         Discharge Medications: Please see discharge summary for a list of discharge medications.  Relevant Imaging Results:  Relevant Lab Results:   Additional Information SSN: [id number], [place]

## 2018-11-16 NOTE — Plan of Care (Signed)
  Problem: Education: Goal: Knowledge of General Education information will improve Description: Including pain rating scale, medication(s)/side effects and non-pharmacologic comfort measures Outcome: Progressing   Problem: Safety: Goal: Ability to remain free from injury will improve Outcome: Progressing   Problem: Skin Integrity: Goal: Risk for impaired skin integrity will decrease Outcome: Progressing   

## 2018-11-16 NOTE — Progress Notes (Signed)
Pharmacy Antibiotic Note  Ruben Clark is a 63 y.o. male admitted on 11/10/2018 with sepsis.  Pharmacy has been consulted for Vancomycin and Cefepime dosing. Cefepime was discontinued d/t blood cultures.   Vancomycin 1500 mg (loading dose) last given on 11/14/2018 @1144  - next dose was scheduled for 8/11 @ 1200. Vancomycin random 15 (about ~ 48 hrs) as a result of the 1500 mg dose. Ideally, for bacteremia the trough should be 15 -20. Given worsening renal function since that dose was given, will administer a slightly lower dose and recheck VR in 48 hours.   Plan: Given renal function, will obtain a Vancomycin random level on 8/13 @ 1700. Will order and adjust dose accordingly.  Will order Vancomycin 1250 mg x1 dose.   Height: 5\' 10"  (177.8 cm) Weight: 168 lb (76.2 kg) IBW/kg (Calculated) : 73  Temp (24hrs), Avg:97.8 F (36.6 C), Min:97.6 F (36.4 C), Max:98 F (36.7 C)  Recent Labs  Lab 11/12/18 0636 11/13/18 0534 11/14/18 0540 11/14/18 1053 11/15/18 0455 11/16/18 0012  WBC 12.4* 14.5* 24.4*  --  21.5* 22.9*  CREATININE 2.45* 2.28* 3.06*  --  3.00* 3.25*  LATICACIDVEN  --   --   --  1.8  --   --     Estimated Creatinine Clearance: 24.3 mL/min (A) (by C-G formula based on SCr of 3.25 mg/dL (H)).    No Known Allergies  Antimicrobials this admission: Vancomycin  8/9 >>  Cefepime 8/9 >> 8/10  Thank you for allowing pharmacy to be a part of this patient's care.  Kristeen Miss, PharmD Clinical Pharmacist 11/16/2018 11:46 AM

## 2018-11-16 NOTE — Progress Notes (Signed)
This RN attempted to reach pts legal guardian, Ruben Clark, at [place] to obtain consent for cystoscopy and TURBT with possible bilateral ureteral stent placement that is scheduled for tomorrow but was unsuccessful.

## 2018-11-16 NOTE — Progress Notes (Signed)
[facility], [place] 11/16/18  Subjective:  Patient continues to appear quite weak. Awaiting further urologic input. Urine output yesterday was documented at 400 mL. Creatinine up to 3.25.   Objective:  Vital signs in last 24 hours:  Temp:  [97.6 F (36.4 C)-98 F (36.7 C)] 98 F (36.7 C) (08/11 0731) Pulse Rate:  [73-105] 73 (08/11 0731) Resp:  [18-19] 18 (08/11 0731) BP: (91-116)/(55-74) 91/66 (08/11 0731) SpO2:  [99 %-100 %] 99 % (08/11 0731) Weight:  [76.2 kg] 76.2 kg (08/11 0352)  Weight change: -1.136 kg Filed Weights   11/15/18 0405 11/15/18 1348 11/16/18 0352  Weight: 73.9 kg 72.8 kg 76.2 kg    Intake/Output:    Intake/Output Summary (Last 24 hours) at 11/16/2018 1401 Last data filed at 11/16/2018 1037 Gross per 24 hour  Intake 200 ml  Output 400 ml  Net -200 ml     Physical Exam: General:  Thin, cachectic gentleman, laying in the bed  HEENT  anicteric, moist oral mucous membranes  Neck  supple  Pulm/lungs  clear to auscultation, normal effort  CVS/Heart  no rub, S1-S2  Abdomen:   Fullness, firmness over lower abdomen  Extremities:  No edema  Neurologic:  Alert, able to answer few simple questions  Skin:  Dry skin  Foley in place  Basic Metabolic Panel:  Recent Labs  Lab 11/10/18 1840  11/11/18 1500 11/12/18 0636 11/13/18 0534 11/14/18 0540 11/15/18 0455 11/16/18 0012  NA  --    < >  --  142 141 141 142 138  K  --    < >  --  4.5 4.2 4.1 3.6 3.6  CL  --    < >  --  116* 117* 119* 115* 107  CO2  --    < >  --  19* 20* 15* 20* 23  GLUCOSE  --    < >  --  72 93 76 96 98  BUN  --    < >  --  48* 45* 51* 49* 53*  CREATININE  --    < >  --  2.45* 2.28* 3.06* 3.00* 3.25*  CALCIUM  --    < >  --  13.2* 12.4* 12.1* 12.0* 11.7*  MG 2.4  --   --   --   --   --   --   --   PHOS  --   --  3.5  --   --   --   --   --    < > = values in this interval not displayed.     CBC: Recent Labs  Lab 11/10/18 1631   11/12/18 0636 11/13/18 0534 11/14/18 0540 11/15/18 0455 11/16/18 0012  WBC 13.6*   < > 12.4* 14.5* 24.4* 21.5* 22.9*  NEUTROABS 11.0*  --   --   --   --   --   --   HGB 10.4*   < > 9.8* 9.6* 9.6* 9.3* 9.6*  HCT 32.8*   < > 32.1* 31.3* 31.2* 30.3* 29.7*  MCV 91.6   < > 95.3 93.2 94.0 93.8 89.5  PLT 519*   < > 468* 428* 413* 383 404*   < > = values in this interval not displayed.      Lab Results  Component Value Date   HEPBSAG Negative 11/11/2018   HEPBSAB Non Reactive 11/11/2018      Microbiology:  Recent Results (from the past 240 hour(s))  SARS Coronavirus  2 [facility] order, Performed in [facility] hospital lab) Nasopharyngeal Nasopharyngeal Swab     Status: None   Collection Time: 11/10/18  6:40 PM   Specimen: Nasopharyngeal Swab  Result Value Ref Range Status   SARS Coronavirus 2 NEGATIVE NEGATIVE Final    Comment: (NOTE) If result is NEGATIVE SARS-CoV-2 target nucleic acids are NOT DETECTED. The SARS-CoV-2 RNA is generally detectable in upper and lower  respiratory specimens during the acute phase of infection. The lowest  concentration of SARS-CoV-2 viral copies this assay can detect is 250  copies / mL. A negative result does not preclude SARS-CoV-2 infection  and should not be used as the sole basis for treatment or other  patient management decisions.  A negative result may occur with  improper specimen collection / handling, submission of specimen other  than nasopharyngeal swab, presence of viral mutation(s) within the  areas targeted by this assay, and inadequate number of viral copies  (<250 copies / mL). A negative result must be combined with clinical  observations, patient history, and epidemiological information. If result is POSITIVE SARS-CoV-2 target nucleic acids are DETECTED. The SARS-CoV-2 RNA is generally detectable in upper and lower  respiratory specimens dur ing the acute phase of infection.  Positive  results are indicative of active  infection with SARS-CoV-2.  Clinical  correlation with patient history and other diagnostic information is  necessary to determine patient infection status.  Positive results do  not rule out bacterial infection or co-infection with other viruses. If result is PRESUMPTIVE POSTIVE SARS-CoV-2 nucleic acids MAY BE PRESENT.   A presumptive positive result was obtained on the submitted specimen  and confirmed on repeat testing.  While 2019 novel coronavirus  (SARS-CoV-2) nucleic acids may be present in the submitted sample  additional confirmatory testing may be necessary for epidemiological  and / or clinical management purposes  to differentiate between  SARS-CoV-2 and other Sarbecovirus currently known to infect humans.  If clinically indicated additional testing with an alternate test  methodology [phone]) is advised. The SARS-CoV-2 RNA is generally  detectable in upper and lower respiratory sp ecimens during the acute  phase of infection. The expected result is Negative. Fact Sheet for Patients:  StrictlyIdeas.no Fact Sheet for Healthcare Providers: BankingDealers.co.za This test is not yet approved or cleared by the [place] FDA and has been authorized for detection and/or diagnosis of SARS-CoV-2 by FDA under an Emergency Use Authorization (EUA).  This EUA will remain in effect (meaning this test can be used) for the duration of the COVID-19 declaration under Section 564(b)(1) of the Act, 21 U.S.C. section 360bbb-3(b)(1), unless the authorization is terminated or revoked sooner. Performed at [facility], [address] [place], [place] [address]   Urine Culture     Status: None (Preliminary result)   Collection Time: 11/13/18 11:21 PM   Specimen: Urine, Random  Result Value Ref Range Status   Specimen Description   Final    URINE, RANDOM Performed at [facility], [address], [place], [place]  [address]    Special Requests   Final    NONE Performed at [facility], [address] [place], [place] [address]    Culture   Final    CULTURE REINCUBATED FOR BETTER GROWTH Performed at [facility], [place] [address], [place],  [address]    Report Status PENDING  Incomplete  CULTURE, BLOOD (ROUTINE X 2) w Reflex to ID Panel     Status: None (  Preliminary result)   Collection Time: 11/14/18 10:53 AM   Specimen: BLOOD  Result Value Ref Range Status   Specimen Description BLOOD  L FOREARM  Final   Special Requests   Final    BOTTLES DRAWN AEROBIC AND ANAEROBIC Blood Culture adequate volume   Culture  Setup Time   Final    GRAM POSITIVE COCCI AEROBIC BOTTLE ONLY CRITICAL VALUE NOTED.  VALUE IS CONSISTENT WITH PREVIOUSLY REPORTED AND CALLED VALUE. Performed at [facility], [address] [place], [place] [address]    Culture [facility] POSITIVE COCCI  Final   Report Status PENDING  Incomplete  CULTURE, BLOOD (ROUTINE X 2) w Reflex to ID Panel     Status: Abnormal (Preliminary result)   Collection Time: 11/14/18 11:04 AM   Specimen: BLOOD  Result Value Ref Range Status   Specimen Description   Final    BLOOD L HAND Performed at [facility], [address], [place], [place] [address]    Special Requests   Final    BOTTLES DRAWN AEROBIC AND ANAEROBIC Blood Culture adequate volume Performed at [facility], [address] [place], [place] [address]    Culture  Setup Time   Final    GRAM POSITIVE COCCI IN BOTH AEROBIC AND ANAEROBIC BOTTLES CRITICAL RESULT CALLED TO, READ BACK BY AND VERIFIED WITH: CHARLES SHANLEVER AT 1448 ON 11/15/2018 [place]. Performed at [facility], [place] [address] [place], [place] [address]    Culture STAPHYLOCOCCUS AUREUS (A)  Final   Report Status PENDING  Incomplete  Blood Culture ID Panel (Reflexed)     Status: Abnormal   Collection Time: 11/14/18 11:04 AM  Result Value Ref Range Status   Enterococcus  species NOT DETECTED NOT DETECTED Final   Listeria monocytogenes NOT DETECTED NOT DETECTED Final   Staphylococcus species DETECTED (A) NOT DETECTED Final    Comment: CRITICAL RESULT CALLED TO, READ BACK BY AND VERIFIED WITH: CHARLES SHANLEVER AT 1497 ON 11/15/2018 [place].    Staphylococcus aureus (BCID) DETECTED (A) NOT DETECTED Final    Comment: Methicillin (oxacillin)-resistant Staphylococcus aureus (MRSA). MRSA is predictably resistant to beta-lactam antibiotics (except ceftaroline). Preferred therapy is vancomycin unless clinically contraindicated. Patient requires contact precautions if  hospitalized. CRITICAL RESULT CALLED TO, READ BACK BY AND VERIFIED WITH: CHARLES SHANLEVER AT 0263 ON 11/15/2018 [place].    Methicillin resistance DETECTED (A) NOT DETECTED Final    Comment: CRITICAL RESULT CALLED TO, READ BACK BY AND VERIFIED WITH: CHARLES SHANLEVER AT [phone] ON 11/15/2018 [place].    Streptococcus species NOT DETECTED NOT DETECTED Final   Streptococcus agalactiae NOT DETECTED NOT DETECTED Final   Streptococcus pneumoniae NOT DETECTED NOT DETECTED Final   Streptococcus pyogenes NOT DETECTED NOT DETECTED Final   Acinetobacter baumannii NOT DETECTED NOT DETECTED Final   Enterobacteriaceae species NOT DETECTED NOT DETECTED Final   Enterobacter cloacae complex NOT DETECTED NOT DETECTED Final   Escherichia coli NOT DETECTED NOT DETECTED Final   Klebsiella oxytoca NOT DETECTED NOT DETECTED Final   Klebsiella pneumoniae NOT DETECTED NOT DETECTED Final   Proteus species NOT DETECTED NOT DETECTED Final   Serratia marcescens NOT DETECTED NOT DETECTED Final   Haemophilus influenzae NOT DETECTED NOT DETECTED Final   Neisseria meningitidis NOT DETECTED NOT DETECTED Final   Pseudomonas aeruginosa NOT DETECTED NOT DETECTED Final   Candida albicans NOT DETECTED NOT DETECTED Final   Candida glabrata NOT DETECTED NOT DETECTED Final   Candida krusei NOT DETECTED NOT DETECTED Final   Candida  parapsilosis NOT  DETECTED NOT DETECTED Final   Candida tropicalis NOT DETECTED NOT DETECTED Final    Comment: Performed at [facility], [address] [place], [place] [address]  CULTURE, BLOOD (ROUTINE X 2) w Reflex to ID Panel     Status: None (Preliminary result)   Collection Time: 11/16/18 12:05 AM   Specimen: BLOOD  Result Value Ref Range Status   Specimen Description BLOOD LEFT ASSIST CONTROL  Final   Special Requests   Final    BOTTLES DRAWN AEROBIC AND ANAEROBIC Blood Culture results may not be optimal due to an excessive volume of blood received in culture bottles   Culture   Final    NO GROWTH < 12 HOURS Performed at [facility], [address] [place], [place] [address]    Report Status PENDING  Incomplete  CULTURE, BLOOD (ROUTINE X 2) w Reflex to ID Panel     Status: None (Preliminary result)   Collection Time: 11/16/18 12:11 AM   Specimen: BLOOD  Result Value Ref Range Status   Specimen Description BLOOD LEFT HAND  Final   Special Requests   Final    BOTTLES DRAWN AEROBIC AND ANAEROBIC Blood Culture adequate volume   Culture   Final    NO GROWTH < 12 HOURS Performed at [facility], [address] [place],  [address]    Report Status PENDING  Incomplete    Coagulation Studies: No results for input(s): LABPROT, INR in the last 72 hours.  Urinalysis: No results for input(s): COLORURINE, LABSPEC, PHURINE, GLUCOSEU, HGBUR, BILIRUBINUR, KETONESUR, PROTEINUR, UROBILINOGEN, NITRITE, LEUKOCYTESUR in the last 72 hours.  Invalid input(s): APPERANCEUR    Imaging: No results found.   Medications:   .  sodium bicarbonate (isotonic) infusion in sterile water 75 mL/hr at 11/16/18 0351   . aspirin EC  81 mg Oral Daily  . Chlorhexidine Gluconate Cloth  6 each Topical Q0600  . digoxin  0.25 mg Oral Daily  . docusate sodium  100 mg Oral BID  . finasteride  5 mg Oral Daily  . folic acid  1 mg Oral Daily  . gabapentin  300 mg Oral BID  .  loratadine  10 mg Oral Daily  . LORazepam  0.5 mg Oral Daily  . metoprolol succinate  12.5 mg Oral Daily  . mupirocin ointment  1 application Nasal BID  . vancomycin variable dose per unstable renal function (pharmacist dosing)   Does not apply See admin instructions   acetaminophen **OR** acetaminophen, bisacodyl, hydrOXYzine, ondansetron **OR** ondansetron (ZOFRAN) IV, senna  Assessment/ Plan:  63 y.o. male with medical problems of ch sys CHF, CKD, A Fib, HTN who is a resident of group home is admitted to [facility] on 11/10/2018 for evaluation of weakness, weight loss. Had left femoral neck fracture in 10/2018  1. AKI, bilateral hydronephrosis 2. Hypercalcemia 3. Weight loss, cachexia 4. Anemia 5.  Bladder mass 6.  Metabolic acidosis.  Plan: Patient continues to have significant renal dysfunction.  Creatinine up to 3.25 with a BUN of 53 and EGFR 19.  No urgent indication for dialysis at the moment.  He will need additional urologic intervention which will hopefully be later this week.  Continue to monitor renal parameters till then.  No urgent indication for dialysis at the moment.  LOS: 6 Noretta Frier 8/11/20202:01 PM  [place], [place]  Note: This note was prepared with Dragon dictation. Any transcription errors are unintentional

## 2018-11-16 NOTE — Progress Notes (Signed)
*  PRELIMINARY RESULTS* Echocardiogram 2D Echocardiogram has been performed.  Ruben Clark 11/16/2018, 8:52 AM

## 2018-11-16 NOTE — Progress Notes (Signed)
[name] tentatively scheduled tomorrow for cystoscopy and TURBT; possible bilateral ureteral stent placement.  I called the number of his legal guardian Ruben Clark to obtain consent and left a voicemail.

## 2018-11-16 NOTE — Progress Notes (Signed)
[facility] [place] NAME: Ruben Clark    MR#:  [id number]  DATE OF BIRTH:  14-Apr-1955  SUBJECTIVE:  CHIEF COMPLAINT:   Chief Complaint  Patient presents with  . Weakness  Seen today Blood pressure on the softer side  poor appetite Generalized weakness No fever REVIEW OF SYSTEMS:  Review of Systems  Constitutional: Positive for malaise/fatigue and weight loss. Negative for diaphoresis and fever.  HENT: Negative for ear discharge, ear pain, hearing loss, nosebleeds, sore throat and tinnitus.   Eyes: Negative for blurred vision and pain.  Respiratory: Negative for cough, hemoptysis, shortness of breath and wheezing.   Cardiovascular: Negative for chest pain, palpitations, orthopnea and leg swelling.  Gastrointestinal: Negative for abdominal pain, blood in stool, constipation, diarrhea, heartburn, nausea and vomiting.  Genitourinary: Negative for dysuria, frequency and urgency.  Musculoskeletal: Negative for back pain and myalgias.  Skin: Negative for itching and rash.  Neurological: Negative for dizziness, tingling, tremors, focal weakness, seizures, weakness and headaches.  Psychiatric/Behavioral: Negative for depression. The patient is not nervous/anxious.    DRUG ALLERGIES:  No Known Allergies VITALS:  Blood pressure 91/66, pulse 73, temperature 98 F (36.7 C), temperature source Oral, resp. rate 18, height 5\' 10"  (1.778 m), weight 76.2 kg, SpO2 99 %. PHYSICAL EXAMINATION:  Physical Exam Constitutional:      Appearance: He is cachectic. He is ill-appearing.  HENT:     Head: Normocephalic and atraumatic.  Eyes:     Conjunctiva/sclera: Conjunctivae normal.     Pupils: Pupils are equal, round, and reactive to light.  Neck:     Musculoskeletal: Normal range of motion and neck supple.     Thyroid: No thyromegaly.     Trachea: No tracheal deviation.  Cardiovascular:     Rate and Rhythm: Normal rate and regular rhythm.     Heart sounds: Normal heart sounds.  Pulmonary:     Effort: Pulmonary effort is normal. No respiratory distress.     Breath sounds: Normal breath sounds. No wheezing.  Chest:     Chest wall: No tenderness.  Abdominal:     General: Bowel sounds are normal. There is no distension.     Palpations: Abdomen is soft.     Tenderness: There is no abdominal tenderness.  Musculoskeletal: Normal range of motion.  Skin:    General: Skin is warm and dry.     Findings: No rash.  Neurological:     Mental Status: He is alert and oriented to person, place, and time.     Cranial Nerves: No cranial nerve deficit.    LABORATORY PANEL:  Male CBC Recent Labs  Lab 11/16/18 0012  WBC 22.9*  HGB 9.6*  HCT 29.7*  PLT 404*   ------------------------------------------------------------------------------------------------------------------ Chemistries  Recent Labs  Lab 11/10/18 1840  11/16/18 0012  NA  --    < > 138  K  --    < > 3.6  CL  --    < > 107  CO2  --    < > 23  GLUCOSE  --    < > 98  BUN  --    < > 53*  CREATININE  --    < > 3.25*  CALCIUM  --    < > 11.7*  MG 2.4  --   --    < > = values in this interval not displayed.   RADIOLOGY:  No results  found. ASSESSMENT AND PLAN:  63 year old male patient with a known history of hip fracture, chronic congestive heart failure, CKD stage III, hypertension, chronic afib on eliquis was referred from group home.  Patient recently had hip fracture which was repaired and sent to rehab.  Day ago patient was sent from rehab group home.  He has generalized weakness.    * Acute hypercalcemia and significant unintentional weight loss Improving Current calcium is 11.7 Due to malignancy -Improved some with IV fluid hydration with normal saline Follow-up calcium levels  *Bladder mass High suspicion for urothelial malignancy metastaic -Has bilateral hydronephrosis -Appreciate urology evaluation -Plan for cystoscopy TURBT and pyelogram mid week  -Follow-up with urology -Appreciate nephrology follow-up -Follow-up with family -Appreciate oncology evaluation -Discussions ongoing regarding chemotherapy and immunotherapy with the patient and family -palliative care consult  *Sepsis Secondary to MRSA bacteremia IV vancomycin antibiotic on board WBC count improving Echocardiogram reviewed EF 55 to 60%  * Dehydration -Continue IV fluids  -Hypo-tension secondary to dehydration IV fluids and recheck blood pressure Hold metoprolol  -History of hip fracture status post ORIF Physical therapy to continue  -Acute on chronic kidney disease stage III -creatinine at 3.25 today.  Likely postobstructive with some prerenal component -Monitor while on IV fluids Avoid nephrotoxic meds -Nephrology input appreciated  -DVT prophylaxis On anticoagulation with Eliquis  -Chronic Afib Continue eliquis for anticoagulation  -Tobacco abuse Tobacco cessation counseled for six minutes by admitting doctor Nicotine patch offered, but patient did not want it.  -Covid 19 test negative  -Ambulatory dysfunction Physical therapy evaluation  Overall very poor prognosis. 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 Management plans discussed with the patient, nursing and they are in agreement.  CODE STATUS: Full Code  TOTAL TIME TAKING CARE OF THIS PATIENT: 34 minutes More than 50% of the time was spent in counseling/coordination of care: YES  POSSIBLE D/C IN 2-3 DAYS, DEPENDING ON CLINICAL CONDITION.   Saundra Shelling M.D on 11/16/2018 at 10:36 AM  Between 7am to 6pm - Pager - [phone]  After 6pm go to [email or web address] - Proofreader  [facility]  Office  [phone]  CC: Primary care physician; System, Pcp Not In  Note: This dictation was prepared with Dragon dictation along  with smaller phrase technology. Any transcriptional errors that result from this process are unintentional.

## 2018-11-17 ENCOUNTER — Inpatient Hospital Stay: Payer: Medicare HMO | Admitting: Anesthesiology

## 2018-11-17 ENCOUNTER — Encounter: Payer: Self-pay | Admitting: *Deleted

## 2018-11-17 ENCOUNTER — Inpatient Hospital Stay: Payer: Medicare HMO

## 2018-11-17 ENCOUNTER — Encounter
Admission: EM | Disposition: A | Discharge status: Skilled Nursing Facility | Payer: Self-pay | Source: Home / Self Care | Attending: Internal Medicine

## 2018-11-17 DIAGNOSIS — N131 Hydronephrosis with ureteral stricture, not elsewhere classified: Secondary | ICD-10-CM

## 2018-11-17 DIAGNOSIS — D72829 Elevated white blood cell count, unspecified: Secondary | ICD-10-CM

## 2018-11-17 DIAGNOSIS — D494 Neoplasm of unspecified behavior of bladder: Secondary | ICD-10-CM | POA: Diagnosis not present

## 2018-11-17 HISTORY — PX: CYSTOSCOPY W/ RETROGRADES: SHX1426

## 2018-11-17 HISTORY — PX: TRANSURETHRAL RESECTION OF BLADDER TUMOR: SHX2575

## 2018-11-17 LAB — CBC
HCT: 25.5 % — ABNORMAL LOW (ref 39.0–52.0)
Hemoglobin: 8.3 g/dL — ABNORMAL LOW (ref 13.0–17.0)
MCH: 28.7 pg (ref 26.0–34.0)
MCHC: 32.5 g/dL (ref 30.0–36.0)
MCV: 88.2 fL (ref 80.0–100.0)
Platelets: 329 10*3/uL (ref 150–400)
RBC: 2.89 MIL/uL — ABNORMAL LOW (ref 4.22–5.81)
RDW: 15.4 % (ref 11.5–15.5)
WBC: 23.1 10*3/uL — ABNORMAL HIGH (ref 4.0–10.5)
nRBC: 0 % (ref 0.0–0.2)

## 2018-11-17 LAB — BASIC METABOLIC PANEL
Anion gap: 10 (ref 5–15)
BUN: 57 mg/dL — ABNORMAL HIGH (ref 8–23)
CO2: 24 mmol/L (ref 22–32)
Calcium: 11.1 mg/dL — ABNORMAL HIGH (ref 8.9–10.3)
Chloride: 103 mmol/L (ref 98–111)
Creatinine, Ser: 3.83 mg/dL — ABNORMAL HIGH (ref 0.61–1.24)
GFR calc Af Amer: 18 mL/min — ABNORMAL LOW (ref >60)
GFR calc non Af Amer: 16 mL/min — ABNORMAL LOW (ref >60)
Glucose, Bld: 70 mg/dL (ref 70–99)
Potassium: 3.6 mmol/L (ref 3.5–5.1)
Sodium: 137 mmol/L (ref 135–145)

## 2018-11-17 LAB — CULTURE, BLOOD (ROUTINE X 2)
Special Requests: ADEQUATE
Special Requests: ADEQUATE

## 2018-11-17 LAB — URINE CULTURE: Culture: >=100000 — AB

## 2018-11-17 SURGERY — TURBT (TRANSURETHRAL RESECTION OF BLADDER TUMOR)
Anesthesia: General | Site: Ureter | Laterality: Right

## 2018-11-17 MED ORDER — DEXAMETHASONE SODIUM PHOSPHATE 10 MG/ML IJ SOLN
INTRAMUSCULAR | Status: AC
  Filled 2018-11-17: qty 1

## 2018-11-17 MED ORDER — PROPOFOL 10 MG/ML IV BOLUS
INTRAVENOUS | Status: DC | PRN
  Administered 2018-11-17: 100 mg via INTRAVENOUS

## 2018-11-17 MED ORDER — SODIUM CHLORIDE 0.9 % IV SOLN
INTRAVENOUS | Status: DC | PRN
  Administered 2018-11-17 (×2): via INTRAVENOUS

## 2018-11-17 MED ORDER — SODIUM CHLORIDE 0.9 % IV SOLN
2.0000 g | INTRAVENOUS | Status: DC
  Administered 2018-11-17 – 2018-11-18 (×2): 2 g via INTRAVENOUS
  Filled 2018-11-17 (×2): qty 2

## 2018-11-17 MED ORDER — IOPAMIDOL (ISOVUE-200) INJECTION 41%
INTRAVENOUS | Status: DC | PRN
  Administered 2018-11-17: 80 mL

## 2018-11-17 MED ORDER — VASOPRESSIN 20 UNIT/ML IV SOLN
INTRAVENOUS | Status: DC | PRN
  Administered 2018-11-17: 2 [IU] via INTRAVENOUS
  Administered 2018-11-17: 1 [IU] via INTRAVENOUS
  Administered 2018-11-17: 2 [IU] via INTRAVENOUS
  Administered 2018-11-17: 1 [IU] via INTRAVENOUS
  Administered 2018-11-17: 2 [IU] via INTRAVENOUS

## 2018-11-17 MED ORDER — FENTANYL CITRATE (PF) 100 MCG/2ML IJ SOLN
INTRAMUSCULAR | Status: AC
  Filled 2018-11-17: qty 2

## 2018-11-17 MED ORDER — PROPOFOL 10 MG/ML IV BOLUS
INTRAVENOUS | Status: AC
  Filled 2018-11-17: qty 20

## 2018-11-17 MED ORDER — ONDANSETRON HCL 4 MG/2ML IJ SOLN
INTRAMUSCULAR | Status: DC | PRN
  Administered 2018-11-17: 4 mg via INTRAVENOUS

## 2018-11-17 MED ORDER — SODIUM CHLORIDE 0.9 % IV BOLUS
500.0000 mL | Freq: Once | INTRAVENOUS | Status: AC
  Administered 2018-11-17: 500 mL via INTRAVENOUS

## 2018-11-17 MED ORDER — FENTANYL CITRATE (PF) 100 MCG/2ML IJ SOLN
INTRAMUSCULAR | Status: DC | PRN
  Administered 2018-11-17 (×3): 25 ug via INTRAVENOUS

## 2018-11-17 MED ORDER — SODIUM CHLORIDE 0.9 % IV SOLN
INTRAVENOUS | Status: DC | PRN
  Administered 2018-11-17: 40 ug/min via INTRAVENOUS

## 2018-11-17 MED ORDER — FENTANYL CITRATE (PF) 100 MCG/2ML IJ SOLN: 25.0000 ug | INTRAMUSCULAR | Status: DC | PRN

## 2018-11-17 MED ORDER — LIDOCAINE HCL (CARDIAC) PF 100 MG/5ML IV SOSY
PREFILLED_SYRINGE | INTRAVENOUS | Status: DC | PRN
  Administered 2018-11-17: 50 mg via INTRAVENOUS
  Administered 2018-11-17: 30 mg via INTRAVENOUS

## 2018-11-17 MED ORDER — ONDANSETRON HCL 4 MG/2ML IJ SOLN: 4.0000 mg | Freq: Once | INTRAMUSCULAR | Status: DC | PRN

## 2018-11-17 MED ORDER — VASOPRESSIN 20 UNIT/ML IV SOLN
INTRAVENOUS | Status: AC
  Filled 2018-11-17: qty 1

## 2018-11-17 MED ORDER — ONDANSETRON HCL 4 MG/2ML IJ SOLN
INTRAMUSCULAR | Status: AC
  Filled 2018-11-17: qty 2

## 2018-11-17 MED ORDER — PHENYLEPHRINE HCL (PRESSORS) 10 MG/ML IV SOLN
INTRAVENOUS | Status: DC | PRN
  Administered 2018-11-17 (×2): 200 ug via INTRAVENOUS
  Administered 2018-11-17: 100 ug via INTRAVENOUS

## 2018-11-17 MED ORDER — SODIUM CHLORIDE 0.9 % IV SOLN
INTRAVENOUS | Status: DC
  Administered 2018-11-17: 999 mL/h via INTRAVENOUS

## 2018-11-17 MED ORDER — DEXAMETHASONE SODIUM PHOSPHATE 10 MG/ML IJ SOLN
INTRAMUSCULAR | Status: DC | PRN
  Administered 2018-11-17: 10 mg via INTRAVENOUS

## 2018-11-17 SURGICAL SUPPLY — 37 items
BAG DRAIN CYSTO-URO LG1000N (MISCELLANEOUS) ×5 IMPLANT
BAG URINE DRAINAGE (UROLOGICAL SUPPLIES) ×5 IMPLANT
BRUSH SCRUB EZ  4% CHG (MISCELLANEOUS) ×2
BRUSH SCRUB EZ 4% CHG (MISCELLANEOUS) ×3 IMPLANT
CATH FOL 3WAY LX COUV 24X75 (CATHETERS) ×3 IMPLANT
CATH FOLEY 2WAY  5CC 16FR (CATHETERS)
CATH URETL 5X70 OPEN END (CATHETERS) ×5 IMPLANT
CATH URTH 16FR FL 2W BLN LF (CATHETERS) IMPLANT
DRAPE UTILITY 15X26 TOWEL STRL (DRAPES) ×5 IMPLANT
DRSG TELFA 4X3 1S NADH ST (GAUZE/BANDAGES/DRESSINGS) ×5 IMPLANT
ELECT LOOP 22F BIPOLAR SML (ELECTROSURGICAL)
ELECT REM PT RETURN 9FT ADLT (ELECTROSURGICAL)
ELECTRODE LOOP 22F BIPOLAR SML (ELECTROSURGICAL) IMPLANT
ELECTRODE REM PT RTRN 9FT ADLT (ELECTROSURGICAL) IMPLANT
GLIDEWIRE STIFF .35X180X3 HYDR (WIRE) ×3 IMPLANT
GLIDEWIRE STR 0.035 150CM 3CM (WIRE) ×3 IMPLANT
GLOVE BIO SURGEON STRL SZ8 (GLOVE) ×5 IMPLANT
GOWN STRL REUS W/ TWL LRG LVL3 (GOWN DISPOSABLE) ×3 IMPLANT
GOWN STRL REUS W/ TWL XL LVL3 (GOWN DISPOSABLE) ×3 IMPLANT
GOWN STRL REUS W/TWL LRG LVL3 (GOWN DISPOSABLE) ×2
GOWN STRL REUS W/TWL XL LVL3 (GOWN DISPOSABLE) ×7 IMPLANT
GOWN STRL REUS W/TWL XL LVL4 (GOWN DISPOSABLE) ×5 IMPLANT
GUIDEWIRE STR DUAL SENSOR (WIRE) ×8 IMPLANT
KIT TURNOVER CYSTO (KITS) ×5 IMPLANT
LOOP CUT BIPOLAR 24F LRG (ELECTROSURGICAL) ×3 IMPLANT
PACK CYSTO AR (MISCELLANEOUS) ×5 IMPLANT
PLUG CATH AND CAP STER (CATHETERS) ×3 IMPLANT
SET CYSTO W/LG BORE CLAMP LF (SET/KITS/TRAYS/PACK) ×5 IMPLANT
SET IRRIG Y TYPE TUR BLADDER L (SET/KITS/TRAYS/PACK) ×5 IMPLANT
SOL .9 NS 3000ML IRR  AL (IV SOLUTION) ×8
SOL .9 NS 3000ML IRR UROMATIC (IV SOLUTION) ×6 IMPLANT
STENT URET 6FRX24 CONTOUR (STENTS) IMPLANT
STENT URET 6FRX26 CONTOUR (STENTS) IMPLANT
SURGILUBE 2OZ TUBE FLIPTOP (MISCELLANEOUS) ×5 IMPLANT
SYR 30ML LL (SYRINGE) ×3 IMPLANT
SYRINGE IRR TOOMEY STRL 70CC (SYRINGE) ×5 IMPLANT
WATER STERILE IRR 1000ML POUR (IV SOLUTION) ×5 IMPLANT

## 2018-11-17 NOTE — Progress Notes (Signed)
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  He is scheduled today for cystoscopy under anesthesia, TURBT and bilateral retrograde pyelograms.  Multiple attempts have been made to obtain consent from his legal guardian.  Voicemails have been left and he has not made contact.  I deem this procedure necessary and urgent as he does have bilateral ureteral obstruction and acute on chronic renal failure.  I have discussed with the hospitalist, Dr. Estanislado Pandy who is in agreement.  We will proceed with the planned procedure as scheduled.

## 2018-11-17 NOTE — Progress Notes (Signed)
Due to pts issues with low BP. Dr. Estanislado Pandy wants to hold pts daily metoprolol dose for Thursday 8/13 and Friday 8/14. This RN will pass this off in bedside report.

## 2018-11-17 NOTE — Transfer of Care (Signed)
Immediate Anesthesia Transfer of Care Note  Patient: Ruben Clark  Procedure(s) Performed: TRANSURETHRAL RESECTION OF BLADDER TUMOR (TURBT) (N/A Bladder) CYSTOSCOPY WITH RETROGRADE PYELOGRAM (Right Ureter)  Patient Location: PACU  Anesthesia Type:General  Level of Consciousness: drowsy and patient cooperative  Airway & Oxygen Therapy: Patient Spontanous Breathing and Patient connected to face mask oxygen  Post-op Assessment: Report given to RN and Post -op Vital signs reviewed and stable  Post vital signs: Reviewed and stable  Last Vitals:  Vitals Value Taken Time  BP 115/68 11/17/18 1341  Temp 36.2 C 11/17/18 1341  Pulse 72 11/17/18 1345  Resp 20 11/17/18 1345  SpO2 100 % 11/17/18 1345  Vitals shown include unvalidated device data.  Last Pain:  Vitals:   11/17/18 1057  TempSrc: Oral  PainSc: 0-No pain         Complications: No apparent anesthesia complications

## 2018-11-17 NOTE — Anesthesia Post-op Follow-up Note (Signed)
Anesthesia QCDR form completed.        

## 2018-11-17 NOTE — Care Management Important Message (Deleted)
Important Message  Patient Details  Name: Ruben Clark MRN: [id number] Date of Birth: 1955-04-22   Medicare Important Message Given:  Yes     Ross Ludwig, LCSW 11/17/2018, 11:51 AM

## 2018-11-17 NOTE — Progress Notes (Signed)
[facility] Hematology/Oncology Progress Note  Date of admission: 11/10/2018  Hospital day:  11/17/2018  Spoke to the patient's guardian, Ruben Clark [phone]) today.  Reviewed all available data.  Imaging suggests metastatic urothelial carcinoma. Disease is not curable.  Treatment is palliative. Discuss that patient undergoing surgery today. Discuss declining renal function secondary to bilateral ureteral obstruction from the bladder tumor. Discuss need for bilateral stent placement. Discuss patient's desire NOT to have chemotherapy.  He is considering immunotherapy.  He is aware of the consequences of his decision.  Discuss patient's co-morbidities and performance status. Ruben Clark noted that the patient is a ward of the state. He has been deemed incompetent to make decisions himself. He CAN make the decision not to have chemotherapy.  Discuss reconvening once pathology is available. Numerous questions asked and answered.   [name], MD  11/17/2018, 9:34 PM

## 2018-11-17 NOTE — Op Note (Signed)
Preoperative diagnosis: 1. Bladder tumor (> 5cm)  Postoperative diagnosis:  1. Bladder tumor (> 5cm)  Procedure:  1. Cystoscopy 2. Transurethral resection of bladder tumor   3. Right retrograde pyelography with interpretation  Surgeon: Nicki Reaper C. Bernard Donahoo, M.D.  Anesthesia: General  Complications: None  Intraoperative findings:  1. Bladder tumor: Extensive keratinized bladder tumor involving lateral walls, posterior wall, dome. 2. Retrograde pyelography: Right retrograde pyelogram with severe hydronephrosis/hydroureter.  Right ureter tortuous.  EBL: Minimal  Specimens: 1. Bladder tumor      Indication: Ruben Clark is a patient who was found to have an extensive bladder tumor on CT with severe bilateral hydronephrosis present since at least October 2019.  CT suspicious for liver metastasis.  He is a ward of the state and not competent to make his own decisions.  Numerous calls were made trying to reach his legal guardian however were not successful.  Myself and Dr. Estanislado Pandy deemed the procedure necessary and urgent.  Description of procedure:  The patient was taken to the operating room and general anesthesia was induced.  The patient was placed in the dorsal lithotomy position and his Foley catheter was removed.  He was prepped and draped in the usual sterile fashion, and had received antibiotics on the floor.  A preoperative time-out was performed.    Cystourethroscopy was performed.  The patient's urethra was examined and was normal/ demonstrated mild prostatic enlargement.  Although he had an indwelling Foley catheter a large amount of purulent, foul-smelling urine was obtained on introduction of the cystoscope.  Manual irrigation was required to clear the bladder of a large amount of whitish debris.  The bladder was then systematically examined in its entirety.  Extensive tumor with apparent marked keratinization was found to involve the entire lateral walls, posterior wall and  dome of the bladder.  The trigone was spared.  The ureteral orifices were identified.  A 26 French continuous-flow resectoscope sheath with visual obturator was lubricated and passed per urethra.   A large amount of the tumor was resected concentrated all areas that appeared to involve tumor and not just keratinized areas.  Hemostasis was obtained with cautery.  Attention then turned to the right ureteral orifice and a 0.038 sensor wire was placed through the laser bridge into the right ureter and advanced to the distal ureter.  A 5 French open-ended catheter was then placed over the wire and the guidewire was removed.  Omnipaque contrast was injected through the ureteral catheter and a retrograde pyelogram was performed with findings as dictated above.  The dilated ureter was quite tortuous and in the area of the proximal ureter made a "hairpin turn" and a guidewire could not be negotiated to the renal pelvis.  A stent could therefore not be placed.  Attention then turned to the left ureteral orifice and a guidewire was unable to be placed into the ureter with the aid of several different wires including an angled Glidewire.   The resectoscope was removed.  A 20 French three-way Couvellaire hematuria catheter was placed and irrigated with return of light rose effluent.  The CBI inflow was plugged.  After anesthetic reversal he was transported to the PACU in stable condition.  Due to inability to place ureteral stents will need to address placement percutaneous nephrostomies by IR.   Yamilette Garretson C. Bernardo Heater,  MD

## 2018-11-17 NOTE — Interval H&P Note (Signed)
History and Physical Interval Note:  11/17/2018 11:23 AM  Walker Shadow  has presented today for surgery, with the diagnosis of N/A.  The various methods of treatment have been discussed with the patient and family. After consideration of risks, benefits and other options for treatment, the patient has consented to  Procedure(s): TRANSURETHRAL RESECTION OF BLADDER TUMOR (TURBT) (N/A) CYSTOSCOPY WITH STENT PLACEMENT-POSSIBLE (Bilateral) CYSTOSCOPY WITH RETROGRADE PYELOGRAM (Bilateral) as a surgical intervention.  The patient's history has been reviewed, patient examined, no change in status, stable for surgery.  I have reviewed the patient's chart and labs.  Questions were answered to the patient's satisfaction.     [place]

## 2018-11-17 NOTE — Anesthesia Preprocedure Evaluation (Signed)
Anesthesia Evaluation  Patient identified by MRN, date of birth, ID band Patient awake    Reviewed: Allergy & Precautions, H&P , NPO status , Patient's Chart, lab work & pertinent test results, reviewed documented beta blocker date and time   Airway Mallampati: II  TM Distance: >3 FB Neck ROM: full    Dental  (+) Teeth Intact   Pulmonary COPD, Current Smoker,    Pulmonary exam normal        Cardiovascular Exercise Tolerance: Good hypertension, +CHF  Normal cardiovascular exam Rate:Normal     Neuro/Psych negative neurological ROS  negative psych ROS   GI/Hepatic negative GI ROS, Neg liver ROS,   Endo/Other  negative endocrine ROS  Renal/GU Renal disease  negative genitourinary   Musculoskeletal   Abdominal   Peds  Hematology negative hematology ROS (+)   Anesthesia Other Findings   Reproductive/Obstetrics negative OB ROS                             Anesthesia Physical Anesthesia Plan  ASA: II  Anesthesia Plan: General LMA   Post-op Pain Management:    Induction:   PONV Risk Score and Plan:   Airway Management Planned:   Additional Equipment:   Intra-op Plan:   Post-operative Plan:   Informed Consent: I have reviewed the patients History and Physical, chart, labs and discussed the procedure including the risks, benefits and alternatives for the proposed anesthesia with the patient or authorized representative who has indicated his/her understanding and acceptance.       Plan Discussed with: CRNA  Anesthesia Plan Comments:         Anesthesia Quick Evaluation

## 2018-11-17 NOTE — Progress Notes (Signed)
PT Cancellation Note  Patient Details Name: Ruben Clark MRN: [id number] DOB: 09/03/1955   Cancelled Treatment:    Reason Eval/Treat Not Completed: Patient at procedure or test/unavailable. Pt out of room for surgical procedure. Will need new or continuation orders to continue PT.    Larae Grooms, PTA 11/17/2018, 2:33 PM

## 2018-11-17 NOTE — Progress Notes (Signed)
[facility] [place] NAME: Ruben Clark    MR#:  [id number]  DATE OF BIRTH:  [birth year]  SUBJECTIVE:  CHIEF COMPLAINT:   Chief Complaint  Patient presents with  . Weakness  Seen today Awake and responds to verbal commands Not completely oriented Poor appetite Generalized weakness No fever REVIEW OF SYSTEMS:  Review of Systems  Constitutional: Positive for malaise/fatigue and weight loss. Negative for diaphoresis and fever.  HENT: Negative for ear discharge, ear pain, hearing loss, nosebleeds, sore throat and tinnitus.   Eyes: Negative for blurred vision and pain.  Respiratory: Negative for cough, hemoptysis, shortness of breath and wheezing.   Cardiovascular: Negative for chest pain, palpitations, orthopnea and leg swelling.  Gastrointestinal: Negative for abdominal pain, blood in stool, constipation, diarrhea, heartburn, nausea and vomiting.  Genitourinary: Negative for dysuria, frequency and urgency.  Musculoskeletal: Negative for back pain and myalgias.  Skin: Negative for itching and rash.  Neurological: Negative for dizziness, tingling, tremors, focal weakness, seizures, weakness and headaches.  Psychiatric/Behavioral: Negative for depression. The patient is not nervous/anxious.    DRUG ALLERGIES:  No Known Allergies VITALS:  Blood pressure 107/70, pulse 98, temperature 98 F (36.7 C), temperature source Oral, resp. rate 18, height 5\' 10"  (1.778 m), weight 77.2 kg, SpO2 98 %. PHYSICAL EXAMINATION:  Physical Exam Constitutional:      Appearance: He is cachectic. He is ill-appearing.  HENT:     Head: Normocephalic and atraumatic.  Eyes:     Conjunctiva/sclera: Conjunctivae normal.     Pupils: Pupils are equal, round, and reactive to light.  Neck:     Musculoskeletal: Normal range of motion and neck supple.     Thyroid: No thyromegaly.     Trachea: No tracheal deviation.  Cardiovascular:     Rate and Rhythm:  Normal rate and regular rhythm.     Heart sounds: Normal heart sounds.  Pulmonary:     Effort: Pulmonary effort is normal. No respiratory distress.     Breath sounds: Normal breath sounds. No wheezing.  Chest:     Chest wall: No tenderness.  Abdominal:     General: Bowel sounds are normal. There is no distension.     Palpations: Abdomen is soft.     Tenderness: There is no abdominal tenderness.  Musculoskeletal: Normal range of motion.  Skin:    General: Skin is warm and dry.     Findings: No rash.  Neurological:     Mental Status: He is alert and oriented to person, place, and time.     Cranial Nerves: No cranial nerve deficit.    LABORATORY PANEL:  Male CBC Recent Labs  Lab 11/17/18 0639  WBC 23.1*  HGB 8.3*  HCT 25.5*  PLT 329   ------------------------------------------------------------------------------------------------------------------ Chemistries  Recent Labs  Lab 11/10/18 1840  11/17/18 0639  NA  --    < > 137  K  --    < > 3.6  CL  --    < > 103  CO2  --    < > 24  GLUCOSE  --    < > 70  BUN  --    < > 57*  CREATININE  --    < > 3.83*  CALCIUM  --    < > 11.1*  MG 2.4  --   --    < > = values in this interval not displayed.   RADIOLOGY:  No results found. ASSESSMENT AND PLAN:  63 year old male patient with a known history of hip fracture, chronic congestive heart failure, CKD stage III, hypertension, chronic afib on eliquis was referred from group home.  Patient recently had hip fracture which was repaired and sent to rehab.  Day ago patient was sent from rehab group home.  He has generalized weakness.    * Acute hypercalcemia and significant unintentional weight loss Improving Current calcium is 11.7 Due to malignancy -Improved some with IV fluid hydration with normal saline Follow-up calcium levels  *Bladder mass High suspicion for urothelial malignancy metastaic -Has bilateral hydronephrosis -Appreciate urology evaluation -Cystoscopy,  TURBT and stent placement today -Follow-up with urology -Appreciate nephrology follow-up -Follow-up with family -Appreciate oncology evaluation -Discussions ongoing regarding chemotherapy and immunotherapy with the patient and family -palliative care consult on board  *Sepsis Secondary to MRSA bacteremia IV vancomycin antibiotic on board Will add IV cefepime antibiotic to for broad coverage WBC count improving Echocardiogram reviewed EF 55 to 60%  * Dehydration -Continue IV fluids  -Hypo-tension secondary to dehydration IV fluids and recheck blood pressure Hold metoprolol  -History of hip fracture status post ORIF Physical therapy to continue  -Acute on chronic kidney disease stage III -creatinine at 3.25 today.  Likely postobstructive with some prerenal component -Monitor while on IV fluids Avoid nephrotoxic meds -Nephrology input appreciated  -DVT prophylaxis On anticoagulation with Eliquis  -Chronic Afib Continue eliquis for anticoagulation  -Tobacco abuse Tobacco cessation counseled for six minutes by admitting doctor Nicotine patch offered, but patient did not want it.  -Covid 19 test negative  -Ambulatory dysfunction Physical therapy evaluation  Overall very poor prognosis. 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 Management plans discussed with the patient, nursing and they are in agreement.  CODE STATUS: Full Code  TOTAL TIME TAKING CARE OF THIS PATIENT: 36 minutes More than 50% of the time was spent in counseling/coordination of care: YES  POSSIBLE D/C IN 2-3 DAYS, DEPENDING ON CLINICAL CONDITION.   Saundra Shelling M.D on 11/17/2018 at 12:35 PM  Between 7am to 6pm - Pager - [phone]  After 6pm go to [email or web address] - Proofreader  [facility]  Office  [phone]  CC: Primary care  physician; System, Pcp Not In  Note: This dictation was prepared with Dragon dictation along with smaller phrase technology. Any transcriptional errors that result from this process are unintentional.

## 2018-11-17 NOTE — Progress Notes (Signed)
ID  Pt had TURBT today Still under the  influence of sedation Patient Vitals for the past 24 hrs:  BP Temp Temp src Pulse Resp SpO2 Height Weight  11/17/18 1712 90/63 - - 78 - - - -  11/17/18 1553 (!) 83/55 - - 61 - 100 % - -  11/17/18 1504 118/80 - - 78 14 99 % - -  11/17/18 1455 (!) 89/45 - - 72 16 98 % - -  11/17/18 1449 (!) 80/45 - - 74 17 100 % - -  11/17/18 1438 (!) 95/56 97.7 F (36.5 C) - 77 (!) 21 99 % - -  11/17/18 1433 (!) 80/56 - - 83 18 98 % - -  11/17/18 1426 (!) 77/56 - - 80 16 100 % - -  11/17/18 1417 (!) 79/58 - - 79 10 95 % - -  11/17/18 1409 (!) 82/57 - - 85 14 99 % - -  11/17/18 1403 - - - 78 18 97 % - -  11/17/18 1357 (!) 91/57 - - 76 (!) 25 95 % - -  11/17/18 1341 115/68 (!) 97.2 F (36.2 C) - 81 12 100 % - -  11/17/18 1057 107/70 98 F (36.7 C) Oral 98 18 98 % 5\' 10"  (1.778 m) 77.2 kg  11/17/18 0808 109/63 98 F (36.7 C) Oral 74 - 100 % - -  11/17/18 0559 98/64 (!) 97.4 F (36.3 C) Oral 91 18 97 % - -  11/17/18 0500 - - - - - - - 77.2 kg    CBC Latest Ref Rng & Units 11/17/2018 11/16/2018 11/15/2018  WBC 4.0 - 10.5 K/uL 23.1(H) 22.9(H) 21.5(H)  Hemoglobin 13.0 - 17.0 g/dL 8.3(L) 9.6(L) 9.3(L)  Hematocrit 39.0 - 52.0 % 25.5(L) 29.7(L) 30.3(L)  Platelets 150 - 400 K/uL 329 404(H) 383     Impression/recommendation  MRSA bacteremia On vancomycin 2 d echo does not show vegetation- needs TEE Repeat blood culture has been sent  Extensive bladder tumor- causing B/l Obsructive uropathy causing hydroureteronephrosis. Underwent TURBT.  Cefepime was started today because his blood pressure was low.  Will monitor him on it and see whether we can discontinue it tomorrow..  AKI on CKD due to the obstruction  Leucocytosis- due to the tumor burden and obstruction May need nephrostomy  Discussed the management with Dr. Estanislado Pandy.

## 2018-11-17 NOTE — Care Management Important Message (Signed)
Important Message  Patient Details  Name: Ruben Clark MRN: [id number] Date of Birth: 11-15-1955   Medicare Important Message Given:  Yes     Ross Ludwig, LCSW 11/17/2018, 11:52 AM

## 2018-11-17 NOTE — Progress Notes (Signed)
[facility], [place] 11/17/18  Subjective:  Patient due for TURBT today. Renal function worsening. Patient intermittently confused.   Objective:  Vital signs in last 24 hours:  Temp:  [97.2 F (36.2 C)-98.3 F (36.8 C)] 97.7 F (36.5 C) (08/12 1438) Pulse Rate:  [72-98] 78 (08/12 1504) Resp:  [10-25] 14 (08/12 1504) BP: (77-118)/(45-80) 118/80 (08/12 1504) SpO2:  [95 %-100 %] 99 % (08/12 1504) Weight:  [77.2 kg] 77.2 kg (08/12 1057)  Weight change: 4.402 kg Filed Weights   11/16/18 0352 11/17/18 0500 11/17/18 1057  Weight: 76.2 kg 77.2 kg 77.2 kg    Intake/Output:    Intake/Output Summary (Last 24 hours) at 11/17/2018 1539 Last data filed at 11/17/2018 1444 Gross per 24 hour  Intake 3674.98 ml  Output 1850 ml  Net 1824.98 ml     Physical Exam: General:  Thin, cachectic gentleman, laying in the bed  HEENT  anicteric, moist oral mucous membranes  Neck  supple  Pulm/lungs  clear to auscultation, normal effort  CVS/Heart  no rub, S1-S2  Abdomen:   Fullness, firmness over lower abdomen  Extremities:  No edema  Neurologic:  Alert, but confused  Skin:  Dry skin    Basic Metabolic Panel:  Recent Labs  Lab 11/10/18 1840  11/11/18 1500  11/13/18 0534 11/14/18 0540 11/15/18 0455 11/16/18 0012 11/17/18 0639  NA  --    < >  --    < > 141 141 142 138 137  K  --    < >  --    < > 4.2 4.1 3.6 3.6 3.6  CL  --    < >  --    < > 117* 119* 115* 107 103  CO2  --    < >  --    < > 20* 15* 20* 23 24  GLUCOSE  --    < >  --    < > 93 76 96 98 70  BUN  --    < >  --    < > 45* 51* 49* 53* 57*  CREATININE  --    < >  --    < > 2.28* 3.06* 3.00* 3.25* 3.83*  CALCIUM  --    < >  --    < > 12.4* 12.1* 12.0* 11.7* 11.1*  MG 2.4  --   --   --   --   --   --   --   --   PHOS  --   --  3.5  --   --   --   --   --   --    < > = values in this interval not displayed.     CBC: Recent Labs  Lab 11/10/18 1631  11/13/18 0534 11/14/18 0540  11/15/18 0455 11/16/18 0012 11/17/18 0639  WBC 13.6*   < > 14.5* 24.4* 21.5* 22.9* 23.1*  NEUTROABS 11.0*  --   --   --   --   --   --   HGB 10.4*   < > 9.6* 9.6* 9.3* 9.6* 8.3*  HCT 32.8*   < > 31.3* 31.2* 30.3* 29.7* 25.5*  MCV 91.6   < > 93.2 94.0 93.8 89.5 88.2  PLT 519*   < > 428* 413* 383 404* 329   < > = values in this interval not displayed.      Lab Results  Component Value Date   HEPBSAG Negative 11/11/2018  HEPBSAB Non Reactive 11/11/2018      Microbiology:  Recent Results (from the past 240 hour(s))  SARS Coronavirus 2 [facility] order, Performed in [facility] hospital lab) Nasopharyngeal Nasopharyngeal Swab     Status: None   Collection Time: 11/10/18  6:40 PM   Specimen: Nasopharyngeal Swab  Result Value Ref Range Status   SARS Coronavirus 2 NEGATIVE NEGATIVE Final    Comment: (NOTE) If result is NEGATIVE SARS-CoV-2 target nucleic acids are NOT DETECTED. The SARS-CoV-2 RNA is generally detectable in upper and lower  respiratory specimens during the acute phase of infection. The lowest  concentration of SARS-CoV-2 viral copies this assay can detect is 250  copies / mL. A negative result does not preclude SARS-CoV-2 infection  and should not be used as the sole basis for treatment or other  patient management decisions.  A negative result may occur with  improper specimen collection / handling, submission of specimen other  than nasopharyngeal swab, presence of viral mutation(s) within the  areas targeted by this assay, and inadequate number of viral copies  (<250 copies / mL). A negative result must be combined with clinical  observations, patient history, and epidemiological information. If result is POSITIVE SARS-CoV-2 target nucleic acids are DETECTED. The SARS-CoV-2 RNA is generally detectable in upper and lower  respiratory specimens dur ing the acute phase of infection.  Positive  results are indicative of active infection with SARS-CoV-2.   Clinical  correlation with patient history and other diagnostic information is  necessary to determine patient infection status.  Positive results do  not rule out bacterial infection or co-infection with other viruses. If result is PRESUMPTIVE POSTIVE SARS-CoV-2 nucleic acids MAY BE PRESENT.   A presumptive positive result was obtained on the submitted specimen  and confirmed on repeat testing.  While 2019 novel coronavirus  (SARS-CoV-2) nucleic acids may be present in the submitted sample  additional confirmatory testing may be necessary for epidemiological  and / or clinical management purposes  to differentiate between  SARS-CoV-2 and other Sarbecovirus currently known to infect humans.  If clinically indicated additional testing with an alternate test  methodology [phone]) is advised. The SARS-CoV-2 RNA is generally  detectable in upper and lower respiratory sp ecimens during the acute  phase of infection. The expected result is Negative. Fact Sheet for Patients:  StrictlyIdeas.no Fact Sheet for Healthcare Providers: BankingDealers.co.za This test is not yet approved or cleared by the [place] FDA and has been authorized for detection and/or diagnosis of SARS-CoV-2 by FDA under an Emergency Use Authorization (EUA).  This EUA will remain in effect (meaning this test can be used) for the duration of the COVID-19 declaration under Section 564(b)(1) of the Act, 21 U.S.C. section 360bbb-3(b)(1), unless the authorization is terminated or revoked sooner. Performed at [facility], [address] [place], [place] [address]   Urine Culture     Status: Abnormal   Collection Time: 11/13/18 11:21 PM   Specimen: Urine, Random  Result Value Ref Range Status   Specimen Description   Final    URINE, RANDOM Performed at [facility], [address] [place], [place] [address]    Special Requests   Final     NONE Performed at [facility], [address] [place], [place] [address]    Culture (A)  Final    >=100,000 COLONIES/mL METHICILLIN RESISTANT STAPHYLOCOCCUS AUREUS   Report Status 11/17/2018 FINAL  Final   Organism ID, Bacteria METHICILLIN RESISTANT STAPHYLOCOCCUS AUREUS (A)  Final      Susceptibility   Methicillin resistant staphylococcus aureus - MIC*    CIPROFLOXACIN >=8 RESISTANT Resistant     GENTAMICIN <=0.5 SENSITIVE Sensitive     NITROFURANTOIN <=16 SENSITIVE Sensitive     OXACILLIN >=4 RESISTANT Resistant     TETRACYCLINE <=1 SENSITIVE Sensitive     VANCOMYCIN 1 SENSITIVE Sensitive     TRIMETH/SULFA <=10 SENSITIVE Sensitive     CLINDAMYCIN >=8 RESISTANT Resistant     RIFAMPIN <=0.5 SENSITIVE Sensitive     Inducible Clindamycin NEGATIVE Sensitive     * >=100,000 COLONIES/mL METHICILLIN RESISTANT STAPHYLOCOCCUS AUREUS  CULTURE, BLOOD (ROUTINE X 2) w Reflex to ID Panel     Status: Abnormal   Collection Time: 11/14/18 10:53 AM   Specimen: BLOOD  Result Value Ref Range Status   Specimen Description   Final    BLOOD  L FOREARM Performed at [facility], [address] [place], [place] [address]    Special Requests   Final    BOTTLES DRAWN AEROBIC AND ANAEROBIC Blood Culture adequate volume Performed at [facility], [address] [place], [place] [address]    Culture  Setup Time   Final    GRAM POSITIVE COCCI AEROBIC BOTTLE ONLY CRITICAL VALUE NOTED.  VALUE IS CONSISTENT WITH PREVIOUSLY REPORTED AND CALLED VALUE. Performed at [facility], [address] [place], [place] [address]    Culture (A)  Final    STAPHYLOCOCCUS AUREUS SUSCEPTIBILITIES PERFORMED ON PREVIOUS CULTURE WITHIN THE LAST 5 DAYS. Performed at [facility], [place] [address] [place], [place] [address]    Report Status 11/17/2018 FINAL  Final  CULTURE, BLOOD (ROUTINE X 2) w Reflex to ID Panel     Status: Abnormal   Collection Time: 11/14/18 11:04 AM    Specimen: BLOOD  Result Value Ref Range Status   Specimen Description   Final    BLOOD L HAND Performed at [facility], [address], [place], [place] [address]    Special Requests   Final    BOTTLES DRAWN AEROBIC AND ANAEROBIC Blood Culture adequate volume Performed at [facility], [address], [place], [place] [address]    Culture  Setup Time   Final    GRAM POSITIVE COCCI IN BOTH AEROBIC AND ANAEROBIC BOTTLES CRITICAL RESULT CALLED TO, READ BACK BY AND VERIFIED WITH: CHARLES SHANLEVER AT 5102 ON 11/15/2018 [place]. Performed at [facility], [address], [place], [place] [address]    Culture METHICILLIN RESISTANT STAPHYLOCOCCUS AUREUS (A)  Final   Report Status 11/17/2018 FINAL  Final   Organism ID, Bacteria METHICILLIN RESISTANT STAPHYLOCOCCUS AUREUS  Final      Susceptibility   Methicillin resistant staphylococcus aureus - MIC*    CIPROFLOXACIN >=8 RESISTANT Resistant     ERYTHROMYCIN >=8 RESISTANT Resistant     GENTAMICIN <=0.5 SENSITIVE Sensitive     OXACILLIN >=4 RESISTANT Resistant     TETRACYCLINE <=1 SENSITIVE Sensitive     VANCOMYCIN 1 SENSITIVE Sensitive     TRIMETH/SULFA <=10 SENSITIVE Sensitive     CLINDAMYCIN >=8 RESISTANT Resistant     RIFAMPIN <=0.5 SENSITIVE Sensitive     Inducible Clindamycin NEGATIVE Sensitive     * METHICILLIN RESISTANT STAPHYLOCOCCUS AUREUS  Blood Culture ID Panel (Reflexed)     Status: Abnormal   Collection Time: 11/14/18 11:04 AM  Result Value Ref Range Status   Enterococcus species NOT DETECTED NOT DETECTED Final   Listeria monocytogenes NOT  DETECTED NOT DETECTED Final   Staphylococcus species DETECTED (A) NOT DETECTED Final    Comment: CRITICAL RESULT CALLED TO, READ BACK BY AND VERIFIED WITH: CHARLES SHANLEVER AT [phone] ON 11/15/2018 [place].    Staphylococcus aureus (BCID) DETECTED (A) NOT DETECTED Final    Comment: Methicillin (oxacillin)-resistant Staphylococcus aureus (MRSA). MRSA is predictably  resistant to beta-lactam antibiotics (except ceftaroline). Preferred therapy is vancomycin unless clinically contraindicated. Patient requires contact precautions if  hospitalized. CRITICAL RESULT CALLED TO, READ BACK BY AND VERIFIED WITH: CHARLES SHANLEVER AT [phone] ON 11/15/2018 [place].    Methicillin resistance DETECTED (A) NOT DETECTED Final    Comment: CRITICAL RESULT CALLED TO, READ BACK BY AND VERIFIED WITH: CHARLES SHANLEVER AT [phone] ON 11/15/2018 [place].    Streptococcus species NOT DETECTED NOT DETECTED Final   Streptococcus agalactiae NOT DETECTED NOT DETECTED Final   Streptococcus pneumoniae NOT DETECTED NOT DETECTED Final   Streptococcus pyogenes NOT DETECTED NOT DETECTED Final   Acinetobacter baumannii NOT DETECTED NOT DETECTED Final   Enterobacteriaceae species NOT DETECTED NOT DETECTED Final   Enterobacter cloacae complex NOT DETECTED NOT DETECTED Final   Escherichia coli NOT DETECTED NOT DETECTED Final   Klebsiella oxytoca NOT DETECTED NOT DETECTED Final   Klebsiella pneumoniae NOT DETECTED NOT DETECTED Final   Proteus species NOT DETECTED NOT DETECTED Final   Serratia marcescens NOT DETECTED NOT DETECTED Final   Haemophilus influenzae NOT DETECTED NOT DETECTED Final   Neisseria meningitidis NOT DETECTED NOT DETECTED Final   Pseudomonas aeruginosa NOT DETECTED NOT DETECTED Final   Candida albicans NOT DETECTED NOT DETECTED Final   Candida glabrata NOT DETECTED NOT DETECTED Final   Candida krusei NOT DETECTED NOT DETECTED Final   Candida parapsilosis NOT DETECTED NOT DETECTED Final   Candida tropicalis NOT DETECTED NOT DETECTED Final    Comment: Performed at [facility], [address] [place], [place] [address]  CULTURE, BLOOD (ROUTINE X 2) w Reflex to ID Panel     Status: None (Preliminary result)   Collection Time: 11/16/18 12:05 AM   Specimen: BLOOD  Result Value Ref Range Status   Specimen Description BLOOD LEFT ASSIST CONTROL  Final   Special Requests   Final     BOTTLES DRAWN AEROBIC AND ANAEROBIC Blood Culture results may not be optimal due to an excessive volume of blood received in culture bottles   Culture   Final    NO GROWTH 1 DAY Performed at [facility], [address] [place], [place] [address]    Report Status PENDING  Incomplete  CULTURE, BLOOD (ROUTINE X 2) w Reflex to ID Panel     Status: None (Preliminary result)   Collection Time: 11/16/18 12:11 AM   Specimen: BLOOD  Result Value Ref Range Status   Specimen Description BLOOD LEFT HAND  Final   Special Requests   Final    BOTTLES DRAWN AEROBIC AND ANAEROBIC Blood Culture adequate volume   Culture   Final    NO GROWTH 1 DAY Performed at [facility], [address] [place], [place] [address]    Report Status PENDING  Incomplete    Coagulation Studies: No results for input(s): LABPROT, INR in the last 72 hours.  Urinalysis: No results for input(s): COLORURINE, LABSPEC, PHURINE, GLUCOSEU, HGBUR, BILIRUBINUR, KETONESUR, PROTEINUR, UROBILINOGEN, NITRITE, LEUKOCYTESUR in the last 72 hours.  Invalid input(s): APPERANCEUR    Imaging: Dg Or Urology Cysto Image ([facility] Only)  Result Date: 11/17/2018 There is no interpretation for this exam.  This  order is for images obtained during a surgical procedure.  Please See "Surgeries" Tab for more information regarding the procedure.     Medications:   .  sodium bicarbonate (isotonic) infusion in sterile water 75 mL/hr at 11/17/18 0943   . aspirin EC  81 mg Oral Daily  . digoxin  0.25 mg Oral Daily  . docusate sodium  100 mg Oral BID  . finasteride  5 mg Oral Daily  . folic acid  1 mg Oral Daily  . gabapentin  300 mg Oral BID  . loratadine  10 mg Oral Daily  . LORazepam  0.5 mg Oral Daily  . metoprolol succinate  12.5 mg Oral Daily  . vancomycin variable dose per unstable renal function (pharmacist dosing)   Does not apply See admin instructions   acetaminophen **OR** acetaminophen, bisacodyl,  hydrOXYzine, ondansetron **OR** ondansetron (ZOFRAN) IV, senna  Assessment/ Plan:  63 y.o. male with medical problems of ch sys CHF, CKD, A Fib, HTN who is a resident of group home is admitted to [facility] on 11/10/2018 for evaluation of weakness, weight loss. Had left femoral neck fracture in 10/2018  1. AKI, bilateral hydronephrosis 2. Hypercalcemia 3. Weight loss, cachexia 4. Anemia 5.  Bladder mass 6.  Metabolic acidosis.  Plan: Patient with bilateral obstruction.  Due for TURBT today.  Hopefully this will help to relieve some element of the obstruction and his renal function will begin to improve.  Continue to monitor renal parameters closely.  He would make poor candidate for long-term dialysis at the moment given his underlying comorbidities.  Further plan as patient progresses.  LOS: 7 [place] Mckesson 8/12/20203:39 PM  [place], [place]  Note: This note was prepared with Dragon dictation. Any transcription errors are unintentional

## 2018-11-17 NOTE — Anesthesia Procedure Notes (Signed)
Procedure Name: LMA Insertion Date/Time: 11/17/2018 11:50 AM Performed by: Jonna Clark, CRNA Pre-anesthesia Checklist: Patient identified, Patient being monitored, Timeout performed, Emergency Drugs available and Suction available Patient Re-evaluated:Patient Re-evaluated prior to induction Oxygen Delivery Method: Circle system utilized Preoxygenation: Pre-oxygenation with 100% oxygen Induction Type: IV induction Ventilation: Mask ventilation without difficulty LMA: LMA inserted LMA Size: 4.0 Tube type: Oral Number of attempts: 1 Placement Confirmation: positive ETCO2 and breath sounds checked- equal and bilateral Tube secured with: Tape Dental Injury: Teeth and Oropharynx as per pre-operative assessment

## 2018-11-18 ENCOUNTER — Other Ambulatory Visit: Payer: Self-pay | Admitting: Hematology and Oncology

## 2018-11-18 DIAGNOSIS — Z936 Other artificial openings of urinary tract status: Secondary | ICD-10-CM

## 2018-11-18 LAB — BASIC METABOLIC PANEL
Anion gap: 11 (ref 5–15)
BUN: 60 mg/dL — ABNORMAL HIGH (ref 8–23)
CO2: 27 mmol/L (ref 22–32)
Calcium: 10.4 mg/dL — ABNORMAL HIGH (ref 8.9–10.3)
Chloride: 103 mmol/L (ref 98–111)
Creatinine, Ser: 3.32 mg/dL — ABNORMAL HIGH (ref 0.61–1.24)
GFR calc Af Amer: 22 mL/min — ABNORMAL LOW (ref >60)
GFR calc non Af Amer: 19 mL/min — ABNORMAL LOW (ref >60)
Glucose, Bld: 144 mg/dL — ABNORMAL HIGH (ref 70–99)
Potassium: 3.6 mmol/L (ref 3.5–5.1)
Sodium: 141 mmol/L (ref 135–145)

## 2018-11-18 LAB — UPEP/UIFE/LIGHT CHAINS/TP, 24-HR UR
% BETA, Urine: 28.5 %
ALPHA 1 URINE: 10.2 %
Albumin, U: 34.7 %
Alpha 2, Urine: 12.7 %
Free Kappa Lt Chains,Ur: 131.06 mg/L — ABNORMAL HIGH (ref 0.63–113.79)
Free Kappa/Lambda Ratio: 2.8 (ref 1.03–31.76)
Free Lambda Lt Chains,Ur: 46.87 mg/L — ABNORMAL HIGH (ref 0.47–11.77)
GAMMA GLOBULIN URINE: 13.9 %
M-SPIKE %, Urine: 5.6 % — ABNORMAL HIGH
M-Spike, Mg/24 Hr: 107 mg/24 hr — ABNORMAL HIGH
Total Protein, Urine-Ur/day: 1910 mg/24 hr — ABNORMAL HIGH (ref 30–150)
Total Protein, Urine: 127.3 mg/dL
Total Volume: 1500

## 2018-11-18 LAB — CBC
HCT: 25.2 % — ABNORMAL LOW (ref 39.0–52.0)
Hemoglobin: 8.1 g/dL — ABNORMAL LOW (ref 13.0–17.0)
MCH: 28.4 pg (ref 26.0–34.0)
MCHC: 32.1 g/dL (ref 30.0–36.0)
MCV: 88.4 fL (ref 80.0–100.0)
Platelets: 276 10*3/uL (ref 150–400)
RBC: 2.85 MIL/uL — ABNORMAL LOW (ref 4.22–5.81)
RDW: 15.5 % (ref 11.5–15.5)
WBC: 14.1 10*3/uL — ABNORMAL HIGH (ref 4.0–10.5)
nRBC: 0 % (ref 0.0–0.2)

## 2018-11-18 LAB — SURGICAL PATHOLOGY

## 2018-11-18 LAB — VANCOMYCIN, RANDOM: Vancomycin Rm: 18

## 2018-11-18 MED ORDER — HEPARIN SODIUM (PORCINE) 1000 UNIT/ML IJ SOLN
INTRAMUSCULAR | Status: AC
  Filled 2018-11-18: qty 1

## 2018-11-18 MED ORDER — FENTANYL CITRATE (PF) 100 MCG/2ML IJ SOLN
INTRAMUSCULAR | Status: AC
  Filled 2018-11-18: qty 2

## 2018-11-18 MED ORDER — MIDAZOLAM HCL 5 MG/5ML IJ SOLN
INTRAMUSCULAR | Status: AC
  Filled 2018-11-18: qty 5

## 2018-11-18 MED ORDER — TRAZODONE HCL 100 MG PO TABS
100.0000 mg | ORAL_TABLET | Freq: Every evening | ORAL | Status: DC | PRN
  Administered 2018-11-18 – 2018-11-21 (×3): 100 mg via ORAL
  Filled 2018-11-18 (×3): qty 1

## 2018-11-18 MED ORDER — VANCOMYCIN HCL IN DEXTROSE 1-5 GM/200ML-% IV SOLN
1000.0000 mg | Freq: Once | INTRAVENOUS | Status: AC
  Administered 2018-11-18: 1000 mg via INTRAVENOUS
  Filled 2018-11-18: qty 200

## 2018-11-18 NOTE — Progress Notes (Signed)
ID  Pt underwent cystoscopy yesterday- extensive bladder tumor, urologist was  unable to stent the ureters-  Pt to have b/l perc nephrostomy He says he does not want TEE. Does not want to have anything put in his mouth again he says Patient Vitals for the past 24 hrs:  BP Temp Temp src Pulse Resp SpO2 Weight  11/18/18 1529 103/77 (!) 97.5 F (36.4 C) Oral 63 18 100 % -  11/18/18 0814 (!) 89/60 - Oral 79 - 99 % -  11/18/18 0540 91/61 97.6 F (36.4 C) Oral 78 - 97 % 76.5 kg  11/18/18 0200 - 97.6 F (36.4 C) Oral - - - -  11/17/18 2047 104/67 - - 83 - 98 % -  11/17/18 1712 90/63 - - 78 - - -   Awake and alert Chest B/L air entry abd- suprapubic mass felt CBC Latest Ref Rng & Units 11/18/2018 11/17/2018 11/16/2018  WBC 4.0 - 10.5 K/uL 14.1(H) 23.1(H) 22.9(H)  Hemoglobin 13.0 - 17.0 g/dL 8.1(L) 8.3(L) 9.6(L)  Hematocrit 39.0 - 52.0 % 25.2(L) 25.5(L) 29.7(L)  Platelets 150 - 400 K/uL 276 329 404(H)    CMP Latest Ref Rng & Units 11/18/2018 11/17/2018 11/16/2018  Glucose 70 - 99 mg/dL 144(H) 70 98  BUN 8 - 23 mg/dL 60(H) 57(H) 53(H)  Creatinine 0.61 - 1.24 mg/dL 3.32(H) 3.83(H) 3.25(H)  Sodium 135 - 145 mmol/L 141 137 138  Potassium 3.5 - 5.1 mmol/L 3.6 3.6 3.6  Chloride 98 - 111 mmol/L 103 103 107  CO2 22 - 32 mmol/L 27 24 23   Calcium 8.9 - 10.3 mg/dL 10.4(H) 11.1(H) 11.7(H)  Total Protein 6.5 - 8.1 g/dL - - -  Total Bilirubin 0.3 - 1.2 mg/dL - - -  Alkaline Phos 38 - 126 U/L - - -  AST 15 - 41 U/L - - -  ALT 0 - 44 U/L - - -    Impression/Recommendation MRSA bacteremia On vancomycin 2 d echo does not show vegetation- needs TEE as it will determine the duration of Iv antibiotic but patient does not want one.so will treat for minimum 4 weeks Repeat blood culture has been sent.  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 Underwent TURBT.  Cefepime was started on 8/12 because his blood pressure was low and leucocytosis.  wbc is decreased  very likely after bladder wash out and release of purulent fluid and resection of some tumor As the ureters could not be stented he will undergo nephrostomy  AKI on CKD due to the obstruction  Leucocytosis- due to the tumor burden and obstruction May need nephrostomy  Discussed the management with patient

## 2018-11-18 NOTE — Anesthesia Postprocedure Evaluation (Signed)
Anesthesia Post Note  Patient: Ruben Clark  Procedure(s) Performed: TRANSURETHRAL RESECTION OF BLADDER TUMOR (TURBT) (N/A Bladder) CYSTOSCOPY WITH RETROGRADE PYELOGRAM (Right Ureter)  Patient location during evaluation: PACU Anesthesia Type: General Level of consciousness: awake and alert Pain management: pain level controlled Vital Signs Assessment: post-procedure vital signs reviewed and stable Respiratory status: spontaneous breathing, nonlabored ventilation, respiratory function stable and patient connected to nasal cannula oxygen Cardiovascular status: blood pressure returned to baseline and stable (Pt on lower side of baseline, no symptoms, answering questions) Postop Assessment: no apparent nausea or vomiting Anesthetic complications: no     Last Vitals:  Vitals:   11/18/18 0200 11/18/18 0540  BP:  91/61  Pulse:  78  Resp:    Temp: 36.4 C 36.4 C  SpO2:  97%    Last Pain:  Vitals:   11/18/18 0540  TempSrc: Oral  PainSc:                  Alphonsus Sias

## 2018-11-18 NOTE — Progress Notes (Signed)
Daily Progress Note   Patient Name: Ruben Clark       Date: 11/18/2018 DOB: 10/15/1955  Age: 63 y.o. MRN#: [id number] Attending Physician: Saundra Shelling, MD Primary Care Physician: System, Pcp Not In Admit Date: 11/10/2018  Reason for Consultation/Follow-up: Psychosocial/spiritual support  Subjective: Patient is sitting up in bed eating lunch, and has eaten over half of his tray. He states "they are trying to put a tube down my throat to look at my heart and I told them "no". I don't want any tubes put in my mouth or down my throat." He reconfirms he would not want chemotherapy or radiation if he were to be a candidate. He also reconfirms he would not want "to be put to sleep", or to have intubation or CPR. Ruben Clark wants to return to his group home.  Per notes, oncology to speak with legal guardian once pathology has been finalized.    VM left for legal guardian Ruben Clark.    Length of Stay: 8  Current Medications: Scheduled Meds:  . aspirin EC  81 mg Oral Daily  . digoxin  0.25 mg Oral Daily  . docusate sodium  100 mg Oral BID  . finasteride  5 mg Oral Daily  . folic acid  1 mg Oral Daily  . gabapentin  300 mg Oral BID  . loratadine  10 mg Oral Daily  . LORazepam  0.5 mg Oral Daily  . metoprolol succinate  12.5 mg Oral Daily  . vancomycin variable dose per unstable renal function (pharmacist dosing)   Does not apply See admin instructions    Continuous Infusions: . ceFEPime (MAXIPIME) IV 200 mL/hr at 11/17/18 1649  .  sodium bicarbonate (isotonic) infusion in sterile water 100 mL/hr at 11/18/18 0204    PRN Meds: acetaminophen **OR** acetaminophen, bisacodyl, hydrOXYzine, ondansetron **OR** ondansetron (ZOFRAN) IV, senna  Physical Exam Pulmonary:     Effort: Pulmonary  effort is normal.  Skin:    General: Skin is warm and dry.  Neurological:     Mental Status: He is alert.             Vital Signs: BP (!) 89/60   Pulse 79   Temp 97.6 F (36.4 C) (Oral)   Resp 14   Ht 5\' 10"  (1.778 m)   Wt 76.5 kg  SpO2 99%   BMI 24.20 kg/m  SpO2: SpO2: 99 % O2 Device: O2 Device: Room Air O2 Flow Rate: O2 Flow Rate (L/min): 5 L/min  Intake/output summary:   Intake/Output Summary (Last 24 hours) at 11/18/2018 1337 Last data filed at 11/18/2018 0102 Gross per 24 hour  Intake 2071.07 ml  Output 2375 ml  Net -303.93 ml   LBM: Last BM Date: 11/17/18 Baseline Weight: Weight: 85 kg Most recent weight: Weight: 76.5 kg       Palliative Assessment/Data:      Patient Active Problem List   Diagnosis Date Noted  . Hypercalcemia 11/10/2018  . Closed left hip fracture ([place]) 10/19/2018  . Chronic systolic CHF (congestive heart failure) ([place]) 10/19/2018  . HTN (hypertension) 10/19/2018  . Sepsis ([place]) 10/07/2018  . COPD (chronic obstructive pulmonary disease) ([place]) 08/26/2018  . Atrial fibrillation ([place]) 08/26/2018  . Left foot infection 08/02/2018    Palliative Care Assessment & Plan   Recommendations/Plan:  Patient does not want TEE. He does not want oncologic intervention. He does not want procedures involving sedation or GETA, as he would "have to go to sleep".   VM left for legal guardian.    Code Status:    Code Status Orders  (From admission, onward)         Start     Ordered   11/10/18 2157  Full code  Continuous     11/10/18 2157        Code Status History    Date Active Date Inactive Code Status Order ID Comments User Context   10/19/2018 0114 10/21/2018 2305 Full Code [id number]  Lance Coon, MD ED   10/07/2018 0442 10/11/2018 1817 Full Code [id number]  Mayer Camel, NP ED   08/02/2018 1502 08/09/2018 1743 Full Code [id number]  Mayo, Pete Pelt, MD Inpatient   Advance Care Planning Activity    Advance Directive Documentation      Most Recent Value  Type of Advance Directive  Healthcare Power of Attorney, Living will  Pre-existing out of facility DNR order (yellow form or pink MOST form)  -  "MOST" Form in Place?  -       Prognosis:  < 6 months.   Discharge Planning:  To Be Determined   Thank you for allowing the Palliative Medicine Team to assist in the care of this patient.   Total Time 35 min Prolonged Time Billed no      Greater than 50%  of this time was spent counseling and coordinating care related to the above assessment and plan.  Asencion Gowda, NP  Please contact Palliative Medicine Team phone at [phone] for questions and concerns.

## 2018-11-18 NOTE — Progress Notes (Signed)
Patient is postop transurethral resection of bladder tumor with and retrograde pyelogram and placement of 3 way foley catheter. Patient has no acute event overnight,remained in asymptomatic controlled afib in the 60-70. Patient's foley remained patent with adequate urine output. Patient urine output remined cloudy,pink with sediments. Patient rested overnight, denied being in pain or discomfort. Patient was A&O X4 with intermittent confusion.

## 2018-11-18 NOTE — Progress Notes (Signed)
[facility], [place] 11/18/18  Subjective:  Patient underwent TURBT yesterday however ureteral stents could not be placed. However patient appears to be much better spirits today.   Objective:  Vital signs in last 24 hours:  Temp:  [97.2 F (36.2 C)-97.7 F (36.5 C)] 97.6 F (36.4 C) (08/13 0540) Pulse Rate:  [61-85] 79 (08/13 0814) Resp:  [10-25] 14 (08/12 1504) BP: (77-118)/(45-80) 89/60 (08/13 0814) SpO2:  [95 %-100 %] 99 % (08/13 0814) Weight:  [76.5 kg] 76.5 kg (08/13 0540)  Weight change: -0 kg Filed Weights   11/17/18 0500 11/17/18 1057 11/18/18 0540  Weight: 77.2 kg 77.2 kg 76.5 kg    Intake/Output:    Intake/Output Summary (Last 24 hours) at 11/18/2018 1204 Last data filed at 11/18/2018 8119 Gross per 24 hour  Intake 2371.07 ml  Output 2375 ml  Net -3.93 ml     Physical Exam: General:  Thin, cachectic gentleman, laying in the bed  HEENT  anicteric, moist oral mucous membranes  Neck  supple  Pulm/lungs  clear to auscultation, normal effort  CVS/Heart  no rub, S1-S2  Abdomen:   soft, BS present  Extremities:  No edema  Neurologic:  Alert, oriented to person/place  Skin:  Dry skin    Basic Metabolic Panel:  Recent Labs  Lab 11/11/18 1500  11/14/18 0540 11/15/18 0455 11/16/18 0012 11/17/18 0639 11/18/18 0532  NA  --    < > 141 142 138 137 141  K  --    < > 4.1 3.6 3.6 3.6 3.6  CL  --    < > 119* 115* 107 103 103  CO2  --    < > 15* 20* 23 24 27   GLUCOSE  --    < > 76 96 98 70 144*  BUN  --    < > 51* 49* 53* 57* 60*  CREATININE  --    < > 3.06* 3.00* 3.25* 3.83* 3.32*  CALCIUM  --    < > 12.1* 12.0* 11.7* 11.1* 10.4*  PHOS 3.5  --   --   --   --   --   --    < > = values in this interval not displayed.     CBC: Recent Labs  Lab 11/14/18 0540 11/15/18 0455 11/16/18 0012 11/17/18 0639 11/18/18 0532  WBC 24.4* 21.5* 22.9* 23.1* 14.1*  HGB 9.6* 9.3* 9.6* 8.3* 8.1*  HCT 31.2* 30.3* 29.7* 25.5* 25.2*  MCV  94.0 93.8 89.5 88.2 88.4  PLT 413* 383 404* 329 276      Lab Results  Component Value Date   HEPBSAG Negative 11/11/2018   HEPBSAB Non Reactive 11/11/2018      Microbiology:  Recent Results (from the past 240 hour(s))  SARS Coronavirus 2 [facility] order, Performed in [facility] hospital lab) Nasopharyngeal Nasopharyngeal Swab     Status: None   Collection Time: 11/10/18  6:40 PM   Specimen: Nasopharyngeal Swab  Result Value Ref Range Status   SARS Coronavirus 2 NEGATIVE NEGATIVE Final    Comment: (NOTE) If result is NEGATIVE SARS-CoV-2 target nucleic acids are NOT DETECTED. The SARS-CoV-2 RNA is generally detectable in upper and lower  respiratory specimens during the acute phase of infection. The lowest  concentration of SARS-CoV-2 viral copies this assay can detect is 250  copies / mL. A negative result does not preclude SARS-CoV-2 infection  and should not be used as the sole basis for treatment or other  patient  management decisions.  A negative result may occur with  improper specimen collection / handling, submission of specimen other  than nasopharyngeal swab, presence of viral mutation(s) within the  areas targeted by this assay, and inadequate number of viral copies  (<250 copies / mL). A negative result must be combined with clinical  observations, patient history, and epidemiological information. If result is POSITIVE SARS-CoV-2 target nucleic acids are DETECTED. The SARS-CoV-2 RNA is generally detectable in upper and lower  respiratory specimens dur ing the acute phase of infection.  Positive  results are indicative of active infection with SARS-CoV-2.  Clinical  correlation with patient history and other diagnostic information is  necessary to determine patient infection status.  Positive results do  not rule out bacterial infection or co-infection with other viruses. If result is PRESUMPTIVE POSTIVE SARS-CoV-2 nucleic acids MAY BE PRESENT.   A presumptive  positive result was obtained on the submitted specimen  and confirmed on repeat testing.  While 2019 novel coronavirus  (SARS-CoV-2) nucleic acids may be present in the submitted sample  additional confirmatory testing may be necessary for epidemiological  and / or clinical management purposes  to differentiate between  SARS-CoV-2 and other Sarbecovirus currently known to infect humans.  If clinically indicated additional testing with an alternate test  methodology [phone]) is advised. The SARS-CoV-2 RNA is generally  detectable in upper and lower respiratory sp ecimens during the acute  phase of infection. The expected result is Negative. Fact Sheet for Patients:  StrictlyIdeas.no Fact Sheet for Healthcare Providers: BankingDealers.co.za This test is not yet approved or cleared by the [place] FDA and has been authorized for detection and/or diagnosis of SARS-CoV-2 by FDA under an Emergency Use Authorization (EUA).  This EUA will remain in effect (meaning this test can be used) for the duration of the COVID-19 declaration under Section 564(b)(1) of the Act, 21 U.S.C. section 360bbb-3(b)(1), unless the authorization is terminated or revoked sooner. Performed at [facility], [address] [place], [place] [address]   Urine Culture     Status: Abnormal   Collection Time: 11/13/18 11:21 PM   Specimen: Urine, Random  Result Value Ref Range Status   Specimen Description   Final    URINE, RANDOM Performed at [facility], [address] [place], [place] [address]    Special Requests   Final    NONE Performed at [facility], [address] [place], [place] [address]    Culture (A)  Final    >=100,000 COLONIES/mL METHICILLIN RESISTANT STAPHYLOCOCCUS AUREUS   Report Status 11/17/2018 FINAL  Final   Organism ID, Bacteria METHICILLIN RESISTANT STAPHYLOCOCCUS AUREUS (A)  Final      Susceptibility    Methicillin resistant staphylococcus aureus - MIC*    CIPROFLOXACIN >=8 RESISTANT Resistant     GENTAMICIN <=0.5 SENSITIVE Sensitive     NITROFURANTOIN <=16 SENSITIVE Sensitive     OXACILLIN >=4 RESISTANT Resistant     TETRACYCLINE <=1 SENSITIVE Sensitive     VANCOMYCIN 1 SENSITIVE Sensitive     TRIMETH/SULFA <=10 SENSITIVE Sensitive     CLINDAMYCIN >=8 RESISTANT Resistant     RIFAMPIN <=0.5 SENSITIVE Sensitive     Inducible Clindamycin NEGATIVE Sensitive     * >=100,000 COLONIES/mL METHICILLIN RESISTANT STAPHYLOCOCCUS AUREUS  CULTURE, BLOOD (ROUTINE X 2) w Reflex to ID Panel     Status: Abnormal   Collection Time: 11/14/18 10:53 AM   Specimen: BLOOD  Result Value Ref Range Status   Specimen  Description   Final    BLOOD  L FOREARM Performed at [facility], [address], [place], [place] [address]    Special Requests   Final    BOTTLES DRAWN AEROBIC AND ANAEROBIC Blood Culture adequate volume Performed at [facility], [address] [place], [place] [address]    Culture  Setup Time   Final    GRAM POSITIVE COCCI AEROBIC BOTTLE ONLY CRITICAL VALUE NOTED.  VALUE IS CONSISTENT WITH PREVIOUSLY REPORTED AND CALLED VALUE. Performed at [facility], [address] [place], [place] [address]    Culture (A)  Final    STAPHYLOCOCCUS AUREUS SUSCEPTIBILITIES PERFORMED ON PREVIOUS CULTURE WITHIN THE LAST 5 DAYS. Performed at [facility], [place] [address] [place], [place] [address]    Report Status 11/17/2018 FINAL  Final  CULTURE, BLOOD (ROUTINE X 2) w Reflex to ID Panel     Status: Abnormal   Collection Time: 11/14/18 11:04 AM   Specimen: BLOOD  Result Value Ref Range Status   Specimen Description   Final    BLOOD L HAND Performed at [facility], [address], [place], [place] [address]    Special Requests   Final    BOTTLES DRAWN AEROBIC AND ANAEROBIC Blood Culture adequate volume Performed at [facility], [address], [place], [place] [address]    Culture  Setup Time   Final    GRAM POSITIVE COCCI IN BOTH AEROBIC AND ANAEROBIC BOTTLES CRITICAL RESULT CALLED TO, READ BACK BY AND VERIFIED WITH: CHARLES SHANLEVER AT 5170 ON 11/15/2018 [place]. Performed at [facility], [place] [address] [place], [place] [address]    Culture METHICILLIN RESISTANT STAPHYLOCOCCUS AUREUS (A)  Final   Report Status 11/17/2018 FINAL  Final   Organism ID, Bacteria METHICILLIN RESISTANT STAPHYLOCOCCUS AUREUS  Final      Susceptibility   Methicillin resistant staphylococcus aureus - MIC*    CIPROFLOXACIN >=8 RESISTANT Resistant     ERYTHROMYCIN >=8 RESISTANT Resistant     GENTAMICIN <=0.5 SENSITIVE Sensitive     OXACILLIN >=4 RESISTANT Resistant     TETRACYCLINE <=1 SENSITIVE Sensitive     VANCOMYCIN 1 SENSITIVE Sensitive     TRIMETH/SULFA <=10 SENSITIVE Sensitive     CLINDAMYCIN >=8 RESISTANT Resistant     RIFAMPIN <=0.5 SENSITIVE Sensitive     Inducible Clindamycin NEGATIVE Sensitive     * METHICILLIN RESISTANT STAPHYLOCOCCUS AUREUS  Blood Culture ID Panel (Reflexed)     Status: Abnormal   Collection Time: 11/14/18 11:04 AM  Result Value Ref Range Status   Enterococcus species NOT DETECTED NOT DETECTED Final   Listeria monocytogenes NOT DETECTED NOT DETECTED Final   Staphylococcus species DETECTED (A) NOT DETECTED Final    Comment: CRITICAL RESULT CALLED TO, READ BACK BY AND VERIFIED WITH: CHARLES SHANLEVER AT 4496 ON 11/15/2018 [place].    Staphylococcus aureus (BCID) DETECTED (A) NOT DETECTED Final    Comment: Methicillin (oxacillin)-resistant Staphylococcus aureus (MRSA). MRSA is predictably resistant to beta-lactam antibiotics (except ceftaroline). Preferred therapy is vancomycin unless clinically contraindicated. Patient requires contact precautions if  hospitalized. CRITICAL RESULT CALLED TO, READ BACK BY AND VERIFIED WITH: CHARLES SHANLEVER AT 7591 ON 11/15/2018 [place].    Methicillin resistance DETECTED (A) NOT  DETECTED Final    Comment: CRITICAL RESULT CALLED TO, READ BACK BY AND VERIFIED WITH: CHARLES SHANLEVER AT [phone] ON 11/15/2018 [place].    Streptococcus species NOT DETECTED NOT DETECTED Final   Streptococcus agalactiae NOT DETECTED  NOT DETECTED Final   Streptococcus pneumoniae NOT DETECTED NOT DETECTED Final   Streptococcus pyogenes NOT DETECTED NOT DETECTED Final   Acinetobacter baumannii NOT DETECTED NOT DETECTED Final   Enterobacteriaceae species NOT DETECTED NOT DETECTED Final   Enterobacter cloacae complex NOT DETECTED NOT DETECTED Final   Escherichia coli NOT DETECTED NOT DETECTED Final   Klebsiella oxytoca NOT DETECTED NOT DETECTED Final   Klebsiella pneumoniae NOT DETECTED NOT DETECTED Final   Proteus species NOT DETECTED NOT DETECTED Final   Serratia marcescens NOT DETECTED NOT DETECTED Final   Haemophilus influenzae NOT DETECTED NOT DETECTED Final   Neisseria meningitidis NOT DETECTED NOT DETECTED Final   Pseudomonas aeruginosa NOT DETECTED NOT DETECTED Final   Candida albicans NOT DETECTED NOT DETECTED Final   Candida glabrata NOT DETECTED NOT DETECTED Final   Candida krusei NOT DETECTED NOT DETECTED Final   Candida parapsilosis NOT DETECTED NOT DETECTED Final   Candida tropicalis NOT DETECTED NOT DETECTED Final    Comment: Performed at [facility], [address] [place], [place] [address]  CULTURE, BLOOD (ROUTINE X 2) w Reflex to ID Panel     Status: None (Preliminary result)   Collection Time: 11/16/18 12:05 AM   Specimen: BLOOD  Result Value Ref Range Status   Specimen Description BLOOD LEFT ASSIST CONTROL  Final   Special Requests   Final    BOTTLES DRAWN AEROBIC AND ANAEROBIC Blood Culture results may not be optimal due to an excessive volume of blood received in culture bottles   Culture   Final    NO GROWTH 2 DAYS Performed at [facility], [address] [place], [place] [address]    Report Status PENDING  Incomplete  CULTURE, BLOOD (ROUTINE  X 2) w Reflex to ID Panel     Status: None (Preliminary result)   Collection Time: 11/16/18 12:11 AM   Specimen: BLOOD  Result Value Ref Range Status   Specimen Description BLOOD LEFT HAND  Final   Special Requests   Final    BOTTLES DRAWN AEROBIC AND ANAEROBIC Blood Culture adequate volume   Culture   Final    NO GROWTH 2 DAYS Performed at [facility], [address] [place], [place] [address]    Report Status PENDING  Incomplete    Coagulation Studies: No results for input(s): LABPROT, INR in the last 72 hours.  Urinalysis: No results for input(s): COLORURINE, LABSPEC, PHURINE, GLUCOSEU, HGBUR, BILIRUBINUR, KETONESUR, PROTEINUR, UROBILINOGEN, NITRITE, LEUKOCYTESUR in the last 72 hours.  Invalid input(s): APPERANCEUR    Imaging: Dg Or Urology Cysto Image ([facility] Only)  Result Date: 11/17/2018 There is no interpretation for this exam.  This order is for images obtained during a surgical procedure.  Please See "Surgeries" Tab for more information regarding the procedure.     Medications:   . ceFEPime (MAXIPIME) IV 200 mL/hr at 11/17/18 1649  .  sodium bicarbonate (isotonic) infusion in sterile water 100 mL/hr at 11/18/18 0204   . aspirin EC  81 mg Oral Daily  . digoxin  0.25 mg Oral Daily  . docusate sodium  100 mg Oral BID  . finasteride  5 mg Oral Daily  . folic acid  1 mg Oral Daily  . gabapentin  300 mg Oral BID  . loratadine  10 mg Oral Daily  . LORazepam  0.5 mg Oral Daily  . metoprolol succinate  12.5 mg Oral Daily  . vancomycin variable dose per unstable renal function (pharmacist dosing)   Does  not apply See admin instructions   acetaminophen **OR** acetaminophen, bisacodyl, hydrOXYzine, ondansetron **OR** ondansetron (ZOFRAN) IV, senna  Assessment/ Plan:  63 y.o. male with medical problems of ch sys CHF, CKD, A Fib, HTN who is a resident of group home is admitted to [facility] on 11/10/2018 for evaluation of weakness, weight loss. Had left femoral neck  fracture in 10/2018  1. AKI, bilateral hydronephrosis 2. Hypercalcemia 3. Weight loss, cachexia 4. Anemia 5.  Bladder mass 6.  Metabolic acidosis.  Plan: Patient underwent TURBT yesterday.  Unfortunately ureteral stents could not be placed.  Therefore consideration is now being given to nephrostomy tubes.  Await further input from urology in this regard.  Renal function has improved slightly today as creatinine was 3.3 and good urine output was noted as well.  Continue to monitor renal parameters daily.  No acute indication for dialysis.  LOS: 8 Homer Pfeifer 8/13/202012:04 PM  [place], [place]  Note: This note was prepared with Dragon dictation. Any transcription errors are unintentional

## 2018-11-18 NOTE — Progress Notes (Addendum)
Urology Consult Follow Up  Subjective: No complaints this morning  Anti-infectives: Anti-infectives (From admission, onward)   Start     Dose/Rate Route Frequency Ordered Stop   11/17/18 1700  ceFEPIme (MAXIPIME) 2 g in sodium chloride 0.9 % 100 mL IVPB     2 g 200 mL/hr over 30 Minutes Intravenous Every 24 hours 11/17/18 1554     11/16/18 1600  vancomycin (VANCOCIN) 1,250 mg in sodium chloride 0.9 % 250 mL IVPB     1,250 mg 166.7 mL/hr over 90 Minutes Intravenous  Once 11/16/18 1454 11/16/18 1934   11/16/18 1000  vancomycin (VANCOCIN) 1,250 mg in sodium chloride 0.9 % 250 mL IVPB  Status:  Discontinued     1,250 mg 166.7 mL/hr over 90 Minutes Intravenous Every 48 hours 11/14/18 1010 11/15/18 0812   11/15/18 0805  vancomycin variable dose per unstable renal function (pharmacist dosing)      Does not apply See admin instructions 11/15/18 0812     11/14/18 1015  vancomycin (VANCOCIN) 1,500 mg in sodium chloride 0.9 % 500 mL IVPB     1,500 mg 250 mL/hr over 120 Minutes Intravenous  Once 11/14/18 1000 11/14/18 1352   11/14/18 1000  ceFEPIme (MAXIPIME) 2 g in sodium chloride 0.9 % 100 mL IVPB  Status:  Discontinued     2 g 200 mL/hr over 30 Minutes Intravenous Every 24 hours 11/14/18 0959 11/15/18 0803   11/14/18 1000  vancomycin (VANCOCIN) 1,500 mg in sodium chloride 0.9 % 500 mL IVPB  Status:  Discontinued     1,500 mg 250 mL/hr over 120 Minutes Intravenous  Once 11/14/18 0959 11/14/18 1000      Current Facility-Administered Medications  Medication Dose Route Frequency Provider Last Rate Last Dose  . acetaminophen (TYLENOL) tablet 650 mg  650 mg Oral Q6H PRN Saundra Shelling, MD   650 mg at 11/13/18 0137   Or  . acetaminophen (TYLENOL) suppository 650 mg  650 mg Rectal Q6H PRN Saundra Shelling, MD      . aspirin EC tablet 81 mg  81 mg Oral Daily Pyreddy, Pavan, MD   81 mg at 11/16/18 1004  . bisacodyl (DULCOLAX) EC tablet 5 mg  5 mg Oral Daily PRN Pyreddy, Reatha Harps, MD      . ceFEPIme  (MAXIPIME) 2 g in sodium chloride 0.9 % 100 mL IVPB  2 g Intravenous Q24H Saundra Shelling, MD 200 mL/hr at 11/17/18 1649    . digoxin (LANOXIN) tablet 0.25 mg  0.25 mg Oral Daily Pyreddy, Pavan, MD   0.25 mg at 11/16/18 1004  . docusate sodium (COLACE) capsule 100 mg  100 mg Oral BID Saundra Shelling, MD   100 mg at 11/17/18 2118  . finasteride (PROSCAR) tablet 5 mg  5 mg Oral Daily Pyreddy, Pavan, MD   5 mg at 11/16/18 1004  . folic acid (FOLVITE) tablet 1 mg  1 mg Oral Daily Corcoran, Melissa C, MD   1 mg at 11/16/18 1004  . gabapentin (NEURONTIN) capsule 300 mg  300 mg Oral BID Saundra Shelling, MD   300 mg at 11/17/18 2118  . hydrOXYzine (ATARAX/VISTARIL) tablet 25 mg  25 mg Oral Q8H PRN Saundra Shelling, MD      . loratadine (CLARITIN) tablet 10 mg  10 mg Oral Daily Pyreddy, Reatha Harps, MD   10 mg at 11/16/18 1004  . LORazepam (ATIVAN) tablet 0.5 mg  0.5 mg Oral Daily Pyreddy, Pavan, MD   0.5 mg at 11/16/18 1004  . metoprolol succinate (  TOPROL-XL) 24 hr tablet 12.5 mg  12.5 mg Oral Daily Pyreddy, Pavan, MD   12.5 mg at 11/17/18 0940  . ondansetron (ZOFRAN) tablet 4 mg  4 mg Oral Q6H PRN Saundra Shelling, MD       Or  . ondansetron (ZOFRAN) injection 4 mg  4 mg Intravenous Q6H PRN Pyreddy, Reatha Harps, MD      . senna (SENOKOT) tablet 8.6 mg  1 tablet Oral Daily PRN Pyreddy, Reatha Harps, MD      . sodium bicarbonate 150 mEq in sterile water 1,000 mL infusion   Intravenous Continuous Saundra Shelling, MD 100 mL/hr at 11/18/18 0204    . vancomycin variable dose per unstable renal function (pharmacist dosing)   Does not apply See admin instructions [name], RPH         Objective: Vital signs in last 24 hours: Temp:  [97.2 F (36.2 C)-98 F (36.7 C)] 97.6 F (36.4 C) (08/13 0540) Pulse Rate:  [61-98] 78 (08/13 0540) Resp:  [10-25] 14 (08/12 1504) BP: (77-118)/(45-80) 91/61 (08/13 0540) SpO2:  [95 %-100 %] 97 % (08/13 0540) Weight:  [76.5 kg-77.2 kg] 76.5 kg (08/13 0540)  Intake/Output from previous  day: 08/12 0701 - 08/13 0700 In: 2371.1 [I.V.:1909.1; IV Piggyback:462] Out: 2375 [Urine:2375] Intake/Output this shift: No intake/output data recorded.   Physical Exam: Foley draining pink-tinged urine  Lab Results:  Recent Labs    11/17/18 0639 11/18/18 0532  WBC 23.1* 14.1*  HGB 8.3* 8.1*  HCT 25.5* 25.2*  PLT 329 276   BMET Recent Labs    11/17/18 0639 11/18/18 0532  NA 137 141  K 3.6 3.6  CL 103 103  CO2 24 27  GLUCOSE 70 144*  BUN 57* 60*  CREATININE 3.83* 3.32*  CALCIUM 11.1* 10.4*    Assessment: s/p Procedure(s): TRANSURETHRAL RESECTION OF BLADDER TUMOR (TURBT) CYSTOSCOPY WITH RETROGRADE PYELOGRAM  Cystoscopic findings suspicious for high-grade urothelial carcinoma with squamous differentiation versus squamous cell carcinoma and CT consistent with metastatic disease  Unable to place ureteral stents.  Creatinine slightly improved today most likely secondary to improved bladder drainage from the larger catheter.  Pathology report returned late this morning and shows invasive keratinizing squamous cell carcinoma.  With imaging suggesting liver metastasis prognosis is poor.  Plan:  -Decision will need to be made regarding percutaneous nephrostomy drainage versus continued monitoring of his creatinine.  -Continue Foley catheter drainage   Ronda Fairly [facility] 11/18/2018

## 2018-11-18 NOTE — Progress Notes (Signed)
   Consulted on patient for TEE due to bacteremia. Patient did not consent to procedure and it has been cancelled.  Signed, Arvil Chaco, PA-C 11/18/2018, 3:24 PM Pager [phone]

## 2018-11-18 NOTE — Progress Notes (Signed)
Pharmacy Antibiotic Note  Ruben Clark is a 63 y.o. male admitted on 11/10/2018 with sepsis.  Pharmacy has been consulted for Vancomycin and Cefepime dosing. Cefepime was discontinued d/t blood cultures.    Plan: Vanc random 18. This does not reflect true trough as patient is not on stable dose. Patient may be accumulating vanc as renal function has not significantly improved. As patient has MRSA bacteremia, will give one dose of vancomycin 1 g. Pharmacist to follow renal function and reassess vancomycin dosing as indicated.   Height: 5\' 10"  (177.8 cm) Weight: 168 lb 10.4 oz (76.5 kg) IBW/kg (Calculated) : 73  Temp (24hrs), Avg:97.6 F (36.4 C), Min:97.5 F (36.4 C), Max:97.6 F (36.4 C)  Recent Labs  Lab 11/14/18 0540 11/14/18 1053 11/15/18 0455 11/16/18 0012 11/16/18 1307 11/17/18 0639 11/18/18 0532 11/18/18 1822  WBC 24.4*  --  21.5* 22.9*  --  23.1* 14.1*  --   CREATININE 3.06*  --  3.00* 3.25*  --  3.83* 3.32*  --   LATICACIDVEN  --  1.8  --   --   --   --   --   --   VANCORANDOM  --   --   --   --  15  --   --  18    Estimated Creatinine Clearance: 23.8 mL/min (A) (by C-G formula based on SCr of 3.32 mg/dL (H)).    Allergies  Allergen Reactions  . 5-Alpha Reductase Inhibitors     Antimicrobials this admission: Vancomycin  8/9 >>  Cefepime 8/9 >> 8/10  Microbiology: 8/8 Urine: MRSA 8/9 Blood: MRSA 8/11 Blood: NG 2 days  Thank you for allowing pharmacy to be a part of this patient's care.  Dorena Bodo, PharmD Clinical Pharmacist 11/18/2018 7:28 PM

## 2018-11-18 NOTE — Progress Notes (Signed)
    [facility] has been requested to perform a transesophageal echocardiogram on 11/19/2018  for bacteremia.  After careful review of history and examination, the risks and benefits of transesophageal echocardiogram have been explained including risks of esophageal damage, perforation (1:10,000 risk), bleeding, pharyngeal hematoma as well as other potential complications associated with conscious sedation including aspiration, arrhythmia, respiratory failure and death. Alternatives to treatment were discussed, questions were answered.   Patient is not willing to proceed as he is uncomfortable with a procedure in which they need to "put something in his mouth."  Scheduled for 08:30AM with Dr. Rockey Situ on 11/19/2018. If cannot consent patient for procedure, will need to cancel this TEE.  Arvil Chaco, PA-C 11/18/2018 1:06 PM

## 2018-11-18 NOTE — Progress Notes (Signed)
Ch visited pt as a referral. Pt was alert and welcomed the visit. Ch allowed space fo rthe pt to share his desires for his health. Pt shares that he does not want to have a procedure that would require being put to sleep or have a tube down his throat. He shared that he does not want to have a sore throat and he would rather have PT and get enough strength to walk on his own. Ch asked deepening questions of apparent core life concerns. Pt has loss all of his family members to cancer. Both parents worked in [place] as reported by pt. Treatment options related to the cancer is not something that the pt wants to move forward with considering how he witness his family suffer in the past. Ch understood pt's concerns and why he would not want to move forward with any aggressive measures. Pt share that he desire to be cremated if he dies.  Goals: F/U with pt for spiritual and social support  F/U with care team members to determine if guardian would consent to pt receiving PT upon d/c.    11/18/18 1500  Clinical Encounter Type  Visited With Patient  Visit Type Psychological support;Spiritual support;Social support  Referral From Nurse  Consult/Referral To Chaplain  Spiritual Encounters  Spiritual Needs Emotional;Grief support  Stress Factors  Patient Stress Factors Exhausted;Health changes;Financial concerns;Lack of caregivers;Lack of knowledge;Loss of control;Major life changes  Family Stress Factors  (no living immediate family)

## 2018-11-18 NOTE — Progress Notes (Signed)
[facility] [place] NAME: Westly Hinnant    MR#:  [id number]  DATE OF BIRTH:  Jun 27, 1955  SUBJECTIVE:  CHIEF COMPLAINT:   Chief Complaint  Patient presents with  . Weakness  Seen today Awake and responds to verbal commands Is more alert Tolerating diet okay No fever Renal stents could not be placed yesterday by urology Cystoscopy done REVIEW OF SYSTEMS:  Review of Systems  Constitutional: Positive for malaise/fatigue and weight loss. Negative for diaphoresis and fever.  HENT: Negative for ear discharge, ear pain, hearing loss, nosebleeds, sore throat and tinnitus.   Eyes: Negative for blurred vision and pain.  Respiratory: Negative for cough, hemoptysis, shortness of breath and wheezing.   Cardiovascular: Negative for chest pain, palpitations, orthopnea and leg swelling.  Gastrointestinal: Negative for abdominal pain, blood in stool, constipation, diarrhea, heartburn, nausea and vomiting.  Genitourinary: Negative for dysuria, frequency and urgency.  Musculoskeletal: Negative for back pain and myalgias.  Skin: Negative for itching and rash.  Neurological: Negative for dizziness, tingling, tremors, focal weakness, seizures, weakness and headaches.  Psychiatric/Behavioral: Negative for depression. The patient is not nervous/anxious.    DRUG ALLERGIES:  No Known Allergies VITALS:  Blood pressure (!) 89/60, pulse 79, temperature 97.6 F (36.4 C), temperature source Oral, resp. rate 14, height 5\' 10"  (1.778 m), weight 76.5 kg, SpO2 99 %. PHYSICAL EXAMINATION:  Physical Exam Constitutional:      Appearance: He is cachectic. He is ill-appearing.  HENT:     Head: Normocephalic and atraumatic.  Eyes:     Conjunctiva/sclera: Conjunctivae normal.     Pupils: Pupils are equal, round, and reactive to light.  Neck:     Musculoskeletal: Normal range of motion and neck supple.     Thyroid: No thyromegaly.     Trachea: No tracheal  deviation.  Cardiovascular:     Rate and Rhythm: Normal rate and regular rhythm.     Heart sounds: Normal heart sounds.  Pulmonary:     Effort: Pulmonary effort is normal. No respiratory distress.     Breath sounds: Normal breath sounds. No wheezing.  Chest:     Chest wall: No tenderness.  Abdominal:     General: Bowel sounds are normal. There is no distension.     Palpations: Abdomen is soft.     Tenderness: There is no abdominal tenderness.  Musculoskeletal: Normal range of motion.  Skin:    General: Skin is warm and dry.     Findings: No rash.  Neurological:     Mental Status: He is alert and oriented to person, place, and time.     Cranial Nerves: No cranial nerve deficit.    LABORATORY PANEL:  Male CBC Recent Labs  Lab 11/18/18 0532  WBC 14.1*  HGB 8.1*  HCT 25.2*  PLT 276   ------------------------------------------------------------------------------------------------------------------ Chemistries  Recent Labs  Lab 11/18/18 0532  NA 141  K 3.6  CL 103  CO2 27  GLUCOSE 144*  BUN 60*  CREATININE 3.32*  CALCIUM 10.4*   RADIOLOGY:  Dg Or Urology Cysto Image ([facility] Only)  Result Date: 11/17/2018 There is no interpretation for this exam.  This order is for images obtained during a surgical procedure.  Please See "Surgeries" Tab for more information regarding the procedure.   ASSESSMENT AND PLAN:  63 year old male patient with a known history of hip fracture, chronic congestive heart failure, CKD stage III, hypertension, chronic afib on  eliquis was referred from group home.  Patient recently had hip fracture which was repaired and sent to rehab.  Day ago patient was sent from rehab group home.  He has generalized weakness.    * Acute hypercalcemia and significant unintentional weight loss Improved Current calcium is 11.7 Due to malignancy -Improved some with IV fluid hydration with normal saline Follow-up calcium levels  *Bladder mass High suspicion for  urothelial malignancy metastaic -Has bilateral hydronephrosis -Appreciate urology evaluation -Cystoscopy done yesterday -Renal stents could not be placed -Urology to reevaluate for nephrostomy tube placement -Follow-up with urology -Appreciate nephrology follow-up -Follow-up with family -Appreciate oncology evaluation -Discussions ongoing regarding chemotherapy and immunotherapy with the patient and family -palliative care consult on board  *Sepsis Secondary to MRSA bacteremia IV vancomycin antibiotic on board WBC count improving Echocardiogram reviewed EF 55 to 60% Cardiology consult for transesophageal echocardiogram  * Dehydration -Continue IV fluids  -Hypo-tension secondary to dehydration Improving IV fluids and recheck blood pressure Hold metoprolol  -History of hip fracture status post ORIF Physical therapy to continue  -Acute on chronic kidney disease stage III -creatinine at 3.32 today.  Likely postobstructive with some prerenal component -Monitor while on IV fluids Avoid nephrotoxic meds -Nephrology input appreciated  -DVT prophylaxis On anticoagulation with Eliquis  -Chronic Afib Eliquis on hold as patient had procedures with urology  -Tobacco abuse Tobacco cessation counseled for six minutes by admitting doctor Nicotine patch offered, but patient did not want it.  -Covid 19 test negative  -Ambulatory dysfunction Physical therapy evaluation  Overall very poor prognosis. 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 Management plans discussed with the patient, nursing and they are in agreement.  CODE STATUS: Full Code  TOTAL TIME TAKING CARE OF THIS PATIENT: 35 minutes More than 50% of the time was spent in counseling/coordination of care: YES  POSSIBLE D/C IN 2-3 DAYS, DEPENDING ON CLINICAL CONDITION.   Saundra Shelling  M.D on 11/18/2018 at 11:48 AM  Between 7am to 6pm - Pager - [phone]  After 6pm go to [email or web address] - Proofreader  [facility]  Office  [phone]  CC: Primary care physician; System, Pcp Not In  Note: This dictation was prepared with Dragon dictation along with smaller phrase technology. Any transcriptional errors that result from this process are unintentional.

## 2018-11-19 ENCOUNTER — Encounter
Admission: EM | Disposition: A | Discharge status: Skilled Nursing Facility | Payer: Self-pay | Source: Home / Self Care | Attending: Internal Medicine

## 2018-11-19 ENCOUNTER — Telehealth: Payer: Self-pay

## 2018-11-19 DIAGNOSIS — C679 Malignant neoplasm of bladder, unspecified: Secondary | ICD-10-CM

## 2018-11-19 DIAGNOSIS — Z600 Problems of adjustment to life-cycle transitions: Secondary | ICD-10-CM

## 2018-11-19 DIAGNOSIS — Z960 Presence of urogenital implants: Secondary | ICD-10-CM

## 2018-11-19 LAB — BASIC METABOLIC PANEL
Anion gap: 10 (ref 5–15)
BUN: 51 mg/dL — ABNORMAL HIGH (ref 8–23)
CO2: 30 mmol/L (ref 22–32)
Calcium: 9.9 mg/dL (ref 8.9–10.3)
Chloride: 97 mmol/L — ABNORMAL LOW (ref 98–111)
Creatinine, Ser: 2.57 mg/dL — ABNORMAL HIGH (ref 0.61–1.24)
GFR calc Af Amer: 30 mL/min — ABNORMAL LOW (ref >60)
GFR calc non Af Amer: 26 mL/min — ABNORMAL LOW (ref >60)
Glucose, Bld: 152 mg/dL — ABNORMAL HIGH (ref 70–99)
Potassium: 2.9 mmol/L — ABNORMAL LOW (ref 3.5–5.1)
Sodium: 137 mmol/L (ref 135–145)

## 2018-11-19 LAB — CBC
HCT: 26.7 % — ABNORMAL LOW (ref 39.0–52.0)
Hemoglobin: 8.5 g/dL — ABNORMAL LOW (ref 13.0–17.0)
MCH: 28.2 pg (ref 26.0–34.0)
MCHC: 31.8 g/dL (ref 30.0–36.0)
MCV: 88.7 fL (ref 80.0–100.0)
Platelets: 289 10*3/uL (ref 150–400)
RBC: 3.01 MIL/uL — ABNORMAL LOW (ref 4.22–5.81)
RDW: 15.4 % (ref 11.5–15.5)
WBC: 13 10*3/uL — ABNORMAL HIGH (ref 4.0–10.5)
nRBC: 0 % (ref 0.0–0.2)

## 2018-11-19 SURGERY — ECHOCARDIOGRAM, TRANSESOPHAGEAL
Anesthesia: Moderate Sedation

## 2018-11-19 MED ORDER — POTASSIUM CHLORIDE CRYS ER 20 MEQ PO TBCR
40.0000 meq | EXTENDED_RELEASE_TABLET | Freq: Once | ORAL | Status: AC
  Administered 2018-11-19: 40 meq via ORAL
  Filled 2018-11-19: qty 2

## 2018-11-19 MED ORDER — APIXABAN 5 MG PO TABS
5.0000 mg | ORAL_TABLET | Freq: Two times a day (BID) | ORAL | Status: DC
  Administered 2018-11-19 – 2018-11-23 (×8): 5 mg via ORAL
  Filled 2018-11-19 (×8): qty 1

## 2018-11-19 NOTE — TOC Progression Note (Signed)
Transition of Care [facility]) - Progression Note    Patient Details  Name: Ruben Clark MRN: [id number] Date of Birth: [birth year]  [facility] CM/SW Contact  Ross Ludwig, [place] Phone Number: 11/19/2018, 4:23 PM  Clinical Narrative:     CSW continuing to follow patient's progress throughout discharge planning.  Patient was considering switching to comfort care, but now patient has updated palliative that he would like to continue to live, and try to make progress to improve his health.  Patient has a legal guardian, whom palliative was able to speak to, patient's legal guardian is supportive of whatever patient wants to do.  CSW has worked up for patient to go to SNF for short term rehab.  CSW continuing to follow patient's progress.     Barriers to Discharge: Other (comment)(Unsure if [facility] can take him back. Do not know if he has medicaid for long term care.  May need higher level)  Expected Discharge Plan and Services         Living arrangements for the past 2 months: [place], [place] Expected Discharge Date: 11/12/18                                     Social Determinants of Health (SDOH) Interventions    Readmission Risk Interventions Readmission Risk Prevention Plan 10/10/2018  Post Dischage Appt Complete  Medication Screening Complete  Transportation Screening Complete

## 2018-11-19 NOTE — Progress Notes (Addendum)
ID Doing better Alert No fever No pain No diarrhea No cough   Patient Vitals for the past 24 hrs:  BP Temp Temp src Pulse Resp SpO2 Weight  11/19/18 0753 92/60 97.7 F (36.5 C) - 83 18 98 % -  11/19/18 0329 117/82 98 F (36.7 C) Oral 88 20 99 % 78.6 kg  11/18/18 1932 111/67 (!) 97.4 F (36.3 C) Oral 61 20 99 % -  11/18/18 1529 103/77 (!) 97.5 F (36.4 C) Oral 63 18 100 % -  o/e Awake, alert  Chest B/l air entry Hs -irregular Abd soft- mass palpable suprapubic area Has foley which has blood stained urine ( as expected) CNS non focal  CBC Latest Ref Rng & Units 11/19/2018 11/18/2018 11/17/2018  WBC 4.0 - 10.5 K/uL 13.0(H) 14.1(H) 23.1(H)  Hemoglobin 13.0 - 17.0 g/dL 8.5(L) 8.1(L) 8.3(L)  Hematocrit 39.0 - 52.0 % 26.7(L) 25.2(L) 25.5(L)  Platelets 150 - 400 K/uL 289 276 329    CMP Latest Ref Rng & Units 11/19/2018 11/18/2018 11/17/2018  Glucose 70 - 99 mg/dL 152(H) 144(H) 70  BUN 8 - 23 mg/dL 51(H) 60(H) 57(H)  Creatinine 0.61 - 1.24 mg/dL 2.57(H) 3.32(H) 3.83(H)  Sodium 135 - 145 mmol/L 137 141 137  Potassium 3.5 - 5.1 mmol/L 2.9(L) 3.6 3.6  Chloride 98 - 111 mmol/L 97(L) 103 103  CO2 22 - 32 mmol/L 30 27 24   Calcium 8.9 - 10.3 mg/dL 9.9 10.4(H) 11.1(H)  Total Protein 6.5 - 8.1 g/dL - - -  Total Bilirubin 0.3 - 1.2 mg/dL - - -  Alkaline Phos 38 - 126 U/L - - -  AST 15 - 41 U/L - - -  ALT 0 - 44 U/L - - -    Blood culture MRSA from 11/14/18 11/16/18-Ng so far  Impression recommendation MRSA bacteremia Source is urinary tract from the bladder tumor. Repeat blood culture from 8/11 no growth so far 2D echo did not show any specific vegetations. Patient does not want TEE. Currently on vancomycin. Levels  being monitored bu pharmacist and dose adjusted accordingly .  Will  need for 4-6 weeks.  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  Underwent TURBT on 11/17/2018.  Some tumor resected and bladder was washed out.   WBC is decreasing since then.  Creatinine is also improving.  As the ureters could not be stented the plan was to do nephrostomy bilateral. As urine output is good may repeat US/CT before that  Afib on digoxin and metoprolol  Hypercalcemia resolved  Metabolic encephalopathy resolved  Followed by heme-onc AKI on CKD due to the obstruction improving  Leukocytosis due to tumor burden obstruction improving.  ID will follow him peripherally this weekend- call if needed

## 2018-11-19 NOTE — Progress Notes (Signed)
[place], [place] 11/19/18  Subjective:  It appears that the patient's renal function is improving. Creatinine down to 2.6. Appears to be eating much better.   Objective:  Vital signs in last 24 hours:  Temp:  [97.4 F (36.3 C)-98 F (36.7 C)] 97.7 F (36.5 C) (08/14 0753) Pulse Rate:  [61-88] 83 (08/14 0753) Resp:  [18-20] 18 (08/14 0753) BP: (92-117)/(60-82) 92/60 (08/14 0753) SpO2:  [98 %-100 %] 98 % (08/14 0753) Weight:  [78.6 kg] 78.6 kg (08/14 0329)  Weight change: 1.406 kg Filed Weights   11/17/18 1057 11/18/18 0540 11/19/18 0329  Weight: 77.2 kg 76.5 kg 78.6 kg    Intake/Output:    Intake/Output Summary (Last 24 hours) at 11/19/2018 1201 Last data filed at 11/19/2018 1016 Gross per 24 hour  Intake -  Output 2700 ml  Net -2700 ml     Physical Exam: General:  Thin, cachectic gentleman, laying in the bed  HEENT  anicteric, moist oral mucous membranes  Neck  supple  Pulm/lungs  clear to auscultation, normal effort  CVS/Heart  no rub, S1-S2  Abdomen:   soft, BS present  Extremities:  No edema  Neurologic:  Alert, oriented to person/place  Skin:  Dry skin    Basic Metabolic Panel:  Recent Labs  Lab 11/15/18 0455 11/16/18 0012 11/17/18 0639 11/18/18 0532 11/19/18 0757  NA 142 138 137 141 137  K 3.6 3.6 3.6 3.6 2.9*  CL 115* 107 103 103 97*  CO2 20* 23 24 27 30   GLUCOSE 96 98 70 144* 152*  BUN 49* 53* 57* 60* 51*  CREATININE 3.00* 3.25* 3.83* 3.32* 2.57*  CALCIUM 12.0* 11.7* 11.1* 10.4* 9.9     CBC: Recent Labs  Lab 11/15/18 0455 11/16/18 0012 11/17/18 0639 11/18/18 0532 11/19/18 0757  WBC 21.5* 22.9* 23.1* 14.1* 13.0*  HGB 9.3* 9.6* 8.3* 8.1* 8.5*  HCT 30.3* 29.7* 25.5* 25.2* 26.7*  MCV 93.8 89.5 88.2 88.4 88.7  PLT 383 404* 329 276 289      Lab Results  Component Value Date   HEPBSAG Negative 11/11/2018   HEPBSAB Non Reactive 11/11/2018      Microbiology:  Recent Results (from the past 240  hour(s))  SARS Coronavirus 2 [facility] order, Performed in [facility] hospital lab) Nasopharyngeal Nasopharyngeal Swab     Status: None   Collection Time: 11/10/18  6:40 PM   Specimen: Nasopharyngeal Swab  Result Value Ref Range Status   SARS Coronavirus 2 NEGATIVE NEGATIVE Final    Comment: (NOTE) If result is NEGATIVE SARS-CoV-2 target nucleic acids are NOT DETECTED. The SARS-CoV-2 RNA is generally detectable in upper and lower  respiratory specimens during the acute phase of infection. The lowest  concentration of SARS-CoV-2 viral copies this assay can detect is 250  copies / mL. A negative result does not preclude SARS-CoV-2 infection  and should not be used as the sole basis for treatment or other  patient management decisions.  A negative result may occur with  improper specimen collection / handling, submission of specimen other  than nasopharyngeal swab, presence of viral mutation(s) within the  areas targeted by this assay, and inadequate number of viral copies  (<250 copies / mL). A negative result must be combined with clinical  observations, patient history, and epidemiological information. If result is POSITIVE SARS-CoV-2 target nucleic acids are DETECTED. The SARS-CoV-2 RNA is generally detectable in upper and lower  respiratory specimens dur ing the acute phase of infection.  Positive  results are indicative of active infection with SARS-CoV-2.  Clinical  correlation with patient history and other diagnostic information is  necessary to determine patient infection status.  Positive results do  not rule out bacterial infection or co-infection with other viruses. If result is PRESUMPTIVE POSTIVE SARS-CoV-2 nucleic acids MAY BE PRESENT.   A presumptive positive result was obtained on the submitted specimen  and confirmed on repeat testing.  While 2019 novel coronavirus  (SARS-CoV-2) nucleic acids may be present in the submitted sample  additional confirmatory testing  may be necessary for epidemiological  and / or clinical management purposes  to differentiate between  SARS-CoV-2 and other Sarbecovirus currently known to infect humans.  If clinically indicated additional testing with an alternate test  methodology [phone]) is advised. The SARS-CoV-2 RNA is generally  detectable in upper and lower respiratory sp ecimens during the acute  phase of infection. The expected result is Negative. Fact Sheet for Patients:  StrictlyIdeas.no Fact Sheet for Healthcare Providers: BankingDealers.co.za This test is not yet approved or cleared by the [place] FDA and has been authorized for detection and/or diagnosis of SARS-CoV-2 by FDA under an Emergency Use Authorization (EUA).  This EUA will remain in effect (meaning this test can be used) for the duration of the COVID-19 declaration under Section 564(b)(1) of the Act, 21 U.S.C. section 360bbb-3(b)(1), unless the authorization is terminated or revoked sooner. Performed at [facility], [address] [place], [place] [address]   Urine Culture     Status: Abnormal   Collection Time: 11/13/18 11:21 PM   Specimen: Urine, Random  Result Value Ref Range Status   Specimen Description   Final    URINE, RANDOM Performed at [facility], [address] [place], [place] [address]    Special Requests   Final    NONE Performed at [facility], [address] [place], [place] [address]    Culture (A)  Final    >=100,000 COLONIES/mL METHICILLIN RESISTANT STAPHYLOCOCCUS AUREUS   Report Status 11/17/2018 FINAL  Final   Organism ID, Bacteria METHICILLIN RESISTANT STAPHYLOCOCCUS AUREUS (A)  Final      Susceptibility   Methicillin resistant staphylococcus aureus - MIC*    CIPROFLOXACIN >=8 RESISTANT Resistant     GENTAMICIN <=0.5 SENSITIVE Sensitive     NITROFURANTOIN <=16 SENSITIVE Sensitive     OXACILLIN >=4 RESISTANT Resistant      TETRACYCLINE <=1 SENSITIVE Sensitive     VANCOMYCIN 1 SENSITIVE Sensitive     TRIMETH/SULFA <=10 SENSITIVE Sensitive     CLINDAMYCIN >=8 RESISTANT Resistant     RIFAMPIN <=0.5 SENSITIVE Sensitive     Inducible Clindamycin NEGATIVE Sensitive     * >=100,000 COLONIES/mL METHICILLIN RESISTANT STAPHYLOCOCCUS AUREUS  CULTURE, BLOOD (ROUTINE X 2) w Reflex to ID Panel     Status: Abnormal   Collection Time: 11/14/18 10:53 AM   Specimen: BLOOD  Result Value Ref Range Status   Specimen Description   Final    BLOOD  L FOREARM Performed at [facility], [address] [place], [place] [address]    Special Requests   Final    BOTTLES DRAWN AEROBIC AND ANAEROBIC Blood Culture adequate volume Performed at [facility], [address] [place], [place] [address]    Culture  Setup Time   Final    GRAM POSITIVE COCCI AEROBIC BOTTLE ONLY CRITICAL VALUE NOTED.  VALUE IS CONSISTENT WITH PREVIOUSLY REPORTED AND CALLED VALUE. Performed at [facility], [address] [place], [place] [address]    Culture (A)  Final    STAPHYLOCOCCUS AUREUS SUSCEPTIBILITIES PERFORMED ON PREVIOUS CULTURE WITHIN THE LAST 5 DAYS. Performed at [facility], [place] [address] [place], [place] [address]    Report Status 11/17/2018 FINAL  Final  CULTURE, BLOOD (ROUTINE X 2) w Reflex to ID Panel     Status: Abnormal   Collection Time: 11/14/18 11:04 AM   Specimen: BLOOD  Result Value Ref Range Status   Specimen Description   Final    BLOOD L HAND Performed at [facility], [address] [place], [place] [address]    Special Requests   Final    BOTTLES DRAWN AEROBIC AND ANAEROBIC Blood Culture adequate volume Performed at [facility], [address], [place], [place] [address]    Culture  Setup Time   Final    GRAM POSITIVE COCCI IN BOTH AEROBIC AND ANAEROBIC BOTTLES CRITICAL RESULT CALLED TO, READ BACK BY AND VERIFIED WITH: CHARLES SHANLEVER AT 7782 ON  11/15/2018 [place]. Performed at [facility], [place] [address] [place], [place] [address]    Culture METHICILLIN RESISTANT STAPHYLOCOCCUS AUREUS (A)  Final   Report Status 11/17/2018 FINAL  Final   Organism ID, Bacteria METHICILLIN RESISTANT STAPHYLOCOCCUS AUREUS  Final      Susceptibility   Methicillin resistant staphylococcus aureus - MIC*    CIPROFLOXACIN >=8 RESISTANT Resistant     ERYTHROMYCIN >=8 RESISTANT Resistant     GENTAMICIN <=0.5 SENSITIVE Sensitive     OXACILLIN >=4 RESISTANT Resistant     TETRACYCLINE <=1 SENSITIVE Sensitive     VANCOMYCIN 1 SENSITIVE Sensitive     TRIMETH/SULFA <=10 SENSITIVE Sensitive     CLINDAMYCIN >=8 RESISTANT Resistant     RIFAMPIN <=0.5 SENSITIVE Sensitive     Inducible Clindamycin NEGATIVE Sensitive     * METHICILLIN RESISTANT STAPHYLOCOCCUS AUREUS  Blood Culture ID Panel (Reflexed)     Status: Abnormal   Collection Time: 11/14/18 11:04 AM  Result Value Ref Range Status   Enterococcus species NOT DETECTED NOT DETECTED Final   Listeria monocytogenes NOT DETECTED NOT DETECTED Final   Staphylococcus species DETECTED (A) NOT DETECTED Final    Comment: CRITICAL RESULT CALLED TO, READ BACK BY AND VERIFIED WITH: CHARLES SHANLEVER AT 6144 ON 11/15/2018 [place].    Staphylococcus aureus (BCID) DETECTED (A) NOT DETECTED Final    Comment: Methicillin (oxacillin)-resistant Staphylococcus aureus (MRSA). MRSA is predictably resistant to beta-lactam antibiotics (except ceftaroline). Preferred therapy is vancomycin unless clinically contraindicated. Patient requires contact precautions if  hospitalized. CRITICAL RESULT CALLED TO, READ BACK BY AND VERIFIED WITH: CHARLES SHANLEVER AT 3154 ON 11/15/2018 [place].    Methicillin resistance DETECTED (A) NOT DETECTED Final    Comment: CRITICAL RESULT CALLED TO, READ BACK BY AND VERIFIED WITH: CHARLES SHANLEVER AT [phone] ON 11/15/2018 [place].    Streptococcus species NOT DETECTED NOT DETECTED Final   Streptococcus agalactiae  NOT DETECTED NOT DETECTED Final   Streptococcus pneumoniae NOT DETECTED NOT DETECTED Final   Streptococcus pyogenes NOT DETECTED NOT DETECTED Final   Acinetobacter baumannii NOT DETECTED NOT DETECTED Final   Enterobacteriaceae species NOT DETECTED NOT DETECTED Final   Enterobacter cloacae complex NOT DETECTED NOT DETECTED Final   Escherichia coli NOT DETECTED NOT DETECTED Final   Klebsiella oxytoca NOT DETECTED NOT DETECTED Final   Klebsiella pneumoniae NOT DETECTED NOT DETECTED Final   Proteus species NOT DETECTED NOT DETECTED Final   Serratia marcescens NOT DETECTED NOT DETECTED Final  Haemophilus influenzae NOT DETECTED NOT DETECTED Final   Neisseria meningitidis NOT DETECTED NOT DETECTED Final   Pseudomonas aeruginosa NOT DETECTED NOT DETECTED Final   Candida albicans NOT DETECTED NOT DETECTED Final   Candida glabrata NOT DETECTED NOT DETECTED Final   Candida krusei NOT DETECTED NOT DETECTED Final   Candida parapsilosis NOT DETECTED NOT DETECTED Final   Candida tropicalis NOT DETECTED NOT DETECTED Final    Comment: Performed at [facility], [address] [place], [place] [address]  CULTURE, BLOOD (ROUTINE X 2) w Reflex to ID Panel     Status: None (Preliminary result)   Collection Time: 11/16/18 12:05 AM   Specimen: BLOOD  Result Value Ref Range Status   Specimen Description BLOOD LEFT ASSIST CONTROL  Final   Special Requests   Final    BOTTLES DRAWN AEROBIC AND ANAEROBIC Blood Culture results may not be optimal due to an excessive volume of blood received in culture bottles   Culture   Final    NO GROWTH 3 DAYS Performed at [facility], [address], [place], [place] [address]    Report Status PENDING  Incomplete  CULTURE, BLOOD (ROUTINE X 2) w Reflex to ID Panel     Status: None (Preliminary result)   Collection Time: 11/16/18 12:11 AM   Specimen: BLOOD  Result Value Ref Range Status   Specimen Description BLOOD LEFT HAND  Final   Special Requests    Final    BOTTLES DRAWN AEROBIC AND ANAEROBIC Blood Culture adequate volume   Culture   Final    NO GROWTH 3 DAYS Performed at [facility], [address] [place],  [address]    Report Status PENDING  Incomplete    Coagulation Studies: No results for input(s): LABPROT, INR in the last 72 hours.  Urinalysis: No results for input(s): COLORURINE, LABSPEC, PHURINE, GLUCOSEU, HGBUR, BILIRUBINUR, KETONESUR, PROTEINUR, UROBILINOGEN, NITRITE, LEUKOCYTESUR in the last 72 hours.  Invalid input(s): APPERANCEUR    Imaging: Dg Or Urology Cysto Image ([facility] Only)  Result Date: 11/17/2018 There is no interpretation for this exam.  This order is for images obtained during a surgical procedure.  Please See "Surgeries" Tab for more information regarding the procedure.     Medications:   .  sodium bicarbonate (isotonic) infusion in sterile water 100 mL/hr at 11/19/18 0148   . aspirin EC  81 mg Oral Daily  . digoxin  0.25 mg Oral Daily  . docusate sodium  100 mg Oral BID  . finasteride  5 mg Oral Daily  . folic acid  1 mg Oral Daily  . gabapentin  300 mg Oral BID  . loratadine  10 mg Oral Daily  . LORazepam  0.5 mg Oral Daily  . metoprolol succinate  12.5 mg Oral Daily  . potassium chloride  40 mEq Oral Once  . potassium chloride  40 mEq Oral Once  . vancomycin variable dose per unstable renal function (pharmacist dosing)   Does not apply See admin instructions   acetaminophen **OR** acetaminophen, bisacodyl, hydrOXYzine, ondansetron **OR** ondansetron (ZOFRAN) IV, senna, traZODone  Assessment/ Plan:  63 y.o. male with medical problems of ch sys CHF, CKD, A Fib, HTN who is a resident of group home is admitted to [facility] on 11/10/2018 for evaluation of weakness, weight loss. Had left femoral neck fracture in 10/2018  1. AKI, bilateral hydronephrosis 2. Hypercalcemia 3. Weight loss, cachexia 4. Anemia 5.  Bladder mass 6.  Metabolic acidosis.  Plan: Patient underwent  TURBT earlier in the week however ureteral stents could not be passed.  Despite this his creatinine appears to be coming down and is currently 2.6.  However patient has decided against additional aggressive treatment including chemotherapy.  Therefore it appears that he will be proceeding with comfort care.  This certainly appears to be a reasonable course of action.  Therefore no further renal input.  Thanks for allowing [place] to participate.  LOS: 9 Maizie Garno 8/14/202012:01 PM  [place], [place]  Note: This note was prepared with Dragon dictation. Any transcription errors are unintentional

## 2018-11-19 NOTE — Progress Notes (Signed)
Pharmacy Antibiotic Note  Ruben Clark is a 63 y.o. male admitted on 11/10/2018 with sepsis.  Pharmacy has been consulted for Vancomycin. Cefepime was discontinued d/t blood cultures. However, it was briefly restarted d/t concerns of his blood pressure being low and leucocytosis. After bladder wash out and release of purulent fluid, WBC has down trended.  Vanc random 18 on 11/18/2018. Vancomycin 1 g x dose was ordered and given on 8/13 @ 2158.  Patient's renal function has improved from yesterday, yet may still need dosing based on Vanc randoms and closely monitoring renal function.    Plan:  Pharmacist to follow renal function and reassess vancomycin dosing as indicated. Will follow Vanc Random and creatinine tomorrow.    Height: 5\' 10"  (177.8 cm) Weight: 173 lb 4.8 oz (78.6 kg) IBW/kg (Calculated) : 73  Temp (24hrs), Avg:97.7 F (36.5 C), Min:97.4 F (36.3 C), Max:98 F (36.7 C)  Recent Labs  Lab 11/14/18 1053 11/15/18 0455 11/16/18 0012 11/16/18 1307 11/17/18 0639 11/18/18 0532 11/18/18 1822 11/19/18 0757  WBC  --  21.5* 22.9*  --  23.1* 14.1*  --  13.0*  CREATININE  --  3.00* 3.25*  --  3.83* 3.32*  --  2.57*  LATICACIDVEN 1.8  --   --   --   --   --   --   --   VANCORANDOM  --   --   --  15  --   --  18  --     Estimated Creatinine Clearance: 30.8 mL/min (A) (by C-G formula based on SCr of 2.57 mg/dL (H)).    Allergies  Allergen Reactions  . 5-Alpha Reductase Inhibitors     Antimicrobials this admission: Vancomycin  8/9 >>  Cefepime 8/9 >> 8/10; 8/12 >> 8/13  Microbiology: 8/8 Urine: MRSA 8/9 Blood: MRSA 8/11 Blood: NG 2 days  Thank you for allowing pharmacy to be a part of this patient's care.  Kristeen Miss, PharmD Clinical Pharmacist 11/19/2018 10:17 AM

## 2018-11-19 NOTE — Telephone Encounter (Signed)
[employer] requisition form faxed. Confirmed received.

## 2018-11-19 NOTE — Progress Notes (Signed)
[place] at [place] NAME: Tedric Leeth    MR#:  [id number]  DATE OF BIRTH:  11-Jul-1955  SUBJECTIVE:  CHIEF COMPLAINT:   Chief Complaint  Patient presents with  . Weakness  Seen today Awake and responds to verbal commands Is more alert Tolerating diet okay No fever Renal stents could not be placed by urology Cystoscopy done and biopsy done REVIEW OF SYSTEMS:  Review of Systems  Constitutional: Positive for malaise/fatigue and weight loss. Negative for diaphoresis and fever.  HENT: Negative for ear discharge, ear pain, hearing loss, nosebleeds, sore throat and tinnitus.   Eyes: Negative for blurred vision and pain.  Respiratory: Negative for cough, hemoptysis, shortness of breath and wheezing.   Cardiovascular: Negative for chest pain, palpitations, orthopnea and leg swelling.  Gastrointestinal: Negative for abdominal pain, blood in stool, constipation, diarrhea, heartburn, nausea and vomiting.  Genitourinary: Negative for dysuria, frequency and urgency.  Musculoskeletal: Negative for back pain and myalgias.  Skin: Negative for itching and rash.  Neurological: Negative for dizziness, tingling, tremors, focal weakness, seizures, weakness and headaches.  Psychiatric/Behavioral: Negative for depression. The patient is not nervous/anxious.    DRUG ALLERGIES:   Allergies  Allergen Reactions  . 5-Alpha Reductase Inhibitors    VITALS:  Blood pressure 92/60, pulse 83, temperature 97.7 F (36.5 C), resp. rate 18, height 5\' 10"  (1.778 m), weight 78.6 kg, SpO2 98 %. PHYSICAL EXAMINATION:  Physical Exam Constitutional:      Appearance: He is cachectic. He is ill-appearing.  HENT:     Head: Normocephalic and atraumatic.  Eyes:     Conjunctiva/sclera: Conjunctivae normal.     Pupils: Pupils are equal, round, and reactive to light.  Neck:     Musculoskeletal: Normal range of motion and neck supple.     Thyroid: No thyromegaly.     Trachea: No tracheal deviation.  Cardiovascular:     Rate and Rhythm: Normal rate and regular rhythm.     Heart sounds: Normal heart sounds.  Pulmonary:     Effort: Pulmonary effort is normal. No respiratory distress.     Breath sounds: Normal breath sounds. No wheezing.  Chest:     Chest wall: No tenderness.  Abdominal:     General: Bowel sounds are normal. There is no distension.     Palpations: Abdomen is soft.     Tenderness: There is no abdominal tenderness.  Musculoskeletal: Normal range of motion.  Skin:    General: Skin is warm and dry.     Findings: No rash.  Neurological:     Mental Status: He is alert and oriented to person, place, and time.     Cranial Nerves: No cranial nerve deficit.    LABORATORY PANEL:  Male CBC Recent Labs  Lab 11/19/18 0757  WBC 13.0*  HGB 8.5*  HCT 26.7*  PLT 289   ------------------------------------------------------------------------------------------------------------------ Chemistries  Recent Labs  Lab 11/19/18 0757  NA 137  K 2.9*  CL 97*  CO2 30  GLUCOSE 152*  BUN 51*  CREATININE 2.57*  CALCIUM 9.9   RADIOLOGY:  No results found. ASSESSMENT AND PLAN:  63 year old male patient with a known history of hip fracture, chronic congestive heart failure, CKD stage III, hypertension, chronic afib on eliquis was referred from group home.  Patient recently had hip fracture which was repaired and sent to rehab.  Day ago patient was sent from rehab group home.  He has  generalized weakness.    * Acute hypercalcemia and significant unintentional weight loss Improved Current calcium is 9 Due to malignancy  *Bladder mass High suspicion for urothelial malignancy metastaic -Has bilateral hydronephrosis -Appreciate urology evaluation -Cystoscopy done yesterday -Renal stents could not be placed -Urology to reevaluate for nephrostomy tube placement -Follow-up with urology -Appreciate nephrology follow-up -Follow-up with family  -Appreciate oncology evaluation -Discussions ongoing regarding chemotherapy and immunotherapy with the patient and family -palliative care consult on board and needs follow-up -Discussed with patient regarding goals of care and his inclination towards chemotherapy and any surgical intervention for nephrostomy tubes  *Sepsis Secondary to MRSA bacteremia IV vancomycin antibiotic on board WBC count improving Echocardiogram reviewed EF 55 to 60% Cardiology consulted for transesophageal echocardiogram, patient refused therapy  *Acute hypokalemia Replace potassium  * Dehydration improved -Continue IV fluids  -Hypo-tension secondary to dehydration Improving IV fluids and recheck blood pressure Hold metoprolol  -History of hip fracture status post ORIF Physical therapy to continue  -Acute on chronic kidney disease stage III -creatinine at 3.32 today.  Likely postobstructive with some prerenal component -Monitor while on IV fluids Avoid nephrotoxic meds -Nephrology input appreciated  -DVT prophylaxis On anticoagulation with Eliquis  -Chronic Afib Will resume Eliquis as no procedures are being done currently  -Tobacco abuse Tobacco cessation counseled for six minutes by admitting doctor Nicotine patch offered, but patient did not want it.  -Covid 19 test negative  -Ambulatory dysfunction Physical therapy evaluation  Overall very poor prognosis. 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 Management plans discussed with the patient, nursing and they are in agreement.  CODE STATUS: Full Code  TOTAL TIME TAKING CARE OF THIS PATIENT: 34 minutes More than 50% of the time was spent in counseling/coordination of care: YES  POSSIBLE D/C IN 2-3 DAYS, DEPENDING ON CLINICAL CONDITION.   Saundra Shelling M.D on 11/19/2018 at 11:49 AM  Between 7am to 6pm -  Pager - [phone]  After 6pm go to [email or web address] - Proofreader  [facility]  Office  [phone]  CC: Primary care physician; System, Pcp Not In  Note: This dictation was prepared with Dragon dictation along with smaller phrase technology. Any transcriptional errors that result from this process are unintentional.

## 2018-11-19 NOTE — Progress Notes (Signed)
Iv access lost in both sites. Pt pulled out the right arm IV by accident. The left arm IV was running well with no issues noted most of the shift. Pt was bathed around 0200 and no issue was noted around the IV site. Around 0330 this nurse noticed that the area around the site had become swollen and has some redness around the IV. The IV fluids were stopped and the IV was removed from pt arm. Pt has no c/o pain in the arm and refused a cold compress when offered. Arm elevated, will continue to monitor.

## 2018-11-19 NOTE — Plan of Care (Addendum)
Patient has no c/o pain but continuously pulls foley catheter out of drainage bag tubing causing urine to get on bed. Educated to leave foley catheter in place and try not to touch as to avoid infection. Urine is cloudy with white clumps.  Leg strap placed for protection.   This RN at bedside with palliative NP and patient expressed wishes to decline hospice and wants measures to "live longer".   Problem: Education: Goal: Knowledge of General Education information will improve Description: Including pain rating scale, medication(s)/side effects and non-pharmacologic comfort measures Outcome: Not Progressing   Problem: Clinical Measurements: Goal: Will remain free from infection Outcome: Not Progressing   Problem: Activity: Goal: Risk for activity intolerance will decrease Outcome: Not Progressing   Problem: Nutrition: Goal: Adequate nutrition will be maintained Outcome: Progressing   Problem: Elimination: Goal: Will not experience complications related to urinary retention Outcome: Progressing   Problem: Safety: Goal: Ability to remain free from injury will improve Outcome: Progressing

## 2018-11-19 NOTE — Progress Notes (Addendum)
Daily Progress Note   Patient Name: Ruben Clark       Date: 11/19/2018 DOB: [birth year]  Age: 63 y.o. MRN#: [id number] Attending Physician: Saundra Shelling, MD Primary Care Physician: System, Pcp Not In Admit Date: 11/10/2018  Reason for Consultation/Follow-up: Psychosocial/spiritual support  Subjective:  Spoke with [name]. Discussed refusal of TEE, procedures that involve tubes in his throat, and again, his feelings on further care and a desire to focus on comfort. Discussed QOL and dignity. Discussed prognosis. Chavis states " I was afraid he would do this, and refuse care, but, it is his human right to refuse these things as a reasonable person could also refuse chemotherapy." He is amenable to hospice and transition to comfort focused care. He would like to look into placement is [facility].   MOST form to be completed for DNR, comfort measures, stopping abx, IVF, and no feeding tube to be faxed to Mr. Joanell Rising [phone] as he is not located in [place].  ADDENDUM:    In to update patient on plan for comfort focused care with hospice. He states he wants to return to his facility and does not wish to go to [facility]. He then states he does not want hospice. Upon further conversation, he states he wants "to try to live a little longer". He states he is willing to have chemotherapy, and any procedure including sedation, GETA, and surgery needed to try to have more time to live. Attempted to explore his change of mind, he states he just wants to try to live longer, he does not want to die yet. Inquired of how he felt about CPR, and he is unsure. Oncology in to see patient at the end of our meeting. He confirms wanting life prolonging treatment for oncology. Discussion of immunotherapy.    Called guardian [name] back to update. He states to let Mr. Lanese know that if he wants available care, this would be okay, but if he declines recommended care again, then they will resume plans for D/C with hospice.     Length of Stay: 9  Current Medications: Scheduled Meds:  . aspirin EC  81 mg Oral Daily  . digoxin  0.25 mg Oral Daily  . docusate sodium  100 mg Oral BID  . finasteride  5 mg Oral Daily  .  folic acid  1 mg Oral Daily  . gabapentin  300 mg Oral BID  . loratadine  10 mg Oral Daily  . LORazepam  0.5 mg Oral Daily  . metoprolol succinate  12.5 mg Oral Daily  . vancomycin variable dose per unstable renal function (pharmacist dosing)   Does not apply See admin instructions    Continuous Infusions: .  sodium bicarbonate (isotonic) infusion in sterile water 100 mL/hr at 11/19/18 0148    PRN Meds: acetaminophen **OR** acetaminophen, bisacodyl, hydrOXYzine, ondansetron **OR** ondansetron (ZOFRAN) IV, senna, traZODone  Physical Exam Pulmonary:     Effort: Pulmonary effort is normal.  Skin:    General: Skin is warm and dry.  Neurological:     Mental Status: He is alert.             Vital Signs: BP 92/60 (BP Location: Right Arm)   Pulse 83   Temp 97.7 F (36.5 C)   Resp 18   Ht 5\' 10"  (1.778 m)   Wt 78.6 kg   SpO2 98%   BMI 24.87 kg/m  SpO2: SpO2: 98 % O2 Device: O2 Device: Room Air O2 Flow Rate: O2 Flow Rate (L/min): 5 L/min  Intake/output summary:   Intake/Output Summary (Last 24 hours) at 11/19/2018 1132 Last data filed at 11/19/2018 1016 Gross per 24 hour  Intake -  Output 2700 ml  Net -2700 ml   LBM: Last BM Date: 11/17/18 Baseline Weight: Weight: 85 kg Most recent weight: Weight: 78.6 kg       Palliative Assessment/Data: 40% at best      Patient Active Problem List   Diagnosis Date Noted  . Hypercalcemia 11/10/2018  . Closed left hip fracture ([place]) 10/19/2018  . Chronic systolic CHF (congestive heart failure) ([place]) 10/19/2018   . HTN (hypertension) 10/19/2018  . Sepsis ([place]) 10/07/2018  . COPD (chronic obstructive pulmonary disease) ([place]) 08/26/2018  . Atrial fibrillation ([place]) 08/26/2018  . Left foot infection 08/02/2018    Palliative Care Assessment & Plan   Recommendations/Plan: Guardian amenable to hospice care.   ADDENDUM:  Patient wants all care possible "to try to live a little longer." Guardian Chavis Gash updated. MOST form for comfort measures NOT sent to Chavis.   Code Status:    Code Status Orders  (From admission, onward)         Start     Ordered   11/10/18 2157  Full code  Continuous     11/10/18 2157        Code Status History    Date Active Date Inactive Code Status Order ID Comments User Context   10/19/2018 0114 10/21/2018 2305 Full Code [id number]  Lance Coon, MD ED   10/07/2018 0442 10/11/2018 1817 Full Code [id number]  Mayer Camel, NP ED   08/02/2018 1502 08/09/2018 1743 Full Code [id number]  Mayo, Pete Pelt, MD Inpatient   Advance Care Planning Activity    Advance Directive Documentation     Most Recent Value  Type of Advance Directive  Healthcare Power of Attorney, Living will  Pre-existing out of facility DNR order (yellow form or pink MOST form)  -  "MOST" Form in Place?  -       Prognosis:  < 2 weeks if decision for comfort focused care: Dehydration. Acute on chronic kidney disease. Untreated hydronephrosis- unable to stent ureters. New diagnosis of extensive bladder tumor/ cancer. MRSA bacteremia. Chronic A-fib- holding Eliquis.  S/P recent ORIF of hip.   Unknown prognosis with full  scope tx.   Discharge Planning:  To Be Determined   Thank you for allowing the Palliative Medicine Team to assist in the care of this patient.   Total Time 11:10-11:30:35 min 12:00-1:02: 62 min  97 min Prolonged Time Billed yes      Greater than 50%  of this time was spent counseling and coordinating care related to the above assessment and plan.  Asencion Gowda, NP   Please contact Palliative Medicine Team phone at [phone] for questions and concerns.

## 2018-11-19 NOTE — Progress Notes (Signed)
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  INTERVAL HISTORY: Patient sitting up in bed eating lunch, offers no specific complaints today.  REVIEW OF SYSTEMS:   Review of Systems  Constitutional: Negative.  Negative for fever, malaise/fatigue and weight loss.  Respiratory: Negative.  Negative for cough and shortness of breath.   Cardiovascular: Negative.  Negative for chest pain and leg swelling.  Gastrointestinal: Negative.  Negative for abdominal pain.  Genitourinary: Negative.  Negative for dysuria.  Musculoskeletal: Negative.  Negative for back pain.  Skin: Negative.  Negative for rash.  Neurological: Negative.  Negative for dizziness, focal weakness, weakness and headaches.  Psychiatric/Behavioral: Negative.  The patient is not nervous/anxious.     As per HPI. Otherwise, a complete review of systems is negative.  PAST MEDICAL HISTORY: Past Medical History:  Diagnosis Date   CHF (congestive heart failure) ([place])    Chronic kidney disease (CKD), stage III (moderate) ([place])    Hypertension     PAST SURGICAL HISTORY: Past Surgical History:  Procedure Laterality Date   AMPUTATION Left 08/03/2018   Procedure: AMPUTATION RAY SECOND TOE;  Surgeon: Sharlotte Alamo, DPM;  Location: [facility] ORS;  Service: Podiatry;  Laterality: Left;   CYSTOSCOPY W/ RETROGRADES Right 11/17/2018   Procedure: CYSTOSCOPY WITH RETROGRADE PYELOGRAM;  Surgeon: Abbie Sons, MD;  Location: [facility] ORS;  Service: Urology;  Laterality: Right;   HIP PINNING,CANNULATED Left 10/19/2018   Procedure: CANNULATED HIP PINNING;  Surgeon: Hessie Knows, MD;  Location: [facility] OR[facility];  Service: Orthopedics;  Laterality: Left;   IRRIGATION AND DEBRIDEMENT FOOT Left 08/05/2018   Procedure: IRRIGATION AND DEBRIDEMENT  FOOT;  Surgeon: Sharlotte Alamo, DPM;  Location: [facility] OR[facility];  Service: Podiatry;  Laterality: Left;   TOE AMPUTATION     TRANSURETHRAL RESECTION OF BLADDER TUMOR N/A 11/17/2018   Procedure: TRANSURETHRAL RESECTION OF BLADDER TUMOR (TURBT);  Surgeon: Abbie Sons, MD;  Location: [facility] OR[facility];  Service: Urology;  Laterality: N/A;    FAMILY HISTORY: History reviewed. No pertinent family history.  ADVANCED DIRECTIVES (Y/N):  @ADVDIR @  HEALTH MAINTENANCE: Social History   Tobacco Use   Smoking status: Current Every Day Smoker   Smokeless tobacco: Never Used  Substance Use Topics   Alcohol use: Never    Frequency: Never   Drug use: Never     Colonoscopy:  PAP:  Bone density:  Lipid panel:  Allergies  Allergen Reactions   5-Alpha Reductase Inhibitors     Current Facility-Administered Medications  Medication Dose Route Frequency Provider Last Rate Last Dose   acetaminophen (TYLENOL) tablet 650 mg  650 mg Oral Q6H PRN Pyreddy, Reatha Harps, MD   650 mg at 11/18/18 2000   Or   acetaminophen (TYLENOL) suppository 650 mg  650 mg Rectal Q6H PRN Pyreddy, Reatha Harps, MD       apixaban (ELIQUIS) tablet 5 mg  5 mg Oral BID Pyreddy, Reatha Harps, MD       aspirin EC tablet 81 mg  81 mg Oral Daily Pyreddy, Pavan, MD   81 mg at 11/19/18 0834   bisacodyl (DULCOLAX) EC tablet 5 mg  5 mg Oral Daily PRN Saundra Shelling, MD       digoxin (LANOXIN) tablet 0.25 mg  0.25 mg Oral Daily Pyreddy, Pavan, MD   0.25 mg at 11/19/18 0834   docusate sodium (COLACE)  capsule 100 mg  100 mg Oral BID Saundra Shelling, MD   100 mg at 11/19/18 0834   finasteride (PROSCAR) tablet 5 mg  5 mg Oral Daily Pyreddy, Reatha Harps, MD   5 mg at 16/10/96 0454   folic acid (FOLVITE) tablet 1 mg  1 mg Oral Daily Nolon Stalls C, MD   1 mg at 11/19/18 0981   gabapentin (NEURONTIN) capsule 300 mg  300 mg Oral BID Saundra Shelling, MD   300 mg at 11/19/18 1914   hydrOXYzine (ATARAX/VISTARIL) tablet 25 mg  25 mg Oral Q8H PRN Saundra Shelling, MD        loratadine (CLARITIN) tablet 10 mg  10 mg Oral Daily Pyreddy, Reatha Harps, MD   10 mg at 11/19/18 0834   LORazepam (ATIVAN) tablet 0.5 mg  0.5 mg Oral Daily Pyreddy, Reatha Harps, MD   0.5 mg at 11/19/18 7829   metoprolol succinate (TOPROL-XL) 24 hr tablet 12.5 mg  12.5 mg Oral Daily Pyreddy, Reatha Harps, MD   12.5 mg at 11/17/18 0940   ondansetron (ZOFRAN) tablet 4 mg  4 mg Oral Q6H PRN Saundra Shelling, MD       Or   ondansetron (ZOFRAN) injection 4 mg  4 mg Intravenous Q6H PRN Pyreddy, Reatha Harps, MD       potassium chloride SA (K-DUR) CR tablet 40 mEq  40 mEq Oral Once Saundra Shelling, MD       senna (SENOKOT) tablet 8.6 mg  1 tablet Oral Daily PRN Pyreddy, Reatha Harps, MD       sodium bicarbonate 150 mEq in sterile water 1,000 mL infusion   Intravenous Continuous Pyreddy, Pavan, MD 100 mL/hr at 11/19/18 0148     traZODone (DESYREL) tablet 100 mg  100 mg Oral QHS PRN Gardiner Barefoot H, NP   100 mg at 11/18/18 2152   vancomycin variable dose per unstable renal function (pharmacist dosing)   Does not apply See admin instructions [name], RPH        OBJECTIVE: Vitals:   11/19/18 0329 11/19/18 0753  BP: 117/82 92/60  Pulse: 88 83  Resp: 20 18  Temp: 98 F (36.7 C) 97.7 F (36.5 C)  SpO2: 99% 98%     Body mass index is 24.87 kg/m.    ECOG FS:2 - Symptomatic, <50% confined to bed  General: Well-developed, well-nourished, no acute distress. Eyes: Pink conjunctiva, anicteric sclera. HEENT: Normocephalic. Lungs: Clear to auscultation bilaterally. Heart: Regular rate and rhythm. No rubs, murmurs, or gallops. Abdomen: Soft, nontender, nondistended. No organomegaly noted, normoactive bowel sounds. Musculoskeletal: No edema, cyanosis, or clubbing. Neuro: Alert, answering all questions appropriately. Cranial nerves grossly intact. Skin: No rashes or petechiae noted. Psych: Normal affect.  LAB RESULTS:  Lab Results  Component Value Date   NA 137 11/19/2018   K 2.9 (L) 11/19/2018   CL 97 (L)  11/19/2018   CO2 30 11/19/2018   GLUCOSE 152 (H) 11/19/2018   BUN 51 (H) 11/19/2018   CREATININE 2.57 (H) 11/19/2018   CALCIUM 9.9 11/19/2018   PROT 7.4 10/18/2018   ALBUMIN 3.2 (L) 10/18/2018   AST 28 10/18/2018   ALT 30 10/18/2018   ALKPHOS 66 10/18/2018   BILITOT 0.6 10/18/2018   GFRNONAA 26 (L) 11/19/2018   GFRAA 30 (L) 11/19/2018    Lab Results  Component Value Date   WBC 13.0 (H) 11/19/2018   NEUTROABS 11.0 (H) 11/10/2018   HGB 8.5 (L) 11/19/2018   HCT 26.7 (L) 11/19/2018   MCV 88.7 11/19/2018   PLT  289 11/19/2018     STUDIES: Ct Abdomen Pelvis Wo Contrast  Result Date: 11/12/2018 CLINICAL DATA:  Abnormal renal bladder ultrasound prior day EXAM: CT ABDOMEN AND PELVIS WITHOUT CONTRAST TECHNIQUE: Multidetector CT imaging of the abdomen and pelvis was performed following the standard protocol without IV contrast. Oral contrast was received. COMPARISON:  Ultrasound November 11, 2018 FINDINGS: Lower chest: The visualized heart size within normal limits. No pericardial fluid/thickening. No hiatal hernia. The visualized portions of the lungs are clear. Hepatobiliary: There are multiple low-density lesions seen throughout the liver parenchyma the largest measuring 6.2 cm within the caudate lobe no evidence of calcified gallstones, gallbladder wall thickening or biliary dilatation. Pancreas: Unremarkable. No pancreatic ductal dilatation or surrounding inflammatory changes. Spleen: Normal in size without focal abnormality. Adrenals/Urinary Tract: There is mild thickening of the bilateral adrenal glands without a focal abnormality. There is marked severe bilateral pelvicaliectasis and ureterectasis down to the level of the UVJ. There is a heterogeneous hyperdense large lobular bladder wall mass seen superiorly which protrudes and displaces several loops of small bowel. There is also a downward mass effect seen upon the prostate gland. The bladder is grossly distended with fluid. Within the upper  pole of the right kidney there is a 1.8 cm low-density lesion. There is a 6 mm calculus seen in the upper pole the left kidney. Stomach/Bowel: The stomach, small bowel, and colon are normal in appearance. A moderate amount of colonic stool is present. No definite areas of bowel wall thickening. Vascular/Lymphatic: No definite mesenteric or pelvic sidewall adenopathy seen. Scattered aortic and iliac atherosclerotic calcifications are seen without aneurysmal dilatation. Reproductive: Downward displacement due to the large bladder wall mass. Other: No evidence of abdominal wall mass or hernia. Musculoskel0etal: No acute or significant osseous findings. There is diffuse osteopenia. Fixation hardware seen in the left femur. IMPRESSION: 1. Large lobular soft tissue superior bladder wall mass causing marked bilateral hydronephrosis and mass effect upon small-bowel and prostate gland. 2. Multiple low-density lesions throughout the liver parenchyma the largest measuring 6 cm in the caudate lobe,unable to further evaluate due to the lack of intravenous contrast. 3. 6 mm renal calculus in the left upper pole. Electronically Signed   By: Prudencio Pair M.D.   On: 11/12/2018 12:49   Ct Head Wo Contrast  Result Date: 11/10/2018 CLINICAL DATA:  Altered level of consciousness. EXAM: CT HEAD WITHOUT CONTRAST TECHNIQUE: Contiguous axial images were obtained from the base of the skull through the vertex without intravenous contrast. COMPARISON:  None. FINDINGS: Brain: Mild atrophy and minimal small vessel disease. The brain demonstrates no evidence of hemorrhage, infarction, edema, mass effect, extra-axial fluid collection, hydrocephalus or mass lesion. Vascular: No hyperdense vessel or unexpected calcification. Skull: Normal. Negative for fracture or focal lesion. Sinuses/Orbits: No acute finding. Other: None. IMPRESSION: Mild atrophy and small vessel disease.  No acute findings. Electronically Signed   By: Aletta Edouard M.D.    On: 11/10/2018 17:55   US Renal  Result Date: 11/11/2018 CLINICAL DATA:  63 year old male with acute renal failure. EXAM: RENAL / URINARY TRACT ULTRASOUND COMPLETE COMPARISON:  None. FINDINGS: Right Kidney: Renal measurements: 11.6 x 5.9 x 6.8 centimeters = volume: 242 mL. Moderate to severe right hydronephrosis (image 2). Superimposed echogenic layering debris suspected within the dilated collecting system (see power Doppler image 11). Grossly normal renal cortical echogenicity. No right renal mass identified. Left Kidney: Renal measurements: 11.3 x 5.6 x 4.7 centimeters = volume: 155 mL. Moderate to severe hydronephrosis with similar echogenic  nonvascular debris throughout the collecting system (image 66). Suggestion of a superimposed shadowing calculus up to 10 millimeters (image 75). Grossly normal left cortical echogenicity. Bladder: Highly abnormal appearance of the bladder with what on grayscale imaging seems to be fungating mass, but with the application of Doppler (image 94) seems to be nonvascular tumefactive debris. The technologist reports the patient was unable to void. IMPRESSION: 1. Severely abnormal ultrasound appearance of the bladder and both kidneys. 2. Severe Bilateral Hydronephrosis with what appears to be bulky layering and adherent Urinary Debris throughout the both dilated collecting systems and the bladder. 3. Underlying bladder tumor is difficult to exclude. Recommend correlation with urinalysis and consider Urology consultation. Electronically Signed   By: Genevie Ann M.D.   On: 11/11/2018 17:13   Dg Chest Portable 1 View  Result Date: 11/10/2018 CLINICAL DATA:  Shortness of breath for the past 2-3 days. Smoker. EXAM: PORTABLE CHEST 1 VIEW COMPARISON:  10/19/2018. FINDINGS: Normal sized heart. Clear lungs with normal vascularity. Small calcified granuloma in the right lower lung zone. Unremarkable bones. IMPRESSION: No acute abnormality. Electronically Signed   By: Claudie Revering M.D.    On: 11/10/2018 17:11   Dg Or Urology Cysto Image ([facility] Only)  Result Date: 11/17/2018 There is no interpretation for this exam.  This order is for images obtained during a surgical procedure.  Please See "Surgeries" Tab for more information regarding the procedure.    ASSESSMENT: Squamous cell carcinoma of the bladder.  PLAN:    1.  Squamous cell carcinoma bladder: Case discussed with palliative care.  After considering hospice throughout the week, patient abruptly changed his mind today and would like to attempt treatments and is declining hospice.  Given his overall comorbidities chemotherapy may not be the best option, but immunotherapy is a possibility.  Once patient is discharged from the hospital, he can follow-up with his primary oncologist, Dr. Mike Gip, for further evaluation and treatment planning. 2.  Acute renal failure: Possibly secondary to obstruction, patient's creatinine is improved and is now 2.57. 3.  Hypercalcemia: Resolved.  Patient's calcium levels are 9.9 today. 4.  Anemia: Patient's hemoglobin is decreased, but stable at 8.5.  Will follow.  Lloyd Huger, MD   11/19/2018 1:48 PM

## 2018-11-19 NOTE — Progress Notes (Signed)
Urology Inpatient Progress Note  Subjective: Ruben Clark is a 63 y.o. male who is POD2 from cystoscopy with TURBT and right retrograde pyelography with Dr. Bernardo Heater.  Dr. Bernardo Heater was unable to pass bilateral stents to relieve the patient's hydronephrosis and so it remains untreated at this time.  Foley catheter remains in place.  Today, patient states he feels well.  He denies pain.  Foley draining clear, tea colored urine.  No suprapubic or CVA tenderness on physical exam.  I had a lengthy conversation with the patient regarding his treatment options at this point.  Per previous notes from [place], Infectious Diseases, and Palliative Care, he has refused chemotherapy or radiation and stated that he is not interested in any procedure that would involve placing tubes in his mouth or down his throat.  I readdressed these questions with him today.  He reiterated that he does not want any tubes placed in his mouth or throat for the purpose of medical treatment.  He insisted that he did not want to undergo chemotherapy at this time, stating that his mother underwent chemotherapy and "died from it."  Additionally, he stated that chemotherapy "makes you sick" and causes hair loss.  For these reasons, he insisted that he does not want to undergo chemo at this time.  I asked him what he believed might happen if he forewent chemotherapy for his bladder cancer at this time.  He replied that he would probably die and said he would prefer to be cremated when that happens.  Anti-infectives: Anti-infectives (From admission, onward)   Start     Dose/Rate Route Frequency Ordered Stop   11/18/18 1930  vancomycin (VANCOCIN) IVPB 1000 mg/200 mL premix     1,000 mg 200 mL/hr over 60 Minutes Intravenous  Once 11/18/18 1923 11/18/18 2258   11/17/18 1700  ceFEPIme (MAXIPIME) 2 g in sodium chloride 0.9 % 100 mL IVPB  Status:  Discontinued     2 g 200 mL/hr over 30 Minutes Intravenous Every 24 hours 11/17/18 1554  11/18/18 1905   11/16/18 1600  vancomycin (VANCOCIN) 1,250 mg in sodium chloride 0.9 % 250 mL IVPB     1,250 mg 166.7 mL/hr over 90 Minutes Intravenous  Once 11/16/18 1454 11/16/18 1934   11/16/18 1000  vancomycin (VANCOCIN) 1,250 mg in sodium chloride 0.9 % 250 mL IVPB  Status:  Discontinued     1,250 mg 166.7 mL/hr over 90 Minutes Intravenous Every 48 hours 11/14/18 1010 11/15/18 0812   11/15/18 0805  vancomycin variable dose per unstable renal function (pharmacist dosing)      Does not apply See admin instructions 11/15/18 0812     11/14/18 1015  vancomycin (VANCOCIN) 1,500 mg in sodium chloride 0.9 % 500 mL IVPB     1,500 mg 250 mL/hr over 120 Minutes Intravenous  Once 11/14/18 1000 11/14/18 1352   11/14/18 1000  ceFEPIme (MAXIPIME) 2 g in sodium chloride 0.9 % 100 mL IVPB  Status:  Discontinued     2 g 200 mL/hr over 30 Minutes Intravenous Every 24 hours 11/14/18 0959 11/15/18 0803   11/14/18 1000  vancomycin (VANCOCIN) 1,500 mg in sodium chloride 0.9 % 500 mL IVPB  Status:  Discontinued     1,500 mg 250 mL/hr over 120 Minutes Intravenous  Once 11/14/18 0959 11/14/18 1000      Current Facility-Administered Medications  Medication Dose Route Frequency Provider Last Rate Last Dose  . acetaminophen (TYLENOL) tablet 650 mg  650 mg Oral Q6H PRN Pyreddy,  Reatha Harps, MD   650 mg at 11/18/18 2000   Or  . acetaminophen (TYLENOL) suppository 650 mg  650 mg Rectal Q6H PRN Saundra Shelling, MD      . aspirin EC tablet 81 mg  81 mg Oral Daily Pyreddy, Reatha Harps, MD   81 mg at 11/19/18 0834  . bisacodyl (DULCOLAX) EC tablet 5 mg  5 mg Oral Daily PRN Saundra Shelling, MD      . digoxin (LANOXIN) tablet 0.25 mg  0.25 mg Oral Daily Pyreddy, Pavan, MD   0.25 mg at 11/19/18 0834  . docusate sodium (COLACE) capsule 100 mg  100 mg Oral BID Saundra Shelling, MD   100 mg at 11/19/18 0834  . finasteride (PROSCAR) tablet 5 mg  5 mg Oral Daily Pyreddy, Reatha Harps, MD   5 mg at 11/19/18 0834  . folic acid (FOLVITE) tablet 1 mg   1 mg Oral Daily Nolon Stalls C, MD   1 mg at 11/19/18 0834  . gabapentin (NEURONTIN) capsule 300 mg  300 mg Oral BID Saundra Shelling, MD   300 mg at 11/19/18 0834  . hydrOXYzine (ATARAX/VISTARIL) tablet 25 mg  25 mg Oral Q8H PRN Saundra Shelling, MD      . loratadine (CLARITIN) tablet 10 mg  10 mg Oral Daily Pyreddy, Reatha Harps, MD   10 mg at 11/19/18 0834  . LORazepam (ATIVAN) tablet 0.5 mg  0.5 mg Oral Daily Pyreddy, Pavan, MD   0.5 mg at 11/19/18 0835  . metoprolol succinate (TOPROL-XL) 24 hr tablet 12.5 mg  12.5 mg Oral Daily Pyreddy, Pavan, MD   12.5 mg at 11/17/18 0940  . ondansetron (ZOFRAN) tablet 4 mg  4 mg Oral Q6H PRN Saundra Shelling, MD       Or  . ondansetron (ZOFRAN) injection 4 mg  4 mg Intravenous Q6H PRN Pyreddy, Reatha Harps, MD      . senna (SENOKOT) tablet 8.6 mg  1 tablet Oral Daily PRN Pyreddy, Reatha Harps, MD      . sodium bicarbonate 150 mEq in sterile water 1,000 mL infusion   Intravenous Continuous Saundra Shelling, MD 100 mL/hr at 11/19/18 0148    . traZODone (DESYREL) tablet 100 mg  100 mg Oral QHS PRN Gardiner Barefoot H, NP   100 mg at 11/18/18 2152  . vancomycin variable dose per unstable renal function (pharmacist dosing)   Does not apply See admin instructions [name], RPH         Objective: Vital signs in last 24 hours: Temp:  [97.4 F (36.3 C)-98 F (36.7 C)] 97.7 F (36.5 C) (08/14 0753) Pulse Rate:  [61-88] 83 (08/14 0753) Resp:  [18-20] 18 (08/14 0753) BP: (92-117)/(60-82) 92/60 (08/14 0753) SpO2:  [98 %-100 %] 98 % (08/14 0753) Weight:  [78.6 kg] 78.6 kg (08/14 0329)  Intake/Output from previous day: 08/13 0701 - 08/14 0700 In: -  Out: 1500 [Urine:1500] Intake/Output this shift: No intake/output data recorded.  Physical Exam Vitals signs and nursing note reviewed.  Constitutional:      General: He is not in acute distress.    Appearance: He is underweight. He is not ill-appearing, toxic-appearing or diaphoretic.  HENT:     Head: Normocephalic and  atraumatic.  Pulmonary:     Effort: Pulmonary effort is normal. No respiratory distress.  Abdominal:     General: Abdomen is flat. There is no distension.     Palpations: Abdomen is soft.     Tenderness: There is no abdominal tenderness. There is no right  CVA tenderness or left CVA tenderness.  Neurological:     Mental Status: He is alert.    Lab Results:  Recent Labs    11/18/18 0532 11/19/18 0757  WBC 14.1* 13.0*  HGB 8.1* 8.5*  HCT 25.2* 26.7*  PLT 276 289   BMET Recent Labs    11/18/18 0532 11/19/18 0757  NA 141 137  K 3.6 2.9*  CL 103 97*  CO2 27 30  GLUCOSE 144* 152*  BUN 60* 51*  CREATININE 3.32* 2.57*  CALCIUM 10.4* 9.9   Assessment & Plan: Patient is clinically stable, but with continued untreated bilateral hydronephrosis believe secondary to his known bladder cancer.  Given the inability to place stents intraoperatively, the only remaining option for renal decompression at this point is bilateral PCN placement.  However, given that the patient does not wish to proceed with treatment for his bladder cancer, I do not recommend this as it would be too invasive.   Per note by Dr. Mike Gip on 11/17/2018, patient's guardianship allows him to refuse chemotherapy.  His reasoning for doing so has been consistent per our conversation today and review of recent notes with other providers.  While I suspect that his justification for this decision is to be weak secondary to his intellectual limitations, he does exhibit an understanding of the consequences of his choice, i.e. death.  I will defer to his oncology and palliative care teams at this time.  If we can confirm patient's ability to refuse chemotherapy and it is decided to proceed with palliative care, again, I do not recommend bilateral PCN placement.  If the decision is made to proceed with treatment of his known bladder cancer, he should undergo PCN placement at that time.  We will await further communication from these  teams regarding how to proceed.  Debroah Loop, PA-C 11/19/2018

## 2018-11-20 LAB — BASIC METABOLIC PANEL
Anion gap: 7 (ref 5–15)
BUN: 41 mg/dL — ABNORMAL HIGH (ref 8–23)
CO2: 35 mmol/L — ABNORMAL HIGH (ref 22–32)
Calcium: 9.3 mg/dL (ref 8.9–10.3)
Chloride: 98 mmol/L (ref 98–111)
Creatinine, Ser: 1.96 mg/dL — ABNORMAL HIGH (ref 0.61–1.24)
GFR calc Af Amer: 41 mL/min — ABNORMAL LOW (ref >60)
GFR calc non Af Amer: 36 mL/min — ABNORMAL LOW (ref >60)
Glucose, Bld: 109 mg/dL — ABNORMAL HIGH (ref 70–99)
Potassium: 3.3 mmol/L — ABNORMAL LOW (ref 3.5–5.1)
Sodium: 140 mmol/L (ref 135–145)

## 2018-11-20 LAB — CBC
HCT: 25.8 % — ABNORMAL LOW (ref 39.0–52.0)
Hemoglobin: 8.2 g/dL — ABNORMAL LOW (ref 13.0–17.0)
MCH: 28.3 pg (ref 26.0–34.0)
MCHC: 31.8 g/dL (ref 30.0–36.0)
MCV: 89 fL (ref 80.0–100.0)
Platelets: 293 10*3/uL (ref 150–400)
RBC: 2.9 MIL/uL — ABNORMAL LOW (ref 4.22–5.81)
RDW: 15.3 % (ref 11.5–15.5)
WBC: 12.5 10*3/uL — ABNORMAL HIGH (ref 4.0–10.5)
nRBC: 0 % (ref 0.0–0.2)

## 2018-11-20 LAB — PTH-RELATED PEPTIDE: PTH-related peptide: <2 pmol/L

## 2018-11-20 LAB — VANCOMYCIN, RANDOM: Vancomycin Rm: 18

## 2018-11-20 MED ORDER — POTASSIUM CHLORIDE CRYS ER 20 MEQ PO TBCR
40.0000 meq | EXTENDED_RELEASE_TABLET | Freq: Once | ORAL | Status: AC
  Administered 2018-11-20: 40 meq via ORAL
  Filled 2018-11-20: qty 2

## 2018-11-20 MED ORDER — VANCOMYCIN HCL IN DEXTROSE 1-5 GM/200ML-% IV SOLN
1000.0000 mg | Freq: Once | INTRAVENOUS | Status: AC
  Administered 2018-11-20: 1000 mg via INTRAVENOUS
  Filled 2018-11-20: qty 200

## 2018-11-20 NOTE — Progress Notes (Addendum)
Pharmacy Antibiotic Note  Ruben Clark is a 63 y.o. male admitted on 11/10/2018 with sepsis.  Pharmacy has been consulted for Vancomycin. Cefepime was discontinued d/t blood cultures. However, it was briefly restarted d/t concerns of his blood pressure being low and leucocytosis. After bladder wash out and release of purulent fluid, WBC has down trended.  Vancomycin 1500 mg IV x1 given 8/9 at 1144 Vanc random 15 on 8/11 at 1307. Vancomycin 1250 IV x1 given at [place] random 18 on 8/13 at 1822. Vancomycin 1 g x1 given on 8/13 @ 2158.  Plan: Vanc random 18 on 8/15 at 1021. Will order Vancomycin 1 g x1 to be given today.   Of note, based on kinetics, Vancomycin 1000 mg IV q24 would result in: Expected AUC: 466 SCr used: 1.96 Expected Cmin 12.4  Patient's renal function has improved from yesterday but will check Vanc random tomorrow at 1100 as renal function continues to change and is not yet stable. Will need to continue to closely monitor renal function.    Pharmacist to follow renal function and reassess vancomycin dosing as indicated. Will follow Vanc Random and creatinine tomorrow.    Height: 5\' 10"  (177.8 cm) Weight: 173 lb 3.2 oz (78.6 kg) IBW/kg (Calculated) : 73  Temp (24hrs), Avg:98.2 F (36.8 C), Min:98 F (36.7 C), Max:98.4 F (36.9 C)  Recent Labs  Lab 11/14/18 1053  11/16/18 0012  11/17/18 0639 11/18/18 0532 11/18/18 1822 11/19/18 0757 11/20/18 0527 11/20/18 1021  WBC  --    < > 22.9*  --  23.1* 14.1*  --  13.0* 12.5*  --   CREATININE  --    < > 3.25*  --  3.83* 3.32*  --  2.57* 1.96*  --   LATICACIDVEN 1.8  --   --   --   --   --   --   --   --   --   VANCORANDOM  --   --   --    < >  --   --  18  --   --  18   < > = values in this interval not displayed.    Estimated Creatinine Clearance: 40.3 mL/min (A) (by C-G formula based on SCr of 1.96 mg/dL (H)).    Allergies  Allergen Reactions  . 5-Alpha Reductase Inhibitors     Antimicrobials this  admission: Vancomycin  8/9 >>  Cefepime 8/9 >> 8/10; 8/12 >> 8/13  Microbiology: 8/8 Urine: MRSA 8/9 Blood: MRSA 8/11 Blood: NG 2 days  Thank you for allowing pharmacy to be a part of this patient's care.  Rayna Sexton, PharmD, BCPS Clinical Pharmacist 11/20/2018 11:12 AM

## 2018-11-20 NOTE — Progress Notes (Signed)
[facility] [place] NAME: Ruben Clark    MR#:  [id number]  DATE OF BIRTH:  [birth year]  SUBJECTIVE:   Patient now wants treatment for his cancer   REVIEW OF SYSTEMS:    Review of Systems  Constitutional: Negative for fever, chills weight loss HENT: Negative for ear pain, nosebleeds, congestion, facial swelling, rhinorrhea, neck pain, neck stiffness and ear discharge.   Respiratory: Negative for cough, shortness of breath, wheezing  Cardiovascular: Negative for chest pain, palpitations and leg swelling.  Gastrointestinal: Negative for heartburn, abdominal pain, vomiting, diarrhea or consitpation Genitourinary: Negative for dysuria, urgency, frequency, hematuria Musculoskeletal: Negative for back pain or joint pain Neurological: Negative for dizziness, seizures, syncope, focal weakness,  numbness and headaches.  Hematological: Does not bruise/bleed easily.  Psychiatric/Behavioral: Negative for hallucinations, confusion, dysphoric mood    Tolerating Diet: yes      DRUG ALLERGIES:   Allergies  Allergen Reactions  . 5-Alpha Reductase Inhibitors     VITALS:  Blood pressure 115/73, pulse 87, temperature 98.2 F (36.8 C), temperature source Oral, resp. rate 19, height 5\' 10"  (1.778 m), weight 78.6 kg, SpO2 98 %.  PHYSICAL EXAMINATION:  Constitutional: Appears frail. No distress. HENT: Normocephalic. Marland Kitchen Oropharynx is clear and moist.  Eyes: Conjunctivae and EOM are normal. PERRLA, no scleral icterus.  Neck: Normal ROM. Neck supple. No JVD. No tracheal deviation. CVS: RRR, S1/S2 +, no murmurs, no gallops, no carotid bruit.  Pulmonary: Effort and breath sounds normal, no stridor, rhonchi, wheezes, rales.  Abdominal: Soft. BS +,  no distension, tenderness, rebound or guarding.  Musculoskeletal: Normal range of motion. No edema and no tenderness.  Neuro: Alert. CN 2-12 grossly intact. No focal deficits. Skin: Skin is warm and dry. No  rash noted. Psychiatric: Normal mood and affect.      LABORATORY PANEL:   CBC Recent Labs  Lab 11/20/18 0527  WBC 12.5*  HGB 8.2*  HCT 25.8*  PLT 293   ------------------------------------------------------------------------------------------------------------------  Chemistries  Recent Labs  Lab 11/20/18 0527  NA 140  K 3.3*  CL 98  CO2 35*  GLUCOSE 109*  BUN 41*  CREATININE 1.96*  CALCIUM 9.3   ------------------------------------------------------------------------------------------------------------------  Cardiac Enzymes No results for input(s): TROPONINI in the last 168 hours. ------------------------------------------------------------------------------------------------------------------  RADIOLOGY:  No results found.   ASSESSMENT AND PLAN:   63 year old male with chronic systolic heart failure, PAF and squamous cell carcinoma of the bladder who presented to the emergency room due to generalized weakness.   1.  Acute kidney and chronic kidney disease stage III due to bilateral hydronephrosis from obstruction: Creatinine has improved. Patient will need Foley catheter at discharge. Ureters could not be stented and plan was possible bilateral nephrostomy. However it appears that there is not an urgent need for nephrostomy given at renal function is improving. He will need outpatient urology follow-up. 2.  MRSA bacteremia: Source is from urinary tract from the bladder tumor.  Repeat blood cultures are negative.  2D echocardiogram did not show vegetation.  Patient does not want TEE.  Patient is currently on vancomycin.  Patient will need PICC line and treatment for 4 to 6 weeks. We will follow-up with creatinine tomorrow and dosing for vancomycin.  3.  PAF: Continue digoxin and metoprolol  4.  Hyperglycemia due to acute kidney injury which is resolved  5.  Metabolic encephalopathy: Patient is at baseline  6.  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  Patient underwent TURBT on August 12. He will follow-up with Dr. Mike Gip  Management plans discussed with the patient and he is in agreement.  CODE STATUS: full Does not want hospice  TOTAL TIME TAKING CARE OF THIS PATIENT: 30 minutes.     POSSIBLE D/C tomorrow SNF, DEPENDING ON CLINICAL CONDITION.   Bettey Costa M.D on 11/20/2018 at 1:01 PM  Between 7am to 6pm - Pager - [phone] After 6pm go to [email or web address] - password [email or web address] [facility]  Office  [phone]  CC: Primary care physician; System, Pcp Not In  Note: This dictation was prepared with Dragon dictation along with smaller phrase technology. Any transcriptional errors that result from this process are unintentional.

## 2018-11-20 NOTE — Progress Notes (Signed)
[place], [place] 11/20/18  Subjective:  Patient seen at bedside. Creatinine currently 2.0.   Objective:  Vital signs in last 24 hours:  Temp:  [98 F (36.7 C)-98.4 F (36.9 C)] 98.2 F (36.8 C) (08/15 0756) Pulse Rate:  [87-110] 87 (08/15 0756) Resp:  [18-20] 19 (08/15 0756) BP: (96-115)/(62-73) 115/73 (08/15 0756) SpO2:  [97 %-100 %] 98 % (08/15 0756) Weight:  [78.6 kg] 78.6 kg (08/15 0539)  Weight change: -0.045 kg Filed Weights   11/18/18 0540 11/19/18 0329 11/20/18 0539  Weight: 76.5 kg 78.6 kg 78.6 kg    Intake/Output:    Intake/Output Summary (Last 24 hours) at 11/20/2018 1105 Last data filed at 11/20/2018 0316 Gross per 24 hour  Intake -  Output 2350 ml  Net -2350 ml     Physical Exam: General:  Thin, cachectic gentleman, laying in the bed  HEENT  anicteric, moist oral mucous membranes  Neck  supple  Pulm/lungs  clear to auscultation, normal effort  CVS/Heart  no rub, S1S2  Abdomen:   soft, BS present  Extremities:  No edema  Neurologic:  Alert, oriented to person/place  Skin:  Dry skin    Basic Metabolic Panel:  Recent Labs  Lab 11/16/18 0012 11/17/18 0639 11/18/18 0532 11/19/18 0757 11/20/18 0527  NA 138 137 141 137 140  K 3.6 3.6 3.6 2.9* 3.3*  CL 107 103 103 97* 98  CO2 23 24 27 30  35*  GLUCOSE 98 70 144* 152* 109*  BUN 53* 57* 60* 51* 41*  CREATININE 3.25* 3.83* 3.32* 2.57* 1.96*  CALCIUM 11.7* 11.1* 10.4* 9.9 9.3     CBC: Recent Labs  Lab 11/16/18 0012 11/17/18 0639 11/18/18 0532 11/19/18 0757 11/20/18 0527  WBC 22.9* 23.1* 14.1* 13.0* 12.5*  HGB 9.6* 8.3* 8.1* 8.5* 8.2*  HCT 29.7* 25.5* 25.2* 26.7* 25.8*  MCV 89.5 88.2 88.4 88.7 89.0  PLT 404* 329 276 289 293      Lab Results  Component Value Date   HEPBSAG Negative 11/11/2018   HEPBSAB Non Reactive 11/11/2018      Microbiology:  Recent Results (from the past 240 hour(s))  SARS Coronavirus 2 [facility] order, Performed in [facility] hospital lab) Nasopharyngeal Nasopharyngeal Swab     Status: None   Collection Time: 11/10/18  6:40 PM   Specimen: Nasopharyngeal Swab  Result Value Ref Range Status   SARS Coronavirus 2 NEGATIVE NEGATIVE Final    Comment: (NOTE) If result is NEGATIVE SARS-CoV-2 target nucleic acids are NOT DETECTED. The SARS-CoV-2 RNA is generally detectable in upper and lower  respiratory specimens during the acute phase of infection. The lowest  concentration of SARS-CoV-2 viral copies this assay can detect is 250  copies / mL. A negative result does not preclude SARS-CoV-2 infection  and should not be used as the sole basis for treatment or other  patient management decisions.  A negative result may occur with  improper specimen collection / handling, submission of specimen other  than nasopharyngeal swab, presence of viral mutation(s) within the  areas targeted by this assay, and inadequate number of viral copies  (<250 copies / mL). A negative result must be combined with clinical  observations, patient history, and epidemiological information. If result is POSITIVE SARS-CoV-2 target nucleic acids are DETECTED. The SARS-CoV-2 RNA is generally detectable in upper and lower  respiratory specimens dur ing the acute phase of infection.  Positive  results are indicative of active infection with SARS-CoV-2.  Clinical  correlation with patient history and other diagnostic information is  necessary to determine patient infection status.  Positive results do  not rule out bacterial infection or co-infection with other viruses. If result is PRESUMPTIVE POSTIVE SARS-CoV-2 nucleic acids MAY BE PRESENT.   A presumptive positive result was obtained on the submitted specimen  and confirmed on repeat testing.  While 2019 novel coronavirus  (SARS-CoV-2) nucleic acids may be present in the submitted sample  additional confirmatory testing may be necessary for epidemiological  and / or clinical management  purposes  to differentiate between  SARS-CoV-2 and other Sarbecovirus currently known to infect humans.  If clinically indicated additional testing with an alternate test  methodology [phone]) is advised. The SARS-CoV-2 RNA is generally  detectable in upper and lower respiratory sp ecimens during the acute  phase of infection. The expected result is Negative. Fact Sheet for Patients:  StrictlyIdeas.no Fact Sheet for Healthcare Providers: BankingDealers.co.za This test is not yet approved or cleared by the [place] FDA and has been authorized for detection and/or diagnosis of SARS-CoV-2 by FDA under an Emergency Use Authorization (EUA).  This EUA will remain in effect (meaning this test can be used) for the duration of the COVID-19 declaration under Section 564(b)(1) of the Act, 21 U.S.C. section 360bbb-3(b)(1), unless the authorization is terminated or revoked sooner. Performed at [facility], [address] [place], [place] [address]   Urine Culture     Status: Abnormal   Collection Time: 11/13/18 11:21 PM   Specimen: Urine, Random  Result Value Ref Range Status   Specimen Description   Final    URINE, RANDOM Performed at [facility], [address] [place], [place] [address]    Special Requests   Final    NONE Performed at [facility], [address] [place], [place] [address]    Culture (A)  Final    >=100,000 COLONIES/mL METHICILLIN RESISTANT STAPHYLOCOCCUS AUREUS   Report Status 11/17/2018 FINAL  Final   Organism ID, Bacteria METHICILLIN RESISTANT STAPHYLOCOCCUS AUREUS (A)  Final      Susceptibility   Methicillin resistant staphylococcus aureus - MIC*    CIPROFLOXACIN >=8 RESISTANT Resistant     GENTAMICIN <=0.5 SENSITIVE Sensitive     NITROFURANTOIN <=16 SENSITIVE Sensitive     OXACILLIN >=4 RESISTANT Resistant     TETRACYCLINE <=1 SENSITIVE Sensitive     VANCOMYCIN 1 SENSITIVE  Sensitive     TRIMETH/SULFA <=10 SENSITIVE Sensitive     CLINDAMYCIN >=8 RESISTANT Resistant     RIFAMPIN <=0.5 SENSITIVE Sensitive     Inducible Clindamycin NEGATIVE Sensitive     * >=100,000 COLONIES/mL METHICILLIN RESISTANT STAPHYLOCOCCUS AUREUS  CULTURE, BLOOD (ROUTINE X 2) w Reflex to ID Panel     Status: Abnormal   Collection Time: 11/14/18 10:53 AM   Specimen: BLOOD  Result Value Ref Range Status   Specimen Description   Final    BLOOD  L FOREARM Performed at [facility], [address] [place], [place] [address]    Special Requests   Final    BOTTLES DRAWN AEROBIC AND ANAEROBIC Blood Culture adequate volume Performed at [facility], [address] [place], [place] [address]    Culture  Setup Time   Final    GRAM POSITIVE COCCI AEROBIC BOTTLE ONLY CRITICAL VALUE NOTED.  VALUE IS CONSISTENT WITH PREVIOUSLY REPORTED AND CALLED VALUE. Performed at [facility], [address], [place], [place] [address]    Culture (A)  Final    STAPHYLOCOCCUS AUREUS SUSCEPTIBILITIES PERFORMED ON PREVIOUS CULTURE WITHIN THE LAST 5 DAYS. Performed at [facility], [place] [address] [place], [place] [address]    Report Status 11/17/2018 FINAL  Final  CULTURE, BLOOD (ROUTINE X 2) w Reflex to ID Panel     Status: Abnormal   Collection Time: 11/14/18 11:04 AM   Specimen: BLOOD  Result Value Ref Range Status   Specimen Description   Final    BLOOD L HAND Performed at [facility], [address], [place], [place] [address]    Special Requests   Final    BOTTLES DRAWN AEROBIC AND ANAEROBIC Blood Culture adequate volume Performed at [facility], [address] [place], [place] [address]    Culture  Setup Time   Final    GRAM POSITIVE COCCI IN BOTH AEROBIC AND ANAEROBIC BOTTLES CRITICAL RESULT CALLED TO, READ BACK BY AND VERIFIED WITH: CHARLES SHANLEVER AT 1194 ON 11/15/2018 [place]. Performed at [facility], [place] [address]  [place], [place] [address]    Culture METHICILLIN RESISTANT STAPHYLOCOCCUS AUREUS (A)  Final   Report Status 11/17/2018 FINAL  Final   Organism ID, Bacteria METHICILLIN RESISTANT STAPHYLOCOCCUS AUREUS  Final      Susceptibility   Methicillin resistant staphylococcus aureus - MIC*    CIPROFLOXACIN >=8 RESISTANT Resistant     ERYTHROMYCIN >=8 RESISTANT Resistant     GENTAMICIN <=0.5 SENSITIVE Sensitive     OXACILLIN >=4 RESISTANT Resistant     TETRACYCLINE <=1 SENSITIVE Sensitive     VANCOMYCIN 1 SENSITIVE Sensitive     TRIMETH/SULFA <=10 SENSITIVE Sensitive     CLINDAMYCIN >=8 RESISTANT Resistant     RIFAMPIN <=0.5 SENSITIVE Sensitive     Inducible Clindamycin NEGATIVE Sensitive     * METHICILLIN RESISTANT STAPHYLOCOCCUS AUREUS  Blood Culture ID Panel (Reflexed)     Status: Abnormal   Collection Time: 11/14/18 11:04 AM  Result Value Ref Range Status   Enterococcus species NOT DETECTED NOT DETECTED Final   Listeria monocytogenes NOT DETECTED NOT DETECTED Final   Staphylococcus species DETECTED (A) NOT DETECTED Final    Comment: CRITICAL RESULT CALLED TO, READ BACK BY AND VERIFIED WITH: CHARLES SHANLEVER AT 1448 ON 11/15/2018 [place].    Staphylococcus aureus (BCID) DETECTED (A) NOT DETECTED Final    Comment: Methicillin (oxacillin)-resistant Staphylococcus aureus (MRSA). MRSA is predictably resistant to beta-lactam antibiotics (except ceftaroline). Preferred therapy is vancomycin unless clinically contraindicated. Patient requires contact precautions if  hospitalized. CRITICAL RESULT CALLED TO, READ BACK BY AND VERIFIED WITH: CHARLES SHANLEVER AT 1856 ON 11/15/2018 [place].    Methicillin resistance DETECTED (A) NOT DETECTED Final    Comment: CRITICAL RESULT CALLED TO, READ BACK BY AND VERIFIED WITH: CHARLES SHANLEVER AT [phone] ON 11/15/2018 [place].    Streptococcus species NOT DETECTED NOT DETECTED Final   Streptococcus agalactiae NOT DETECTED NOT DETECTED Final   Streptococcus pneumoniae NOT  DETECTED NOT DETECTED Final   Streptococcus pyogenes NOT DETECTED NOT DETECTED Final   Acinetobacter baumannii NOT DETECTED NOT DETECTED Final   Enterobacteriaceae species NOT DETECTED NOT DETECTED Final   Enterobacter cloacae complex NOT DETECTED NOT DETECTED Final   Escherichia coli NOT DETECTED NOT DETECTED Final   Klebsiella oxytoca NOT DETECTED NOT DETECTED Final   Klebsiella pneumoniae NOT DETECTED NOT DETECTED Final   Proteus species NOT DETECTED NOT DETECTED Final   Serratia marcescens NOT DETECTED NOT DETECTED Final   Haemophilus influenzae NOT DETECTED NOT DETECTED Final  Neisseria meningitidis NOT DETECTED NOT DETECTED Final   Pseudomonas aeruginosa NOT DETECTED NOT DETECTED Final   Candida albicans NOT DETECTED NOT DETECTED Final   Candida glabrata NOT DETECTED NOT DETECTED Final   Candida krusei NOT DETECTED NOT DETECTED Final   Candida parapsilosis NOT DETECTED NOT DETECTED Final   Candida tropicalis NOT DETECTED NOT DETECTED Final    Comment: Performed at [facility], [address] [place], [place] [address]  CULTURE, BLOOD (ROUTINE X 2) w Reflex to ID Panel     Status: None (Preliminary result)   Collection Time: 11/16/18 12:05 AM   Specimen: BLOOD  Result Value Ref Range Status   Specimen Description BLOOD LEFT ASSIST CONTROL  Final   Special Requests   Final    BOTTLES DRAWN AEROBIC AND ANAEROBIC Blood Culture results may not be optimal due to an excessive volume of blood received in culture bottles   Culture   Final    NO GROWTH 4 DAYS Performed at [facility], [address], [place], [place] [address]    Report Status PENDING  Incomplete  CULTURE, BLOOD (ROUTINE X 2) w Reflex to ID Panel     Status: None (Preliminary result)   Collection Time: 11/16/18 12:11 AM   Specimen: BLOOD  Result Value Ref Range Status   Specimen Description BLOOD LEFT HAND  Final   Special Requests   Final    BOTTLES DRAWN AEROBIC AND ANAEROBIC Blood Culture  adequate volume   Culture   Final    NO GROWTH 4 DAYS Performed at [facility], [address] [place], [place] [address]    Report Status PENDING  Incomplete    Coagulation Studies: No results for input(s): LABPROT, INR in the last 72 hours.  Urinalysis: No results for input(s): COLORURINE, LABSPEC, PHURINE, GLUCOSEU, HGBUR, BILIRUBINUR, KETONESUR, PROTEINUR, UROBILINOGEN, NITRITE, LEUKOCYTESUR in the last 72 hours.  Invalid input(s): APPERANCEUR    Imaging: No results found.   Medications:   .  sodium bicarbonate (isotonic) infusion in sterile water 100 mL/hr at 11/20/18 0058  . vancomycin     . apixaban  5 mg Oral BID  . aspirin EC  81 mg Oral Daily  . digoxin  0.25 mg Oral Daily  . docusate sodium  100 mg Oral BID  . finasteride  5 mg Oral Daily  . folic acid  1 mg Oral Daily  . gabapentin  300 mg Oral BID  . loratadine  10 mg Oral Daily  . LORazepam  0.5 mg Oral Daily  . metoprolol succinate  12.5 mg Oral Daily  . vancomycin variable dose per unstable renal function (pharmacist dosing)   Does not apply See admin instructions   acetaminophen **OR** acetaminophen, bisacodyl, hydrOXYzine, ondansetron **OR** ondansetron (ZOFRAN) IV, senna, traZODone  Assessment/ Plan:  63 y.o. male with medical problems of ch sys CHF, CKD, A Fib, HTN who is a resident of group home is admitted to [facility] on 11/10/2018 for evaluation of weakness, weight loss. Had left femoral neck fracture in 10/2018  1. AKI, bilateral hydronephrosis 2. Hypercalcemia 3. Weight loss, cachexia 4. Anemia 5.  Bladder mass 6.  Metabolic acidosis.  Plan: It appears that the patient is now declining comfort care.  He is desiring treatment.  Immunotherapy appears to be an option for squamous cell carcinoma of the bladder.  In regards to his acute renal failure creatinine continues to improve and is currently down to 1.96.  He does have a Foley catheter in  place.  Previously bilateral nephrostomy  tubes were being considered however at that time patient had decided upon comfort care.  However no urgent need for nephrostomy is now given the fact that renal function does appear to be improving.  Would consider further urologic input as an outpatient.  LOS: 10 Railynn Ballo 8/15/202011:05 AM  [place], [place]  Note: This note was prepared with Dragon dictation. Any transcription errors are unintentional

## 2018-11-21 ENCOUNTER — Inpatient Hospital Stay: Payer: Self-pay

## 2018-11-21 LAB — VANCOMYCIN, RANDOM: Vancomycin Rm: 20

## 2018-11-21 LAB — BASIC METABOLIC PANEL
Anion gap: 7 (ref 5–15)
BUN: 27 mg/dL — ABNORMAL HIGH (ref 8–23)
CO2: 32 mmol/L (ref 22–32)
Calcium: 8.9 mg/dL (ref 8.9–10.3)
Chloride: 100 mmol/L (ref 98–111)
Creatinine, Ser: 1.39 mg/dL — ABNORMAL HIGH (ref 0.61–1.24)
GFR calc Af Amer: >60 mL/min (ref >60)
GFR calc non Af Amer: 54 mL/min — ABNORMAL LOW (ref >60)
Glucose, Bld: 83 mg/dL (ref 70–99)
Potassium: 3.2 mmol/L — ABNORMAL LOW (ref 3.5–5.1)
Sodium: 139 mmol/L (ref 135–145)

## 2018-11-21 LAB — CULTURE, BLOOD (ROUTINE X 2)
Culture: NO GROWTH
Culture: NO GROWTH
Special Requests: ADEQUATE

## 2018-11-21 LAB — DIGOXIN LEVEL: Digoxin Level: 0.8 ng/mL (ref 0.8–2.0)

## 2018-11-21 MED ORDER — VANCOMYCIN HCL 1.25 G IV SOLR
1250.0000 mg | INTRAVENOUS | Status: DC
  Administered 2018-11-21: 1250 mg via INTRAVENOUS
  Filled 2018-11-21 (×2): qty 1250

## 2018-11-21 MED ORDER — LORAZEPAM 0.5 MG PO TABS: 0.5000 mg | ORAL_TABLET | Freq: Every day | ORAL | 0 refills | Status: DC

## 2018-11-21 MED ORDER — POTASSIUM CHLORIDE 10 MEQ/100ML IV SOLN
10.0000 meq | INTRAVENOUS | Status: AC
  Administered 2018-11-21 (×4): 10 meq via INTRAVENOUS
  Filled 2018-11-21 (×4): qty 100

## 2018-11-21 MED ORDER — VANCOMYCIN HCL 1.25 G IV SOLR: 1250.0000 mg | INTRAVENOUS | 0 refills | Status: DC

## 2018-11-21 MED ORDER — VANCOMYCIN HCL 10 G IV SOLR
1250.0000 mg | INTRAVENOUS | Status: DC
  Filled 2018-11-21: qty 1250

## 2018-11-21 MED ORDER — VANCOMYCIN IV (FOR PTA / DISCHARGE USE ONLY): 1250.0000 mg | INTRAVENOUS | 0 refills | Status: DC

## 2018-11-21 NOTE — Discharge Summary (Addendum)
[facility] NAME: Ruben Clark    MR#:  [id number]  DATE OF BIRTH:  02-28-1956  DATE OF ADMISSION:  11/10/2018 ADMITTING PHYSICIAN: Saundra Shelling, MD  DATE OF DISCHARGE: 11/23/2018   PRIMARY CARE PHYSICIAN: System, Pcp Not In    ADMISSION DIAGNOSIS:  Hypercalcemia [E83.52] Generalized weakness [R53.1] Altered mental status, unspecified altered mental status type [R41.82]  DISCHARGE DIAGNOSIS:  Active Problems:   Hypercalcemia   SECONDARY DIAGNOSIS:   Past Medical History:  Diagnosis Date  . CHF (congestive heart failure) ([place])   . Chronic kidney disease (CKD), stage III (moderate) ([facility])   . Hypertension     HOSPITAL COURSE:  63 year old male with chronic systolic heart failure, PAF and squamous cell carcinoma of the bladder who presented to the emergency room due to generalized weakness.   1.  Acute kidney and chronic kidney disease stage III due to bilateral hydronephrosis from obstruction: Creatinine has improved. Patient will need Foley catheter at discharge. Ureters could not be stented and plan was possible bilateral nephrostomy. However it appears that there is not an urgent need for nephrostomy given at renal function is improving. He will need outpatient urology follow-up.   2.  MRSA bacteremia: Source is from urinary tract from the bladder tumor.  Repeat blood cultures are negative.  2D echocardiogram did not show vegetation.  Patient does not want TEE.  Patient is currently on vancomycin.  Patient with PICC line and treatment for 4 to 6 weeks. PICC/Vancomycin protocol and outpatient follow up with Dr Delaine Lame..  3.  PAF: Continue digoxin and metoprolol  4.  Hyperglycemia due to acute kidney injury which is resolved  5.  Metabolic encephalopathy: Patient is at baseline  6.  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  Patient underwent TURBT on August 12. He will follow-up with Dr. Mike Gip   DISCHARGE CONDITIONS AND DIET:  Stable regular diet  CONSULTS OBTAINED:  Treatment Team:  Murlean Iba, MD Abbie Sons, MD  DRUG ALLERGIES:   Allergies  Allergen Reactions  . 5-Alpha Reductase Inhibitors     DISCHARGE MEDICATIONS:   Allergies as of 11/22/2018      Reactions   5-alpha Reductase Inhibitors       Medication List    TAKE these medications   apixaban 5 MG Tabs tablet Commonly known as: ELIQUIS Take 1 tablet (5 mg total) by mouth 2 (two) times daily.   aspirin EC 81 MG tablet Take 81 mg by mouth daily.   bisacodyl 5 MG EC tablet Commonly known as: DULCOLAX Take 1 tablet (5 mg total) by mouth daily as needed for moderate constipation.   digoxin 0.25 MG tablet Commonly known as: LANOXIN Take 0.25 mg by mouth daily.   docusate sodium 100 MG capsule Commonly known as: COLACE Take 100 mg by mouth 2 (two) times daily.   finasteride 5 MG tablet Commonly known as: PROSCAR Take 1 tablet (5 mg total) by mouth daily.   gabapentin 300 MG capsule Commonly known as: NEURONTIN Take 300 mg by mouth 2 (two) times daily.   hydrOXYzine 25 MG tablet Commonly known as: ATARAX/VISTARIL Take 25 mg by mouth every 8 (eight) hours as needed for itching.   loratadine 10 MG tablet Commonly known as: CLARITIN Take 10 mg by mouth daily.   LORazepam 0.5 MG tablet Commonly known as: ATIVAN Take 1 tablet (0.5 mg total) by mouth  daily.   metoprolol succinate 25 MG 24 hr tablet Commonly known as: TOPROL-XL Take 0.5 tablets (12.5 mg total) by mouth daily.   senna 8.6 MG Tabs tablet Commonly known as: SENOKOT Take 1 tablet (8.6 mg total) by mouth daily as needed for mild constipation.   vancomycin  IVPB Inject 1,000 mg into the vein daily for 21 days. Indication:  MRSA bacteremia Last Day of Therapy:  12/13/18 Labs: CBC with diff/ CMP and vanco trough every  Monday while on antibiotics. Vanco trough and BMP every Thursday while on antibiotics. PICC care including placement of biopatch. Remove PICC at completion of treatment. Fax results to Dr. Delaine Lame at [phone]. Follow up appt with Dr. Delaine Lame in 2 weeks . Call [phone] for appt.  Dose to be adjusted according to vanco trough and cr . Keep the trough between 15-20            Home Infusion Instuctions  (From admission, onward)         Start     Ordered   11/21/18 0000  Home infusion instructions [facility] May follow [place] Dosing Protocol; May administer Cathflo as needed to maintain patency of vascular access device.; Flushing of vascular access device: per [facility] Protocol: 0.9% NaCl pre/post medica...    Question Answer Comment  Instructions May follow [place] Dosing Protocol   Instructions May administer Cathflo as needed to maintain patency of vascular access device.   Instructions Flushing of vascular access device: per [facility] Protocol: 0.9% NaCl pre/post medication administration and prn patency; Heparin 100 u/ml, 62ml for implanted ports and Heparin 10u/ml, 64ml for all other central venous catheters.   Instructions May follow [facility] Anaphylaxis Protocol for First Dose Administration in the home: 0.9% NaCl at 25-50 ml/hr to maintain IV access for protocol meds. Epinephrine 0.3 ml IV/IM PRN and Benadryl 25-50 IV/IM PRN s/s of anaphylaxis.   Instructions [facility] Infusion Coordinator (RN) to assist per patient IV care needs in the home PRN.      11/21/18 1109            Today   CHIEF COMPLAINT:  No acute issues overnight.  Creatinine has improved   VITAL SIGNS:  Blood pressure 133/86, pulse 88, temperature (!) 100.7 F (38.2 C), temperature source Oral, resp. rate 20, height 5\' 10"  (1.778 m), weight 69.3 kg, SpO2 100 %.   REVIEW OF SYSTEMS:  Review of Systems  Constitutional: Negative.  Negative for chills, fever and malaise/fatigue.   HENT: Negative.  Negative for ear discharge, ear pain, hearing loss, nosebleeds and sore throat.   Eyes: Negative.  Negative for blurred vision and pain.  Respiratory: Negative.  Negative for cough, hemoptysis, shortness of breath and wheezing.   Cardiovascular: Negative.  Negative for chest pain, palpitations and leg swelling.  Gastrointestinal: Negative.  Negative for abdominal pain, blood in stool, diarrhea, nausea and vomiting.  Genitourinary: Negative.  Negative for dysuria.  Musculoskeletal: Negative.  Negative for back pain.  Skin: Negative.   Neurological: Negative for dizziness, tremors, speech change, focal weakness, seizures and headaches.  Endo/Heme/Allergies: Negative.  Does not bruise/bleed easily.  Psychiatric/Behavioral: Negative.  Negative for depression, hallucinations and suicidal ideas.     PHYSICAL EXAMINATION:  GENERAL:  63 y.o.-year-old patient lying in the bed with no acute distress.  NECK:  Supple, no jugular venous distention. No thyroid enlargement, no tenderness.  LUNGS: Normal breath sounds bilaterally, no wheezing, rales,rhonchi  No use of accessory muscles of respiration.  CARDIOVASCULAR:  S1, S2 normal. No murmurs, rubs, or gallops.  ABDOMEN: Soft, non-tender, non-distended. Bowel sounds present. No organomegaly or mass.  EXTREMITIES: No pedal edema, cyanosis, or clubbing.  PSYCHIATRIC: The patient is alert and oriented x 3.  SKIN: No obvious rash, lesion, or ulcer.   DATA REVIEW:   CBC Recent Labs  Lab 11/20/18 0527  WBC 12.5*  HGB 8.2*  HCT 25.8*  PLT 293    Chemistries  Recent Labs  Lab 11/22/18 0445  NA 137  K 3.4*  CL 101  CO2 31  GLUCOSE 82  BUN 21  CREATININE 1.41*  CALCIUM 8.5*    Cardiac Enzymes No results for input(s): TROPONINI in the last 168 hours.  Microbiology Results  @MICRORSLT48 @  RADIOLOGY:  [place] Ekg Site Rite  Result Date: 11/21/2018 If Site Rite image not attached, placement could not be confirmed due to  current cardiac rhythm.     Allergies as of 11/22/2018      Reactions   5-alpha Reductase Inhibitors       Medication List    TAKE these medications   apixaban 5 MG Tabs tablet Commonly known as: ELIQUIS Take 1 tablet (5 mg total) by mouth 2 (two) times daily.   aspirin EC 81 MG tablet Take 81 mg by mouth daily.   bisacodyl 5 MG EC tablet Commonly known as: DULCOLAX Take 1 tablet (5 mg total) by mouth daily as needed for moderate constipation.   digoxin 0.25 MG tablet Commonly known as: LANOXIN Take 0.25 mg by mouth daily.   docusate sodium 100 MG capsule Commonly known as: COLACE Take 100 mg by mouth 2 (two) times daily.   finasteride 5 MG tablet Commonly known as: PROSCAR Take 1 tablet (5 mg total) by mouth daily.   gabapentin 300 MG capsule Commonly known as: NEURONTIN Take 300 mg by mouth 2 (two) times daily.   hydrOXYzine 25 MG tablet Commonly known as: ATARAX/VISTARIL Take 25 mg by mouth every 8 (eight) hours as needed for itching.   loratadine 10 MG tablet Commonly known as: CLARITIN Take 10 mg by mouth daily.   LORazepam 0.5 MG tablet Commonly known as: ATIVAN Take 1 tablet (0.5 mg total) by mouth daily.   metoprolol succinate 25 MG 24 hr tablet Commonly known as: TOPROL-XL Take 0.5 tablets (12.5 mg total) by mouth daily.   senna 8.6 MG Tabs tablet Commonly known as: SENOKOT Take 1 tablet (8.6 mg total) by mouth daily as needed for mild constipation.   vancomycin  IVPB Inject 1,000 mg into the vein daily for 21 days. Indication:  MRSA bacteremia Last Day of Therapy:  12/13/18 Labs: CBC with diff/ CMP and vanco trough every Monday while on antibiotics. Vanco trough and BMP every Thursday while on antibiotics. PICC care including placement of biopatch. Remove PICC at completion of treatment. Fax results to Dr. Delaine Lame at [phone]. Follow up appt with Dr. Delaine Lame in 2 weeks . Call [phone] for appt.  Dose to be adjusted according to  vanco trough and cr . Keep the trough between 15-20            Home Infusion Instuctions  (From admission, onward)         Start     Ordered   11/21/18 0000  Home infusion instructions [facility] May follow [place] Dosing Protocol; May administer Cathflo as needed to maintain patency of vascular access device.; Flushing of vascular access device: per [facility] Protocol: 0.9% NaCl pre/post medica.Marland KitchenMarland Kitchen  Question Answer Comment  Instructions May follow [place] Dosing Protocol   Instructions May administer Cathflo as needed to maintain patency of vascular access device.   Instructions Flushing of vascular access device: per [facility] Protocol: 0.9% NaCl pre/post medication administration and prn patency; Heparin 100 u/ml, 26ml for implanted ports and Heparin 10u/ml, 45ml for all other central venous catheters.   Instructions May follow [facility] Anaphylaxis Protocol for First Dose Administration in the home: 0.9% NaCl at 25-50 ml/hr to maintain IV access for protocol meds. Epinephrine 0.3 ml IV/IM PRN and Benadryl 25-50 IV/IM PRN s/s of anaphylaxis.   Instructions [facility] Infusion Coordinator (RN) to assist per patient IV care needs in the home PRN.      11/21/18 1109             Management plans discussed with the patient and he is in agreement. Stable for discharge   Patient should follow up with oncology  CODE STATUS:     Code Status Orders  (From admission, onward)         Start     Ordered   11/10/18 2157  Full code  Continuous     11/10/18 2157        Code Status History    Date Active Date Inactive Code Status Order ID Comments User Context   10/19/2018 0114 10/21/2018 2305 Full Code [id number]  Lance Coon, MD ED   10/07/2018 0442 10/11/2018 1817 Full Code [id number]  Mayer Camel, NP ED   08/02/2018 1502 08/09/2018 1743 Full Code [id number]  Mayo, Pete Pelt, MD Inpatient   Advance Care Planning Activity    Advance Directive Documentation     Most Recent  Value  Type of Advance Directive  Healthcare Power of Attorney, Living will  Pre-existing out of facility DNR order (yellow form or pink MOST form)  -  "MOST" Form in Place?  -      TOTAL TIME TAKING CARE OF THIS PATIENT: 39 minutes.    Note: This dictation was prepared with Dragon dictation along with smaller phrase technology. Any transcriptional errors that result from this process are unintentional.  Bettey Costa M.D on 11/22/2018 at 12:53 PM  Between 7am to 6pm - Pager - [phone] After 6pm go to [email or web address] - password [email or web address] [facility]  Office  [phone]  CC: Primary care physician; System, Pcp Not In

## 2018-11-21 NOTE — Progress Notes (Signed)
Spoke with Dr Benjie Karvonen re PICC order.  States she has approval from Dr Holley Raring and Dr Delaine Lame for PICC placement.Will attempt to contact Charlean Sanfilippo.  Notified PICC may be placed today or 11/22/18.

## 2018-11-21 NOTE — TOC Progression Note (Signed)
Transition of Care [facility]) - Progression Note    Patient Details  Name: Ruben Clark MRN: [id number] Date of Birth: 12-May-1955  Transition of Care [facility]) CM/SW Contact  [address] Lupita Rosales [place], [place] Phone Number: 11/21/2018, 11:46 AM  Clinical Narrative:    Phone call to patient's guardian representative listed [name] [phone] from [facility] to confirm bed offers. Voicemail left requesting a return call. Voice mail left on alternate number identified on voicemail as well [phone] requesting a return phone call.     Barriers to Discharge: Other (comment)(Unsure if [facility] home can take him back. Do not know if he has medicaid for long term care.  May need higher level)  Expected Discharge Plan and Services         Living arrangements for the past 2 months: [place], [place] Expected Discharge Date: 11/21/18                                     Social Determinants of Health (SDOH) Interventions    Readmission Risk Interventions Readmission Risk Prevention Plan 10/10/2018  Post Dischage Appt Complete  Medication Screening Complete  Transportation Screening Complete

## 2018-11-21 NOTE — Progress Notes (Signed)
[facility] [place], [place] 11/21/18  Subjective:  Renal function continues to be improving. Urine output was 5.3 L over the preceding 24 hours. Creatinine down to 1.39.   Objective:  Vital signs in last 24 hours:  Temp:  [97.8 F (36.6 C)-99.5 F (37.5 C)] 98.3 F (36.8 C) (08/16 0824) Pulse Rate:  [83-101] 85 (08/16 0824) Resp:  [19] 19 (08/16 0824) BP: (115-129)/(56-87) 115/56 (08/16 0824) SpO2:  [94 %-100 %] 95 % (08/16 0824) Weight:  [71.2 kg] 71.2 kg (08/16 0405)  Weight change: -7.348 kg Filed Weights   11/19/18 0329 11/20/18 0539 11/21/18 0405  Weight: 78.6 kg 78.6 kg 71.2 kg    Intake/Output:    Intake/Output Summary (Last 24 hours) at 11/21/2018 1353 Last data filed at 11/21/2018 1319 Gross per 24 hour  Intake 519.61 ml  Output 7200 ml  Net -6680.39 ml     Physical Exam: General:  Thin, cachectic gentleman, laying in the bed  HEENT  anicteric, moist oral mucous membranes  Neck  supple  Pulm/lungs  clear to auscultation, normal effort  CVS/Heart  no rub, S1S2  Abdomen:   soft, BS present  Extremities:  No edema  Neurologic:  Alert, oriented to person/place  Skin:  Dry skin    Basic Metabolic Panel:  Recent Labs  Lab 11/17/18 0639 11/18/18 0532 11/19/18 0757 11/20/18 0527 11/21/18 0455  NA 137 141 137 140 139  K 3.6 3.6 2.9* 3.3* 3.2*  CL 103 103 97* 98 100  CO2 24 27 30 35* 32  GLUCOSE 70 144* 152* 109* 83  BUN 57* 60* 51* 41* 27*  CREATININE 3.83* 3.32* 2.57* 1.96* 1.39*  CALCIUM 11.1* 10.4* 9.9 9.3 8.9     CBC: Recent Labs  Lab 11/16/18 0012 11/17/18 0639 11/18/18 0532 11/19/18 0757 11/20/18 0527  WBC 22.9* 23.1* 14.1* 13.0* 12.5*  HGB 9.6* 8.3* 8.1* 8.5* 8.2*  HCT 29.7* 25.5* 25.2* 26.7* 25.8*  MCV 89.5 88.2 88.4 88.7 89.0  PLT 404* 329 276 289 293      Lab Results  Component Value Date   HEPBSAG Negative 11/11/2018   HEPBSAB Non Reactive 11/11/2018      Microbiology:  Recent Results (from  the past 240 hour(s))  Urine Culture     Status: Abnormal   Collection Time: 11/13/18 11:21 PM   Specimen: Urine, Random  Result Value Ref Range Status   Specimen Description   Final    URINE, RANDOM Performed at [facility], [address] [place], [place] [address]    Special Requests   Final    NONE Performed at [facility], [address] [place], [place] [address]    Culture (A)  Final    >=100,000 COLONIES/mL METHICILLIN RESISTANT STAPHYLOCOCCUS AUREUS   Report Status 11/17/2018 FINAL  Final   Organism ID, Bacteria METHICILLIN RESISTANT STAPHYLOCOCCUS AUREUS (A)  Final      Susceptibility   Methicillin resistant staphylococcus aureus - MIC*    CIPROFLOXACIN >=8 RESISTANT Resistant     GENTAMICIN <=0.5 SENSITIVE Sensitive     NITROFURANTOIN <=16 SENSITIVE Sensitive     OXACILLIN >=4 RESISTANT Resistant     TETRACYCLINE <=1 SENSITIVE Sensitive     VANCOMYCIN 1 SENSITIVE Sensitive     TRIMETH/SULFA <=10 SENSITIVE Sensitive     CLINDAMYCIN >=8 RESISTANT Resistant     RIFAMPIN <=0.5 SENSITIVE Sensitive     Inducible Clindamycin NEGATIVE Sensitive     * >=100,000 COLONIES/mL METHICILLIN RESISTANT STAPHYLOCOCCUS AUREUS    CULTURE, BLOOD (ROUTINE X 2) w Reflex to ID Panel     Status: Abnormal   Collection Time: 11/14/18 10:53 AM   Specimen: BLOOD  Result Value Ref Range Status   Specimen Description   Final    BLOOD  L FOREARM Performed at [facility], [address] [place], [place] [address]    Special Requests   Final    BOTTLES DRAWN AEROBIC AND ANAEROBIC Blood Culture adequate volume Performed at [facility], [address] [place], [place] [address]    Culture  Setup Time   Final    GRAM POSITIVE COCCI AEROBIC BOTTLE ONLY CRITICAL VALUE NOTED.  VALUE IS CONSISTENT WITH PREVIOUSLY REPORTED AND CALLED VALUE. Performed at [facility], [address] [place], [place] [address]    Culture (A)  Final     STAPHYLOCOCCUS AUREUS SUSCEPTIBILITIES PERFORMED ON PREVIOUS CULTURE WITHIN THE LAST 5 DAYS. Performed at [facility], [place] [address], [place], [place] [address]    Report Status 11/17/2018 FINAL  Final  CULTURE, BLOOD (ROUTINE X 2) w Reflex to ID Panel     Status: Abnormal   Collection Time: 11/14/18 11:04 AM   Specimen: BLOOD  Result Value Ref Range Status   Specimen Description   Final    BLOOD L HAND Performed at [facility], [address], [place], [place] [address]    Special Requests   Final    BOTTLES DRAWN AEROBIC AND ANAEROBIC Blood Culture adequate volume Performed at [facility], [address] [place], [place] [address]    Culture  Setup Time   Final    GRAM POSITIVE COCCI IN BOTH AEROBIC AND ANAEROBIC BOTTLES CRITICAL RESULT CALLED TO, READ BACK BY AND VERIFIED WITH: CHARLES SHANLEVER AT 8144 ON 11/15/2018 [place]. Performed at [facility], [place] [address], [place], [place] [address]    Culture METHICILLIN RESISTANT STAPHYLOCOCCUS AUREUS (A)  Final   Report Status 11/17/2018 FINAL  Final   Organism ID, Bacteria METHICILLIN RESISTANT STAPHYLOCOCCUS AUREUS  Final      Susceptibility   Methicillin resistant staphylococcus aureus - MIC*    CIPROFLOXACIN >=8 RESISTANT Resistant     ERYTHROMYCIN >=8 RESISTANT Resistant     GENTAMICIN <=0.5 SENSITIVE Sensitive     OXACILLIN >=4 RESISTANT Resistant     TETRACYCLINE <=1 SENSITIVE Sensitive     VANCOMYCIN 1 SENSITIVE Sensitive     TRIMETH/SULFA <=10 SENSITIVE Sensitive     CLINDAMYCIN >=8 RESISTANT Resistant     RIFAMPIN <=0.5 SENSITIVE Sensitive     Inducible Clindamycin NEGATIVE Sensitive     * METHICILLIN RESISTANT STAPHYLOCOCCUS AUREUS  Blood Culture ID Panel (Reflexed)     Status: Abnormal   Collection Time: 11/14/18 11:04 AM  Result Value Ref Range Status   Enterococcus species NOT DETECTED NOT DETECTED Final   Listeria monocytogenes NOT DETECTED NOT DETECTED Final    Staphylococcus species DETECTED (A) NOT DETECTED Final    Comment: CRITICAL RESULT CALLED TO, READ BACK BY AND VERIFIED WITH: CHARLES SHANLEVER AT 3149 ON 11/15/2018 [place].    Staphylococcus aureus (BCID) DETECTED (A) NOT DETECTED Final    Comment: Methicillin (oxacillin)-resistant Staphylococcus aureus (MRSA). MRSA is predictably resistant to beta-lactam antibiotics (except ceftaroline). Preferred therapy is vancomycin unless clinically contraindicated. Patient requires contact precautions if  hospitalized. CRITICAL RESULT CALLED TO, READ BACK BY AND VERIFIED WITH: CHARLES SHANLEVER AT 7026 ON 11/15/2018 [place].    Methicillin resistance DETECTED (A) NOT DETECTED Final  Comment: CRITICAL RESULT CALLED TO, READ BACK BY AND VERIFIED WITH: CHARLES SHANLEVER AT 0738 ON 11/15/2018 [facility].    Streptococcus species NOT DETECTED NOT DETECTED Final   Streptococcus agalactiae NOT DETECTED NOT DETECTED Final   Streptococcus pneumoniae NOT DETECTED NOT DETECTED Final   Streptococcus pyogenes NOT DETECTED NOT DETECTED Final   Acinetobacter baumannii NOT DETECTED NOT DETECTED Final   Enterobacteriaceae species NOT DETECTED NOT DETECTED Final   Enterobacter cloacae complex NOT DETECTED NOT DETECTED Final   Escherichia coli NOT DETECTED NOT DETECTED Final   Klebsiella oxytoca NOT DETECTED NOT DETECTED Final   Klebsiella pneumoniae NOT DETECTED NOT DETECTED Final   Proteus species NOT DETECTED NOT DETECTED Final   Serratia marcescens NOT DETECTED NOT DETECTED Final   Haemophilus influenzae NOT DETECTED NOT DETECTED Final   Neisseria meningitidis NOT DETECTED NOT DETECTED Final   Pseudomonas aeruginosa NOT DETECTED NOT DETECTED Final   Candida albicans NOT DETECTED NOT DETECTED Final   Candida glabrata NOT DETECTED NOT DETECTED Final   Candida krusei NOT DETECTED NOT DETECTED Final   Candida parapsilosis NOT DETECTED NOT DETECTED Final   Candida tropicalis NOT DETECTED NOT DETECTED Final    Comment:  Performed at [facility], [address] [place], [place] [address]  CULTURE, BLOOD (ROUTINE X 2) w Reflex to ID Panel     Status: None   Collection Time: 11/16/18 12:05 AM   Specimen: BLOOD  Result Value Ref Range Status   Specimen Description BLOOD LEFT ASSIST CONTROL  Final   Special Requests   Final    BOTTLES DRAWN AEROBIC AND ANAEROBIC Blood Culture results may not be optimal due to an excessive volume of blood received in culture bottles   Culture   Final    NO GROWTH 5 DAYS Performed at [facility], [address] [place], [place] [address]    Report Status 11/21/2018 FINAL  Final  CULTURE, BLOOD (ROUTINE X 2) w Reflex to ID Panel     Status: None   Collection Time: 11/16/18 12:11 AM   Specimen: BLOOD  Result Value Ref Range Status   Specimen Description BLOOD LEFT HAND  Final   Special Requests   Final    BOTTLES DRAWN AEROBIC AND ANAEROBIC Blood Culture adequate volume   Culture   Final    NO GROWTH 5 DAYS Performed at [facility], [address] , [place] [address]    Report Status 11/21/2018 FINAL  Final    Coagulation Studies: No results for input(s): LABPROT, INR in the last 72 hours.  Urinalysis: No results for input(s): COLORURINE, LABSPEC, PHURINE, GLUCOSEU, HGBUR, BILIRUBINUR, KETONESUR, PROTEINUR, UROBILINOGEN, NITRITE, LEUKOCYTESUR in the last 72 hours.  Invalid input(s): APPERANCEUR    Imaging: [place] Ekg Site Rite  Result Date: 11/21/2018 If Site Rite image not attached, placement could not be confirmed due to current cardiac rhythm.    Medications:   . potassium chloride 10 mEq (11/21/18 1334)  .  sodium bicarbonate (isotonic) infusion in sterile water 100 mL/hr at 11/21/18 0022  . vancomycin 1,250 mg (11/21/18 1159)   . apixaban  5 mg Oral BID  . aspirin EC  81 mg Oral Daily  . digoxin  0.25 mg Oral Daily  . docusate sodium  100 mg Oral BID  . finasteride  5 mg Oral Daily  . folic acid  1 mg Oral  Daily  . gabapentin  300 mg Oral BID  . loratadine  10 mg Oral Daily  .   LORazepam  0.5 mg Oral Daily  . metoprolol succinate  12.5 mg Oral Daily  . vancomycin variable dose per unstable renal function (pharmacist dosing)   Does not apply See admin instructions   acetaminophen **OR** acetaminophen, bisacodyl, hydrOXYzine, ondansetron **OR** ondansetron (ZOFRAN) IV, senna, traZODone  Assessment/ Plan:  63 y.o. male with medical problems of ch sys CHF, CKD, A Fib, HTN who is a resident of group home is admitted to [facility] on 11/10/2018 for evaluation of weakness, weight loss. Had left femoral neck fracture in 10/2018  1. AKI, bilateral hydronephrosis 2. Hypercalcemia 3. Weight loss, cachexia 4. Anemia 5.  Bladder mass 6.  Metabolic acidosis.  Plan: Patient continues to improve slowly.  Creatinine now down to 1.39 with an EGFR 54.  Urine output was 5.3 L over the preceding 24 hours.  Potassium noted to be low today however patient is receiving repletion.  Patient has agreed upon immunotherapy as an outpatient.  He will be seeing oncology as an outpatient.  Recommend monitoring renal function periodically as an outpatient.  No indication for nephrostomy at this point time as a patient is having good urine output.  LOS: 11   8/16/20201:53 PM  [facility] [place], [place] [phone]  Note: This note was prepared with Dragon dictation. Any transcription errors are unintentional  

## 2018-11-21 NOTE — Progress Notes (Signed)
Spoke with Manuela Schwartz RN e PICC consent.  States unable to contact guardian for consent.  RN to attempt to contact LG. IF unable, will attempt again 11/22/18.

## 2018-11-21 NOTE — Progress Notes (Addendum)
PHARMACY CONSULT NOTE FOR (ADDENDUM:  OUTPATIENT  PARENTERAL ANTIBIOTIC THERAPY (OPAT)  Indication: MRSA bacteremia  Regimen: vancomycin 1000 mg IV q24h End date: 12/13/18  IV antibiotic discharge orders are pended. To discharging provider:  please sign these orders via discharge navigator,  Select New Orders & click on the button choice - Manage This Unsigned Work.     Thank you for allowing pharmacy to be a part of this patient's care.  Doreene Eland, PharmD, BCPS.   Work Cell: [phone] 11/22/2018 12:40 PM

## 2018-11-21 NOTE — Progress Notes (Signed)
Efforts to contact Ruben Clark, pt's guardian, inorder to get permission for picc line placement. Message left on contact line [phone]. Awaiting call back

## 2018-11-21 NOTE — Plan of Care (Signed)
?  Problem: Clinical Measurements: ?Goal: Diagnostic test results will improve ?Outcome: Progressing ?  ?Problem: Safety: ?Goal: Ability to remain free from injury will improve ?Outcome: Progressing ?  ?

## 2018-11-21 NOTE — Progress Notes (Signed)
Pharmacy Antibiotic Note  Ruben Clark is a 63 y.o. male admitted on 11/10/2018 with sepsis.  Pharmacy has been consulted for Vancomycin. Cefepime was discontinued d/t blood cultures. However, it was briefly restarted d/t concerns of his blood pressure being low and leucocytosis. After bladder wash out and release of purulent fluid, WBC has down trended.  Plan: Renal function continues to improve. Will initiate vancomycin 1250mg  IV Q24hr. Patient is being transferred SNF.    Height: 5\' 10"  (177.8 cm) Weight: 157 lb (71.2 kg) IBW/kg (Calculated) : 73  Temp (24hrs), Avg:98.4 F (36.9 C), Min:97.8 F (36.6 C), Max:99.5 F (37.5 C)  Recent Labs  Lab 11/16/18 0012  11/17/18 0639 11/18/18 0532  11/19/18 0757 11/20/18 0527 11/20/18 1021 11/21/18 0455 11/21/18 0909  WBC 22.9*  --  23.1* 14.1*  --  13.0* 12.5*  --   --   --   CREATININE 3.25*  --  3.83* 3.32*  --  2.57* 1.96*  --  1.39*  --   VANCORANDOM  --    < >  --   --    < >  --   --  18  --  20   < > = values in this interval not displayed.    Estimated Creatinine Clearance: 55.5 mL/min (A) (by C-G formula based on SCr of 1.39 mg/dL (H)).    Allergies  Allergen Reactions  . 5-Alpha Reductase Inhibitors     Antimicrobials this admission: Vancomycin  8/9 >>  Cefepime 8/9 >> 8/10; 8/12 >> 8/13  Microbiology: 8/8 Urine: MRSA 8/9 Blood: MRSA 8/11 Blood: NG 2 days  Thank you for allowing pharmacy to be a part of this patient's care.  Rayna Sexton, PharmD, BCPS Clinical Pharmacist 11/21/2018 11:04 AM

## 2018-11-22 ENCOUNTER — Inpatient Hospital Stay: Payer: Medicare HMO

## 2018-11-22 DIAGNOSIS — Z95828 Presence of other vascular implants and grafts: Secondary | ICD-10-CM

## 2018-11-22 DIAGNOSIS — Z8614 Personal history of Methicillin resistant Staphylococcus aureus infection: Secondary | ICD-10-CM

## 2018-11-22 DIAGNOSIS — R509 Fever, unspecified: Secondary | ICD-10-CM

## 2018-11-22 DIAGNOSIS — C678 Malignant neoplasm of overlapping sites of bladder: Secondary | ICD-10-CM

## 2018-11-22 LAB — BASIC METABOLIC PANEL
Anion gap: 5 (ref 5–15)
BUN: 21 mg/dL (ref 8–23)
CO2: 31 mmol/L (ref 22–32)
Calcium: 8.5 mg/dL — ABNORMAL LOW (ref 8.9–10.3)
Chloride: 101 mmol/L (ref 98–111)
Creatinine, Ser: 1.41 mg/dL — ABNORMAL HIGH (ref 0.61–1.24)
GFR calc Af Amer: >60 mL/min (ref >60)
GFR calc non Af Amer: 53 mL/min — ABNORMAL LOW (ref >60)
Glucose, Bld: 82 mg/dL (ref 70–99)
Potassium: 3.4 mmol/L — ABNORMAL LOW (ref 3.5–5.1)
Sodium: 137 mmol/L (ref 135–145)

## 2018-11-22 MED ORDER — VANCOMYCIN IV (FOR PTA / DISCHARGE USE ONLY): 1000.0000 mg | INTRAVENOUS | 0 refills | Status: DC

## 2018-11-22 MED ORDER — POTASSIUM CHLORIDE CRYS ER 20 MEQ PO TBCR: 20.0000 meq | EXTENDED_RELEASE_TABLET | Freq: Once | ORAL | Status: DC

## 2018-11-22 MED ORDER — VANCOMYCIN HCL IN DEXTROSE 1-5 GM/200ML-% IV SOLN
1000.0000 mg | INTRAVENOUS | Status: DC
  Administered 2018-11-22: 1000 mg via INTRAVENOUS
  Filled 2018-11-22 (×2): qty 200

## 2018-11-22 MED ORDER — POLYETHYLENE GLYCOL 3350 17 G PO PACK: 17.0000 g | PACK | Freq: Once | ORAL | Status: DC

## 2018-11-22 MED ORDER — SODIUM CHLORIDE 0.9% FLUSH
10.0000 mL | Freq: Two times a day (BID) | INTRAVENOUS | Status: DC
  Administered 2018-11-23: 10 mL

## 2018-11-22 MED ORDER — SODIUM CHLORIDE 0.9% FLUSH: 10.0000 mL | INTRAVENOUS | Status: DC | PRN

## 2018-11-22 NOTE — Progress Notes (Signed)
[name] NAME: Ruben Clark    MR#:  [id number]  DATE OF BIRTH:  1956-02-26  SUBJECTIVE:   Plan for picc today No evens overnight   REVIEW OF SYSTEMS:    Review of Systems  Constitutional: Negative for fever, chills weight loss HENT: Negative for ear pain, nosebleeds, congestion, facial swelling, rhinorrhea, neck pain, neck stiffness and ear discharge.   Respiratory: Negative for cough, shortness of breath, wheezing  Cardiovascular: Negative for chest pain, palpitations and leg swelling.  Gastrointestinal: Negative for heartburn, abdominal pain, vomiting, diarrhea or consitpation Genitourinary: Negative for dysuria, urgency, frequency, hematuria Musculoskeletal: Negative for back pain or joint pain Neurological: Negative for dizziness, seizures, syncope, focal weakness,  numbness and headaches.  Hematological: Does not bruise/bleed easily.  Psychiatric/Behavioral: Negative for hallucinations, confusion, dysphoric mood    Tolerating Diet: yes      DRUG ALLERGIES:   Allergies  Allergen Reactions  . 5-Alpha Reductase Inhibitors     VITALS:  Blood pressure 133/86, pulse 88, temperature (!) 100.7 F (38.2 C), temperature source Oral, resp. rate 20, height 5\' 10"  (1.778 m), weight 69.3 kg, SpO2 100 %.  PHYSICAL EXAMINATION:  Constitutional: Appears frail. No distress. HENT: Normocephalic. Marland Kitchen Oropharynx is clear and moist.  Eyes: Conjunctivae and EOM are normal. PERRLA, no scleral icterus.  Neck: Normal ROM. Neck supple. No JVD. No tracheal deviation. CVS: RRR, S1/S2 +, no murmurs, no gallops, no carotid bruit.  Pulmonary: Effort and breath sounds normal, no stridor, rhonchi, wheezes, rales.  Abdominal: Soft. BS +,  no distension, tenderness, rebound or guarding.  Musculoskeletal: Normal range of motion. No edema and no tenderness.  Neuro: Alert. CN 2-12 grossly intact. No focal deficits. Skin: Skin is warm and dry. No  rash noted. Psychiatric: Normal mood and affect.      LABORATORY PANEL:   CBC Recent Labs  Lab 11/20/18 0527  WBC 12.5*  HGB 8.2*  HCT 25.8*  PLT 293   ------------------------------------------------------------------------------------------------------------------  Chemistries  Recent Labs  Lab 11/22/18 0445  NA 137  K 3.4*  CL 101  CO2 31  GLUCOSE 82  BUN 21  CREATININE 1.41*  CALCIUM 8.5*   ------------------------------------------------------------------------------------------------------------------  Cardiac Enzymes No results for input(s): TROPONINI in the last 168 hours. ------------------------------------------------------------------------------------------------------------------  RADIOLOGY:  [place] Ekg Site Rite  Result Date: 11/21/2018 If Site Rite image not attached, placement could not be confirmed due to current cardiac rhythm.    ASSESSMENT AND PLAN:   63 year old male with chronic systolic heart failure, PAF and squamous cell carcinoma of the bladder who presented to the emergency room due to generalized weakness.   1. Acute kidney and chronic kidney disease stage III due to bilateral hydronephrosis from obstruction: Creatinine has improved. Patient will need Foley catheter at discharge. Ureters could not be stented and plan was possible bilateral nephrostomy. However it appears that there is not an urgent need for nephrostomy given at renal function is improving. He will need outpatient urology follow-up. 2. MRSA bacteremia: Source is from urinary tract from the bladder tumor. Repeat blood cultures are negative. 2D echocardiogram did not show vegetation. Patient does not want TEE. Patient is currently on vancomycin. Patient with PICC line and treatment for 4 to 6 weeks. PICC/Vancomycin protocol and outpatient follow up with Dr Delaine Lame..  3. PAF: Continue digoxin and metoprolol  4. Hyperglycemia due to acute kidney injury  which is resolved  5. Metabolic encephalopathy: Patient is at baseline  6. 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 Patient underwent TURBT on August 12. He will follow-up with Dr. Richard Miu plans discussed with the patient and he is in agreement.  CODE STATUS: full Does not want hospice  TOTAL TIME TAKING CARE OF THIS PATIENT: 30 minutes.     POSSIBLE D/C today SNF, DEPENDING ON CLINICAL CONDITION.   Bettey Costa M.D on 11/21/2018 at 8:34 AM  Between 7am to 6pm - Pager - [phone] After 6pm go to [email or web address] - password [email or web address] [facility]  Office  [phone]  CC: Primary care physician; System, Pcp Not In  Note: This dictation was prepared with Dragon dictation along with smaller phrase technology. Any transcriptional errors that result from this process are unintentional.

## 2018-11-22 NOTE — NC FL2 (Signed)
[place] LEVEL OF CARE SCREENING TOOL     IDENTIFICATION  Patient Name: Ruben Clark Birthdate: Feb 14, 1956 Sex: male Admission Date (Current Location): 11/10/2018  [place] and [place] Number:  Engineering geologist and Address:  [facility], [address], [place], [place] [address]      Provider Number: [id number]  Attending Physician Name and Address:  Bettey Costa, MD  Relative Name and Phone Number:  [name] Legal Guardian [phone]    Current Level of Care: Hospital Recommended Level of Care: [place] Prior Approval Number:    Date Approved/Denied:   PASRR Number: [id number]  Discharge Plan: SNF    Current Diagnoses: Patient Active Problem List   Diagnosis Date Noted  . Hypercalcemia 11/10/2018  . Closed left hip fracture ([place]) 10/19/2018  . Chronic systolic CHF (congestive heart failure) ([place]) 10/19/2018  . HTN (hypertension) 10/19/2018  . Sepsis ([place]) 10/07/2018  . COPD (chronic obstructive pulmonary disease) ([place]) 08/26/2018  . Atrial fibrillation ([place]) 08/26/2018  . Left foot infection 08/02/2018    Orientation RESPIRATION BLADDER Height & Weight     Self, Place, Situation  Normal Continent Weight: 152 lb 11.2 oz (69.3 kg) Height:  5\' 10"  (177.8 cm)  BEHAVIORAL SYMPTOMS/MOOD NEUROLOGICAL BOWEL NUTRITION STATUS  (none) (none) Continent Diet(Cardiac diet)  AMBULATORY STATUS COMMUNICATION OF NEEDS Skin   Limited Assist Verbally Normal, Surgical wounds                       Personal Care Assistance Level of Assistance  Bathing, Feeding, Dressing Bathing Assistance: Limited assistance Feeding assistance: Independent Dressing Assistance: Limited assistance     Functional Limitations Info  Sight, Hearing, Speech Sight Info: Adequate Hearing Info: Adequate Speech Info: Adequate    SPECIAL CARE FACTORS FREQUENCY  PT (By licensed PT), OT (By licensed OT)     PT Frequency: 5x a week OT  Frequency: 5x a week            Contractures Contractures Info: Not present    Additional Factors Info  Code Status, Allergies, Psychotropic, Isolation Precautions Code Status Info: full Allergies Info: 5-alpha Reductase Inhibitors Psychotropic Info: LORazepam (ATIVAN) tablet 0.5 mg   Isolation Precautions Info: Contact precautions due to MRSA     Current Medications (11/22/2018):  This is the current hospital active medication list Current Facility-Administered Medications  Medication Dose Route Frequency Provider Last Rate Last Dose  . acetaminophen (TYLENOL) tablet 650 mg  650 mg Oral Q6H PRN Saundra Shelling, MD   650 mg at 11/18/18 2000   Or  . acetaminophen (TYLENOL) suppository 650 mg  650 mg Rectal Q6H PRN Saundra Shelling, MD      . apixaban (ELIQUIS) tablet 5 mg  5 mg Oral BID Saundra Shelling, MD   5 mg at 11/22/18 1031  . aspirin EC tablet 81 mg  81 mg Oral Daily Pyreddy, Reatha Harps, MD   81 mg at 11/22/18 1030  . bisacodyl (DULCOLAX) EC tablet 5 mg  5 mg Oral Daily PRN Saundra Shelling, MD      . digoxin (LANOXIN) tablet 0.25 mg  0.25 mg Oral Daily Pyreddy, Pavan, MD   0.25 mg at 11/22/18 1029  . docusate sodium (COLACE) capsule 100 mg  100 mg Oral BID Saundra Shelling, MD   100 mg at 11/22/18 1030  . finasteride (PROSCAR) tablet 5 mg  5 mg Oral Daily Pyreddy, Pavan, MD   5 mg at 37/90/24 0973  . folic  acid (FOLVITE) tablet 1 mg  1 mg Oral Daily Corcoran, Melissa C, MD   1 mg at 11/22/18 1030  . gabapentin (NEURONTIN) capsule 300 mg  300 mg Oral BID Saundra Shelling, MD   300 mg at 11/22/18 1029  . hydrOXYzine (ATARAX/VISTARIL) tablet 25 mg  25 mg Oral Q8H PRN Saundra Shelling, MD   25 mg at 11/20/18 1854  . loratadine (CLARITIN) tablet 10 mg  10 mg Oral Daily Pyreddy, Pavan, MD   10 mg at 11/22/18 1030  . LORazepam (ATIVAN) tablet 0.5 mg  0.5 mg Oral Daily Pyreddy, Pavan, MD   0.5 mg at 11/22/18 1030  . metoprolol succinate (TOPROL-XL) 24 hr tablet 12.5 mg  12.5 mg Oral Daily Pyreddy,  Pavan, MD   12.5 mg at 11/22/18 1029  . ondansetron (ZOFRAN) tablet 4 mg  4 mg Oral Q6H PRN Saundra Shelling, MD       Or  . ondansetron (ZOFRAN) injection 4 mg  4 mg Intravenous Q6H PRN Pyreddy, Reatha Harps, MD      . senna (SENOKOT) tablet 8.6 mg  1 tablet Oral Daily PRN Pyreddy, Reatha Harps, MD      . sodium bicarbonate 150 mEq in sterile water 1,000 mL infusion   Intravenous Continuous Saundra Shelling, MD 100 mL/hr at 11/22/18 1514    . sodium chloride flush (NS) 0.9 % injection 10-40 mL  10-40 mL Intracatheter Q12H Corcoran, Melissa C, MD      . sodium chloride flush (NS) 0.9 % injection 10-40 mL  10-40 mL Intracatheter PRN Corcoran, Melissa C, MD      . traZODone (DESYREL) tablet 100 mg  100 mg Oral QHS PRN Mayer Camel, NP   100 mg at 11/21/18 2119  . vancomycin (VANCOCIN) IVPB 1000 mg/200 mL premix  1,000 mg Intravenous Q24H Berton Mount, RPH 200 mL/hr at 11/22/18 1518 1,000 mg at 11/22/18 1518  . vancomycin variable dose per unstable renal function (pharmacist dosing)   Does not apply See admin instructions [name], [facility]         Discharge Medications: Please see discharge summary for a list of discharge medications.  Relevant Imaging Results:  Relevant Lab Results:   Additional Information SSN: [id number]  Patient will need three weeks of IV antibiotics vancomycin, end date of Sept 7 per infectious disease.  Ross Ludwig, LCSW

## 2018-11-22 NOTE — Progress Notes (Addendum)
PHARMACY CONSULT NOTE FOR (ADDENDUM):  OUTPATIENT  PARENTERAL ANTIBIOTIC THERAPY (OPAT)  Indication: MRSA bacteremia  Regimen: vancomycin 1250 mg IV q24h End date: 12/13/18  IV antibiotic discharge orders are pended. To discharging provider:  please sign these orders via discharge navigator,  Select New Orders & click on the button choice - Manage This Unsigned Work.     Thank you for allowing pharmacy to be a part of this patient's care.  Doreene Eland, PharmD, BCPS.   Work Cell: [phone] 11/22/2018 12:41 PM

## 2018-11-22 NOTE — Progress Notes (Signed)
PICC RN unable to get patient legal guardian on the phone. Patient RN notified of in ablility to get consent from legal guardian for PICC placement.Patient RN verbalized getting in contact with social services to obtain consent today.  PICC RN to follow.

## 2018-11-22 NOTE — Progress Notes (Signed)
Date of Admission:  11/10/2018    Today 11/22/18   ID: Jassiel Flye is a 63 y.o. male  Active Problems:   Hypercalcemia MRSA bacteremia Bladder malignancy B/l hydroureteronephrosis    Subjective: Pt says he has no new issues No fever No pain abdomen Eating   Medications:  . apixaban  5 mg Oral BID  . aspirin EC  81 mg Oral Daily  . digoxin  0.25 mg Oral Daily  . docusate sodium  100 mg Oral BID  . finasteride  5 mg Oral Daily  . folic acid  1 mg Oral Daily  . gabapentin  300 mg Oral BID  . loratadine  10 mg Oral Daily  . LORazepam  0.5 mg Oral Daily  . metoprolol succinate  12.5 mg Oral Daily  . polyethylene glycol  17 g Oral Once  . potassium chloride  20 mEq Oral Once  . sodium chloride flush  10-40 mL Intracatheter Q12H  . vancomycin variable dose per unstable renal function (pharmacist dosing)   Does not apply See admin instructions    Objective: Vital signs in last 24 hours: Patient Vitals for the past 24 hrs:  BP Temp Temp src Pulse Resp SpO2 Weight  11/22/18 1645 94/69 - - 84 - 100 % -  11/22/18 1643 (!) 120/107 99.2 F (37.3 C) Oral (!) 55 (!) 23 97 % -  11/22/18 0724 133/86 (!) 100.7 F (38.2 C) Oral 88 20 100 % -  11/22/18 0500 - - - - - - 69.3 kg  11/22/18 0459 118/71 98.5 F (36.9 C) Oral 93 16 98 % -  11/21/18 1952 106/65 98.6 F (37 C) Oral (!) 107 16 93 % -    PHYSICAL EXAM:  General: Alert, cooperative, no distress,  Lungs: b/l air entry Heart: irregular Abdomen: Soft, non-tender,not distended. Bowel sounds normal. No masses Foley catheter  Extremities: rt picc Skin: No rashes or lesions. Or bruising Lymph: Cervical, supraclavicular normal. Neurologic: Grossly non-focal  Urine total output- 24 hrs 3163ml Lab Results Recent Labs    11/20/18 0527 11/21/18 0455 11/22/18 0445  WBC 12.5*  --   --   HGB 8.2*  --   --   HCT 25.8*  --   --   NA 140 139 137  K 3.3* 3.2* 3.4*  CL 98 100 101  CO2 35* 32 31  BUN 41* 27* 21  CREATININE  1.96* 1.39* 1.41*    Liver Panel No results for input(s): PROT, ALBUMIN, AST, ALT, ALKPHOS, BILITOT, BILIDIR, IBILI in the last 72 hours. Sedimentation Rate No results for input(s): ESRSEDRATE in the last 72 hours. C-Reactive Protein No results for input(s): CRP in the last 72 hours.  Microbiology: Blood culture MRSA from 11/14/18 11/16/18-Ng so far Studies/Results: Dg Chest Port 1 View  Result Date: 11/22/2018 CLINICAL DATA:  Status post PICC placement. EXAM: PORTABLE CHEST 1 VIEW COMPARISON:  11/10/2018. FINDINGS: Right PICC tip poorly visualized, most likely in the region of the superior cavoatrial junction. Stable borderline enlarged cardiac silhouette and clear lungs with normal vascularity. Unremarkable bones. IMPRESSION: 1. Right PICC tip poorly visualized, most likely in the region of the superior cavoatrial junction. 2. Stable borderline cardiomegaly. Electronically Signed   By: Claudie Revering M.D.   On: 11/22/2018 14:45   [place] Ekg Site Rite  Result Date: 11/21/2018 If Site Rite image not attached, placement could not be confirmed due to current cardiac rhythm.    Assessment/Plan: MRSA bacteremia.  Source likely urinary tract  infection from the significant bladder tumor.  He had urine positive for MRSA during last admission when the blood culture was negative.  Repeat blood culture from 811 no growth so far.  2D echo did not show any specific vegetations.  Patient refused TEE.  Currently on vancomycin 1 g once a day..  Levels being monitored closely because of creatinine levels .  We need 4 to 6 weeks of IV antibiotic.  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  Underwent TURBT on 11/17/2018.  WBC has decreased since then.  Creatinine is also significantly improved.  Initially there was a plan to do bilateral nephrostomies as the ureters could not be stented.  But as the creatinine is improving and his urine output is  excellent he may not need any now.  Followed by Dr. Bernardo Heater.  A. fib on digoxin and metoprolol  Hypercalcemia resolved  Metabolic encephalopathy resolved  New low-grade fever today watch closely.  We will get blood culture if needed.  Discussed the management with the patient and the pharmacist.

## 2018-11-22 NOTE — Progress Notes (Signed)
[facility]  ROUNDING NOTE   Subjective:   PICC for today.   Being transferred to [facility] of IV antibiotics and rehab.   Objective:  Vital signs in last 24 hours:  Temp:  [98.5 F (36.9 C)-100.7 F (38.2 C)] 100.7 F (38.2 C) (08/17 0724) Pulse Rate:  [79-107] 88 (08/17 0724) Resp:  [16-20] 20 (08/17 0724) BP: (106-133)/(65-86) 133/86 (08/17 0724) SpO2:  [93 %-100 %] 100 % (08/17 0724) Weight:  [69.3 kg] 69.3 kg (08/17 0500)  Weight change: -1.95 kg Filed Weights   11/20/18 0539 11/21/18 0405 11/22/18 0500  Weight: 78.6 kg 71.2 kg 69.3 kg    Intake/Output: I/O last 3 completed shifts: In: 3168.4 [I.V.:2927; IV Piggyback:241.4] Out: 0258 [Urine:5925]   Intake/Output this shift:  No intake/output data recorded.  Physical Exam: General: NAD, laying in bed  Head: Normocephalic, atraumatic. Moist oral mucosal membranes  Eyes: Anicteric, PERRL  Neck: Supple, trachea midline  Lungs:  Clear to auscultation  Heart: irregular  Abdomen:  Soft, nontender  Extremities:  no edema, bilateral toe amputations  Neurologic: Alert, difficult to understand  Skin: No lesions  GU: foley    Basic Metabolic Panel: Recent Labs  Lab 11/18/18 0532 11/19/18 0757 11/20/18 0527 11/21/18 0455 11/22/18 0445  NA 141 137 140 139 137  K 3.6 2.9* 3.3* 3.2* 3.4*  CL 103 97* 98 100 101  CO2 27 30 35* 32 31  GLUCOSE 144* 152* 109* 83 82  BUN 60* 51* 41* 27* 21  CREATININE 3.32* 2.57* 1.96* 1.39* 1.41*  CALCIUM 10.4* 9.9 9.3 8.9 8.5*    Liver Function Tests: No results for input(s): AST, ALT, ALKPHOS, BILITOT, PROT, ALBUMIN in the last 168 hours. No results for input(s): LIPASE, AMYLASE in the last 168 hours. No results for input(s): AMMONIA in the last 168 hours.  CBC: Recent Labs  Lab 11/16/18 0012 11/17/18 0639 11/18/18 0532 11/19/18 0757 11/20/18 0527  WBC 22.9* 23.1* 14.1* 13.0* 12.5*  HGB 9.6* 8.3* 8.1* 8.5* 8.2*  HCT 29.7* 25.5* 25.2* 26.7*  25.8*  MCV 89.5 88.2 88.4 88.7 89.0  PLT 404* 329 276 289 293    Cardiac Enzymes: No results for input(s): CKTOTAL, CKMB, CKMBINDEX, TROPONINI in the last 168 hours.  BNP: Invalid input(s): POCBNP  CBG: No results for input(s): GLUCAP in the last 168 hours.  Microbiology: Results for orders placed or performed during the hospital encounter of 11/10/18  SARS Coronavirus 2 [facility] order, Performed in [facility] hospital lab) Nasopharyngeal Nasopharyngeal Swab     Status: None   Collection Time: 11/10/18  6:40 PM   Specimen: Nasopharyngeal Swab  Result Value Ref Range Status   SARS Coronavirus 2 NEGATIVE NEGATIVE Final    Comment: (NOTE) If result is NEGATIVE SARS-CoV-2 target nucleic acids are NOT DETECTED. The SARS-CoV-2 RNA is generally detectable in upper and lower  respiratory specimens during the acute phase of infection. The lowest  concentration of SARS-CoV-2 viral copies this assay can detect is 250  copies / mL. A negative result does not preclude SARS-CoV-2 infection  and should not be used as the sole basis for treatment or other  patient management decisions.  A negative result may occur with  improper specimen collection / handling, submission of specimen other  than nasopharyngeal swab, presence of viral mutation(s) within the  areas targeted by this assay, and inadequate number of viral copies  (<250 copies / mL). A negative result must be combined with clinical  observations, patient history,  and epidemiological information. If result is POSITIVE SARS-CoV-2 target nucleic acids are DETECTED. The SARS-CoV-2 RNA is generally detectable in upper and lower  respiratory specimens dur ing the acute phase of infection.  Positive  results are indicative of active infection with SARS-CoV-2.  Clinical  correlation with patient history and other diagnostic information is  necessary to determine patient infection status.  Positive results do  not rule out bacterial  infection or co-infection with other viruses. If result is PRESUMPTIVE POSTIVE SARS-CoV-2 nucleic acids MAY BE PRESENT.   A presumptive positive result was obtained on the submitted specimen  and confirmed on repeat testing.  While 2019 novel coronavirus  (SARS-CoV-2) nucleic acids may be present in the submitted sample  additional confirmatory testing may be necessary for epidemiological  and / or clinical management purposes  to differentiate between  SARS-CoV-2 and other Sarbecovirus currently known to infect humans.  If clinically indicated additional testing with an alternate test  methodology [id number]) is advised. The SARS-CoV-2 RNA is generally  detectable in upper and lower respiratory sp ecimens during the acute  phase of infection. The expected result is Negative. Fact Sheet for Patients:  StrictlyIdeas.no Fact Sheet for Healthcare Providers: BankingDealers.co.za This test is not yet approved or cleared by the [place] FDA and has been authorized for detection and/or diagnosis of SARS-CoV-2 by FDA under an Emergency Use Authorization (EUA).  This EUA will remain in effect (meaning this test can be used) for the duration of the COVID-19 declaration under Section 564(b)(1) of the Act, 21 U.S.C. section 360bbb-3(b)(1), unless the authorization is terminated or revoked sooner. Performed at [facility], [address] [place], [place] [address]   Urine Culture     Status: Abnormal   Collection Time: 11/13/18 11:21 PM   Specimen: Urine, Random  Result Value Ref Range Status   Specimen Description   Final    URINE, RANDOM Performed at [facility], [address] [place], [place] [address]    Special Requests   Final    NONE Performed at [facility], [address] [place], [place] [address]    Culture (A)  Final    >=100,000 COLONIES/mL METHICILLIN RESISTANT STAPHYLOCOCCUS AUREUS    Report Status 11/17/2018 FINAL  Final   Organism ID, Bacteria METHICILLIN RESISTANT STAPHYLOCOCCUS AUREUS (A)  Final      Susceptibility   Methicillin resistant staphylococcus aureus - MIC*    CIPROFLOXACIN >=8 RESISTANT Resistant     GENTAMICIN <=0.5 SENSITIVE Sensitive     NITROFURANTOIN <=16 SENSITIVE Sensitive     OXACILLIN >=4 RESISTANT Resistant     TETRACYCLINE <=1 SENSITIVE Sensitive     VANCOMYCIN 1 SENSITIVE Sensitive     TRIMETH/SULFA <=10 SENSITIVE Sensitive     CLINDAMYCIN >=8 RESISTANT Resistant     RIFAMPIN <=0.5 SENSITIVE Sensitive     Inducible Clindamycin NEGATIVE Sensitive     * >=100,000 COLONIES/mL METHICILLIN RESISTANT STAPHYLOCOCCUS AUREUS  CULTURE, BLOOD (ROUTINE X 2) w Reflex to ID Panel     Status: Abnormal   Collection Time: 11/14/18 10:53 AM   Specimen: BLOOD  Result Value Ref Range Status   Specimen Description   Final    BLOOD  L FOREARM Performed at [facility], [address], [place], [place] [address]    Special Requests   Final    BOTTLES DRAWN AEROBIC AND ANAEROBIC Blood Culture adequate volume Performed at [facility], [address] [place], [place] [address]  Culture  Setup Time   Final    GRAM POSITIVE COCCI AEROBIC BOTTLE ONLY CRITICAL VALUE NOTED.  VALUE IS CONSISTENT WITH PREVIOUSLY REPORTED AND CALLED VALUE. Performed at [facility], [address] [place], [place] [address]    Culture (A)  Final    STAPHYLOCOCCUS AUREUS SUSCEPTIBILITIES PERFORMED ON PREVIOUS CULTURE WITHIN THE LAST 5 DAYS. Performed at [facility], [place] [address] [place], [place] [address]    Report Status 11/17/2018 FINAL  Final  CULTURE, BLOOD (ROUTINE X 2) w Reflex to ID Panel     Status: Abnormal   Collection Time: 11/14/18 11:04 AM   Specimen: BLOOD  Result Value Ref Range Status   Specimen Description   Final    BLOOD L HAND Performed at [facility], [address] [place], [place] [address]     Special Requests   Final    BOTTLES DRAWN AEROBIC AND ANAEROBIC Blood Culture adequate volume Performed at [facility], [address], [place], [place] [address]    Culture  Setup Time   Final    GRAM POSITIVE COCCI IN BOTH AEROBIC AND ANAEROBIC BOTTLES CRITICAL RESULT CALLED TO, READ BACK BY AND VERIFIED WITH: CHARLES SHANLEVER AT 7902 ON 11/15/2018 [place]. Performed at [facility], [place] [address], [place],  [address]    Culture METHICILLIN RESISTANT STAPHYLOCOCCUS AUREUS (A)  Final   Report Status 11/17/2018 FINAL  Final   Organism ID, Bacteria METHICILLIN RESISTANT STAPHYLOCOCCUS AUREUS  Final      Susceptibility   Methicillin resistant staphylococcus aureus - MIC*    CIPROFLOXACIN >=8 RESISTANT Resistant     ERYTHROMYCIN >=8 RESISTANT Resistant     GENTAMICIN <=0.5 SENSITIVE Sensitive     OXACILLIN >=4 RESISTANT Resistant     TETRACYCLINE <=1 SENSITIVE Sensitive     VANCOMYCIN 1 SENSITIVE Sensitive     TRIMETH/SULFA <=10 SENSITIVE Sensitive     CLINDAMYCIN >=8 RESISTANT Resistant     RIFAMPIN <=0.5 SENSITIVE Sensitive     Inducible Clindamycin NEGATIVE Sensitive     * METHICILLIN RESISTANT STAPHYLOCOCCUS AUREUS  Blood Culture ID Panel (Reflexed)     Status: Abnormal   Collection Time: 11/14/18 11:04 AM  Result Value Ref Range Status   Enterococcus species NOT DETECTED NOT DETECTED Final   Listeria monocytogenes NOT DETECTED NOT DETECTED Final   Staphylococcus species DETECTED (A) NOT DETECTED Final    Comment: CRITICAL RESULT CALLED TO, READ BACK BY AND VERIFIED WITH: CHARLES SHANLEVER AT 5329 ON 11/15/2018 [place].    Staphylococcus aureus (BCID) DETECTED (A) NOT DETECTED Final    Comment: Methicillin (oxacillin)-resistant Staphylococcus aureus (MRSA). MRSA is predictably resistant to beta-lactam antibiotics (except ceftaroline). Preferred therapy is vancomycin unless clinically contraindicated. Patient requires contact precautions if   hospitalized. CRITICAL RESULT CALLED TO, READ BACK BY AND VERIFIED WITH: CHARLES SHANLEVER AT 9242 ON 11/15/2018 [place].    Methicillin resistance DETECTED (A) NOT DETECTED Final    Comment: CRITICAL RESULT CALLED TO, READ BACK BY AND VERIFIED WITH: CHARLES SHANLEVER AT [phone] ON 11/15/2018 [place].    Streptococcus species NOT DETECTED NOT DETECTED Final   Streptococcus agalactiae NOT DETECTED NOT DETECTED Final   Streptococcus pneumoniae NOT DETECTED NOT DETECTED Final   Streptococcus pyogenes NOT DETECTED NOT DETECTED Final   Acinetobacter baumannii NOT DETECTED NOT DETECTED Final   Enterobacteriaceae species NOT DETECTED NOT DETECTED Final   Enterobacter cloacae complex NOT DETECTED NOT DETECTED Final   Escherichia coli NOT DETECTED NOT DETECTED Final  Klebsiella oxytoca NOT DETECTED NOT DETECTED Final   Klebsiella pneumoniae NOT DETECTED NOT DETECTED Final   Proteus species NOT DETECTED NOT DETECTED Final   Serratia marcescens NOT DETECTED NOT DETECTED Final   Haemophilus influenzae NOT DETECTED NOT DETECTED Final   Neisseria meningitidis NOT DETECTED NOT DETECTED Final   Pseudomonas aeruginosa NOT DETECTED NOT DETECTED Final   Candida albicans NOT DETECTED NOT DETECTED Final   Candida glabrata NOT DETECTED NOT DETECTED Final   Candida krusei NOT DETECTED NOT DETECTED Final   Candida parapsilosis NOT DETECTED NOT DETECTED Final   Candida tropicalis NOT DETECTED NOT DETECTED Final    Comment: Performed at [facility], [address] [place], [place] [address]  CULTURE, BLOOD (ROUTINE X 2) w Reflex to ID Panel     Status: None   Collection Time: 11/16/18 12:05 AM   Specimen: BLOOD  Result Value Ref Range Status   Specimen Description BLOOD LEFT ASSIST CONTROL  Final   Special Requests   Final    BOTTLES DRAWN AEROBIC AND ANAEROBIC Blood Culture results may not be optimal due to an excessive volume of blood received in culture bottles   Culture   Final    NO GROWTH 5  DAYS Performed at [facility], [address] [place], [place] [address]    Report Status 11/21/2018 FINAL  Final  CULTURE, BLOOD (ROUTINE X 2) w Reflex to ID Panel     Status: None   Collection Time: 11/16/18 12:11 AM   Specimen: BLOOD  Result Value Ref Range Status   Specimen Description BLOOD LEFT HAND  Final   Special Requests   Final    BOTTLES DRAWN AEROBIC AND ANAEROBIC Blood Culture adequate volume   Culture   Final    NO GROWTH 5 DAYS Performed at [facility], [address] [place],  [address]    Report Status 11/21/2018 FINAL  Final    Coagulation Studies: No results for input(s): LABPROT, INR in the last 72 hours.  Urinalysis: No results for input(s): COLORURINE, LABSPEC, PHURINE, GLUCOSEU, HGBUR, BILIRUBINUR, KETONESUR, PROTEINUR, UROBILINOGEN, NITRITE, LEUKOCYTESUR in the last 72 hours.  Invalid input(s): APPERANCEUR    Imaging: [place] Ekg Site Rite  Result Date: 11/21/2018 If Site Rite image not attached, placement could not be confirmed due to current cardiac rhythm.    Medications:   .  sodium bicarbonate (isotonic) infusion in sterile water 100 mL/hr at 11/22/18 0637  . vancomycin Stopped (11/21/18 1242)   . apixaban  5 mg Oral BID  . aspirin EC  81 mg Oral Daily  . digoxin  0.25 mg Oral Daily  . docusate sodium  100 mg Oral BID  . finasteride  5 mg Oral Daily  . folic acid  1 mg Oral Daily  . gabapentin  300 mg Oral BID  . loratadine  10 mg Oral Daily  . LORazepam  0.5 mg Oral Daily  . metoprolol succinate  12.5 mg Oral Daily  . vancomycin variable dose per unstable renal function (pharmacist dosing)   Does not apply See admin instructions   acetaminophen **OR** acetaminophen, bisacodyl, hydrOXYzine, ondansetron **OR** ondansetron (ZOFRAN) IV, senna, traZODone  Assessment/ Plan:  Ruben Clark is a 63 y.o. white male with systolic congestive heart failure, hypertension, atrial fibrillation, BPH, bilateral toe  amputations, recent left fracture hip who is admitted to [facility] on 11/10/2018 for Hypercalcemia [E83.52] Generalized weakness [R53.1] Altered mental status, unspecified altered mental status type [R41.82]  1. Acute renal  failure on chronic kidney disease stage III. Baseline creatinine of 1.62, GFR of 45 on 10/21/18.  Acute renal failure secondary to obstructive uropathy. Foley catheter placed. Nonoliguric urine output.   2. Hypokalemia - recommend PO replacement  3. Hypertension: with atrial fibrillation on digoxin - metoprolol   4. Urinary tract infection with MRSA bacteremia.  - IV vancomycin via PICC.    LOS: 12 Rufino Staup 8/17/202010:01 AM

## 2018-11-22 NOTE — Treatment Plan (Signed)
Diagnosis: MRSA bacteremia Baseline Creatinine 1.4 Vanco level 20 from 11/21/18    Allergies  Allergen Reactions  . 5-Alpha Reductase Inhibitors     OPAT Orders Discharge antibiotics: Vancomycin 1 gram IVPB every 24 hours Per pharmacy protocol dose adjusted  Aim for Vancomycin trough 15-20  End Date: 12/13/18  [facility] Care Per Protocol including placement of biopatch:  Labs weekly on Monday while on IV antibiotics: _X_ CBC with differential  __X CMP  _X_ Vancomycin trough  Labs weekly on Thursday while on antibioitc XBMP X vanco torugh  _X_ Please pull PIC at completion of IV antibiotics  Fax weekly labs to Dr.Hanaa Payes at [phone]  Clinic Follow Up Appt in 2 weeks   Call [phone] to make appt

## 2018-11-22 NOTE — Evaluation (Signed)
Physical Therapy Evaluation Patient Details Name: Ruben Clark MRN: [id number] DOB: 06-Feb-1956 Today's Date: 11/22/2018   History of Present Illness  63 y.o. male with a known history of chronic systolic congestive heart failure, CKD stage III, hypertension, chronic afib on anticoagulation with eliquis was referred from group home.  Patient mid July he had hip fracture which was repaired (ORIF) and sent to rehab, apparently he returned to group home for 1 day and was sent to [facility].  On arrival Ca+ very elevated.  Pt s/p 11/17/18 cystoscopy with TURBT and R retrograde pyelography requiring new PT order.  Diagnosis with UTI, htn, hypokalemia, and acute on chronic kidney disease.  New PT order received 11/22/18.  Clinical Impression  New PT consult received so PT re-evaluation performed.  Pt demonstrating soft voice and generalized confusion during session.  Requiring max assist semi-supine to/from sit and close SBA for sitting balance edge of bed with B UE support.  Pt only able to sit for about 2 minutes d/t pt fatigue and reports of dizziness; pt reporting dizziness resolved once laying down in bed for a few minutes (nurse notified).  Overall pt demonstrating generalized weakness, appearing very deconditioned, and decreased activity tolerance.  Pt would benefit from skilled PT to focus on strengthening, balance, and progressive functional mobility per pt tolerance.  POC reviewed and updated as appropriate.    Follow Up Recommendations SNF    Equipment Recommendations  Rolling walker with 5" wheels;Wheelchair (measurements PT);Wheelchair cushion (measurements PT);Hospital bed    Recommendations for Other Services       Precautions / Restrictions Precautions Precautions: Fall Restrictions Weight Bearing Restrictions: Yes LLE Weight Bearing: Weight bearing as tolerated      Mobility  Bed Mobility Overal bed mobility: Needs Assistance Bed Mobility: Supine to Sit;Sit to Supine     Supine to  sit: Max assist;HOB elevated Sit to supine: Max assist;HOB elevated   General bed mobility comments: assist for trunk and B LE's; pt attempting to assist but appearing very weak requiring increased assist and time to perform  Transfers                 General transfer comment: Deferred d/t pt reporting dizziness and fatigue sitting edge of bed  Ambulation/Gait             General Gait Details: Deferred d/t pt reporting dizziness and fatigue sitting edge of bed  Stairs            Wheelchair Mobility    Modified Rankin (Stroke Patients Only)       Balance Overall balance assessment: Needs assistance Sitting-balance support: Bilateral upper extremity supported;Feet supported Sitting balance-Leahy Scale: Fair Sitting balance - Comments: steady static sitting with B UE support x2 minutes (although difficulty with upright posture with fatigue)                                     Pertinent Vitals/Pain Pain Assessment: Faces Faces Pain Scale: No hurt(initially complaining of pain from catheter with adjustment of pt's gown, but then no other complaints noted during session) Pain Intervention(s): Limited activity within patient's tolerance;Monitored during session;Repositioned    Home Living Family/patient expects to be discharged to:: Group home                      Prior Function Level of Independence: Independent with assistive device(s)  Comments: Pt reports he was ambulatory and able to manage his ADL's at group home prior to multiple hospital admissions.     Hand Dominance        Extremity/Trunk Assessment   Upper Extremity Assessment RUE Deficits / Details: 3+/5 shoulder flexion, at least 3/5 AROM elbow flexion/extension, and fair grip strength LUE Deficits / Details: 3+/5 shoulder flexion, at least 3/5 AROM elbow flexion/extension, and fair grip strength    Lower Extremity Assessment Lower Extremity Assessment:  RLE deficits/detail;LLE deficits/detail;Generalized weakness RLE Deficits / Details: at least 3/5 AROM hip flexion, knee flexion/extension, and DF/PF LLE Deficits / Details: at least 3/5 AROM hip flexion, knee flexion/extension, and DF/PF    Cervical / Trunk Assessment Cervical / Trunk Assessment: Kyphotic  Communication   Communication: (soft spoken)  Cognition Arousal/Alertness: Awake/alert Behavior During Therapy: Flat affect                                   General Comments: Oriented to person and place and month but not year or day.  General confusion noted during session.      General Comments General comments (skin integrity, edema, etc.): Skin tear noted L UE with dressing on (nurse notified and aware).  Nursing cleared pt for participation in physical therapy.  Pt agreeable to PT session.    Exercises     Assessment/Plan    PT Assessment Patient needs continued PT services  PT Problem List Decreased strength;Decreased mobility;Decreased safety awareness;Decreased activity tolerance;Decreased coordination;Decreased knowledge of precautions;Decreased balance;Decreased knowledge of use of DME;Pain;Decreased cognition;Decreased range of motion;Cardiopulmonary status limiting activity;Decreased skin integrity       PT Treatment Interventions DME instruction;Therapeutic activities;Gait training;Therapeutic exercise;Patient/family education;Stair training;Balance training;Functional mobility training;Neuromuscular re-education;Wheelchair mobility training    PT Goals (Current goals can be found in the Care Plan section)  Acute Rehab PT Goals Patient Stated Goal: be able to walk PT Goal Formulation: With patient Time For Goal Achievement: 12/06/18 Potential to Achieve Goals: Fair    Frequency Min 2X/week   Barriers to discharge Decreased caregiver support      Co-evaluation               AM-PAC PT "6 Clicks" Mobility  Outcome Measure Help needed  turning from your back to your side while in a flat bed without using bedrails?: A Lot Help needed moving from lying on your back to sitting on the side of a flat bed without using bedrails?: A Lot Help needed moving to and from a bed to a chair (including a wheelchair)?: Total Help needed standing up from a chair using your arms (e.g., wheelchair or bedside chair)?: Total Help needed to walk in hospital room?: Total Help needed climbing 3-5 steps with a railing? : Total 6 Click Score: 8    End of Session   Activity Tolerance: Patient limited by fatigue Patient left: in bed;with bed alarm set;with nursing/sitter in room(nurse present assisting pt with clean up) Nurse Communication: Mobility status;Precautions;Other (comment)(pt c/o dizziness sitting edge of bed) PT Visit Diagnosis: Muscle weakness (generalized) (M62.81);Difficulty in walking, not elsewhere classified (R26.2);Pain;History of falling (Z91.81);Other abnormalities of gait and mobility (R26.89)    Time: 3419-3790 PT Time Calculation (min) (ACUTE ONLY): 31 min   Charges:   PT Evaluation $PT Re-evaluation: 1 Re-eval          Leitha Bleak, PT 11/22/18, 1:29 PM [phone]

## 2018-11-22 NOTE — TOC Progression Note (Signed)
Transition of Care [facility]) - Progression Note    Patient Details  Name: Ruben Clark MRN: [id number] Date of Birth: 02/02/1956  Transition of [facility]) CM/SW Contact  Ross Ludwig, [place] Phone Number: 11/22/2018, 3:32 PM  Clinical Narrative:     CSW spoke with [place] patient's legal guardian [phone].  CSW presented bed offers, patient's legal guardian would like him to go to [facility].  Insurance authorization is still pending, patient can discharge to SNF once insurance has been authorized, and Covid results are received.  CSW to continue to follow patient's progress throughout discharge planning.     Barriers to Discharge: Other (comment)(Unsure if [facility] home can take him back. Do not know if he has medicaid for long term care.  May need higher level)  Expected Discharge Plan and Services    Plan to discharge to SNF pending insurance authorization and negative covid result.       Living arrangements for the past 2 months: [place], [place] Expected Discharge Date: 11/22/18                                     Social Determinants of Health (SDOH) Interventions    Readmission Risk Interventions Readmission Risk Prevention Plan 10/10/2018  Post Dischage Appt Complete  Medication Screening Complete  Transportation Screening Complete

## 2018-11-23 LAB — BASIC METABOLIC PANEL
Anion gap: 8 (ref 5–15)
BUN: 25 mg/dL — ABNORMAL HIGH (ref 8–23)
CO2: 34 mmol/L — ABNORMAL HIGH (ref 22–32)
Calcium: 8.5 mg/dL — ABNORMAL LOW (ref 8.9–10.3)
Chloride: 92 mmol/L — ABNORMAL LOW (ref 98–111)
Creatinine, Ser: 1.84 mg/dL — ABNORMAL HIGH (ref 0.61–1.24)
GFR calc Af Amer: 45 mL/min — ABNORMAL LOW (ref >60)
GFR calc non Af Amer: 38 mL/min — ABNORMAL LOW (ref >60)
Glucose, Bld: 80 mg/dL (ref 70–99)
Potassium: 3 mmol/L — ABNORMAL LOW (ref 3.5–5.1)
Sodium: 134 mmol/L — ABNORMAL LOW (ref 135–145)

## 2018-11-23 LAB — SARS CORONAVIRUS 2 BY RT PCR (HOSPITAL ORDER, PERFORMED IN ~~LOC~~ HOSPITAL LAB): SARS Coronavirus 2: NEGATIVE

## 2018-11-23 LAB — CBC
HCT: 24 % — ABNORMAL LOW (ref 39.0–52.0)
Hemoglobin: 7.4 g/dL — ABNORMAL LOW (ref 13.0–17.0)
MCH: 28.5 pg (ref 26.0–34.0)
MCHC: 30.8 g/dL (ref 30.0–36.0)
MCV: 92.3 fL (ref 80.0–100.0)
Platelets: 240 10*3/uL (ref 150–400)
RBC: 2.6 MIL/uL — ABNORMAL LOW (ref 4.22–5.81)
RDW: 15.4 % (ref 11.5–15.5)
WBC: 23.9 10*3/uL — ABNORMAL HIGH (ref 4.0–10.5)
nRBC: 0 % (ref 0.0–0.2)

## 2018-11-23 LAB — MAGNESIUM: Magnesium: 1.4 mg/dL — ABNORMAL LOW (ref 1.7–2.4)

## 2018-11-23 LAB — VANCOMYCIN, RANDOM: Vancomycin Rm: 25

## 2018-11-23 LAB — ABO/RH: ABO/RH(D): A POS

## 2018-11-23 MED ORDER — MAGNESIUM SULFATE 2 GM/50ML IV SOLN
2.0000 g | Freq: Once | INTRAVENOUS | Status: AC
  Administered 2018-11-23: 2 g via INTRAVENOUS
  Filled 2018-11-23: qty 50

## 2018-11-23 MED ORDER — VANCOMYCIN IV (FOR PTA / DISCHARGE USE ONLY): 1250.0000 mg | INTRAVENOUS | 0 refills | Status: DC

## 2018-11-23 MED ORDER — POTASSIUM CHLORIDE CRYS ER 20 MEQ PO TBCR
40.0000 meq | EXTENDED_RELEASE_TABLET | Freq: Once | ORAL | Status: AC
  Administered 2018-11-23: 40 meq via ORAL
  Filled 2018-11-23: qty 2

## 2018-11-23 MED ORDER — VANCOMYCIN HCL 10 G IV SOLR
1250.0000 mg | INTRAVENOUS | Status: DC
  Filled 2018-11-23: qty 1250

## 2018-11-23 NOTE — Treatment Plan (Addendum)
MRSA bacteremia Baseline Creatinine 1.4 Vanco level 25 from 11/23/18        Allergies  Allergen Reactions  . 5-Alpha Reductase Inhibitors     OPAT Orders Discharge antibiotics: Vancomycin 1250 mg IVPB every 48 hrs  Next dose due is 11/24/18 at 10 am Per pharmacy protocol dose adjusted . Consult your pharmacist to dose this patient Aim for Vancomycin trough 15-20  End Date: 12/13/18  [facility] Care Per Protocol including placement of biopatch:  Labs weekly on Monday while on IV antibiotics: _X_ CBC with differential  __X CMP  _X_ Vancomycin trough  Labs weekly on Thursday while on antibioitc XBMP X vanco torugh  _X_ Please pull PIC at completion of IV antibiotics  Fax weekly labs to Dr.Angeles Zehner at [phone]  Clinic Follow Up Appt in 2 weeks   Call [phone] to make appt

## 2018-11-23 NOTE — Progress Notes (Signed)
[facility]  ROUNDING NOTE   Subjective:   UOP 2612mL.  Creatinine 1.84 (1.41)  Bicarb gtt at 181mL/hr   Objective:  Vital signs in last 24 hours:  Temp:  [98.2 F (36.8 C)-99.2 F (37.3 C)] 98.2 F (36.8 C) (08/18 0809) Pulse Rate:  [55-88] 80 (08/18 0809) Resp:  [18-23] 18 (08/18 0637) BP: (85-120)/(54-107) 85/57 (08/18 0809) SpO2:  [96 %-100 %] 100 % (08/18 0809) Weight:  [72.5 kg] 72.5 kg (08/18 0637)  Weight change: 3.236 kg Filed Weights   11/21/18 0405 11/22/18 0500 11/23/18 0637  Weight: 71.2 kg 69.3 kg 72.5 kg    Intake/Output: I/O last 3 completed shifts: In: 4120.4 [I.V.:3679.1; IV Piggyback:441.4] Out: 3200 [Urine:3200]   Intake/Output this shift:  Total I/O In: 10 [I.V.:10] Out: -   Physical Exam: General: NAD, laying in bed  Head: Normocephalic, atraumatic. Moist oral mucosal membranes  Eyes: Anicteric, PERRL  Neck: Supple, trachea midline  Lungs:  Clear to auscultation  Heart: irregular  Abdomen:  Soft, nontender  Extremities:  no edema, bilateral toe amputations  Neurologic: Alert, difficult to understand  Skin: No lesions  GU: foley    Basic Metabolic Panel: Recent Labs  Lab 11/19/18 0757 11/20/18 0527 11/21/18 0455 11/22/18 0445 11/23/18 0436  NA 137 140 139 137 134*  K 2.9* 3.3* 3.2* 3.4* 3.0*  CL 97* 98 100 101 92*  CO2 30 35* 32 31 34*  GLUCOSE 152* 109* 83 82 80  BUN 51* 41* 27* 21 25*  CREATININE 2.57* 1.96* 1.39* 1.41* 1.84*  CALCIUM 9.9 9.3 8.9 8.5* 8.5*  MG  --   --   --   --  1.4*    Liver Function Tests: No results for input(s): AST, ALT, ALKPHOS, BILITOT, PROT, ALBUMIN in the last 168 hours. No results for input(s): LIPASE, AMYLASE in the last 168 hours. No results for input(s): AMMONIA in the last 168 hours.  CBC: Recent Labs  Lab 11/17/18 0639 11/18/18 0532 11/19/18 0757 11/20/18 0527 11/23/18 0436  WBC 23.1* 14.1* 13.0* 12.5* 23.9*  HGB 8.3* 8.1* 8.5* 8.2* 7.4*  HCT 25.5* 25.2* 26.7*  25.8* 24.0*  MCV 88.2 88.4 88.7 89.0 92.3  PLT 329 276 289 293 240    Cardiac Enzymes: No results for input(s): CKTOTAL, CKMB, CKMBINDEX, TROPONINI in the last 168 hours.  BNP: Invalid input(s): POCBNP  CBG: No results for input(s): GLUCAP in the last 168 hours.  Microbiology: Results for orders placed or performed during the hospital encounter of 11/10/18  SARS Coronavirus 2 [facility] order, Performed in [facility] hospital lab) Nasopharyngeal Nasopharyngeal Swab     Status: None   Collection Time: 11/10/18  6:40 PM   Specimen: Nasopharyngeal Swab  Result Value Ref Range Status   SARS Coronavirus 2 NEGATIVE NEGATIVE Final    Comment: (NOTE) If result is NEGATIVE SARS-CoV-2 target nucleic acids are NOT DETECTED. The SARS-CoV-2 RNA is generally detectable in upper and lower  respiratory specimens during the acute phase of infection. The lowest  concentration of SARS-CoV-2 viral copies this assay can detect is 250  copies / mL. A negative result does not preclude SARS-CoV-2 infection  and should not be used as the sole basis for treatment or other  patient management decisions.  A negative result may occur with  improper specimen collection / handling, submission of specimen other  than nasopharyngeal swab, presence of viral mutation(s) within the  areas targeted by this assay, and inadequate number of viral copies  (<250 copies /  mL). A negative result must be combined with clinical  observations, patient history, and epidemiological information. If result is POSITIVE SARS-CoV-2 target nucleic acids are DETECTED. The SARS-CoV-2 RNA is generally detectable in upper and lower  respiratory specimens dur ing the acute phase of infection.  Positive  results are indicative of active infection with SARS-CoV-2.  Clinical  correlation with patient history and other diagnostic information is  necessary to determine patient infection status.  Positive results do  not rule out  bacterial infection or co-infection with other viruses. If result is PRESUMPTIVE POSTIVE SARS-CoV-2 nucleic acids MAY BE PRESENT.   A presumptive positive result was obtained on the submitted specimen  and confirmed on repeat testing.  While 2019 novel coronavirus  (SARS-CoV-2) nucleic acids may be present in the submitted sample  additional confirmatory testing may be necessary for epidemiological  and / or clinical management purposes  to differentiate between  SARS-CoV-2 and other Sarbecovirus currently known to infect humans.  If clinically indicated additional testing with an alternate test  methodology [phone]) is advised. The SARS-CoV-2 RNA is generally  detectable in upper and lower respiratory sp ecimens during the acute  phase of infection. The expected result is Negative. Fact Sheet for Patients:  StrictlyIdeas.no Fact Sheet for Healthcare Providers: BankingDealers.co.za This test is not yet approved or cleared by the [place] FDA and has been authorized for detection and/or diagnosis of SARS-CoV-2 by FDA under an Emergency Use Authorization (EUA).  This EUA will remain in effect (meaning this test can be used) for the duration of the COVID-19 declaration under Section 564(b)(1) of the Act, 21 U.S.C. section 360bbb-3(b)(1), unless the authorization is terminated or revoked sooner. Performed at [facility], [address] [place], [place] [address]   Urine Culture     Status: Abnormal   Collection Time: 11/13/18 11:21 PM   Specimen: Urine, Random  Result Value Ref Range Status   Specimen Description   Final    URINE, RANDOM Performed at [facility], [address] [place], [place] [address]    Special Requests   Final    NONE Performed at [facility], [address] [place], [place] [address]    Culture (A)  Final    >=100,000 COLONIES/mL METHICILLIN RESISTANT STAPHYLOCOCCUS  AUREUS   Report Status 11/17/2018 FINAL  Final   Organism ID, Bacteria METHICILLIN RESISTANT STAPHYLOCOCCUS AUREUS (A)  Final      Susceptibility   Methicillin resistant staphylococcus aureus - MIC*    CIPROFLOXACIN >=8 RESISTANT Resistant     GENTAMICIN <=0.5 SENSITIVE Sensitive     NITROFURANTOIN <=16 SENSITIVE Sensitive     OXACILLIN >=4 RESISTANT Resistant     TETRACYCLINE <=1 SENSITIVE Sensitive     VANCOMYCIN 1 SENSITIVE Sensitive     TRIMETH/SULFA <=10 SENSITIVE Sensitive     CLINDAMYCIN >=8 RESISTANT Resistant     RIFAMPIN <=0.5 SENSITIVE Sensitive     Inducible Clindamycin NEGATIVE Sensitive     * >=100,000 COLONIES/mL METHICILLIN RESISTANT STAPHYLOCOCCUS AUREUS  CULTURE, BLOOD (ROUTINE X 2) w Reflex to ID Panel     Status: Abnormal   Collection Time: 11/14/18 10:53 AM   Specimen: BLOOD  Result Value Ref Range Status   Specimen Description   Final    BLOOD  L FOREARM Performed at [facility], [address], [place], [place] [address]    Special Requests   Final    BOTTLES DRAWN AEROBIC AND ANAEROBIC Blood Culture adequate volume Performed at  [facility], [address] [place], [place] [address]    Culture  Setup Time   Final    GRAM POSITIVE COCCI AEROBIC BOTTLE ONLY CRITICAL VALUE NOTED.  VALUE IS CONSISTENT WITH PREVIOUSLY REPORTED AND CALLED VALUE. Performed at [facility], [address] [place], [place] [address]    Culture (A)  Final    STAPHYLOCOCCUS AUREUS SUSCEPTIBILITIES PERFORMED ON PREVIOUS CULTURE WITHIN THE LAST 5 DAYS. Performed at [facility], [place] [address], [place], [place] [address]    Report Status 11/17/2018 FINAL  Final  CULTURE, BLOOD (ROUTINE X 2) w Reflex to ID Panel     Status: Abnormal   Collection Time: 11/14/18 11:04 AM   Specimen: BLOOD  Result Value Ref Range Status   Specimen Description   Final    BLOOD L HAND Performed at [facility], [address], [place], [place]  [address]    Special Requests   Final    BOTTLES DRAWN AEROBIC AND ANAEROBIC Blood Culture adequate volume Performed at [facility], [address] [place], [place] [address]    Culture  Setup Time   Final    GRAM POSITIVE COCCI IN BOTH AEROBIC AND ANAEROBIC BOTTLES CRITICAL RESULT CALLED TO, READ BACK BY AND VERIFIED WITH: CHARLES SHANLEVER AT 5885 ON 11/15/2018 [place]. Performed at [facility], [place] [address], [place], [place] [address]    Culture METHICILLIN RESISTANT STAPHYLOCOCCUS AUREUS (A)  Final   Report Status 11/17/2018 FINAL  Final   Organism ID, Bacteria METHICILLIN RESISTANT STAPHYLOCOCCUS AUREUS  Final      Susceptibility   Methicillin resistant staphylococcus aureus - MIC*    CIPROFLOXACIN >=8 RESISTANT Resistant     ERYTHROMYCIN >=8 RESISTANT Resistant     GENTAMICIN <=0.5 SENSITIVE Sensitive     OXACILLIN >=4 RESISTANT Resistant     TETRACYCLINE <=1 SENSITIVE Sensitive     VANCOMYCIN 1 SENSITIVE Sensitive     TRIMETH/SULFA <=10 SENSITIVE Sensitive     CLINDAMYCIN >=8 RESISTANT Resistant     RIFAMPIN <=0.5 SENSITIVE Sensitive     Inducible Clindamycin NEGATIVE Sensitive     * METHICILLIN RESISTANT STAPHYLOCOCCUS AUREUS  Blood Culture ID Panel (Reflexed)     Status: Abnormal   Collection Time: 11/14/18 11:04 AM  Result Value Ref Range Status   Enterococcus species NOT DETECTED NOT DETECTED Final   Listeria monocytogenes NOT DETECTED NOT DETECTED Final   Staphylococcus species DETECTED (A) NOT DETECTED Final    Comment: CRITICAL RESULT CALLED TO, READ BACK BY AND VERIFIED WITH: CHARLES SHANLEVER AT 1287 ON 11/15/2018 [place].    Staphylococcus aureus (BCID) DETECTED (A) NOT DETECTED Final    Comment: Methicillin (oxacillin)-resistant Staphylococcus aureus (MRSA). MRSA is predictably resistant to beta-lactam antibiotics (except ceftaroline). Preferred therapy is vancomycin unless clinically contraindicated. Patient requires contact precautions if   hospitalized. CRITICAL RESULT CALLED TO, READ BACK BY AND VERIFIED WITH: CHARLES SHANLEVER AT 8676 ON 11/15/2018 [place].    Methicillin resistance DETECTED (A) NOT DETECTED Final    Comment: CRITICAL RESULT CALLED TO, READ BACK BY AND VERIFIED WITH: CHARLES SHANLEVER AT [phone] ON 11/15/2018 [place].    Streptococcus species NOT DETECTED NOT DETECTED Final   Streptococcus agalactiae NOT DETECTED NOT DETECTED Final   Streptococcus pneumoniae NOT DETECTED NOT DETECTED Final   Streptococcus pyogenes NOT DETECTED NOT DETECTED Final   Acinetobacter baumannii NOT DETECTED NOT DETECTED Final   Enterobacteriaceae species NOT DETECTED NOT DETECTED Final   Enterobacter cloacae complex NOT  DETECTED NOT DETECTED Final   Escherichia coli NOT DETECTED NOT DETECTED Final   Klebsiella oxytoca NOT DETECTED NOT DETECTED Final   Klebsiella pneumoniae NOT DETECTED NOT DETECTED Final   Proteus species NOT DETECTED NOT DETECTED Final   Serratia marcescens NOT DETECTED NOT DETECTED Final   Haemophilus influenzae NOT DETECTED NOT DETECTED Final   Neisseria meningitidis NOT DETECTED NOT DETECTED Final   Pseudomonas aeruginosa NOT DETECTED NOT DETECTED Final   Candida albicans NOT DETECTED NOT DETECTED Final   Candida glabrata NOT DETECTED NOT DETECTED Final   Candida krusei NOT DETECTED NOT DETECTED Final   Candida parapsilosis NOT DETECTED NOT DETECTED Final   Candida tropicalis NOT DETECTED NOT DETECTED Final    Comment: Performed at [facility], [address] [place], [place] [address]  CULTURE, BLOOD (ROUTINE X 2) w Reflex to ID Panel     Status: None   Collection Time: 11/16/18 12:05 AM   Specimen: BLOOD  Result Value Ref Range Status   Specimen Description BLOOD LEFT ASSIST CONTROL  Final   Special Requests   Final    BOTTLES DRAWN AEROBIC AND ANAEROBIC Blood Culture results may not be optimal due to an excessive volume of blood received in culture bottles   Culture   Final    NO GROWTH 5  DAYS Performed at [facility], [address] [place], [place] [address]    Report Status 11/21/2018 FINAL  Final  CULTURE, BLOOD (ROUTINE X 2) w Reflex to ID Panel     Status: None   Collection Time: 11/16/18 12:11 AM   Specimen: BLOOD  Result Value Ref Range Status   Specimen Description BLOOD LEFT HAND  Final   Special Requests   Final    BOTTLES DRAWN AEROBIC AND ANAEROBIC Blood Culture adequate volume   Culture   Final    NO GROWTH 5 DAYS Performed at [facility], [address] [place], [place] [address]    Report Status 11/21/2018 FINAL  Final    Coagulation Studies: No results for input(s): LABPROT, INR in the last 72 hours.  Urinalysis: No results for input(s): COLORURINE, LABSPEC, PHURINE, GLUCOSEU, HGBUR, BILIRUBINUR, KETONESUR, PROTEINUR, UROBILINOGEN, NITRITE, LEUKOCYTESUR in the last 72 hours.  Invalid input(s): APPERANCEUR    Imaging: Dg Chest Port 1 View  Result Date: 11/22/2018 CLINICAL DATA:  Status post PICC placement. EXAM: PORTABLE CHEST 1 VIEW COMPARISON:  11/10/2018. FINDINGS: Right PICC tip poorly visualized, most likely in the region of the superior cavoatrial junction. Stable borderline enlarged cardiac silhouette and clear lungs with normal vascularity. Unremarkable bones. IMPRESSION: 1. Right PICC tip poorly visualized, most likely in the region of the superior cavoatrial junction. 2. Stable borderline cardiomegaly. Electronically Signed   By: Claudie Revering M.D.   On: 11/22/2018 14:45     Medications:   .  sodium bicarbonate (isotonic) infusion in sterile water 100 mL/hr at 11/23/18 0207  . vancomycin Stopped (11/22/18 1618)   . apixaban  5 mg Oral BID  . aspirin EC  81 mg Oral Daily  . digoxin  0.25 mg Oral Daily  . docusate sodium  100 mg Oral BID  . finasteride  5 mg Oral Daily  . folic acid  1 mg Oral Daily  . gabapentin  300 mg Oral BID  . loratadine  10 mg Oral Daily  . LORazepam  0.5 mg Oral Daily  . metoprolol  succinate  12.5 mg Oral Daily  . polyethylene glycol  17 g Oral  Once  . potassium chloride  20 mEq Oral Once  . sodium chloride flush  10-40 mL Intracatheter Q12H  . vancomycin variable dose per unstable renal function (pharmacist dosing)   Does not apply See admin instructions   acetaminophen **OR** acetaminophen, bisacodyl, hydrOXYzine, ondansetron **OR** ondansetron (ZOFRAN) IV, senna, sodium chloride flush, traZODone  Assessment/ Plan:  [name] is a 63 y.o. white male with systolic congestive heart failure, hypertension, atrial fibrillation, BPH, bilateral toe amputations, recent left fracture hip who is admitted to [facility] on 11/10/2018 for Hypercalcemia [E83.52] Generalized weakness [R53.1] Altered mental status, unspecified altered mental status type [R41.82]  1. Acute renal failure on chronic kidney disease stage III. Baseline creatinine of 1.62, GFR of 45 on 10/21/18.  Acute renal failure secondary to obstructive uropathy. Foley catheter placed. Nonoliguric urine output.   2. Hypokalemia - PO replacement  3. Hypertension: with atrial fibrillation on digoxin - metoprolol   4. Urinary tract infection with MRSA bacteremia.  - IV vancomycin via PICC.   Follow up with Nephrology, Dr. Holley Raring on 8/27 at 2:40pm   LOS: [place] 8/18/202012:04 PM

## 2018-11-23 NOTE — Care Management Important Message (Signed)
Important Message  Patient Details  Name: Primo Innis MRN: [id number] Date of Birth: 04/04/1956   Medicare Important Message Given:  Yes     Ross Ludwig, LCSW 11/23/2018, 6:09 PM

## 2018-11-23 NOTE — TOC Transition Note (Signed)
Transition of Care [facility]) - CM/SW Discharge Note   Patient Details  Name: Ruben Clark MRN: [id number] Date of Birth: [birth year]  Transition of Care [facility]) CM/SW Contact:  Ross Ludwig, LCSW Phone Number: 11/23/2018, 11:40 AM   Clinical Narrative:     CSW spoke with legal guardian yesterday, and he chose [facility].  CSW started insurance authorization yesterday.  CSW has received approval for patient to go to SNF today.  Authorization number is [id number], he will be going to room 806 at [facility].  Patient to be d/c'ed today to [facility] room 806.  Patient and family agreeable to plans will transport via ems RN to call report to [phone].  CSW attempted to contact legal guardian to leave a message on voice mail, however his voice mail was full.  CSW spoke to [facility] and they can accept patient today pending Covid test.   Final next level of care: [place] Barriers to Discharge: No Barriers Identified   Patient Goals and CMS Choice Patient states their goals for this hospitalization and ongoing recovery are:: To go to SNF for short term rehab, and IV antibiotics then return back to [facility] family care home. CMS Medicare.gov Compare Post Acute Care list provided to:: Legal Guardian(Chavis Gash Legal Guardian [phone]) Choice offered to / list presented to : [facility] / Guardian  Discharge Placement   Existing PASRR number confirmed : 11/18/18          Patient chooses bed at: [facility] [place] Patient to be transferred to facility by: [facility] Name of family member notified: [name] Legal Guardian [phone] Patient and family notified of of transfer: 11/23/18  Discharge Plan and Services In-house Referral: Clinical Social Work, Hospice / [place] Acute Care Choice: [place]          DME Arranged: N/A DME Agency: NA                  Social  Determinants of Health ([place]) Interventions     Readmission Risk Interventions Readmission Risk Prevention Plan 10/10/2018  Post Dischage Appt Complete  Medication Screening Complete  Transportation Screening Complete

## 2018-11-23 NOTE — Progress Notes (Signed)
Patient discharged to [facility]. Report given earlier to [place] at [facility].  EMS picked up patient and belongings.  Tele d/c'd prior to transfer.

## 2018-11-23 NOTE — Progress Notes (Signed)
[facility] [place] NAME: Ruben Clark    MR#:  [id number]  DATE OF BIRTH:  31-Mar-1956  SUBJECTIVE:   picc today consent obtained  REVIEW OF SYSTEMS:    Review of Systems  Constitutional: Negative for fever, chills weight loss HENT: Negative for ear pain, nosebleeds, congestion, facial swelling, rhinorrhea, neck pain, neck stiffness and ear discharge.   Respiratory: Negative for cough, shortness of breath, wheezing  Cardiovascular: Negative for chest pain, palpitations and leg swelling.  Gastrointestinal: Negative for heartburn, abdominal pain, vomiting, diarrhea or consitpation Genitourinary: Negative for dysuria, urgency, frequency, hematuria Musculoskeletal: Negative for back pain or joint pain Neurological: Negative for dizziness, seizures, syncope, focal weakness,  numbness and headaches.  Hematological: Does not bruise/bleed easily.  Psychiatric/Behavioral: Negative for hallucinations, confusion, dysphoric mood    Tolerating Diet: yes      DRUG ALLERGIES:   Allergies  Allergen Reactions  . 5-Alpha Reductase Inhibitors     VITALS:  Blood pressure (!) 85/57, pulse 80, temperature 98.2 F (36.8 C), temperature source Oral, resp. rate 18, height 5\' 10"  (1.778 m), weight 72.5 kg, SpO2 100 %.  PHYSICAL EXAMINATION:  Constitutional: Appears frail. No distress. HENT: Normocephalic. Marland Kitchen Oropharynx is clear and moist.  Eyes: Conjunctivae and EOM are normal. PERRLA, no scleral icterus.  Neck: Normal ROM. Neck supple. No JVD. No tracheal deviation. CVS: RRR, S1/S2 +, no murmurs, no gallops, no carotid bruit.  Pulmonary: Effort and breath sounds normal, no stridor, rhonchi, wheezes, rales.  Abdominal: Soft. BS +,  no distension, tenderness, rebound or guarding.  Musculoskeletal: Normal range of motion. No edema and no tenderness.  Neuro: Alert. CN 2-12 grossly intact. No focal deficits. Skin: Skin is warm and dry. No rash  noted. Psychiatric: Normal mood and affect.      LABORATORY PANEL:   CBC Recent Labs  Lab 11/23/18 0436  WBC 23.9*  HGB 7.4*  HCT 24.0*  PLT 240   ------------------------------------------------------------------------------------------------------------------  Chemistries  Recent Labs  Lab 11/23/18 0436  NA 134*  K 3.0*  CL 92*  CO2 34*  GLUCOSE 80  BUN 25*  CREATININE 1.84*  CALCIUM 8.5*  MG 1.4*   ------------------------------------------------------------------------------------------------------------------  Cardiac Enzymes No results for input(s): TROPONINI in the last 168 hours. ------------------------------------------------------------------------------------------------------------------  RADIOLOGY:  Dg Chest Port 1 View  Result Date: 11/22/2018 CLINICAL DATA:  Status post PICC placement. EXAM: PORTABLE CHEST 1 VIEW COMPARISON:  11/10/2018. FINDINGS: Right PICC tip poorly visualized, most likely in the region of the superior cavoatrial junction. Stable borderline enlarged cardiac silhouette and clear lungs with normal vascularity. Unremarkable bones. IMPRESSION: 1. Right PICC tip poorly visualized, most likely in the region of the superior cavoatrial junction. 2. Stable borderline cardiomegaly. Electronically Signed   By: Claudie Revering M.D.   On: 11/22/2018 14:45     ASSESSMENT AND PLAN:   63 year old male with chronic systolic heart failure, PAF and squamous cell carcinoma of the bladder who presented to the emergency room due to generalized weakness.   1. Acute kidney and chronic kidney disease stage III due to bilateral hydronephrosis from obstruction: Creatinine has improved. Patient will need Foley catheter at discharge. Ureters could not be stented and plan was possible bilateral nephrostomy. However it appears that there is not an urgent need for nephrostomy given at renal function is improving. He will need outpatient urology  follow-up. 2. MRSA bacteremia: Source is from urinary tract from the bladder tumor. Repeat blood cultures are negative. 2D echocardiogram  did not show vegetation. Patient does not want TEE. Patient is currently on vancomycin. Patient with PICC line and treatment for 4 to 6 weeks. PICC/Vancomycin protocol and outpatient follow up with Dr Delaine Lame..  3. PAF: Continue digoxin and metoprolol  4. Hyperglycemia due to acute kidney injury which is resolved  5. Metabolic encephalopathy: Patient is at baseline  6. 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 Patient underwent TURBT on August 12. He will follow-up with Dr. Mike Gip   Management plans discussed with the patient and he is in agreement.  CODE STATUS: full Does not want hospice  TOTAL TIME TAKING CARE OF THIS PATIENT: 30 minutes.     POSSIBLE D/C today SNF, DEPENDING ON CLINICAL CONDITION.   Bettey Costa M.D on 11/21/2018 at 8:34 AM  Between 7am to 6pm - Pager - [phone] After 6pm go to [email or web address] - password [email or web address] [facility]  Office  [phone]  CC: Primary care physician; System, Pcp Not In  Note: This dictation was prepared with Dragon dictation along with smaller phrase technology. Any transcriptional errors that result from this process are unintentional.

## 2018-11-25 ENCOUNTER — Other Ambulatory Visit: Payer: Medicare HMO

## 2018-11-25 ENCOUNTER — Telehealth: Payer: Self-pay | Admitting: Urology

## 2018-11-25 NOTE — Telephone Encounter (Signed)
Audie Box, nurse director at [employer] called to make pt appt w/[facility] and Sharyn Lull said they would need to be contacted by surgery scheduler.  Her cell# is [phone]

## 2018-11-25 NOTE — Progress Notes (Addendum)
Tumor Board Documentation  Ruben Clark was presented by Dr Mike Gip at our Tumor Board on 11/25/2018, which included representatives from medical oncology, pathology, radiology, surgical, navigation, internal medicine, research, palliative care, pulmonology.  Ruben Clark currently presents as a new patient, for new positive pathology with history of the following treatments: surgical intervention(s).  Additionally, we reviewed previous medical and familial history, history of present illness, and recent lab results along with all available histopathologic and imaging studies. The tumor board considered available treatment options and made the following recommendations: Patient has declined chemotherapy. Patient interested in immunotherapy alone. Stent placement for kidneys, Await [facility] report  The following procedures/referrals were also placed: No orders of the defined types were placed in this encounter.   Clinical Trial Status: not discussed   Staging used: AJCC Stage Group, To be determined  AJCC Staging:       Group: Squamous Cell Carcinoma of Bladder   National site-specific guidelines NCCN were discussed with respect to the case.  Tumor board is a meeting of clinicians from various specialty areas who evaluate and discuss patients for whom a multidisciplinary approach is being considered. Final determinations in the plan of care are those of the provider(s). The responsibility for follow up of recommendations given during tumor board is that of the provider.   Today's extended care, comprehensive team conference, Ruben Clark was not present for the discussion and was not examined.   Multidisciplinary Tumor Board is a multidisciplinary case peer review process.  Decisions discussed in the Multidisciplinary Tumor Board reflect the opinions of the specialists present at the conference without having examined the patient.  Ultimately, treatment and diagnostic decisions rest with the  primary provider(s) and the patient.

## 2018-11-30 NOTE — Telephone Encounter (Signed)
Chrissy @ [facility] called back again for pt. I gave message to [facility]

## 2018-12-01 ENCOUNTER — Emergency Department: Payer: Medicare HMO

## 2018-12-01 ENCOUNTER — Telehealth: Payer: Self-pay | Admitting: *Deleted

## 2018-12-01 ENCOUNTER — Inpatient Hospital Stay: Payer: Medicare HMO | Admitting: Internal Medicine

## 2018-12-01 ENCOUNTER — Encounter: Payer: Self-pay | Admitting: Emergency Medicine

## 2018-12-01 ENCOUNTER — Inpatient Hospital Stay
Admission: EM | Admit: 2018-12-01 | Discharge: 2018-12-09 | DRG: 689 | Disposition: A | Discharge status: Hospice | Payer: Medicare HMO | Attending: Internal Medicine | Admitting: Internal Medicine

## 2018-12-01 DIAGNOSIS — Z515 Encounter for palliative care: Secondary | ICD-10-CM | POA: Diagnosis not present

## 2018-12-01 DIAGNOSIS — Z7189 Other specified counseling: Secondary | ICD-10-CM

## 2018-12-01 DIAGNOSIS — Z79899 Other long term (current) drug therapy: Secondary | ICD-10-CM

## 2018-12-01 DIAGNOSIS — E871 Hypo-osmolality and hyponatremia: Secondary | ICD-10-CM | POA: Diagnosis present

## 2018-12-01 DIAGNOSIS — Z7982 Long term (current) use of aspirin: Secondary | ICD-10-CM | POA: Diagnosis not present

## 2018-12-01 DIAGNOSIS — N179 Acute kidney failure, unspecified: Secondary | ICD-10-CM | POA: Diagnosis present

## 2018-12-01 DIAGNOSIS — N1330 Unspecified hydronephrosis: Secondary | ICD-10-CM | POA: Diagnosis not present

## 2018-12-01 DIAGNOSIS — I130 Hypertensive heart and chronic kidney disease with heart failure and stage 1 through stage 4 chronic kidney disease, or unspecified chronic kidney disease: Secondary | ICD-10-CM | POA: Diagnosis present

## 2018-12-01 DIAGNOSIS — Z888 Allergy status to other drugs, medicaments and biological substances status: Secondary | ICD-10-CM | POA: Diagnosis not present

## 2018-12-01 DIAGNOSIS — Z7901 Long term (current) use of anticoagulants: Secondary | ICD-10-CM

## 2018-12-01 DIAGNOSIS — N136 Pyonephrosis: Principal | ICD-10-CM | POA: Diagnosis present

## 2018-12-01 DIAGNOSIS — Z66 Do not resuscitate: Secondary | ICD-10-CM | POA: Diagnosis present

## 2018-12-01 DIAGNOSIS — N183 Chronic kidney disease, stage 3 (moderate): Secondary | ICD-10-CM | POA: Diagnosis present

## 2018-12-01 DIAGNOSIS — K56600 Partial intestinal obstruction, unspecified as to cause: Secondary | ICD-10-CM | POA: Diagnosis present

## 2018-12-01 DIAGNOSIS — B999 Unspecified infectious disease: Secondary | ICD-10-CM

## 2018-12-01 DIAGNOSIS — C679 Malignant neoplasm of bladder, unspecified: Secondary | ICD-10-CM | POA: Diagnosis not present

## 2018-12-01 DIAGNOSIS — T83021A Displacement of indwelling urethral catheter, initial encounter: Secondary | ICD-10-CM | POA: Diagnosis present

## 2018-12-01 DIAGNOSIS — G9341 Metabolic encephalopathy: Secondary | ICD-10-CM | POA: Diagnosis present

## 2018-12-01 DIAGNOSIS — F17200 Nicotine dependence, unspecified, uncomplicated: Secondary | ICD-10-CM | POA: Diagnosis present

## 2018-12-01 DIAGNOSIS — E876 Hypokalemia: Secondary | ICD-10-CM | POA: Diagnosis present

## 2018-12-01 DIAGNOSIS — D649 Anemia, unspecified: Secondary | ICD-10-CM | POA: Diagnosis present

## 2018-12-01 DIAGNOSIS — Z8614 Personal history of Methicillin resistant Staphylococcus aureus infection: Secondary | ICD-10-CM | POA: Diagnosis not present

## 2018-12-01 DIAGNOSIS — L89152 Pressure ulcer of sacral region, stage 2: Secondary | ICD-10-CM | POA: Diagnosis present

## 2018-12-01 DIAGNOSIS — Z20828 Contact with and (suspected) exposure to other viral communicable diseases: Secondary | ICD-10-CM | POA: Diagnosis present

## 2018-12-01 DIAGNOSIS — K56609 Unspecified intestinal obstruction, unspecified as to partial versus complete obstruction: Secondary | ICD-10-CM

## 2018-12-01 DIAGNOSIS — N401 Enlarged prostate with lower urinary tract symptoms: Secondary | ICD-10-CM | POA: Diagnosis present

## 2018-12-01 DIAGNOSIS — I482 Chronic atrial fibrillation, unspecified: Secondary | ICD-10-CM | POA: Diagnosis present

## 2018-12-01 DIAGNOSIS — N138 Other obstructive and reflux uropathy: Secondary | ICD-10-CM | POA: Diagnosis present

## 2018-12-01 DIAGNOSIS — I5032 Chronic diastolic (congestive) heart failure: Secondary | ICD-10-CM | POA: Diagnosis present

## 2018-12-01 DIAGNOSIS — D631 Anemia in chronic kidney disease: Secondary | ICD-10-CM | POA: Diagnosis present

## 2018-12-01 DIAGNOSIS — L8990 Pressure ulcer of unspecified site, unspecified stage: Secondary | ICD-10-CM | POA: Insufficient documentation

## 2018-12-01 DIAGNOSIS — Y738 Miscellaneous gastroenterology and urology devices associated with adverse incidents, not elsewhere classified: Secondary | ICD-10-CM | POA: Diagnosis present

## 2018-12-01 HISTORY — DX: Disorder of kidney and ureter, unspecified: N28.9

## 2018-12-01 LAB — URINALYSIS, COMPLETE (UACMP) WITH MICROSCOPIC
Bacteria, UA: NONE SEEN
Bilirubin Urine: NEGATIVE
Glucose, UA: NEGATIVE mg/dL
Ketones, ur: NEGATIVE mg/dL
Nitrite: NEGATIVE
Protein, ur: 100 mg/dL — AB
RBC / HPF: >50 RBC/hpf — ABNORMAL HIGH (ref 0–5)
Specific Gravity, Urine: 1.012 (ref 1.005–1.030)
Squamous Epithelial / LPF: >50 — ABNORMAL HIGH (ref 0–5)
WBC, UA: >50 WBC/hpf — ABNORMAL HIGH (ref 0–5)
pH: 6 (ref 5.0–8.0)

## 2018-12-01 LAB — CBC WITH DIFFERENTIAL/PLATELET
Abs Immature Granulocytes: 0.05 10*3/uL (ref 0.00–0.07)
Basophils Absolute: 0.1 10*3/uL (ref 0.0–0.1)
Basophils Relative: 1 %
Eosinophils Absolute: 0.3 10*3/uL (ref 0.0–0.5)
Eosinophils Relative: 3 %
HCT: 23.8 % — ABNORMAL LOW (ref 39.0–52.0)
Hemoglobin: 7.4 g/dL — ABNORMAL LOW (ref 13.0–17.0)
Immature Granulocytes: 1 %
Lymphocytes Relative: 14 %
Lymphs Abs: 1.4 10*3/uL (ref 0.7–4.0)
MCH: 28 pg (ref 26.0–34.0)
MCHC: 31.1 g/dL (ref 30.0–36.0)
MCV: 90.2 fL (ref 80.0–100.0)
Monocytes Absolute: 1 10*3/uL (ref 0.1–1.0)
Monocytes Relative: 10 %
Neutro Abs: 7.2 10*3/uL (ref 1.7–7.7)
Neutrophils Relative %: 71 %
Platelets: 502 10*3/uL — ABNORMAL HIGH (ref 150–400)
RBC: 2.64 MIL/uL — ABNORMAL LOW (ref 4.22–5.81)
RDW: 16.5 % — ABNORMAL HIGH (ref 11.5–15.5)
WBC: 10 10*3/uL (ref 4.0–10.5)
nRBC: 0 % (ref 0.0–0.2)

## 2018-12-01 LAB — TROPONIN I (HIGH SENSITIVITY)
Troponin I (High Sensitivity): 26 ng/L — ABNORMAL HIGH (ref <18)
Troponin I (High Sensitivity): 25 ng/L — ABNORMAL HIGH (ref <18)

## 2018-12-01 LAB — COMPREHENSIVE METABOLIC PANEL
ALT: 16 U/L (ref 0–44)
AST: 18 U/L (ref 15–41)
Albumin: 2.2 g/dL — ABNORMAL LOW (ref 3.5–5.0)
Alkaline Phosphatase: 77 U/L (ref 38–126)
Anion gap: 9 (ref 5–15)
BUN: 48 mg/dL — ABNORMAL HIGH (ref 8–23)
CO2: 28 mmol/L (ref 22–32)
Calcium: 9.9 mg/dL (ref 8.9–10.3)
Chloride: 96 mmol/L — ABNORMAL LOW (ref 98–111)
Creatinine, Ser: 3 mg/dL — ABNORMAL HIGH (ref 0.61–1.24)
GFR calc Af Amer: 25 mL/min — ABNORMAL LOW (ref >60)
GFR calc non Af Amer: 21 mL/min — ABNORMAL LOW (ref >60)
Glucose, Bld: 132 mg/dL — ABNORMAL HIGH (ref 70–99)
Potassium: 3.4 mmol/L — ABNORMAL LOW (ref 3.5–5.1)
Sodium: 133 mmol/L — ABNORMAL LOW (ref 135–145)
Total Bilirubin: 0.4 mg/dL (ref 0.3–1.2)
Total Protein: 6.5 g/dL (ref 6.5–8.1)

## 2018-12-01 LAB — LACTIC ACID, PLASMA
Lactic Acid, Venous: 2.7 mmol/L (ref 0.5–1.9)
Lactic Acid, Venous: 1.9 mmol/L (ref 0.5–1.9)

## 2018-12-01 LAB — LIPASE, BLOOD: Lipase: 28 U/L (ref 11–51)

## 2018-12-01 MED ORDER — LORAZEPAM 2 MG/ML IJ SOLN
1.0000 mg | Freq: Once | INTRAMUSCULAR | Status: AC
  Administered 2018-12-01: 15:00:00 1 mg via INTRAVENOUS
  Filled 2018-12-01: qty 1

## 2018-12-01 MED ORDER — IOHEXOL 240 MG/ML SOLN
50.0000 mL | Freq: Once | INTRAMUSCULAR | Status: AC | PRN
  Administered 2018-12-01: 12:00:00 50 mL via ORAL

## 2018-12-01 MED ORDER — BISACODYL 5 MG PO TBEC: 5.0000 mg | DELAYED_RELEASE_TABLET | Freq: Every day | ORAL | Status: DC | PRN

## 2018-12-01 MED ORDER — PRO-STAT SUGAR FREE PO LIQD: 30.0000 mL | Freq: Every day | ORAL | Status: DC

## 2018-12-01 MED ORDER — METOPROLOL SUCCINATE ER 25 MG PO TB24
12.5000 mg | ORAL_TABLET | Freq: Every day | ORAL | Status: DC
  Administered 2018-12-03: 09:00:00 12.5 mg via ORAL
  Filled 2018-12-01: qty 1

## 2018-12-01 MED ORDER — DIGOXIN 250 MCG PO TABS: 0.2500 mg | ORAL_TABLET | Freq: Every day | ORAL | Status: DC

## 2018-12-01 MED ORDER — LORATADINE 10 MG PO TABS
10.0000 mg | ORAL_TABLET | Freq: Every day | ORAL | Status: DC
  Filled 2018-12-01: qty 1

## 2018-12-01 MED ORDER — ENOXAPARIN SODIUM 40 MG/0.4ML ~~LOC~~ SOLN: 40.0000 mg | SUBCUTANEOUS | Status: DC

## 2018-12-01 MED ORDER — DOCUSATE SODIUM 100 MG PO CAPS
100.0000 mg | ORAL_CAPSULE | Freq: Two times a day (BID) | ORAL | Status: DC
  Filled 2018-12-01: qty 1

## 2018-12-01 MED ORDER — ADULT MULTIVITAMIN W/MINERALS CH
1.0000 | ORAL_TABLET | Freq: Every day | ORAL | Status: DC
  Filled 2018-12-01: qty 1

## 2018-12-01 MED ORDER — IOHEXOL 300 MG/ML  SOLN
75.0000 mL | Freq: Once | INTRAMUSCULAR | Status: AC | PRN
  Administered 2018-12-01: 13:00:00 75 mL via INTRAVENOUS

## 2018-12-01 MED ORDER — SODIUM CHLORIDE 0.9 % IV SOLN
INTRAVENOUS | Status: DC
  Administered 2018-12-02 – 2018-12-03 (×2): via INTRAVENOUS

## 2018-12-01 MED ORDER — SENNA 8.6 MG PO TABS: 1.0000 | ORAL_TABLET | Freq: Every day | ORAL | Status: DC | PRN

## 2018-12-01 MED ORDER — SODIUM CHLORIDE 0.9 % IV SOLN
2.0000 g | Freq: Every day | INTRAVENOUS | Status: DC
  Filled 2018-12-01: qty 2

## 2018-12-01 MED ORDER — GABAPENTIN 300 MG PO CAPS
300.0000 mg | ORAL_CAPSULE | Freq: Two times a day (BID) | ORAL | Status: DC
  Administered 2018-12-03: 09:00:00 300 mg via ORAL
  Filled 2018-12-01: qty 1

## 2018-12-01 MED ORDER — OXYCODONE HCL 5 MG PO TABS
5.0000 mg | ORAL_TABLET | ORAL | Status: DC | PRN
  Administered 2018-12-03 (×2): 5 mg via ORAL
  Filled 2018-12-01 (×2): qty 1

## 2018-12-01 MED ORDER — ACETAMINOPHEN 650 MG RE SUPP: 650.0000 mg | Freq: Four times a day (QID) | RECTAL | Status: DC | PRN

## 2018-12-01 MED ORDER — LORAZEPAM 0.5 MG PO TABS
0.5000 mg | ORAL_TABLET | Freq: Every day | ORAL | Status: DC
  Administered 2018-12-03 – 2018-12-04 (×2): 0.5 mg via ORAL
  Filled 2018-12-01 (×2): qty 1

## 2018-12-01 MED ORDER — ACETAMINOPHEN 325 MG PO TABS: 650.0000 mg | ORAL_TABLET | Freq: Four times a day (QID) | ORAL | Status: DC | PRN

## 2018-12-01 MED ORDER — FINASTERIDE 5 MG PO TABS
5.0000 mg | ORAL_TABLET | Freq: Every day | ORAL | Status: DC
  Administered 2018-12-03: 09:00:00 5 mg via ORAL
  Filled 2018-12-01: qty 1

## 2018-12-01 MED ORDER — SODIUM CHLORIDE 0.9 % IV SOLN
1.0000 g | Freq: Once | INTRAVENOUS | Status: AC
  Administered 2018-12-01: 12:00:00 1 g via INTRAVENOUS
  Filled 2018-12-01: qty 1

## 2018-12-01 MED ORDER — SODIUM CHLORIDE 0.9 % IV BOLUS
1000.0000 mL | Freq: Once | INTRAVENOUS | Status: AC
  Administered 2018-12-01: 15:00:00 1000 mL via INTRAVENOUS

## 2018-12-01 MED ORDER — HYDROXYZINE HCL 25 MG PO TABS: 25.0000 mg | ORAL_TABLET | Freq: Three times a day (TID) | ORAL | Status: DC | PRN

## 2018-12-01 MED ORDER — TRAZODONE HCL 50 MG PO TABS: 25.0000 mg | ORAL_TABLET | Freq: Every evening | ORAL | Status: DC | PRN

## 2018-12-01 MED ORDER — ONDANSETRON HCL 4 MG PO TABS: 4.0000 mg | ORAL_TABLET | Freq: Four times a day (QID) | ORAL | Status: DC | PRN

## 2018-12-01 MED ORDER — VANCOMYCIN HCL IN DEXTROSE 1-5 GM/200ML-% IV SOLN
1000.0000 mg | Freq: Once | INTRAVENOUS | Status: AC
  Administered 2018-12-01: 13:00:00 1000 mg via INTRAVENOUS
  Filled 2018-12-01: qty 200

## 2018-12-01 MED ORDER — VANCOMYCIN IV (FOR PTA / DISCHARGE USE ONLY): 1250.0000 mg | INTRAVENOUS | Status: DC

## 2018-12-01 MED ORDER — MORPHINE SULFATE (PF) 4 MG/ML IV SOLN
4.0000 mg | Freq: Once | INTRAVENOUS | Status: AC
  Administered 2018-12-01: 4 mg via INTRAVENOUS
  Filled 2018-12-01: qty 1

## 2018-12-01 MED ORDER — ONDANSETRON HCL 4 MG/2ML IJ SOLN: 4.0000 mg | Freq: Four times a day (QID) | INTRAMUSCULAR | Status: DC | PRN

## 2018-12-01 NOTE — ED Triage Notes (Signed)
Pt from [facility] via [facility]. Per EMS, pt sent from [facility] due to abnormal labs. Low HCT/HGB. Per Ems pt was in a group home where he fell resulting in a hip fracture and sent to [facility] for rehab. MD Malinda at bedside.

## 2018-12-01 NOTE — ED Provider Notes (Signed)
[facility] Emergency Department Provider Note   ____________________________________________   First MD Initiated Contact with Patient 12/01/18 1154     (approximate)  I have reviewed the triage vital signs and the nursing notes.   HISTORY  Chief Complaint Abnormal Lab   HPI Ruben Clark is a 63 y.o. male sent from [facility] for abnormally low H&H.  Review of the labs that he had done shows he also had a white count of 23,000 and a temperature of 94 apparently orally.  Temperature repeated here orally was 94 again but rectally is 96.7.  Patient has had surgery recently apparently on his bladder and he has a large red area over the surgical scar with some oozing on it.  It is not tender but it is very firm around the area.  CT was ordered along with blood work.  Patient went to CT and blood work came back apparently the creatinine was 3 GFR is 21 he had gotten 15 cc of contrast instead of the reduced dose of 75.  I had begun hydrating him already.  We will give him more hydration as well.  I do not think 15 cc of contrast will cause a great deal of problem.        Past Medical History:  Diagnosis Date  . CHF (congestive heart failure) ([place])   . Chronic kidney disease (CKD), stage III (moderate) (H[facility])   . Hypertension   . Renal insufficiency     Patient Active Problem List   Diagnosis Date Noted  . Hypercalcemia 11/10/2018  . Closed left hip fracture ([place]) 10/19/2018  . Chronic systolic CHF (congestive heart failure) ([place]) 10/19/2018  . HTN (hypertension) 10/19/2018  . Sepsis ([place]) 10/07/2018  . COPD (chronic obstructive pulmonary disease) ([place]) 08/26/2018  . Atrial fibrillation ([place]) 08/26/2018  . Left foot infection 08/02/2018    Past Surgical History:  Procedure Laterality Date  . AMPUTATION Left 08/03/2018   Procedure: AMPUTATION RAY SECOND TOE;  Surgeon: Sharlotte Alamo, DPM;  Location: [facility] ORS;  Service: Podiatry;  Laterality: Left;  .  CYSTOSCOPY W/ RETROGRADES Right 11/17/2018   Procedure: CYSTOSCOPY WITH RETROGRADE PYELOGRAM;  Surgeon: Abbie Sons, MD;  Location: [facility] ORS;  Service: Urology;  Laterality: Right;  . HIP PINNING,CANNULATED Left 10/19/2018   Procedure: CANNULATED HIP PINNING;  Surgeon: Hessie Knows, MD;  Location: [facility] ORS;  Service: Orthopedics;  Laterality: Left;  . IRRIGATION AND DEBRIDEMENT FOOT Left 08/05/2018   Procedure: IRRIGATION AND DEBRIDEMENT FOOT;  Surgeon: Sharlotte Alamo, DPM;  Location: [facility] ORS;  Service: Podiatry;  Laterality: Left;  . TOE AMPUTATION    . TRANSURETHRAL RESECTION OF BLADDER TUMOR N/A 11/17/2018   Procedure: TRANSURETHRAL RESECTION OF BLADDER TUMOR (TURBT);  Surgeon: Abbie Sons, MD;  Location: [facility] ORS;  Service: Urology;  Laterality: N/A;    Prior to Admission medications   Medication Sig Start Date End Date Taking? Authorizing Provider  acetaminophen (TYLENOL) 500 MG tablet Take 500 mg by mouth every 4 (four) hours as needed.   Yes [provider]  Amino Acids-Protein Hydrolys (FEEDING SUPPLEMENT, PRO-STAT SUGAR FREE 64,) LIQD Take 30 mLs by mouth daily.   Yes [provider]  apixaban (ELIQUIS) 5 MG TABS tablet Take 1 tablet (5 mg total) by mouth 2 (two) times daily. 08/09/18  Yes Dustin Flock, MD  aspirin EC 81 MG tablet Take 81 mg by mouth daily.   Yes [provider]  bisacodyl (DULCOLAX) 5 MG EC tablet Take 1  tablet (5 mg total) by mouth daily as needed for moderate constipation. 10/21/18  Yes Demetrios Loll, MD  digoxin (LANOXIN) 0.25 MG tablet Take 0.25 mg by mouth daily.   Yes [provider]  docusate sodium (COLACE) 100 MG capsule Take 100 mg by mouth 2 (two) times daily.   Yes [provider]  doxycycline (VIBRAMYCIN) 100 MG capsule Take 100 mg by mouth 2 (two) times daily.   Yes [provider]  finasteride (PROSCAR) 5 MG tablet Take 1 tablet (5 mg total) by mouth daily. 08/09/18  Yes Dustin Flock, MD   gabapentin (NEURONTIN) 300 MG capsule Take 300 mg by mouth 2 (two) times daily.    Yes [provider]  hydrOXYzine (ATARAX/VISTARIL) 25 MG tablet Take 25 mg by mouth every 8 (eight) hours as needed for itching.    Yes [provider]  loratadine (CLARITIN) 10 MG tablet Take 10 mg by mouth daily.   Yes [provider]  LORazepam (ATIVAN) 0.5 MG tablet Take 1 tablet (0.5 mg total) by mouth daily. 11/21/18  Yes Mody, Ulice Bold, MD  metoprolol succinate (TOPROL-XL) 25 MG 24 hr tablet Take 0.5 tablets (12.5 mg total) by mouth daily. 10/10/18 12/01/18 Yes Pyreddy, Reatha Harps, MD  Multiple Vitamin (MULTIVITAMIN WITH MINERALS) TABS tablet Take 1 tablet by mouth daily.   Yes [provider]  senna (SENOKOT) 8.6 MG TABS tablet Take 1 tablet (8.6 mg total) by mouth daily as needed for mild constipation. 10/21/18  Yes Demetrios Loll, MD  vancomycin IVPB Inject 1,250 mg into the vein every other day for 19 days. Next dose to be given 8/19 at 10am Indication:  MRSA bacteremia Last Day of Therapy:  12/13/18 Labs: CBC with diff/ CMP and vanco trough every Monday while on antibiotics. Vanco trough and BMP every Thursday while on antibiotics. PICC care including placement of biopatch. Remove PICC at completion of treatment. Fax results to Dr. Delaine Lame at [phone]. Follow up appt with Dr. Delaine Lame in 2 weeks . Call [phone] for appt.  Dose to be adjusted according to vanco trough and cr . Keep the trough between 15-20 Patient taking differently: Inject 750 mg into the vein daily.  11/24/18 12/13/18 Yes Bettey Costa, MD    Allergies 5-alpha reductase inhibitors  History reviewed. No pertinent family history.  Social History Social History   Tobacco Use  . Smoking status: Current Every Day Smoker  . Smokeless tobacco: Never Used  Substance Use Topics  . Alcohol use: Never    Frequency: Never  . Drug use: Never    Review of Systems  Constitutional: No fever/chills Eyes: No  visual changes. ENT: No sore throat. Cardiovascular: Denies chest pain. Respiratory: Denies shortness of breath. Gastrointestinal: No abdominal pain.  No nausea, no vomiting.  No diarrhea.  No constipation. Genitourinary: Negative for dysuria. Musculoskeletal: Negative for back pain. Skin: Negative for rash. Neurological: Negative for headaches, focal weakness   ____________________________________________   PHYSICAL EXAM:  VITAL SIGNS: ED Triage Vitals  Enc Vitals Group     BP 12/01/18 1152 101/60     Pulse Rate 12/01/18 1309 (!) 54     Resp 12/01/18 1200 15     Temp 12/01/18 1309 (!) 96.7 F (35.9 C)     Temp Source 12/01/18 1309 Rectal     SpO2 12/01/18 1309 100 %     Weight 12/01/18 1152 159 lb 13.3 oz (72.5 kg)     Height 12/01/18 1152 5\' 8"  (1.727 m)  Head Circumference --      [facility] --      Pain Score 12/01/18 1152 0     Pain Loc --      Pain Edu? --      Excl. in [place]? --     Constitutional: Alert and oriented.  Very thin and pale but otherwise well appearing and in no acute distress! Eyes: Conjunctivae are normal.  Head: Atraumatic. Nose: No congestion/rhinnorhea. Mouth/Throat: Mucous membranes are moist.  Oropharynx non-erythematous. Neck: No stridor. Cardiovascular: Normal rate, regular rhythm. Grossly normal heart sounds.  Good peripheral circulation. Respiratory: Normal respiratory effort.  No retractions. Lungs CTAB. Gastrointestinal: Soft and nontender.  He does have a 10 to 12 cm long and 6 cm wide area of erythema with some bulging and oozing in the middle of it in the lower abdomen about 4 cm on either side of erythema of the abdomen is very firm but not tender slight distention. No abdominal bruits. No CVA tenderness. Musculoskeletal: No lower extremity tenderness nor edema.  No joint effusions. Neurologic:  Normal speech and language. No gross focal neurologic deficits are appreciated. No gait instability. Skin:  Skin is warm, dry and intact. No  rash noted.   ____________________________________________   LABS (all labs ordered are listed, but only abnormal results are displayed)  Labs Reviewed  COMPREHENSIVE METABOLIC PANEL - Abnormal; Notable for the following components:      Result Value   Sodium 133 (*)    Potassium 3.4 (*)    Chloride 96 (*)    Glucose, Bld 132 (*)    BUN 48 (*)    Creatinine, Ser 3.00 (*)    Albumin 2.2 (*)    GFR calc non Af Amer 21 (*)    GFR calc Af Amer 25 (*)    All other components within normal limits  CBC WITH DIFFERENTIAL/PLATELET - Abnormal; Notable for the following components:   RBC 2.64 (*)    Hemoglobin 7.4 (*)    HCT 23.8 (*)    RDW 16.5 (*)    Platelets 502 (*)    All other components within normal limits  LACTIC ACID, PLASMA - Abnormal; Notable for the following components:   Lactic Acid, Venous 2.7 (*)    All other components within normal limits  TROPONIN I (HIGH SENSITIVITY) - Abnormal; Notable for the following components:   Troponin I (High Sensitivity) 26 (*)    All other components within normal limits  NOVEL CORONAVIRUS, NAA (HOSP ORDER, SEND-OUT TO REF LAB; TAT 18-24 HRS)  LIPASE, BLOOD  LACTIC ACID, PLASMA  URINALYSIS, COMPLETE (UACMP) WITH MICROSCOPIC  TROPONIN I (HIGH SENSITIVITY)   ____________________________________________  EKG   ____________________________________________  RADIOLOGY  ED MD interpretation: CT read by radiology reviewed by me shows what appears to be his known squamous cell bladder tumor eroding into the abdominal wall.  There is air in the mass.  There is continued bilateral hydronephrosis the Foley is not in the right place and he may be developing small bowel obstruction.  Official radiology report(s): Ct Abdomen Pelvis Wo Contrast  Result Date: 12/01/2018 CLINICAL DATA:  Abdominal infection. 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 EXAM: CT ABDOMEN AND PELVIS WITHOUT CONTRAST TECHNIQUE: Multidetector CT imaging of the  abdomen and pelvis was performed following the standard protocol without IV contrast. COMPARISON:  CT scan dated 11/12/2018 FINDINGS: Lower chest: New small left pleural effusion and new tiny right pleural effusion. New focal slight atelectasis in the left lower lobe.  Tiny calcified granulomas in the left lower lobe. Heart size is normal. Hepatobiliary: Multiple low-density lesions in the liver consistent with benign cysts, unchanged. The largest is 6.1 cm in the caudate lobe of the liver. Biliary tree appears normal. Pancreas: Unremarkable. No pancreatic ductal dilatation or surrounding inflammatory changes. Spleen: Normal size.  Calcified granulomas in the spleen. Adrenals/Urinary Tract: Normal adrenal glands. Persistent bilateral hydronephrosis. The ureters are dilated into the pelvis where they are compressed by a large inhomogeneous 14 x 14 x 12 cm bladder mass. The mass has cystic and solid components as well as scattered bubbles of air in the mass. The mass erodes through the midline of the anterior abdominal wall into the subcutaneous soft tissues. This most likely arises in the bladder but the adjacent seminal vesicles are not identified. There is a Foley catheter tip is in the prostatic urethra. The Foley catheter balloon is inflated within the penile urethra. Stomach/Bowel: The stomach is distended with oral contrast. There are multiple distended small bowel loops in the left mid abdomen. However contrast has passed into the nondistended colon. The bowel in the pelvis is compressed by the large mass. Vascular/Lymphatic: Aortic atherosclerosis. Reproductive: Calcifications in compressed prostate gland. Other: Anasarca. Musculoskeletal: No acute abnormality.  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 IMPRESSION: 1. Findings consistent with interval infection of the complex bladder mass, now eroding through the anterior abdominal wall. 2. Persistent marked bilateral hydronephrosis. 3. Foley catheter tip is in the prosthetic  urethra. Foley balloon is inflated in the penile urethra. 4. New distention of small bowel loops in the left mid abdomen. This could represent new partial small bowel obstruction. Electronically Signed   By: Lorriane Shire M.D.   On: 12/01/2018 14:22    ____________________________________________   PROCEDURES  Procedure(s) performed (including Critical Care): Critical care time 35 minutes including discussing the patient with the urologist Dr. Erlene Quan and with the hospitalist and talking to the patient about whether or not he wants palliative care not.  He does not want palliative care.  Procedures   ____________________________________________   INITIAL IMPRESSION / ASSESSMENT AND PLAN / ED COURSE Patient with an apparent infected bladder tumor status post surgery.  Dr. Erlene Quan says she will get urology come in to fix the Foley catheter which is not in the right place and then I will talk to the hospitalist about the infection.  Patient does not want to be palliative care.  Patient may also be developing small bowel obstruction which will be a surgical consult.  UA is still pending.               ____________________________________________   FINAL CLINICAL IMPRESSION(S) / ED DIAGNOSES  Final diagnoses:  Intra-abdominal infection  Hydronephrosis, unspecified hydronephrosis type     ED Discharge Orders    None       Note:  This document was prepared using Dragon voice recognition software and may include unintentional dictation errors.    Nena Polio, MD 12/01/18 [phone]

## 2018-12-01 NOTE — ED Notes (Signed)
Lactic Acid 2.7

## 2018-12-01 NOTE — ED Notes (Signed)
Patient transported to CT 

## 2018-12-01 NOTE — ED Notes (Signed)
ED Provider Malinda  at bedside. 

## 2018-12-01 NOTE — ED Notes (Signed)
Patient is resting comfortably. 

## 2018-12-01 NOTE — Telephone Encounter (Signed)
Kim at [facility] called regarding this patient who is a new Referral for Dr B this afternoon at 3 PM stating that he needs a blood transfusion because his hgb/hct is trending down and is now 6.3/ 19.5. She was asking if we can do blood transfusion or if they need to send him to the hospital for it. I discussed with Vickki Muff, RN and he cannot be added for transfusion this week due to acuity in infusion suite so I called Kim back and advised that we cannot accommodate patient for blood this week. She said she will call Dr Lovie Macadamia and send patient to ER for transfusion and will reschedule his appointment for Dr B at a later date. Dr Sharmaine Base team informed

## 2018-12-01 NOTE — Consult Note (Addendum)
Urology Consult  I have been asked to see the patient by Dr. Cinda Quest, for evaluation and management of misplaced Foley and progressed bladder cancer with bilateral severe hydronephrosis.  Chief Complaint: See above  History of Present Illness: Ruben Clark is a 63 y.o. year old male with PMH CHF, CKD stage III, hypertension, and known squamous cell bladder tumor last hospitalized earlier this month EMS today from [facility] with concerns for low H&H/ elevated.  CT abdomen pelvis without contrast revealed persistent bilateral severe hydronephrosis with progression of known bladder tumor featuring erosion through the abdominal wall.  Additionally, Foley catheter is misplaced with balloon inflated in the penile urethra.  Patient denies pain and is unable to say when the abdominal erosion first appeared.  It was not noted during his hospitalization earlier this month.  Notably, during last admission, he refused bilateral PCNs initially but then Cr started to improve.  Stents were unable to be placed.  Palliative care was consulted.  Hospice was considered.  Patient has a health care power of attorney.   Please see previous Urology notes for details.    Past Medical History:  Diagnosis Date   CHF (congestive heart failure) ([place])    Chronic kidney disease (CKD), stage III (moderate) ([place])    Hypertension    Renal insufficiency     Past Surgical History:  Procedure Laterality Date   AMPUTATION Left 08/03/2018   Procedure: AMPUTATION RAY SECOND TOE;  Surgeon: Sharlotte Alamo, DPM;  Location: [facility];  Service: Podiatry;  Laterality: Left;   CYSTOSCOPY W/ RETROGRADES Right 11/17/2018   Procedure: CYSTOSCOPY WITH RETROGRADE PYELOGRAM;  Surgeon: Abbie Sons, MD;  Location: [facility];  Service: Urology;  Laterality: Right;   HIP PINNING,CANNULATED Left 10/19/2018   Procedure: CANNULATED HIP PINNING;  Surgeon: Hessie Knows, MD;  Location: [facility] OR[facility];  Service: Orthopedics;  Laterality:  Left;   IRRIGATION AND DEBRIDEMENT FOOT Left 08/05/2018   Procedure: IRRIGATION AND DEBRIDEMENT FOOT;  Surgeon: Sharlotte Alamo, DPM;  Location: [facility];  Service: Podiatry;  Laterality: Left;   TOE AMPUTATION     TRANSURETHRAL RESECTION OF BLADDER TUMOR N/A 11/17/2018   Procedure: TRANSURETHRAL RESECTION OF BLADDER TUMOR (TURBT);  Surgeon: Abbie Sons, MD;  Location: [facility];  Service: Urology;  Laterality: N/A;    Home Medications:  Current Meds  Medication Sig   acetaminophen (TYLENOL) 500 MG tablet Take 500 mg by mouth every 4 (four) hours as needed.   Amino Acids-Protein Hydrolys (FEEDING SUPPLEMENT, PRO-STAT SUGAR FREE 64,) LIQD Take 30 mLs by mouth daily.   apixaban (ELIQUIS) 5 MG TABS tablet Take 1 tablet (5 mg total) by mouth 2 (two) times daily.   aspirin EC 81 MG tablet Take 81 mg by mouth daily.   bisacodyl (DULCOLAX) 5 MG EC tablet Take 1 tablet (5 mg total) by mouth daily as needed for moderate constipation.   digoxin (LANOXIN) 0.25 MG tablet Take 0.25 mg by mouth daily.   docusate sodium (COLACE) 100 MG capsule Take 100 mg by mouth 2 (two) times daily.   doxycycline (VIBRAMYCIN) 100 MG capsule Take 100 mg by mouth 2 (two) times daily.   finasteride (PROSCAR) 5 MG tablet Take 1 tablet (5 mg total) by mouth daily.   gabapentin (NEURONTIN) 300 MG capsule Take 300 mg by mouth 2 (two) times daily.    hydrOXYzine (ATARAX/VISTARIL) 25 MG tablet Take 25 mg by mouth every 8 (eight) hours as needed for itching.    loratadine (CLARITIN)  10 MG tablet Take 10 mg by mouth daily.   LORazepam (ATIVAN) 0.5 MG tablet Take 1 tablet (0.5 mg total) by mouth daily.   metoprolol succinate (TOPROL-XL) 25 MG 24 hr tablet Take 0.5 tablets (12.5 mg total) by mouth daily.   Multiple Vitamin (MULTIVITAMIN WITH MINERALS) TABS tablet Take 1 tablet by mouth daily.   senna (SENOKOT) 8.6 MG TABS tablet Take 1 tablet (8.6 mg total) by mouth daily as needed for mild constipation.    vancomycin IVPB Inject 1,250 mg into the vein every other day for 19 days. Next dose to be given 8/19 at 10am Indication:  MRSA bacteremia Last Day of Therapy:  12/13/18 Labs: CBC with diff/ CMP and vanco trough every Monday while on antibiotics. Vanco trough and BMP every Thursday while on antibiotics. PICC care including placement of biopatch. Remove PICC at completion of treatment. Fax results to Dr. Delaine Lame at [phone]. Follow up appt with Dr. Delaine Lame in 2 weeks . Call [phone] for appt.  Dose to be adjusted according to vanco trough and cr . Keep the trough between 15-20 (Patient taking differently: Inject 750 mg into the vein daily. )    Allergies:  Allergies  Allergen Reactions   5-Alpha Reductase Inhibitors     History reviewed. No pertinent family history.  Social History:  reports that he has been smoking. He has never used smokeless tobacco. He reports that he does not drink alcohol or use drugs.  ROS: A complete review of systems was performed. + fatigue  All systems are negative except for pertinent findings as noted.  Physical Exam:  Vital signs in last 24 hours: Temp:  [96.7 F (35.9 C)] 96.7 F (35.9 C) (08/26 1309) Pulse Rate:  [50-75] 52 (08/26 1600) Resp:  [13-24] 24 (08/26 1600) BP: (101-123)/(60-79) 121/79 (08/26 1600) SpO2:  [67 %-100 %] 100 % (08/26 1600) Weight:  [72.5 kg] 72.5 kg (08/26 1152) Constitutional: Awake and alert, lying supine in bed, no acute distress.  Patient underweight and pale. HEENT:  AT, moist mucus membranes Cardiovascular: No clubbing, cyanosis, or edema. Respiratory: Normal respiratory effort GI: Fixed, firm mass in the lower midline abdomen with superimposed ulcerated lesion approximately 10 x 4 cm, weeping clear fluid with odor suggestive of urine.   GU: Uncircumcised penis with Foley catheter in place. Skin: No rashes, bruises or suspicious lesions Neurologic: Grossly intact, no focal deficits, moving all 4  extremities Psychiatric: Delayed responses, asking questions suggesting poor comprehension of current medical problem  Laboratory Data:  Recent Labs    12/01/18 1224  WBC 10.0  HGB 7.4*  HCT 23.8*   Recent Labs    12/01/18 1224  NA 133*  K 3.4*  CL 96*  CO2 28  GLUCOSE 132*  BUN 48*  CREATININE 3.00*  CALCIUM 9.9   No results for input(s): LABPT, INR in the last 72 hours. No results for input(s): LABURIN in the last 72 hours. Urinalysis    Component Value Date/Time   COLORURINE YELLOW (A) 12/01/2018 1530   APPEARANCEUR TURBID (A) 12/01/2018 1530   LABSPEC 1.012 12/01/2018 1530   PHURINE 6.0 12/01/2018 1530   GLUCOSEU NEGATIVE 12/01/2018 1530   HGBUR SMALL (A) 12/01/2018 1530   BILIRUBINUR NEGATIVE 12/01/2018 1530   KETONESUR NEGATIVE 12/01/2018 1530   PROTEINUR 100 (A) 12/01/2018 1530   NITRITE NEGATIVE 12/01/2018 1530   LEUKOCYTESUR SMALL (A) 12/01/2018 1530     Radiologic Imaging: Ct Abdomen Pelvis Wo Contrast  Result Date: 12/01/2018 CLINICAL DATA:  Abdominal  infection. 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 EXAM: CT ABDOMEN AND PELVIS WITHOUT CONTRAST TECHNIQUE: Multidetector CT imaging of the abdomen and pelvis was performed following the standard protocol without IV contrast. COMPARISON:  CT scan dated 11/12/2018 FINDINGS: Lower chest: New small left pleural effusion and new tiny right pleural effusion. New focal slight atelectasis in the left lower lobe. Tiny calcified granulomas in the left lower lobe. Heart size is normal. Hepatobiliary: Multiple low-density lesions in the liver consistent with benign cysts, unchanged. The largest is 6.1 cm in the caudate lobe of the liver. Biliary tree appears normal. Pancreas: Unremarkable. No pancreatic ductal dilatation or surrounding inflammatory changes. Spleen: Normal size.  Calcified granulomas in the spleen. Adrenals/Urinary Tract: Normal adrenal glands. Persistent bilateral hydronephrosis. The ureters are  dilated into the pelvis where they are compressed by a large inhomogeneous 14 x 14 x 12 cm bladder mass. The mass has cystic and solid components as well as scattered bubbles of air in the mass. The mass erodes through the midline of the anterior abdominal wall into the subcutaneous soft tissues. This most likely arises in the bladder but the adjacent seminal vesicles are not identified. There is a Foley catheter tip is in the prostatic urethra. The Foley catheter balloon is inflated within the penile urethra. Stomach/Bowel: The stomach is distended with oral contrast. There are multiple distended small bowel loops in the left mid abdomen. However contrast has passed into the nondistended colon. The bowel in the pelvis is compressed by the large mass. Vascular/Lymphatic: Aortic atherosclerosis. Reproductive: Calcifications in compressed prostate gland. Other: Anasarca. Musculoskeletal: No acute abnormality.  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 IMPRESSION: 1. Findings consistent with interval infection of the complex bladder mass, now eroding through the anterior abdominal wall. 2. Persistent marked bilateral hydronephrosis. 3. Foley catheter tip is in the prosthetic urethra. Foley balloon is inflated in the penile urethra. 4. New distention of small bowel loops in the left mid abdomen. This could represent new partial small bowel obstruction. Electronically Signed   By: Lorriane Shire M.D.   On: 12/01/2018 14:22   I personally reviewed the above imaging and agree with the radiologic findings.  I note progression of bladder tumor through the anterior abdominal wall, misplaced Foley catheter, and bilateral hydronephrosis.  Assessment & Plan:   1. Squamous cell bladder cancer, advanced Cancer continues to progress with new tumor erosion through the anterior abdominal wall.  Cancer is too advanced at this point for surgical intervention.  Prognosis anticipated to be extremely poor at this time.  Recommend palliative care  consult with hospice.  Of note, patient appears to have very poor insight into the seriousness of this condition.  He asks today what we can do to treat his cancer.  He also asks if his cancer will "go away."  I explained that his cancer is untreatable at this point, that it will not go away, and that it was likely terminal.  He expressed understanding of this.  Strongly recommend palliative care consult.  2. Bilateral hydronephrosis Recommend consideration placement of bilateral nephrostomy tubes for alleviation of bilateral hydronephrosis given prior inability to place ureteral stents.  Patient has previously refused these along with other invasive treatment options secondary to concerns for "putting tubes down my throat."  I explained today that nephrostomy tubes were the only remaining option for alleviating the hydronephrosis and preventing renal damage and failure.  In our conversation today, he expressed willingness to undergo this procedure in an attempt to keep himself  alive as long as possible.   Cr is up to 3.0 up from previous discharged baseline, likely form ongoing/ worsening ureteral obstruction as well as nondraining bladder.   3. Misplaced Foley catheter We performed a catheter exchange today:  Approximately 62mL of water was removed from the balloon and a 20FR three-way Couvellaire hematuria catheter was removed with immediate return of bright red blood from the urethral meatus. Bleeding stopped after several minutes of applied pressure to the tip of the penis. For replacement, patient was premedicated with IV morphine and ativan for pain and anxiety control. He was somnolent throughout the procedure. Patient was cleaned and prepped in a sterile fashion with betadine and 2% lidocaine jelly was instilled into the urethra. A 22FR coude tip hematuria catheter was replaced into the bladder. >363mL opaque white urine was immediately drained from the bladder consistent with keratin debris. The  balloon was filled with 30ml of sterile water. An overnight bag was attached for drainage and secured with a leg strap. We opted not to irrigate the catheter at that time and instead inserted a plug into the irrigation port.     If patient proceeds with bilateral PCN placement, will likely be able to discontinue his Foley. -Consider discontinuing following PCN placement  Thank you for involving me in this patient's care, I will continue to follow along.  Debroah Loop, PA-C 12/01/2018 4:37 PM    I have seen and examined the patient, labs/ imaging reviewed and discussed  management with Sam Vaillancourt.  I reviewed the PA's note and agree with the documented findings and plan of care.  I personally replaced his catheter today.  Hollice Espy, MD

## 2018-12-01 NOTE — ED Notes (Signed)
Food tray at bedside at this time.

## 2018-12-01 NOTE — ED Notes (Signed)
Abd dressing changed.

## 2018-12-01 NOTE — ED Notes (Signed)
Lab report summary from [facility]) show a final approved date 11/26/2018 1403. WBC 23.3 HCT 22.8 HBG 7.3

## 2018-12-01 NOTE — ED Notes (Signed)
ED TO INPATIENT HANDOFF REPORT  ED Nurse Name and Phone #: [phone]  S Name/Age/Gender Walker Shadow 63 y.o. male Room/Bed: ED15A/ED15A  Code Status   Code Status: Prior  Home/SNF/Other Home A/Ox4 Is this baseline? Yes   Triage Complete: Triage complete  Chief Complaint low blood count  Triage Note Pt from [facility] via [facility]. Per EMS, pt sent from [facility] due to abnormal labs. Low HCT/HGB. Per Ems pt was in a group home where he fell resulting in a hip fracture and sent to [facility] for rehab. MD Malinda at bedside.    Allergies Allergies  Allergen Reactions  . 5-Alpha Reductase Inhibitors     Level of Care/Admitting Diagnosis ED Disposition    ED Disposition Condition Comment   Admit  The patient appears reasonably stabilized for admission considering the current resources, flow, and capabilities available in the ED at this time, and I doubt any other [facility] requiring further screening and/or treatment in the ED prior to admission is  present.       B Medical/Surgery History Past Medical History:  Diagnosis Date  . CHF (congestive heart failure) ([place])   . Chronic kidney disease (CKD), stage III (moderate) (HCC)   . Hypertension   . Renal insufficiency    Past Surgical History:  Procedure Laterality Date  . AMPUTATION Left 08/03/2018   Procedure: AMPUTATION RAY SECOND TOE;  Surgeon: Sharlotte Alamo, DPM;  Location: [facility] ORS;  Service: Podiatry;  Laterality: Left;  . CYSTOSCOPY W/ RETROGRADES Right 11/17/2018   Procedure: CYSTOSCOPY WITH RETROGRADE PYELOGRAM;  Surgeon: Abbie Sons, MD;  Location: [facility] ORS;  Service: Urology;  Laterality: Right;  . HIP PINNING,CANNULATED Left 10/19/2018   Procedure: CANNULATED HIP PINNING;  Surgeon: Hessie Knows, MD;  Location: [facility] ORS;  Service: Orthopedics;  Laterality: Left;  . IRRIGATION AND DEBRIDEMENT FOOT Left 08/05/2018   Procedure: IRRIGATION AND DEBRIDEMENT FOOT;  Surgeon: Sharlotte Alamo, DPM;  Location: [facility] ORS;  Service:  Podiatry;  Laterality: Left;  . TOE AMPUTATION    . TRANSURETHRAL RESECTION OF BLADDER TUMOR N/A 11/17/2018   Procedure: TRANSURETHRAL RESECTION OF BLADDER TUMOR (TURBT);  Surgeon: Abbie Sons, MD;  Location: [facility] ORS;  Service: Urology;  Laterality: N/A;     A IV Location/Drains/Wounds Patient Lines/Drains/Airways Status   Active Line/Drains/Airways    Name:   Placement date:   Placement time:   Site:   Days:   Peripheral IV 12/01/18 Left Antecubital   12/01/18    1219    Antecubital   less than 1   PICC Single Lumen 99991111 PICC Left Basilic 45 cm 0 cm   99991111    [id number]    Basilic   123456   PICC Single Lumen 99991111 PICC Right Basilic 43 cm 0 cm   99991111    123456    Basilic   9   Urethral Catheter dr Bernardo Heater Straight-tip;Triple-lumen 20 Fr.   11/17/18    1315    Straight-tip;Triple-lumen   14   Airway   11/17/18    1150     14   Incision (Closed) 08/05/18 Foot Left   08/05/18    1249     118   Incision (Closed) 11/17/18 Penis Other (Comment)   11/17/18    1239     14   Wound / Incision (Open or Dehisced) 08/02/18 Other (Comment) Foot Left   08/02/18    1537    Foot   121  Intake/Output Last 24 hours No intake or output data in the 24 hours ending 12/01/18 1516  Labs/Imaging Results for orders placed or performed during the hospital encounter of 12/01/18 (from the past 48 hour(s))  Comprehensive metabolic panel     Status: Abnormal   Collection Time: 12/01/18 12:24 PM  Result Value Ref Range   Sodium 133 (L) 135 - 145 mmol/L   Potassium 3.4 (L) 3.5 - 5.1 mmol/L   Chloride 96 (L) 98 - 111 mmol/L   CO2 28 22 - 32 mmol/L   Glucose, Bld 132 (H) 70 - 99 mg/dL   BUN 48 (H) 8 - 23 mg/dL   Creatinine, Ser 3.00 (H) 0.61 - 1.24 mg/dL   Calcium 9.9 8.9 - 10.3 mg/dL   Total Protein 6.5 6.5 - 8.1 g/dL   Albumin 2.2 (L) 3.5 - 5.0 g/dL   AST 18 15 - 41 U/L   ALT 16 0 - 44 U/L   Alkaline Phosphatase 77 38 - 126 U/L   Total Bilirubin 0.4 0.3 - 1.2 mg/dL   GFR calc non Af  Amer 21 (L) >60 mL/min   GFR calc Af Amer 25 (L) >60 mL/min   Anion gap 9 5 - 15    Comment: Performed at [facility], [address] [place], [place] [address]  Lipase, blood     Status: None   Collection Time: 12/01/18 12:24 PM  Result Value Ref Range   Lipase 28 11 - 51 U/L    Comment: Performed at [facility], [place], [place] [address]  Troponin I (High Sensitivity)     Status: Abnormal   Collection Time: 12/01/18 12:24 PM  Result Value Ref Range   Troponin I (High Sensitivity) 26 (H) <18 ng/L    Comment: (NOTE) Elevated high sensitivity troponin I (hsTnI) values and significant  changes across serial measurements may suggest ACS but many other  chronic and acute conditions are known to elevate hsTnI results.  Refer to the "Links" section for chest pain algorithms and additional  guidance. Performed at [facility], [address] [place], [place] [address]   CBC with Differential     Status: Abnormal   Collection Time: 12/01/18 12:24 PM  Result Value Ref Range   WBC 10.0 4.0 - 10.5 K/uL   RBC 2.64 (L) 4.22 - 5.81 MIL/uL   Hemoglobin 7.4 (L) 13.0 - 17.0 g/dL   HCT 23.8 (L) 39.0 - 52.0 %   MCV 90.2 80.0 - 100.0 fL   MCH 28.0 26.0 - 34.0 pg   MCHC 31.1 30.0 - 36.0 g/dL   RDW 16.5 (H) 11.5 - 15.5 %   Platelets 502 (H) 150 - 400 K/uL   nRBC 0.0 0.0 - 0.2 %   Neutrophils Relative % 71 %   Neutro Abs 7.2 1.7 - 7.7 K/uL   Lymphocytes Relative 14 %   Lymphs Abs 1.4 0.7 - 4.0 K/uL   Monocytes Relative 10 %   Monocytes Absolute 1.0 0.1 - 1.0 K/uL   Eosinophils Relative 3 %   Eosinophils Absolute 0.3 0.0 - 0.5 K/uL   Basophils Relative 1 %   Basophils Absolute 0.1 0.0 - 0.1 K/uL   Immature Granulocytes 1 %   Abs Immature Granulocytes 0.05 0.00 - 0.07 K/uL    Comment: Performed at [facility], [address] [place], [place] [address]  Lactic acid, plasma     Status: Abnormal   Collection Time: 12/01/18 12:24 PM  Result Value Ref Range   Lactic Acid, Venous 2.7 (HH) 0.5 - 1.9 mmol/L    Comment: CRITICAL RESULT CALLED TO, READ BACK BY AND VERIFIED WITH TIFFANY JOHNSON AT 1311 ON 12/01/2018 [place]. Performed at [facility], [address] [place], [place] [address]   Lactic acid, plasma     Status: None   Collection Time: 12/01/18  2:34 PM  Result Value Ref Range   Lactic Acid, Venous 1.9 0.5 - 1.9 mmol/L    Comment: Performed at [facility], [address], [place], [place] [address]   Ct Abdomen Pelvis Wo Contrast  Result Date: 12/01/2018 CLINICAL DATA:  Abdominal infection. 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 EXAM: CT ABDOMEN AND PELVIS WITHOUT CONTRAST TECHNIQUE: Multidetector CT imaging of the abdomen and pelvis was performed following the standard protocol without IV contrast. COMPARISON:  CT scan dated 11/12/2018 FINDINGS: Lower chest: New small left pleural effusion and new tiny right pleural effusion. New focal slight atelectasis in the left lower lobe. Tiny calcified granulomas in the left lower lobe. Heart size is normal. Hepatobiliary: Multiple low-density lesions in the liver consistent with benign cysts, unchanged. The largest is 6.1 cm in the caudate lobe of the liver. Biliary tree appears normal. Pancreas: Unremarkable. No pancreatic ductal dilatation or surrounding inflammatory changes. Spleen: Normal size.  Calcified granulomas in the spleen. Adrenals/Urinary Tract: Normal adrenal glands. Persistent bilateral hydronephrosis. The ureters are dilated into the pelvis where they are compressed by a large inhomogeneous 14 x 14 x 12 cm bladder mass. The mass has cystic and solid components as well as scattered bubbles of air in the mass. The mass erodes through the midline of the anterior abdominal wall into the subcutaneous soft tissues. This most likely arises in the bladder but the adjacent seminal vesicles are not identified. There is a Foley catheter tip is in  the prostatic urethra. The Foley catheter balloon is inflated within the penile urethra. Stomach/Bowel: The stomach is distended with oral contrast. There are multiple distended small bowel loops in the left mid abdomen. However contrast has passed into the nondistended colon. The bowel in the pelvis is compressed by the large mass. Vascular/Lymphatic: Aortic atherosclerosis. Reproductive: Calcifications in compressed prostate gland. Other: Anasarca. Musculoskeletal: No acute abnormality.  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 IMPRESSION: 1. Findings consistent with interval infection of the complex bladder mass, now eroding through the anterior abdominal wall. 2. Persistent marked bilateral hydronephrosis. 3. Foley catheter tip is in the prosthetic urethra. Foley balloon is inflated in the penile urethra. 4. New distention of small bowel loops in the left mid abdomen. This could represent new partial small bowel obstruction. Electronically Signed   By: Lorriane Shire M.D.   On: 12/01/2018 14:22    Pending Labs Unresulted Labs (From admission, onward)    Start     Ordered   12/01/18 1450  Urine culture  Once,   STAT     12/01/18 1449   12/01/18 1447  Novel Coronavirus, NAA (Hosp order, Send-out to [facility]; TAT 18-24 hrs  (Symptomatic/High Risk of Exposure/Tier 1 Patients Labs with Precautions)  Once,   STAT    Question Answer Comment  Is this test for diagnosis or screening Diagnosis of ill patient   Symptomatic for COVID-19 as defined by CDC Yes   Date of Symptom Onset 11/17/2018   Hospitalized for COVID-19 Yes   Admitted to ICU for COVID-19 No   Previously tested for COVID-19 Yes   Resident in a  congregate (group) care setting Yes   Employed in healthcare setting No      12/01/18 1446   12/01/18 1157  Urinalysis, Complete w Microscopic  ONCE - STAT,   STAT     12/01/18 1158          Vitals/Pain Today's Vitals   12/01/18 1200 12/01/18 1300 12/01/18 1309 12/01/18 1335  BP: 110/60 116/70 116/70    Pulse:   (!) 54   Resp: 15 20    Temp:   (!) 96.7 F (35.9 C)   TempSrc:   Rectal   SpO2:   100% 98%  Weight:      Height:      PainSc:   2      Isolation Precautions Airborne and Contact precautions  Medications Medications  LORazepam (ATIVAN) injection 1 mg (has no administration in time range)  morphine 4 MG/ML injection 4 mg (has no administration in time range)  ceFEPIme (MAXIPIME) 1 g in sodium chloride 0.9 % 100 mL IVPB (0 g Intravenous Stopped 12/01/18 1307)  vancomycin (VANCOCIN) IVPB 1000 mg/200 mL premix (0 mg Intravenous Stopped 12/01/18 1426)  iohexol (OMNIPAQUE) 240 MG/ML injection 50 mL (50 mLs Oral Contrast Given 12/01/18 1214)  sodium chloride 0.9 % bolus 1,000 mL (1,000 mLs Intravenous New Bag/Given 12/01/18 1447)  iohexol (OMNIPAQUE) 300 MG/ML solution 75 mL (75 mLs Intravenous Contrast Given 12/01/18 1328)    Mobility non-ambulatory High fall risk      R Recommendations: See Admitting Provider Note  Report given to:   Additional Notes:

## 2018-12-02 ENCOUNTER — Other Ambulatory Visit: Payer: Self-pay

## 2018-12-02 ENCOUNTER — Other Ambulatory Visit: Payer: Self-pay | Admitting: Family Medicine

## 2018-12-02 ENCOUNTER — Inpatient Hospital Stay: Payer: Medicare HMO

## 2018-12-02 DIAGNOSIS — Z515 Encounter for palliative care: Secondary | ICD-10-CM

## 2018-12-02 DIAGNOSIS — L8990 Pressure ulcer of unspecified site, unspecified stage: Secondary | ICD-10-CM | POA: Insufficient documentation

## 2018-12-02 DIAGNOSIS — Z7189 Other specified counseling: Secondary | ICD-10-CM

## 2018-12-02 LAB — CBC
HCT: 20.5 % — ABNORMAL LOW (ref 39.0–52.0)
Hemoglobin: 6.3 g/dL — ABNORMAL LOW (ref 13.0–17.0)
MCH: 28 pg (ref 26.0–34.0)
MCHC: 30.7 g/dL (ref 30.0–36.0)
MCV: 91.1 fL (ref 80.0–100.0)
Platelets: 481 10*3/uL — ABNORMAL HIGH (ref 150–400)
RBC: 2.25 MIL/uL — ABNORMAL LOW (ref 4.22–5.81)
RDW: 16.6 % — ABNORMAL HIGH (ref 11.5–15.5)
WBC: 10.9 10*3/uL — ABNORMAL HIGH (ref 4.0–10.5)
nRBC: 0 % (ref 0.0–0.2)

## 2018-12-02 LAB — COMPREHENSIVE METABOLIC PANEL
ALT: 14 U/L (ref 0–44)
AST: 15 U/L (ref 15–41)
Albumin: 1.9 g/dL — ABNORMAL LOW (ref 3.5–5.0)
Alkaline Phosphatase: 68 U/L (ref 38–126)
Anion gap: 7 (ref 5–15)
BUN: 41 mg/dL — ABNORMAL HIGH (ref 8–23)
CO2: 27 mmol/L (ref 22–32)
Calcium: 9.3 mg/dL (ref 8.9–10.3)
Chloride: 101 mmol/L (ref 98–111)
Creatinine, Ser: 2.42 mg/dL — ABNORMAL HIGH (ref 0.61–1.24)
GFR calc Af Amer: 32 mL/min — ABNORMAL LOW (ref >60)
GFR calc non Af Amer: 28 mL/min — ABNORMAL LOW (ref >60)
Glucose, Bld: 75 mg/dL (ref 70–99)
Potassium: 3.3 mmol/L — ABNORMAL LOW (ref 3.5–5.1)
Sodium: 135 mmol/L (ref 135–145)
Total Bilirubin: 0.7 mg/dL (ref 0.3–1.2)
Total Protein: 5.5 g/dL — ABNORMAL LOW (ref 6.5–8.1)

## 2018-12-02 LAB — NOVEL CORONAVIRUS, NAA (HOSP ORDER, SEND-OUT TO REF LAB; TAT 18-24 HRS): SARS-CoV-2, NAA: NOT DETECTED

## 2018-12-02 LAB — URINE CULTURE: Culture: <10000 — AB

## 2018-12-02 LAB — MAGNESIUM: Magnesium: 1.8 mg/dL (ref 1.7–2.4)

## 2018-12-02 LAB — PREPARE RBC (CROSSMATCH)

## 2018-12-02 LAB — MRSA PCR SCREENING: MRSA by PCR: NEGATIVE

## 2018-12-02 LAB — VANCOMYCIN, RANDOM: Vancomycin Rm: 33

## 2018-12-02 MED ORDER — VANCOMYCIN VARIABLE DOSE PER UNSTABLE RENAL FUNCTION (PHARMACIST DOSING): Status: DC

## 2018-12-02 MED ORDER — POTASSIUM CHLORIDE CRYS ER 20 MEQ PO TBCR
40.0000 meq | EXTENDED_RELEASE_TABLET | Freq: Once | ORAL | Status: AC
  Administered 2018-12-02: 18:00:00 40 meq via ORAL
  Filled 2018-12-02: qty 2

## 2018-12-02 MED ORDER — SODIUM CHLORIDE 0.9% IV SOLUTION
Freq: Once | INTRAVENOUS | Status: AC
  Administered 2018-12-02: 18:00:00 via INTRAVENOUS

## 2018-12-02 MED ORDER — HEPARIN SODIUM (PORCINE) 5000 UNIT/ML IJ SOLN: 5000.0000 [IU] | Freq: Three times a day (TID) | INTRAMUSCULAR | Status: DC

## 2018-12-02 MED ORDER — POTASSIUM CHLORIDE 20 MEQ PO PACK
40.0000 meq | PACK | Freq: Once | ORAL | Status: AC
  Administered 2018-12-02: 06:00:00 40 meq via ORAL
  Filled 2018-12-02: qty 2

## 2018-12-02 NOTE — Progress Notes (Signed)
Pharmacy Antibiotic Note  Ruben Clark is a 63 y.o. male admitted on 12/01/2018 with bacteremia.  Pharmacy has been consulted for vancomycin dosing. Patient was here back on 08/05 for hypercalcemia of malignancy found to have MRSA bacteremia and discharged w/ OPAT abx vanc via PICC. Patient now back for AKI on CKD, and received dose of vanc 1g in the ED w/o knowing when his last dose was. Vanc random was ordered for am labs.  Plan: 08/27 @ 0500 VR 33 mcg/mL Will dose vanc per random levels for now since patient is in AKI on CKD and will wait until random < 20 mcg/mL to redose. Will recheck another random level w/ am labs. Will continue renal function and adjust doses as necessary.  Height: 5\' 8"  (172.7 cm) Weight: 159 lb 13.3 oz (72.5 kg) IBW/kg (Calculated) : 68.4  Temp (24hrs), Avg:97.3 F (36.3 C), Min:96.7 F (35.9 C), Max:97.8 F (36.6 C)  Recent Labs  Lab 12/01/18 1224 12/01/18 1434 12/02/18 0512  WBC 10.0  --  10.9*  CREATININE 3.00*  --  2.42*  LATICACIDVEN 2.7* 1.9  --   VANCORANDOM  --   --  33    Estimated Creatinine Clearance: 30.6 mL/min (A) (by C-G formula based on SCr of 2.42 mg/dL (H)).    Allergies  Allergen Reactions  . 5-Alpha Reductase Inhibitors     Thank you for allowing pharmacy to be a part of this patient's care.  Tobie Lords, PharmD, BCPS Clinical Pharmacist 12/02/2018 7:27 AM

## 2018-12-02 NOTE — Progress Notes (Signed)
Notify Dr. Verdell Carmine about patient's BP of 81/41, patient is not in distress, other VSS. Patient will receive 1 unit of PRBC and IVF is running at 100 ml/hr MD seen my message, awaiting if there are further orders to be given. RN will continue to monitor.

## 2018-12-02 NOTE — Progress Notes (Signed)
Palliative: Ruben Clark is resting quietly in bed. Nursing staff is at bedside providing care.  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  There is no family at bedside at this time.  Ruben Clark will keep his eyes closed but is able to tell me his name, and that we are in the hospital.  I share that understand he is now wanting to have stenting, he tells me that he is.  Ask him if he has considered comfort care.  He tells me that he has not.  I asked how he wants [place] to care for him.  I asked if he would want [place] to extend his life at all cost, at this point he states he would.  Call to legal guardian, Ruben Clark at [phone].  Left VM message.   Plan:   Treat the treatable, no CPR, no intubation.  At this point Ruben Clark is agreeable to continue with BL stenting, not ready for comfort care. PMT will continue reaching out to legal guardian.   64 minutes Quinn Axe, NP Palliative Medicine Team Team Phone # [phone] Greater than 50% of this time was spent counseling and coordinating care related to the above assessment and plan.

## 2018-12-02 NOTE — H&P (Addendum)
[place] [facility] [place] NAME: Ruben Clark    MR#:  [id number]  DATE OF BIRTH:  1955/06/22  DATE OF ADMISSION:  12/01/2018  PRIMARY CARE PHYSICIAN: System, Pcp Not In   REQUESTING/REFERRING PHYSICIAN: Conni Slipper, MD  CHIEF COMPLAINT:   Chief Complaint  Patient presents with  . Abnormal Lab    HISTORY OF PRESENT ILLNESS:  Ruben Clark  is a 63 y.o. Caucasian male with a known history of CHF, stage III chronic kidney disease, hypertension and known bladder squamous cell carcinoma, who presented to the ER from [facility] due to abnormal labs including low hemoglobin and hematocrits as well as elevated BUN and creatinine.  The patient was fairly somnolent but arousable during my interview.  He denied any nausea or vomiting or abdominal pain.  No fever or chills.  No chest pain or dyspnea or cough or wheezing.  No recent sick exposures.  Upon presentation to the emergency room, initial vital signs showed bradycardia with a rate of 43 and otherwise were unremarkable.  Labs revealed mild hyponatremia hypokalemia BUN of 48 with creatinine of 3 and troponin I was 26 and later 25 with a lactic acid 2.7 later 1.9 with hemoglobin of 7.4 hematocrit 23.8 comparable to 11/23/2018.  His urinalysis was strongly positive for UTI.  Urine culture and sensitivity was sent.  Abdominal and pelvic CT scan revealed interval infection of the complex bladder mass now eroding through the anterior abdominal wall with persistent marked bilateral hydronephrosis and Foley catheter that was misplaced in the prostatic urethra with Foley balloon inflated in the penile urethra.  There was new distention of small bowel loops in the left mid abdomen that could represent new partial small bowel obstruction.  The patient was given 1 g of IV cefepime, 1 mg of IV Ativan, 4 mg of IV morphine sulfate, 1 g of IV vancomycin and 1 L bolus of IV normal saline.  He will be admitted to a medical  monitored bed for further evaluation and management. PAST MEDICAL HISTORY:   Past Medical History:  Diagnosis Date  . CHF (congestive heart failure) ([place])   . Chronic kidney disease (CKD), stage III (moderate) ([facility])   . Hypertension   . Renal insufficiency     PAST SURGICAL HISTORY:   Past Surgical History:  Procedure Laterality Date  . AMPUTATION Left 08/03/2018   Procedure: AMPUTATION RAY SECOND TOE;  Surgeon: Sharlotte Alamo, DPM;  Location: [facility];  Service: Podiatry;  Laterality: Left;  . CYSTOSCOPY W/ RETROGRADES Right 11/17/2018   Procedure: CYSTOSCOPY WITH RETROGRADE PYELOGRAM;  Surgeon: Abbie Sons, MD;  Location: [facility];  Service: Urology;  Laterality: Right;  . HIP PINNING,CANNULATED Left 10/19/2018   Procedure: CANNULATED HIP PINNING;  Surgeon: Hessie Knows, MD;  Location: [facility];  Service: Orthopedics;  Laterality: Left;  . IRRIGATION AND DEBRIDEMENT FOOT Left 08/05/2018   Procedure: IRRIGATION AND DEBRIDEMENT FOOT;  Surgeon: Sharlotte Alamo, DPM;  Location: [facility];  Service: Podiatry;  Laterality: Left;  . TOE AMPUTATION    . TRANSURETHRAL RESECTION OF BLADDER TUMOR N/A 11/17/2018   Procedure: TRANSURETHRAL RESECTION OF BLADDER TUMOR (TURBT);  Surgeon: Abbie Sons, MD;  Location: [facility];  Service: Urology;  Laterality: N/A;    SOCIAL HISTORY:   Social History   Tobacco Use  . Smoking status: Current Every Day Smoker  . Smokeless tobacco: Never Used  Substance Use Topics  . Alcohol use: Never    Frequency:  Never    FAMILY HISTORY:  History reviewed. No pertinent family history.  DRUG ALLERGIES:   Allergies  Allergen Reactions  . 5-Alpha Reductase Inhibitors     REVIEW OF SYSTEMS:   ROS As per history of present illness. All pertinent systems were reviewed above. Constitutional,  HEENT, cardiovascular, respiratory, GI, GU, musculoskeletal, neuro, psychiatric, endocrine,  integumentary and hematologic systems were reviewed and are otherwise   negative/unremarkable except for positive findings mentioned above in the HPI.   MEDICATIONS AT HOME:   Prior to Admission medications   Medication Sig Start Date End Date Taking? Authorizing Provider  acetaminophen (TYLENOL) 500 MG tablet Take 500 mg by mouth every 4 (four) hours as needed.   Yes [provider]  Amino Acids-Protein Hydrolys (FEEDING SUPPLEMENT, PRO-STAT SUGAR FREE 64,) LIQD Take 30 mLs by mouth daily.   Yes [provider]  apixaban (ELIQUIS) 5 MG TABS tablet Take 1 tablet (5 mg total) by mouth 2 (two) times daily. 08/09/18  Yes Dustin Flock, MD  aspirin EC 81 MG tablet Take 81 mg by mouth daily.   Yes [provider]  bisacodyl (DULCOLAX) 5 MG EC tablet Take 1 tablet (5 mg total) by mouth daily as needed for moderate constipation. 10/21/18  Yes Demetrios Loll, MD  digoxin (LANOXIN) 0.25 MG tablet Take 0.25 mg by mouth daily.   Yes [provider]  docusate sodium (COLACE) 100 MG capsule Take 100 mg by mouth 2 (two) times daily.   Yes [provider]  doxycycline (VIBRAMYCIN) 100 MG capsule Take 100 mg by mouth 2 (two) times daily.   Yes [provider]  finasteride (PROSCAR) 5 MG tablet Take 1 tablet (5 mg total) by mouth daily. 08/09/18  Yes Dustin Flock, MD  gabapentin (NEURONTIN) 300 MG capsule Take 300 mg by mouth 2 (two) times daily.    Yes [provider]  hydrOXYzine (ATARAX/VISTARIL) 25 MG tablet Take 25 mg by mouth every 8 (eight) hours as needed for itching.    Yes [provider]  loratadine (CLARITIN) 10 MG tablet Take 10 mg by mouth daily.   Yes [provider]  LORazepam (ATIVAN) 0.5 MG tablet Take 1 tablet (0.5 mg total) by mouth daily. 11/21/18  Yes Mody, Ulice Bold, MD  metoprolol succinate (TOPROL-XL) 25 MG 24 hr tablet Take 0.5 tablets (12.5 mg total) by mouth daily. 10/10/18 12/01/18 Yes Pyreddy, Reatha Harps, MD  Multiple Vitamin (MULTIVITAMIN WITH MINERALS) TABS tablet Take 1 tablet by mouth  daily.   Yes [provider]  senna (SENOKOT) 8.6 MG TABS tablet Take 1 tablet (8.6 mg total) by mouth daily as needed for mild constipation. 10/21/18  Yes Demetrios Loll, MD  vancomycin IVPB Inject 1,250 mg into the vein every other day for 19 days. Next dose to be given 8/19 at 10am Indication:  MRSA bacteremia Last Day of Therapy:  12/13/18 Labs: CBC with diff/ CMP and vanco trough every Monday while on antibiotics. Vanco trough and BMP every Thursday while on antibiotics. PICC care including placement of biopatch. Remove PICC at completion of treatment. Fax results to Dr. Delaine Lame at [phone]. Follow up appt with Dr. Delaine Lame in 2 weeks . Call [phone] for appt.  Dose to be adjusted according to vanco trough and cr . Keep the trough between 15-20 Patient taking differently: Inject 750 mg into the vein daily.  11/24/18 12/13/18 Yes Mody, Ulice Bold, MD      VITAL SIGNS:  Blood pressure (!) 124/93, pulse 80, temperature (!) 96.7  F (35.9 C), temperature source Rectal, resp. rate 20, height 5\' 8"  (1.727 m), weight 72.5 kg, SpO2 (!) 89 %.  PHYSICAL EXAMINATION:  Physical Exam  GENERAL:  63 y.o.-year-old ill and cachectic looking Caucasian male patient lying in the bed with no acute distress.  EYES: Pupils equal, round, reactive to light and accommodation. No scleral icterus.  Positive pallor extraocular muscles intact.  HEENT: Head atraumatic, normocephalic. Oropharynx and nasopharynx clear.  NECK:  Supple, no jugular venous distention. No thyroid enlargement, no tenderness.  LUNGS: Normal breath sounds bilaterally, no wheezing, rales,rhonchi or crepitation. No use of accessory muscles of respiration.  CARDIOVASCULAR: Irregularly irregular rhythm, S1, S2 normal. No murmurs, rubs, or gallops.  ABDOMEN: Fixed, firm mass in the lower midline abdomen with superimposed ulcerated lesion approximately 10 x 4 cm, weeping clear fluid with odor suggestive of urine.   Bowel sounds diminished. No  organomegaly or mass.  EXTREMITIES: No pedal edema, cyanosis, or clubbing.  NEUROLOGIC: Grossly nonfocal.  No lateralizing signs. PSYCHIATRIC: The patient is fairly somnolent but arousable and slow to answer questions.  SKIN: As above with no other obvious rash, lesion, or ulcer.   LABORATORY PANEL:   CBC Recent Labs  Lab 12/01/18 1224  WBC 10.0  HGB 7.4*  HCT 23.8*  PLT 502*   ------------------------------------------------------------------------------------------------------------------  Chemistries  Recent Labs  Lab 12/01/18 1224  NA 133*  K 3.4*  CL 96*  CO2 28  GLUCOSE 132*  BUN 48*  CREATININE 3.00*  CALCIUM 9.9  AST 18  ALT 16  ALKPHOS 77  BILITOT 0.4   ------------------------------------------------------------------------------------------------------------------  Cardiac Enzymes No results for input(s): TROPONINI in the last 168 hours. ------------------------------------------------------------------------------------------------------------------  RADIOLOGY:  Ct Abdomen Pelvis Wo Contrast  Result Date: 12/01/2018 CLINICAL DATA:  Abdominal infection. 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 EXAM: CT ABDOMEN AND PELVIS WITHOUT CONTRAST TECHNIQUE: Multidetector CT imaging of the abdomen and pelvis was performed following the standard protocol without IV contrast. COMPARISON:  CT scan dated 11/12/2018 FINDINGS: Lower chest: New small left pleural effusion and new tiny right pleural effusion. New focal slight atelectasis in the left lower lobe. Tiny calcified granulomas in the left lower lobe. Heart size is normal. Hepatobiliary: Multiple low-density lesions in the liver consistent with benign cysts, unchanged. The largest is 6.1 cm in the caudate lobe of the liver. Biliary tree appears normal. Pancreas: Unremarkable. No pancreatic ductal dilatation or surrounding inflammatory changes. Spleen: Normal size.  Calcified granulomas in the spleen.  Adrenals/Urinary Tract: Normal adrenal glands. Persistent bilateral hydronephrosis. The ureters are dilated into the pelvis where they are compressed by a large inhomogeneous 14 x 14 x 12 cm bladder mass. The mass has cystic and solid components as well as scattered bubbles of air in the mass. The mass erodes through the midline of the anterior abdominal wall into the subcutaneous soft tissues. This most likely arises in the bladder but the adjacent seminal vesicles are not identified. There is a Foley catheter tip is in the prostatic urethra. The Foley catheter balloon is inflated within the penile urethra. Stomach/Bowel: The stomach is distended with oral contrast. There are multiple distended small bowel loops in the left mid abdomen. However contrast has passed into the nondistended colon. The bowel in the pelvis is compressed by the large mass. Vascular/Lymphatic: Aortic atherosclerosis. Reproductive: Calcifications in compressed prostate gland. Other: Anasarca. Musculoskeletal: No acute abnormality.  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 IMPRESSION: 1. Findings consistent with interval infection of the complex bladder mass, now eroding through  the anterior abdominal wall. 2. Persistent marked bilateral hydronephrosis. 3. Foley catheter tip is in the prosthetic urethra. Foley balloon is inflated in the penile urethra. 4. New distention of small bowel loops in the left mid abdomen. This could represent new partial small bowel obstruction. Electronically Signed   By: Lorriane Shire M.D.   On: 12/01/2018 14:22      IMPRESSION AND PLAN:   1.  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  The patient was already evaluated by Dr. Erlene Quan in the ER.  She recommended bilateral nephrostomy tubes and palliative care evaluation.  The patient apparently has poor insight of his cancer.  It was explained earlier to him  that the tumor will not go away as he thought and that is likely terminal and he expressed understanding then.  He was agreeable with me on palliative care consult.  We will manage his UTI for now with continued broad-spectrum antibiotic with IV cefepime and continued vancomycin which he has been getting on an outpatient basis for recent MRSA bacteremia.  We will monitor his H&H.  At this time they are stable compared to 8 days ago.    2.  Bilateral hydronephrosis.  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  It is associated with acute kidney injury.  He will need bilateral nephrostomy tubes which he refused in the past but is currently agreeable with it.  We will hold his aspirin and Eliquis pending operative procedure especially given his anemia.  The patient will be hydrated with IV normal saline and will follow BMP.  3.  Hypokalemia.  Potassium will be replaced and magnesium level will be checked.  4.  Partial small bowel obstruction.  We will keep him n.p.o. for now and repeat two-view abdomen x-ray.  5.  BPH.  We will continue his Proscar.  6.  Atrial fibrillation.  We are holding aspirin and Eliquis for now given his anemia and expected operative procedure.  We are holding off digoxin given his acute kidney injury and initial bradycardia as well.  7.  Hypertension.  We will continue Toprol-XL with holding parameters for bradycardia.  8.  DVT prophylaxis.  Subcutaneous heparin    All the records are reviewed and case discussed with ED provider. The plan of care was discussed in details with the patient (and family). I answered all questions. The patient agreed to proceed with the above mentioned plan. Further management will depend upon hospital course.   CODE STATUS: I have discussed the CODE STATUS with him and the patient is DNR/DNI.  TOTAL TIME TAKING CARE OF THIS PATIENT: 55 minutes.    Christel Mormon M.D on 12/02/2018 at 3:52 AM  Pager - [phone]   After 6pm go to [email or web address] - Proofreader  [facility]  Office  [phone]  CC: Primary care physician; System, Pcp Not In   Note: This dictation was prepared with Dragon dictation along with smaller phrase technology. Any transcriptional errors that result from this process are unintentional.

## 2018-12-02 NOTE — ED Notes (Signed)
ED TO INPATIENT HANDOFF REPORT  ED Nurse Name and Phone #:  [place] Name/Age/Gender Ruben Clark 63 y.o. male Room/Bed: ED15A/ED15A  Code Status   Code Status: Full Code  Home/SNF/Other Rehab Patient oriented to: self and place Is this baseline? unsure  Triage Complete: Triage complete  Chief Complaint low blood count  Triage Note Pt from [facility] via [facility]. Per EMS, pt sent from [facility] due to abnormal labs. Low HCT/HGB. Per Ems pt was in a group home where he fell resulting in a hip fracture and sent to [facility] for rehab. MD Malinda at bedside.    Allergies Allergies  Allergen Reactions  . 5-Alpha Reductase Inhibitors     Level of Care/Admitting Diagnosis ED Disposition    ED Disposition Condition [facility] Area: [place] [[id number]]  Level of Care: Med-Surg [16]  Covid Evaluation: Asymptomatic Screening Protocol (No Symptoms)  Diagnosis: Bilateral hydronephrosis [id number]  Admitting Physician: Christel Mormon [id number]  Attending Physician: Christel Mormon [id number]  Estimated length of stay: 3 - 4 days  Certification:: I certify this patient will need inpatient services for at least 2 midnights  PT Class (Do Not Modify): Inpatient [101]  PT Acc Code (Do Not Modify): Private [1]       B Medical/Surgery History Past Medical History:  Diagnosis Date  . CHF (congestive heart failure) ([place])   . Chronic kidney disease (CKD), stage III (moderate) ([facility])   . Hypertension   . Renal insufficiency    Past Surgical History:  Procedure Laterality Date  . AMPUTATION Left 08/03/2018   Procedure: AMPUTATION RAY SECOND TOE;  Surgeon: Sharlotte Alamo, DPM;  Location: [facility] ORS;  Service: Podiatry;  Laterality: Left;  . CYSTOSCOPY W/ RETROGRADES Right 11/17/2018   Procedure: CYSTOSCOPY WITH RETROGRADE PYELOGRAM;  Surgeon: Abbie Sons, MD;  Location: [facility] ORS;  Service: Urology;  Laterality: Right;  . HIP PINNING,CANNULATED Left  10/19/2018   Procedure: CANNULATED HIP PINNING;  Surgeon: Hessie Knows, MD;  Location: [facility] ORS;  Service: Orthopedics;  Laterality: Left;  . IRRIGATION AND DEBRIDEMENT FOOT Left 08/05/2018   Procedure: IRRIGATION AND DEBRIDEMENT FOOT;  Surgeon: Sharlotte Alamo, DPM;  Location: [facility] ORS;  Service: Podiatry;  Laterality: Left;  . TOE AMPUTATION    . TRANSURETHRAL RESECTION OF BLADDER TUMOR N/A 11/17/2018   Procedure: TRANSURETHRAL RESECTION OF BLADDER TUMOR (TURBT);  Surgeon: Abbie Sons, MD;  Location: [facility] ORS;  Service: Urology;  Laterality: N/A;     A IV Location/Drains/Wounds Patient Lines/Drains/Airways Status   Active Line/Drains/Airways    Name:   Placement date:   Placement time:   Site:   Days:   Peripheral IV 12/01/18 Left Antecubital   12/01/18    1219    Antecubital   1   PICC Single Lumen [id number] PICC Left Basilic 45 cm 0 cm   [id number]    [id number]    Basilic   123456   PICC Single Lumen [id number] PICC Right Basilic 43 cm 0 cm   99991111    123456    Basilic   10   Urethral Catheter dr Bernardo Heater Straight-tip;Triple-lumen 20 Fr.   11/17/18    1315    Straight-tip;Triple-lumen   15   Airway   11/17/18    1150     15   Incision (Closed) 08/05/18 Foot Left   08/05/18    1249     119   Incision (Closed) 11/17/18 Penis Other (Comment)  11/17/18    1239     15   Wound / Incision (Open or Dehisced) 08/02/18 Other (Comment) Foot Left   08/02/18    1537    Foot   122          Intake/Output Last 24 hours  Intake/Output Summary (Last 24 hours) at 12/02/2018 0007 Last data filed at 12/01/2018 2104 Gross per 24 hour  Intake -  Output 500 ml  Net -500 ml    Labs/Imaging Results for orders placed or performed during the hospital encounter of 12/01/18 (from the past 48 hour(s))  Comprehensive metabolic panel     Status: Abnormal   Collection Time: 12/01/18 12:24 PM  Result Value Ref Range   Sodium 133 (L) 135 - 145 mmol/L   Potassium 3.4 (L) 3.5 - 5.1 mmol/L   Chloride 96 (L) 98 - 111  mmol/L   CO2 28 22 - 32 mmol/L   Glucose, Bld 132 (H) 70 - 99 mg/dL   BUN 48 (H) 8 - 23 mg/dL   Creatinine, Ser 3.00 (H) 0.61 - 1.24 mg/dL   Calcium 9.9 8.9 - 10.3 mg/dL   Total Protein 6.5 6.5 - 8.1 g/dL   Albumin 2.2 (L) 3.5 - 5.0 g/dL   AST 18 15 - 41 U/L   ALT 16 0 - 44 U/L   Alkaline Phosphatase 77 38 - 126 U/L   Total Bilirubin 0.4 0.3 - 1.2 mg/dL   GFR calc non Af Amer 21 (L) >60 mL/min   GFR calc Af Amer 25 (L) >60 mL/min   Anion gap 9 5 - 15    Comment: Performed at [facility], [address] [place], [place] [address]  Lipase, blood     Status: None   Collection Time: 12/01/18 12:24 PM  Result Value Ref Range   Lipase 28 11 - 51 U/L    Comment: Performed at [facility], [place], [place] [address]  Troponin I (High Sensitivity)     Status: Abnormal   Collection Time: 12/01/18 12:24 PM  Result Value Ref Range   Troponin I (High Sensitivity) 26 (H) <18 ng/L    Comment: (NOTE) Elevated high sensitivity troponin I (hsTnI) values and significant  changes across serial measurements may suggest ACS but many other  chronic and acute conditions are known to elevate hsTnI results.  Refer to the "Links" section for chest pain algorithms and additional  guidance. Performed at [facility], [address] [place], [place] [address]   CBC with Differential     Status: Abnormal   Collection Time: 12/01/18 12:24 PM  Result Value Ref Range   WBC 10.0 4.0 - 10.5 K/uL   RBC 2.64 (L) 4.22 - 5.81 MIL/uL   Hemoglobin 7.4 (L) 13.0 - 17.0 g/dL   HCT 23.8 (L) 39.0 - 52.0 %   MCV 90.2 80.0 - 100.0 fL   MCH 28.0 26.0 - 34.0 pg   MCHC 31.1 30.0 - 36.0 g/dL   RDW 16.5 (H) 11.5 - 15.5 %   Platelets 502 (H) 150 - 400 K/uL   nRBC 0.0 0.0 - 0.2 %   Neutrophils Relative % 71 %   Neutro Abs 7.2 1.7 - 7.7 K/uL   Lymphocytes Relative 14 %   Lymphs Abs 1.4 0.7 - 4.0 K/uL   Monocytes Relative 10 %   Monocytes Absolute 1.0 0.1 - 1.0 K/uL    Eosinophils Relative 3 %   Eosinophils Absolute 0.3 0.0 -  0.5 K/uL   Basophils Relative 1 %   Basophils Absolute 0.1 0.0 - 0.1 K/uL   Immature Granulocytes 1 %   Abs Immature Granulocytes 0.05 0.00 - 0.07 K/uL    Comment: Performed at [facility] [place], [place] [address]  Lactic acid, plasma     Status: Abnormal   Collection Time: 12/01/18 12:24 PM  Result Value Ref Range   Lactic Acid, Venous 2.7 (HH) 0.5 - 1.9 mmol/L    Comment: CRITICAL RESULT CALLED TO, READ BACK BY AND VERIFIED WITH TIFFANY JOHNSON AT 1311 ON 12/01/2018 [place]. Performed at [facility], [address] [place], [place] [address]   Lactic acid, plasma     Status: None   Collection Time: 12/01/18  2:34 PM  Result Value Ref Range   Lactic Acid, Venous 1.9 0.5 - 1.9 mmol/L    Comment: Performed at [facility], [place], [place] [address]  Troponin I (High Sensitivity)     Status: Abnormal   Collection Time: 12/01/18  2:34 PM  Result Value Ref Range   Troponin I (High Sensitivity) 25 (H) <18 ng/L    Comment: (NOTE) Elevated high sensitivity troponin I (hsTnI) values and significant  changes across serial measurements may suggest ACS but many other  chronic and acute conditions are known to elevate hsTnI results.  Refer to the "Links" section for chest pain algorithms and additional  guidance. Performed at [facility], [address] [place], [place] [address]   Urinalysis, Complete w Microscopic     Status: Abnormal   Collection Time: 12/01/18  3:30 PM  Result Value Ref Range   Color, Urine YELLOW (A) YELLOW   APPearance TURBID (A) CLEAR   Specific Gravity, Urine 1.012 1.005 - 1.030   pH 6.0 5.0 - 8.0   Glucose, UA NEGATIVE NEGATIVE mg/dL   Hgb urine dipstick SMALL (A) NEGATIVE   Bilirubin Urine NEGATIVE NEGATIVE   Ketones, ur NEGATIVE NEGATIVE mg/dL   Protein, ur 100 (A) NEGATIVE mg/dL   Nitrite NEGATIVE NEGATIVE   Leukocytes,Ua  SMALL (A) NEGATIVE   RBC / HPF >50 (H) 0 - 5 RBC/hpf   WBC, UA >50 (H) 0 - 5 WBC/hpf   Bacteria, UA NONE SEEN NONE SEEN   Squamous Epithelial / LPF >50 (H) 0 - 5    Comment: Performed at [facility], [address], [place], [place] [address]   Ct Abdomen Pelvis Wo Contrast  Result Date: 12/01/2018 CLINICAL DATA:  Abdominal infection. 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 EXAM: CT ABDOMEN AND PELVIS WITHOUT CONTRAST TECHNIQUE: Multidetector CT imaging of the abdomen and pelvis was performed following the standard protocol without IV contrast. COMPARISON:  CT scan dated 11/12/2018 FINDINGS: Lower chest: New small left pleural effusion and new tiny right pleural effusion. New focal slight atelectasis in the left lower lobe. Tiny calcified granulomas in the left lower lobe. Heart size is normal. Hepatobiliary: Multiple low-density lesions in the liver consistent with benign cysts, unchanged. The largest is 6.1 cm in the caudate lobe of the liver. Biliary tree appears normal. Pancreas: Unremarkable. No pancreatic ductal dilatation or surrounding inflammatory changes. Spleen: Normal size.  Calcified granulomas in the spleen. Adrenals/Urinary Tract: Normal adrenal glands. Persistent bilateral hydronephrosis. The ureters are dilated into the pelvis where they are compressed by a large inhomogeneous 14 x 14 x 12 cm bladder mass. The mass has cystic and solid components as well as scattered bubbles of air  in the mass. The mass erodes through the midline of the anterior abdominal wall into the subcutaneous soft tissues. This most likely arises in the bladder but the adjacent seminal vesicles are not identified. There is a Foley catheter tip is in the prostatic urethra. The Foley catheter balloon is inflated within the penile urethra. Stomach/Bowel: The stomach is distended with oral contrast. There are multiple distended small bowel loops in the left mid abdomen. However contrast has passed into  the nondistended colon. The bowel in the pelvis is compressed by the large mass. Vascular/Lymphatic: Aortic atherosclerosis. Reproductive: Calcifications in compressed prostate gland. Other: Anasarca. Musculoskeletal: No acute abnormality.  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 IMPRESSION: 1. Findings consistent with interval infection of the complex bladder mass, now eroding through the anterior abdominal wall. 2. Persistent marked bilateral hydronephrosis. 3. Foley catheter tip is in the prosthetic urethra. Foley balloon is inflated in the penile urethra. 4. New distention of small bowel loops in the left mid abdomen. This could represent new partial small bowel obstruction. Electronically Signed   By: Lorriane Shire M.D.   On: 12/01/2018 14:22    Pending Labs Unresulted Labs (From admission, onward)    Start     Ordered   12/02/18 0500  Comprehensive metabolic panel  Tomorrow morning,   STAT     12/01/18 2352   12/02/18 0500  CBC  Tomorrow morning,   STAT     12/01/18 2352   12/01/18 1450  Urine culture  Once,   STAT     12/01/18 1449   12/01/18 1447  Novel Coronavirus, NAA (Hosp order, Send-out to [facility]; TAT 18-24 hrs  (Symptomatic/High Risk of Exposure/Tier 1 Patients Labs with Precautions)  Once,   STAT    Question Answer Comment  Is this test for diagnosis or screening Diagnosis of ill patient   Symptomatic for COVID-19 as defined by CDC Yes   Date of Symptom Onset 11/17/2018   Hospitalized for COVID-19 Yes   Admitted to ICU for COVID-19 No   Previously tested for COVID-19 Yes   Resident in a congregate (group) care setting Yes   Employed in healthcare setting No      12/01/18 1446          Vitals/Pain Today's Vitals   12/01/18 2200 12/01/18 2230 12/01/18 2300 12/02/18 0000  BP: 130/70 117/83 133/88 (!) 119/54  Pulse:    (!) 40  Resp: 12 18 13 15   Temp:      TempSrc:      SpO2:    97%  Weight:      Height:      PainSc:        Isolation Precautions No active  isolations  Medications Medications  vancomycin IVPB (has no administration in time range)  digoxin (LANOXIN) tablet 0.25 mg (has no administration in time range)  metoprolol succinate (TOPROL-XL) 24 hr tablet 12.5 mg (has no administration in time range)  hydrOXYzine (ATARAX/VISTARIL) tablet 25 mg (has no administration in time range)  LORazepam (ATIVAN) tablet 0.5 mg (has no administration in time range)  bisacodyl (DULCOLAX) EC tablet 5 mg (has no administration in time range)  docusate sodium (COLACE) capsule 100 mg (has no administration in time range)  senna (SENOKOT) tablet 8.6 mg (has no administration in time range)  finasteride (PROSCAR) tablet 5 mg (has no administration in time range)  gabapentin (NEURONTIN) capsule 300 mg (has no administration in time range)  feeding supplement (PRO-STAT SUGAR FREE 64) liquid 30 mL (  has no administration in time range)  multivitamin with minerals tablet 1 tablet (has no administration in time range)  loratadine (CLARITIN) tablet 10 mg (has no administration in time range)  enoxaparin (LOVENOX) injection 40 mg (has no administration in time range)  0.9 %  sodium chloride infusion (has no administration in time range)  acetaminophen (TYLENOL) tablet 650 mg (has no administration in time range)    Or  acetaminophen (TYLENOL) suppository 650 mg (has no administration in time range)  oxyCODONE (Oxy IR/ROXICODONE) immediate release tablet 5 mg (has no administration in time range)  traZODone (DESYREL) tablet 25 mg (has no administration in time range)  ondansetron (ZOFRAN) tablet 4 mg (has no administration in time range)    Or  ondansetron (ZOFRAN) injection 4 mg (has no administration in time range)  ceFEPIme (MAXIPIME) 1 g in sodium chloride 0.9 % 100 mL IVPB (has no administration in time range)  ceFEPIme (MAXIPIME) 1 g in sodium chloride 0.9 % 100 mL IVPB (0 g Intravenous Stopped 12/01/18 1307)  vancomycin (VANCOCIN) IVPB 1000 mg/200 mL  premix (0 mg Intravenous Stopped 12/01/18 1426)  iohexol (OMNIPAQUE) 240 MG/ML injection 50 mL (50 mLs Oral Contrast Given 12/01/18 1214)  sodium chloride 0.9 % bolus 1,000 mL (0 mLs Intravenous Stopped 12/01/18 1727)  iohexol (OMNIPAQUE) 300 MG/ML solution 75 mL (75 mLs Intravenous Contrast Given 12/01/18 1328)  LORazepam (ATIVAN) injection 1 mg (1 mg Intravenous Given 12/01/18 1526)  morphine 4 MG/ML injection 4 mg (4 mg Intravenous Given 12/01/18 1525)    Mobility walks with device High fall risk   Focused Assessments n/a   R Recommendations: See Admitting Provider Note  Report given to:   Additional Notes: n/a

## 2018-12-02 NOTE — ED Notes (Signed)
Pt given water to drink per request. 

## 2018-12-02 NOTE — Progress Notes (Signed)
Initial Nutrition Assessment  RD working remotely.  DOCUMENTATION CODES:   Not applicable  INTERVENTION:  Will monitor outcome of discussions regarding goals of care.  If diet is able to be advanced patient would benefit from Ensure Enlive po BID and Ocuvite po daily to promote wound healing.  NUTRITION DIAGNOSIS:   Increased nutrient needs related to wound healing, catabolic illness(squamous cell bladder cancer) as evidenced by estimated needs.  GOAL:   Patient will meet greater than or equal to 90% of their needs  MONITOR:   Diet advancement, PO intake, Supplement acceptance, Labs, Weight trends, Skin, I & O's  REASON FOR ASSESSMENT:   Malnutrition Screening Tool    ASSESSMENT:   63 year old male with PMHx of HTN, CHF, CKD stage III admitted with advancement of known squamous cell bladder cancer with new erosion through anterior abdominal wall, continued bilateral hydronephrosis secondary to tumor obstruction, and malpositioned Foley catheter (now replaced).   Patient unable to provide any history over the phone at this time. He is currently NPO. PMT has been consulted to help with discussion regarding goals of care in setting of poor prognosis. Will continue to monitor.  Weight appears to fluctuate in chart so difficult to trend.  Medications reviewed and include: Colace 100 mg BID, gabapentin, Ativan, MVI daily, NS at 100 mL/hr.  Labs reviewed: Potassium 3.3, BUN 41, Creatinine 2.42.  Patient is at risk for malnutrition. Unable to determine if he meets criteria without history or NFPE.  NUTRITION - FOCUSED PHYSICAL EXAM:  Unable to complete at this time.  Diet Order:   Diet Order            Diet NPO time specified  Diet effective now             EDUCATION NEEDS:   No education needs have been identified at this time  Skin:  Skin Assessment: Skin Integrity Issues:(Stg II coccyx)  Last BM:  12/02/2018 - medium type 6  Height:   Ht Readings from Last  1 Encounters:  12/01/18 5\' 8"  (1.727 m)   Weight:   Wt Readings from Last 1 Encounters:  12/01/18 72.5 kg   Ideal Body Weight:  70 kg  BMI:  Body mass index is 24.3 kg/m.  Estimated Nutritional Needs:   Kcal:  1800-2000  Protein:  90-100 grams  Fluid:  1.8-2 L/day  Willey Blade, MS, RD, LDN Office: [phone] Pager: [phone] After Hours/Weekend Pager: [phone]

## 2018-12-02 NOTE — Progress Notes (Signed)
Dr. Verdell Carmine called and say palliative care wont get a hold of patient's legal guardian, order to transfuse 1 unit, since patient is confuse and unable to get a hold of the legal guardian, blood transfusion will be emergently given since patient's hemoglobin is 6.3. also talked about his potassium being 3.3, order to replace. RN will continue to monitor,.

## 2018-12-02 NOTE — Progress Notes (Addendum)
Urology Inpatient Progress Note  Subjective: Ruben Clark is a 63 y.o. male admitted yesterday with advancement of his known squamous cell bladder cancer with new erosion through the anterior abdominal wall, continued bilateral hydronephrosis secondary to tumor obstruction, and malpositioned Foley catheter, now replaced.  No acute changes overnight.  Patient continues to deny pain.  He restated today that he is willing to proceed with bilateral PCN placement.  KUB today with unchanged, diffusely gas-filled and mildly distended bowel, consistent with ileus or obstipation.  Palliative care consult placed.  Creatinine decreased from 3.00 yesterday to 2.42 today.  White count 10.9 today.  Antibiotics as below.  Anti-infectives: Anti-infectives (From admission, onward)   Start     Dose/Rate Route Frequency Ordered Stop   12/02/18 1157  vancomycin variable dose per unstable renal function (pharmacist dosing)      Does not apply See admin instructions 12/02/18 1157     12/02/18 0100  ceFEPIme (MAXIPIME) 2 g in sodium chloride 0.9 % 100 mL IVPB  Status:  Discontinued     2 g 200 mL/hr over 30 Minutes Intravenous Daily 12/01/18 2353 12/02/18 1157   12/02/18 0000  vancomycin IVPB  Status:  Discontinued    Note to Pharmacy: Next dose to be given 8/19 at 10am Indication:  MRSA bacteremia Last Day of Therapy:  12/13/18 Labs: CBC with diff/ CMP and vanco trough every Monday while on antibiotics. Vanco trough and BMP every Thursday while on antibiotics. PICC care including placement of biopatch. Remove PICC at completion   1,250 mg Intravenous Every 48 hours 12/01/18 2352 12/02/18 0040   12/01/18 1200  ceFEPIme (MAXIPIME) 1 g in sodium chloride 0.9 % 100 mL IVPB     1 g 200 mL/hr over 30 Minutes Intravenous  Once 12/01/18 1158 12/01/18 1307   12/01/18 1200  vancomycin (VANCOCIN) IVPB 1000 mg/200 mL premix     1,000 mg 200 mL/hr over 60 Minutes Intravenous  Once 12/01/18 1158 12/01/18 1426      Current  Facility-Administered Medications  Medication Dose Route Frequency Provider Last Rate Last Dose  . 0.9 %  sodium chloride infusion   Intravenous Continuous Mansy, Jan A, MD 100 mL/hr at 12/02/18 0603    . acetaminophen (TYLENOL) tablet 650 mg  650 mg Oral Q6H PRN Mansy, Jan A, MD       Or  . acetaminophen (TYLENOL) suppository 650 mg  650 mg Rectal Q6H PRN Mansy, Jan A, MD      . bisacodyl (DULCOLAX) EC tablet 5 mg  5 mg Oral Daily PRN Mansy, Jan A, MD      . docusate sodium (COLACE) capsule 100 mg  100 mg Oral BID Mansy, Jan A, MD      . feeding supplement (PRO-STAT SUGAR FREE 64) liquid 30 mL  30 mL Oral Daily Mansy, Jan A, MD      . finasteride (PROSCAR) tablet 5 mg  5 mg Oral Daily Mansy, Jan A, MD      . gabapentin (NEURONTIN) capsule 300 mg  300 mg Oral BID Mansy, Jan A, MD      . hydrOXYzine (ATARAX/VISTARIL) tablet 25 mg  25 mg Oral Q8H PRN Mansy, Jan A, MD      . loratadine (CLARITIN) tablet 10 mg  10 mg Oral Daily Mansy, Jan A, MD      . LORazepam (ATIVAN) tablet 0.5 mg  0.5 mg Oral Daily Mansy, Jan A, MD      . metoprolol succinate (TOPROL-XL) 24 hr  tablet 12.5 mg  12.5 mg Oral Daily Mansy, Jan A, MD      . multivitamin with minerals tablet 1 tablet  1 tablet Oral Daily Mansy, Jan A, MD      . ondansetron [facility]) tablet 4 mg  4 mg Oral Q6H PRN Mansy, Jan A, MD       Or  . ondansetron [facility]) injection 4 mg  4 mg Intravenous Q6H PRN Mansy, Jan A, MD      . oxyCODONE (Oxy IR/ROXICODONE) immediate release tablet 5 mg  5 mg Oral Q4H PRN Mansy, Jan A, MD      . senna (SENOKOT) tablet 8.6 mg  1 tablet Oral Daily PRN Mansy, Jan A, MD      . traZODone (DESYREL) tablet 25 mg  25 mg Oral QHS PRN Mansy, Jan A, MD      . vancomycin variable dose per unstable renal function (pharmacist dosing)   Does not apply See admin instructions [name], RPH         Objective: Vital signs in last 24 hours: Temp:  [96.7 F (35.9 C)-97.8 F (36.6 C)] 97.7 F (36.5 C) (08/27 1039) Pulse  Rate:  [36-102] 70 (08/27 1039) Resp:  [12-24] 20 (08/27 1039) BP: (103-147)/(54-94) 114/67 (08/27 1039) SpO2:  [67 %-100 %] 100 % (08/27 1039)  Intake/Output from previous day: 08/26 0701 - 08/27 0700 In: -  Out: 950 [Urine:950] Intake/Output this shift: No intake/output data recorded.  Physical Exam Vitals signs and nursing note reviewed. Chaperone present: Accompanied by PA colleague today.  Constitutional:      General: He is sleeping. He is not in acute distress.    Appearance: He is cachectic. He is not toxic-appearing or diaphoretic.  HENT:     Head: Normocephalic and atraumatic.  Pulmonary:     Effort: Pulmonary effort is normal. No respiratory distress.  Abdominal:     General: There is no distension.     Palpations: There is mass.     Tenderness: There is no abdominal tenderness. There is no guarding or rebound.     Comments: Bandage in place over abdominal tumor erosion site.  Unable to assess.  Genitourinary:    Penis: Circumcised.      Comments: Foley catheter in place draining clear, amber urine Skin:    General: Skin is warm and dry.     Coloration: Skin is sallow.  Neurological:     Mental Status: He is easily aroused.    Lab Results:  Recent Labs    12/01/18 1224 12/02/18 0512  WBC 10.0 10.9*  HGB 7.4* 6.3*  HCT 23.8* 20.5*  PLT 502* 481*   BMET Recent Labs    12/01/18 1224 12/02/18 0512  NA 133* 135  K 3.4* 3.3*  CL 96* 101  CO2 28 27  GLUCOSE 132* 75  BUN 48* 41*  CREATININE 3.00* 2.42*  CALCIUM 9.9 9.3   Studies/Results: Dg Abd 2 Views  Result Date: 12/02/2018 CLINICAL DATA:  SBO EXAM: ABDOMEN - 2 VIEW COMPARISON:  CT abdomen pelvis, 12/01/2018 FINDINGS: Diffusely gas-filled and mildly distended small bowel and colon, similar in caliber and configuration to prior day CT. There are scattered stool and contrast balls in the colon and rectum. No free air in the abdomen. Findings are most consistent with ileus although concerning for  obstipation of the rectum by known very large bladder mass. IMPRESSION: Diffusely gas-filled and mildly distended small bowel and colon, similar in caliber and configuration to prior  day CT. There are scattered stool and contrast balls in the colon and rectum. No free air in the abdomen. Findings are most consistent with ileus although concerning for obstipation of the rectum by known very large bladder mass. Electronically Signed   By: Eddie Candle M.D.   On: 12/02/2018 09:12   I personally reviewed the imaging above and note diffuse gas-filled and distended bowels.  Assessment & Plan: Patient with unresolved bilateral hydronephrosis secondary to his known bladder cancer.  Creatinine improved since replacement of his Foley catheter yesterday.  He is a candidate for bilateral PCNs.  Palliative care consult pending.  No new recommendations today.   Debroah Loop, PA-C 12/02/2018

## 2018-12-02 NOTE — Progress Notes (Signed)
[facility] [place] NAME: Ruben Clark    MR#:  [id number]  DATE OF BIRTH:  02/02/1956  SUBJECTIVE:   Patient presented to the hospital due to altered mental status and abnormal labs and noted to be in acute kidney injury and also noted to have a infiltrated bladder mass causing bilateral hydronephrosis.  Patient is in no discomfort, creatinine stable.  Patient is refusing all care and palliative care consult is pending regarding goals of care.  REVIEW OF SYSTEMS:    Review of Systems  Constitutional: Negative for chills and fever.  HENT: Negative for congestion and tinnitus.   Eyes: Negative for blurred vision and double vision.  Respiratory: Negative for cough, shortness of breath and wheezing.   Cardiovascular: Negative for chest pain, orthopnea and PND.  Gastrointestinal: Negative for abdominal pain, diarrhea, nausea and vomiting.  Genitourinary: Negative for dysuria and hematuria.  Neurological: Positive for weakness (generalized weakness. ). Negative for dizziness, sensory change and focal weakness.  All other systems reviewed and are negative.   Nutrition: NPO Tolerating Diet: No Tolerating PT: Await Eval.   DRUG ALLERGIES:   Allergies  Allergen Reactions  . 5-Alpha Reductase Inhibitors     VITALS:  Blood pressure 114/67, pulse 70, temperature 97.7 F (36.5 C), temperature source Oral, resp. rate 20, height 5\' 8"  (1.727 m), weight 72.5 kg, SpO2 100 %.  PHYSICAL EXAMINATION:   Physical Exam  GENERAL:  63 y.o.-year-old thin cachectic patient lying in bed in no acute distress.  EYES: Pupils equal, round, reactive to light and accommodation. No scleral icterus. Extraocular muscles intact.  HEENT: Head atraumatic, normocephalic. Oropharynx and nasopharynx clear.  NECK:  Supple, no jugular venous distention. No thyroid enlargement, no tenderness.  LUNGS: Normal breath sounds bilaterally, no wheezing, rales, rhonchi. No use  of accessory muscles of respiration.  CARDIOVASCULAR: S1, S2 normal. No murmurs, rubs, or gallops.  ABDOMEN: Soft, nontender, nondistended. Bowel sounds present. No organomegaly or mass. Lower abdomen mass near the bladder protruding through the skin suspected to be the necrotic bladder cancer. EXTREMITIES: No cyanosis, clubbing or edema b/l.    NEUROLOGIC: Cranial nerves II through XII are intact. No focal Motor or sensory deficits b/l.   PSYCHIATRIC: The patient is alert and oriented x 3.  SKIN: No obvious rash, lesion, or ulcer.   Status post Foley catheter placement with yellow urine draining.   LABORATORY PANEL:   CBC Recent Labs  Lab 12/02/18 0512  WBC 10.9*  HGB 6.3*  HCT 20.5*  PLT 481*   ------------------------------------------------------------------------------------------------------------------  Chemistries  Recent Labs  Lab 12/02/18 0512  NA 135  K 3.3*  CL 101  CO2 27  GLUCOSE 75  BUN 41*  CREATININE 2.42*  CALCIUM 9.3  MG 1.8  AST 15  ALT 14  ALKPHOS 68  BILITOT 0.7   ------------------------------------------------------------------------------------------------------------------  Cardiac Enzymes No results for input(s): TROPONINI in the last 168 hours. ------------------------------------------------------------------------------------------------------------------  RADIOLOGY:  Ct Abdomen Pelvis Wo Contrast  Result Date: 12/01/2018 CLINICAL DATA:  Abdominal infection. 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 EXAM: CT ABDOMEN AND PELVIS WITHOUT CONTRAST TECHNIQUE: Multidetector CT imaging of the abdomen and pelvis was performed following the standard protocol without IV contrast. COMPARISON:  CT scan dated 11/12/2018 FINDINGS: Lower chest: New small left pleural effusion and new tiny right pleural effusion. New focal slight atelectasis in the left lower lobe. Tiny calcified granulomas in the left lower lobe. Heart size is normal.  Hepatobiliary:  Multiple low-density lesions in the liver consistent with benign cysts, unchanged. The largest is 6.1 cm in the caudate lobe of the liver. Biliary tree appears normal. Pancreas: Unremarkable. No pancreatic ductal dilatation or surrounding inflammatory changes. Spleen: Normal size.  Calcified granulomas in the spleen. Adrenals/Urinary Tract: Normal adrenal glands. Persistent bilateral hydronephrosis. The ureters are dilated into the pelvis where they are compressed by a large inhomogeneous 14 x 14 x 12 cm bladder mass. The mass has cystic and solid components as well as scattered bubbles of air in the mass. The mass erodes through the midline of the anterior abdominal wall into the subcutaneous soft tissues. This most likely arises in the bladder but the adjacent seminal vesicles are not identified. There is a Foley catheter tip is in the prostatic urethra. The Foley catheter balloon is inflated within the penile urethra. Stomach/Bowel: The stomach is distended with oral contrast. There are multiple distended small bowel loops in the left mid abdomen. However contrast has passed into the nondistended colon. The bowel in the pelvis is compressed by the large mass. Vascular/Lymphatic: Aortic atherosclerosis. Reproductive: Calcifications in compressed prostate gland. Other: Anasarca. Musculoskeletal: No acute abnormality.  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 IMPRESSION: 1. Findings consistent with interval infection of the complex bladder mass, now eroding through the anterior abdominal wall. 2. Persistent marked bilateral hydronephrosis. 3. Foley catheter tip is in the prosthetic urethra. Foley balloon is inflated in the penile urethra. 4. New distention of small bowel loops in the left mid abdomen. This could represent new partial small bowel obstruction. Electronically Signed   By: Lorriane Shire M.D.   On: 12/01/2018 14:22   Dg Abd 2 Views  Result Date: 12/02/2018 CLINICAL DATA:  SBO EXAM: ABDOMEN - 2 VIEW  COMPARISON:  CT abdomen pelvis, 12/01/2018 FINDINGS: Diffusely gas-filled and mildly distended small bowel and colon, similar in caliber and configuration to prior day CT. There are scattered stool and contrast balls in the colon and rectum. No free air in the abdomen. Findings are most consistent with ileus although concerning for obstipation of the rectum by known very large bladder mass. IMPRESSION: Diffusely gas-filled and mildly distended small bowel and colon, similar in caliber and configuration to prior day CT. There are scattered stool and contrast balls in the colon and rectum. No free air in the abdomen. Findings are most consistent with ileus although concerning for obstipation of the rectum by known very large bladder mass. Electronically Signed   By: Eddie Candle M.D.   On: 12/02/2018 09:12     ASSESSMENT AND PLAN:   1.  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  - seen by Urology Dr. Erlene Quan and she recommends bilateral nephrostomy tubes but patient has a poor prognosis and therefore awaiting palliative care input regarding goals of care. - Creatinine has improved since yesterday, continue supportive care with IV fluids, IV antibiotics with vancomycin, cefepime for now.  2.  Bilateral hydronephrosis.  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  It is associated with acute kidney injury.  He will need bilateral nephrostomy tubes which he refused in the past and still refusing care.  - await Palliative Care consult in discussing goals of care.  - cont. To hold ASA, Eliquis in case he changes his mind and after discussion with POA.   3.  Hypokalemia. - cont. To replace and will repeat in a.m.  -  will check Mg. In a.m.   4.  Partial small bowel obstruction.  -X-ray showing no evidence of obstruction. No acute surgical indication.  5.  BPH -  cont. Proscar.  - s/p Foley.   6.  Atrial fibrillation.    Continue Toprol, hold digoxin and rate controlling meds. -Aspirin and Eliquis on hold due to possible need for surgical intervention.  7.  Hypertension.    Continue Toprol  Patient has multiple comorbidities and a very poor prognosis.  And awaiting palliative care discussion with patient's POA regarding goals of care.  Patient is a DNR   All the records are reviewed and case discussed with Care Management/Social Worker. Management plans discussed with the patient, family and they are in agreement.  CODE STATUS: DNR  DVT Prophylaxis: Ted's & SCD's  TOTAL TIME TAKING CARE OF THIS PATIENT: 30 minutes.   POSSIBLE D/C IN 2-3 DAYS, DEPENDING ON CLINICAL CONDITION.   Henreitta Leber M.D on 12/02/2018 at 2:58 PM  Between 7am to 6pm - Pager - [phone]  After 6pm go to [email or web address] - Proofreader  [facility]  Office  [phone]  CC: Primary care physician; System, Pcp Not In

## 2018-12-02 NOTE — Progress Notes (Addendum)
Pharmacy Antibiotic Note  Ruben Clark is a 63 y.o. male admitted on 12/01/2018 with bacteremia.  Pharmacy has been consulted for Vancomycin dosing. Patient was here back on 08/05 for hypercalcemia of malignancy found to have MRSA bacteremia and discharged w/ OPAT abx vanc via PICC. Patient now back for AKI on CKD, and received dose of vanc 1g in the ED w/o knowing when his last dose was.  Per Nursing MAR, patient was taking Vancomycin 750 mg QD. However, per ID MD's last note on 11/23/2018 patient was supposed to be on Vancomycin 1250 mg IVPB every 48 hrs as a result of Vancomycin level of 25 and Scr 1.4 on 11/23/2018.   Vanc Random: 33 (12/02/18 @ [id number])  Plan: 1. Will HOLD vancomycin for today and follow Vanc Random with AM labs.    Height: 5\' 8"  (172.7 cm) Weight: 159 lb 13.3 oz (72.5 kg) IBW/kg (Calculated) : 68.4  Temp (24hrs), Avg:97.3 F (36.3 C), Min:96.7 F (35.9 C), Max:97.8 F (36.6 C)  Recent Labs  Lab 12/01/18 1224 12/01/18 1434 12/02/18 0512  WBC 10.0  --  10.9*  CREATININE 3.00*  --  2.42*  LATICACIDVEN 2.7* 1.9  --   VANCORANDOM  --   --  33    Estimated Creatinine Clearance: 30.6 mL/min (A) (by C-G formula based on SCr of 2.42 mg/dL (H)).    Allergies  Allergen Reactions  . 5-Alpha Reductase Inhibitors     Thank you for allowing pharmacy to be a part of this patient's care.  Kristeen Miss, PharmD Clinical Pharmacist 12/02/2018 7:58 AM

## 2018-12-03 DIAGNOSIS — Z7189 Other specified counseling: Secondary | ICD-10-CM

## 2018-12-03 LAB — CREATININE, SERUM
Creatinine, Ser: 2.19 mg/dL — ABNORMAL HIGH (ref 0.61–1.24)
GFR calc Af Amer: 36 mL/min — ABNORMAL LOW (ref >60)
GFR calc non Af Amer: 31 mL/min — ABNORMAL LOW (ref >60)

## 2018-12-03 LAB — BPAM RBC
Blood Product Expiration Date: 202009222359
ISSUE DATE / TIME: 202008271759
Unit Type and Rh: 6200

## 2018-12-03 LAB — CBC
HCT: 23.1 % — ABNORMAL LOW (ref 39.0–52.0)
Hemoglobin: 7 g/dL — ABNORMAL LOW (ref 13.0–17.0)
MCH: 28.1 pg (ref 26.0–34.0)
MCHC: 30.3 g/dL (ref 30.0–36.0)
MCV: 92.8 fL (ref 80.0–100.0)
Platelets: 435 10*3/uL — ABNORMAL HIGH (ref 150–400)
RBC: 2.49 MIL/uL — ABNORMAL LOW (ref 4.22–5.81)
RDW: 16.9 % — ABNORMAL HIGH (ref 11.5–15.5)
WBC: 9.5 10*3/uL (ref 4.0–10.5)
nRBC: 0 % (ref 0.0–0.2)

## 2018-12-03 LAB — TYPE AND SCREEN
ABO/RH(D): A POS
Antibody Screen: NEGATIVE
Unit division: 0

## 2018-12-03 LAB — VANCOMYCIN, RANDOM: Vancomycin Rm: 22

## 2018-12-03 MED ORDER — OCUVITE-LUTEIN PO CAPS
1.0000 | ORAL_CAPSULE | Freq: Every day | ORAL | Status: DC
  Filled 2018-12-03 (×3): qty 1

## 2018-12-03 MED ORDER — ENSURE ENLIVE PO LIQD
237.0000 mL | Freq: Two times a day (BID) | ORAL | Status: DC
  Administered 2018-12-04 – 2018-12-06 (×2): 237 mL via ORAL

## 2018-12-03 MED ORDER — FENTANYL CITRATE (PF) 100 MCG/2ML IJ SOLN
12.5000 ug | INTRAMUSCULAR | Status: DC | PRN
  Administered 2018-12-03 (×2): 50 ug via INTRAVENOUS
  Administered 2018-12-03: 12:00:00 25 ug via INTRAVENOUS
  Administered 2018-12-04 (×3): 100 ug via INTRAVENOUS
  Filled 2018-12-03 (×7): qty 2

## 2018-12-03 MED ORDER — POLYVINYL ALCOHOL 1.4 % OP SOLN
2.0000 [drp] | OPHTHALMIC | Status: DC | PRN
  Filled 2018-12-03: qty 15

## 2018-12-03 MED ORDER — ATROPINE SULFATE 1 % OP SOLN
2.0000 [drp] | Freq: Four times a day (QID) | OPHTHALMIC | Status: DC | PRN
  Filled 2018-12-03: qty 2

## 2018-12-03 NOTE — Telephone Encounter (Signed)
TURBT was the only surgery he needed which has been performed.  No other surgeries were planned at this time

## 2018-12-03 NOTE — Progress Notes (Addendum)
New referral for [facility] home received from [place] following a Palliative medicine consult. Patient is a 63 year old man with a known history of bladder cancer admitted to [facility] on 8/26 from [facility] for evaluation of abnormal labs which included low Hb and elevated BUN, creatinine and hypokalemia. He was also found to be in A fib.  Patient has a known history of invasive squamous cell carcinoma of the bladder. Abdominal CT on 8/26 showed small bowel obstruction and findings consistent with and infection of the bladder mass, which is now eroding through the abdominal  Wall. Urology and Palliative medicine have been consulted. Patient has a legal guardian and following conversations with Palliative Medinicine NP Quinn Axe has chosen to focus on comfort with a requested transpfer to the hospice home. Hospital care team aware there is currently no beds available for transfer. CMRN Colletta [place] has advised guardian of same. Patient placed on the hospice home wait list. Patient seen lying in bed, alert, oriented to self and situation, reports he came to the hospital  from [place] and is requesting to eat. He denied pain or discomfort. Foley catheter in place.  Comfort feeds to be ordered by attending physician. Patient information faxed to referral. Flo Shanks BSN, RN, [facility] [phone]

## 2018-12-03 NOTE — Progress Notes (Addendum)
Palliative: Ruben Clark is resting quietly in bed.  He will make but not keep eye contact.  He has a legal guardian, and is therefore non-decisional.  He is able to make his basic needs known, but not understand the complexity of medical decision making.  I encouraged him that we are caring for him and want to make sure that he is comfortable.  Call received from legal guardian, Ruben Clark.  We talk in detail about Ruben Clark's acute and chronic health concerns.  I share details of CT, urinary fistula.  We talked about what is next, how to care for Ruben Clark.  We talked about what we can and cannot change for him.  After discussion, keeping Ruben Clark at the center of decision-making, legal guardian elects comfort and dignity at end-of-life, let nature take its course.  He is requesting hospice care in [facility] hospice time.  Comfort orders made, goldenrod form (DNR) completed and placed on chart.  Conference with bedside nursing staff, case management, hospice liaison related to legal guardian request for comfort measures only, patient condition, needs, disposition plan.  This note does not cover the depth and breath of discussions with responsible party and others.  Plan: Full comfort care, comfort and dignity at end-of-life, let nature take its course.  [facility] hosp[facility].  65 minutes, extended time Ruben Axe, NP Palliative Medicine Team Team Phone # [phone] Greater than 50% of this time was spent counseling and coordinating care related to the above assessment and plan.

## 2018-12-03 NOTE — Care Management Important Message (Signed)
Important Message  Patient Details  Name: Ruben Clark MRN: [id number] Date of Birth: [birth year]   Medicare Important Message Given:  Yes  Initial Medicare IM given by Patient Access Associate on 12/02/2018 at 10:53am.  Still valid.    Dannette Barbara 12/03/2018, 8:21 AM

## 2018-12-03 NOTE — Progress Notes (Signed)
Pt is refusing to keep tele box on. Per MD okay to Dc order and also place order for CBC.

## 2018-12-03 NOTE — Progress Notes (Signed)
[place] [facility] [place] NAME: Ruben Clark    MR#:  [id number]  DATE OF BIRTH:  April 22, 1955  SUBJECTIVE:   Patient presented to the hospital due to altered mental status and abnormal labs and noted to be in acute kidney injury and also noted to have a infiltrated bladder mass causing bilateral hydronephrosis.  Patient denies any significant pain or discomfort.  Palliative care had discussion with the patient's POA and they have now agreed to comfort care.  REVIEW OF SYSTEMS:    Review of Systems  Constitutional: Negative for chills and fever.  HENT: Negative for congestion and tinnitus.   Eyes: Negative for blurred vision and double vision.  Respiratory: Negative for cough, shortness of breath and wheezing.   Cardiovascular: Negative for chest pain, orthopnea and PND.  Gastrointestinal: Negative for abdominal pain, diarrhea, nausea and vomiting.  Genitourinary: Negative for dysuria and hematuria.  Neurological: Positive for weakness (generalized weakness. ). Negative for dizziness, sensory change and focal weakness.  All other systems reviewed and are negative.   Nutrition: Regular Tolerating Diet: Yes Tolerating PT: Await Eval.   DRUG ALLERGIES:   Allergies  Allergen Reactions  . 5-Alpha Reductase Inhibitors     VITALS:  Blood pressure 114/60, pulse 66, temperature 97.6 F (36.4 C), temperature source Oral, resp. rate 20, height 5\' 8"  (1.727 m), weight 72.5 kg, SpO2 100 %.  PHYSICAL EXAMINATION:   Physical Exam  GENERAL:  63 y.o.-year-old thin cachectic patient lying in bed in no acute distress.  EYES: Pupils equal, round, reactive to light and accommodation. No scleral icterus. Extraocular muscles intact.  HEENT: Head atraumatic, normocephalic. Oropharynx and nasopharynx clear.  NECK:  Supple, no jugular venous distention. No thyroid enlargement, no tenderness.  LUNGS: Normal breath sounds bilaterally, no wheezing, rales,  rhonchi. No use of accessory muscles of respiration.  CARDIOVASCULAR: S1, S2 normal. No murmurs, rubs, or gallops.  ABDOMEN: Soft, nontender, nondistended. Bowel sounds present. No organomegaly or mass. Lower abdomen mass near the bladder protruding through the skin suspected to be the necrotic bladder cancer. EXTREMITIES: No cyanosis, clubbing or edema b/l.    NEUROLOGIC: Cranial nerves II through XII are intact. No focal Motor or sensory deficits b/l. Globally weak   PSYCHIATRIC: The patient is alert and oriented x 1.  SKIN: No obvious rash, lesion, or ulcer.   Status post Foley catheter placement with yellow urine draining.   LABORATORY PANEL:   CBC Recent Labs  Lab 12/03/18 0651  WBC 9.5  HGB 7.0*  HCT 23.1*  PLT 435*   ------------------------------------------------------------------------------------------------------------------  Chemistries  Recent Labs  Lab 12/02/18 0512 12/03/18 0651  NA 135  --   K 3.3*  --   CL 101  --   CO2 27  --   GLUCOSE 75  --   BUN 41*  --   CREATININE 2.42* 2.19*  CALCIUM 9.3  --   MG 1.8  --   AST 15  --   ALT 14  --   ALKPHOS 68  --   BILITOT 0.7  --    ------------------------------------------------------------------------------------------------------------------  Cardiac Enzymes No results for input(s): TROPONINI in the last 168 hours. ------------------------------------------------------------------------------------------------------------------  RADIOLOGY:  Dg Abd 2 Views  Result Date: 12/02/2018 CLINICAL DATA:  SBO EXAM: ABDOMEN - 2 VIEW COMPARISON:  CT abdomen pelvis, 12/01/2018 FINDINGS: Diffusely gas-filled and mildly distended small bowel and colon, similar in caliber and configuration to prior day CT. There are scattered stool and  contrast balls in the colon and rectum. No free air in the abdomen. Findings are most consistent with ileus although concerning for obstipation of the rectum by known very large  bladder mass. IMPRESSION: Diffusely gas-filled and mildly distended small bowel and colon, similar in caliber and configuration to prior day CT. There are scattered stool and contrast balls in the colon and rectum. No free air in the abdomen. Findings are most consistent with ileus although concerning for obstipation of the rectum by known very large bladder mass. Electronically Signed   By: Eddie Candle M.D.   On: 12/02/2018 09:12     ASSESSMENT AND PLAN:   1.  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  - seen by Urology Dr. Erlene Quan and she recommends bilateral nephrostomy tubes but patient has a poor prognosis and now [place].. - will d/c abx today.  2.  Bilateral hydronephrosis.  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  It is associated with acute kidney injury.  He will need bilateral nephrostomy tubes But now is comfort care only.  3.  Hypokalemia.  4.  Partial small bowel obstruction.  -X-ray showing no evidence of obstruction. 5.  BPH -  - s/p Foley.  6.  Atrial fibrillation.     7.  Hypertension.       Patient has multiple comorbidities and a very poor prognosis.  Palliative care consult obtained.  They spoke to the patient's POA and patient is now comfort care only. -Continue PRN fentanyl, Ativan, atropine drops.  All the records are reviewed and case discussed with Care Management/Social Worker. Management plans discussed with the patient, family and they are in agreement.  CODE STATUS: DNR  DVT Prophylaxis: Ted's & SCD's  TOTAL TIME TAKING CARE OF THIS PATIENT: 30 minutes.   POSSIBLE D/C IN 2-3 DAYS, DEPENDING ON CLINICAL CONDITION.   Henreitta Leber M.D on 12/03/2018 at 4:36 PM  Between 7am to 6pm - Pager - [phone]  After 6pm go to [email or web address] - Proofreader  [facility]   Office  [phone]  CC: Primary care physician; System, Pcp Not In

## 2018-12-03 NOTE — TOC Initial Note (Addendum)
Transition of Care [facility]) - Initial/Assessment Note    Patient Details  Name: Ruben Clark MRN: [id number] Date of Birth: Feb 02, 1956  [facility] CM/SW Contact:    Beverly Sessions, RN Phone Number: 12/03/2018, 2:51 PM  Clinical Narrative:                 Patient admitted with bilateral hydronephrosis.  Patient from [facility] [place] has spoken with guardian and is reported they would like to transition to residential hospice in [place].   RNCM has left message with Guardian to confirm.    Referral made to Santiago Glad with Psychologist, occupational for residential hospice  UPDATE: RNCM spoke with Guardian, states he does not have a preference of Residential hospice.  - Referral made to [place] [facility] [place] with Museum/gallery conservator at [facility] of [place] they have 1 bed available, but guardian would have to be in agreement to may $225 a day.  Message left for [facility] [place] [facility] [place] left, clinical faxed to [phone]          Patient Goals and CMS Choice        Expected Discharge Plan and Services                                                Prior Living Arrangements/Services                       Activities of Daily Living Home Assistive Devices/Equipment: Gilford Rile (specify type) ADL Screening (condition at time of admission) Patient's cognitive ability adequate to safely complete daily activities?: No Is the patient deaf or have difficulty hearing?: No Does the patient have difficulty seeing, even when wearing glasses/contacts?: No Does the patient have difficulty concentrating, remembering, or making decisions?: No Patient able to express need for assistance with ADLs?: Yes Does the patient have difficulty dressing or bathing?: Yes Independently performs ADLs?: No Does the patient have difficulty  walking or climbing stairs?: Yes Weakness of Legs: Both Weakness of Arms/Hands: None  Permission Sought/Granted                  Emotional Assessment              Admission diagnosis:  Intra-abdominal infection [B99.9] Hydronephrosis, unspecified hydronephrosis type [N13.30] Patient Active Problem List   Diagnosis Date Noted  . Pressure injury of skin 12/02/2018  . Goals of care, counseling/discussion   . Palliative care by specialist   . Bilateral hydronephrosis 12/01/2018  . Hypercalcemia 11/10/2018  . Closed left hip fracture ([place]) 10/19/2018  . Chronic systolic CHF (congestive heart failure) ([place]) 10/19/2018  . HTN (hypertension) 10/19/2018  . Sepsis ([place]) 10/07/2018  . COPD (chronic obstructive pulmonary disease) ([place]) 08/26/2018  . Atrial fibrillation ([place]) 08/26/2018  . Left foot infection 08/02/2018   PCP:  System, Pcp Not In Pharmacy:   [facility], [place] [place] [address] Phone: [phone] Fax: [phone]     Social Determinants of Health (SDOH) Interventions    Readmission Risk Interventions Readmission Risk Prevention Plan 12/03/2018 10/10/2018  Post Dischage Appt - Complete  Medication Screening - Complete  Transportation Screening  Complete Complete  Palliative Care Screening Complete -  Medication Review (RN Care Manager) Complete -

## 2018-12-03 NOTE — Progress Notes (Signed)
Pharmacy Antibiotic Note  Ruben Clark is a 63 y.o. male admitted on 12/01/2018 with bacteremia.  Pharmacy has been consulted for Vancomycin dosing. Patient was here back on 08/05 for hypercalcemia of malignancy found to have MRSA bacteremia and discharged w/ OPAT abx vanc via PICC. Patient now back for AKI on CKD, and received dose of vanc 1g in the ED (on 8/26 @1307 ) w/o knowing when his last dose was.  Per Nursing MAR, patient was taking Vancomycin 750 mg QD. However, per ID MD's last note on 11/23/2018 patient was supposed to be on Vancomycin 1250 mg IVPB every 48 hrs as a result of Vancomycin level of 25 and Scr 1.4 on 11/23/2018.   Vanc Random: 33 (12/02/18 @ [id number])  Vanc Random: 22 (12/03/18 @ 0651) - this level is reflective of ~ 37 hours.   Scr 3>> 2.42 >> 2.19   Plan: 1. Will recheck VR @ 1300 for ~ 48 hours of holding vancomycin. If level < 22, will likely order Vancomycin 750 mg x1 dose.    Height: 5\' 8"  (172.7 cm) Weight: 159 lb 13.3 oz (72.5 kg) IBW/kg (Calculated) : 68.4  Temp (24hrs), Avg:97.9 F (36.6 C), Min:97.5 F (36.4 C), Max:98.7 F (37.1 C)  Recent Labs  Lab 12/01/18 1224 12/01/18 1434 12/02/18 0512  WBC 10.0  --  10.9*  CREATININE 3.00*  --  2.42*  LATICACIDVEN 2.7* 1.9  --   VANCORANDOM  --   --  33    Estimated Creatinine Clearance: 30.6 mL/min (A) (by C-G formula based on SCr of 2.42 mg/dL (H)).    Allergies  Allergen Reactions  . 5-Alpha Reductase Inhibitors     Thank you for allowing pharmacy to be a part of this patient's care.  Kristeen Miss, PharmD Clinical Pharmacist 12/03/2018 7:31 AM

## 2018-12-03 NOTE — Progress Notes (Addendum)
Urology Inpatient Progress Note  Subjective: Ruben Clark is a 63 y.o. male admitted 2 days ago with advanced squamous cell bladder cancer, bilateral hydronephrosis, malpositioned Foley now replaced, and anemia.  Patient received 1 unit PRBCs yesterday.  Creatinine 2.19 today, downtrending from admission value of 3.00.  He remains afebrile with labile blood pressures.  Palliative care consult completed yesterday, with guidance to "treat the treatable," however with no CPR or intubation.  He is refusing comfort care at this point.  Patient is awake and alert today, however oriented only to person.  He continues to deny pain.  He reiterated his willingness to undergo bilateral PCN placement during our conversation, however he did not remember seeing palliative care yesterday.  Per nursing, urinary output has decreased significantly over the past 24 hours.  No measured output charted.  There was 5 mL of clear yellow urine in his Foley this morning.  Per nursing, it had not been emptied since last night.  Anti-infectives: Anti-infectives (From admission, onward)   Start     Dose/Rate Route Frequency Ordered Stop   12/02/18 1157  vancomycin variable dose per unstable renal function (pharmacist dosing)      Does not apply See admin instructions 12/02/18 1157     12/02/18 0100  ceFEPIme (MAXIPIME) 2 g in sodium chloride 0.9 % 100 mL IVPB  Status:  Discontinued     2 g 200 mL/hr over 30 Minutes Intravenous Daily 12/01/18 2353 12/02/18 1157   12/02/18 0000  vancomycin IVPB  Status:  Discontinued    Note to Pharmacy: Next dose to be given 8/19 at 10am Indication:  MRSA bacteremia Last Day of Therapy:  12/13/18 Labs: CBC with diff/ CMP and vanco trough every Monday while on antibiotics. Vanco trough and BMP every Thursday while on antibiotics. PICC care including placement of biopatch. Remove PICC at completion   1,250 mg Intravenous Every 48 hours 12/01/18 2352 12/02/18 0040   12/01/18 1200  ceFEPIme  (MAXIPIME) 1 g in sodium chloride 0.9 % 100 mL IVPB     1 g 200 mL/hr over 30 Minutes Intravenous  Once 12/01/18 1158 12/01/18 1307   12/01/18 1200  vancomycin (VANCOCIN) IVPB 1000 mg/200 mL premix     1,000 mg 200 mL/hr over 60 Minutes Intravenous  Once 12/01/18 1158 12/01/18 1426      Current Facility-Administered Medications  Medication Dose Route Frequency Provider Last Rate Last Dose  . 0.9 %  sodium chloride infusion   Intravenous Continuous Mansy, Jan A, MD 100 mL/hr at 12/03/18 0532    . acetaminophen (TYLENOL) tablet 650 mg  650 mg Oral Q6H PRN Mansy, Jan A, MD       Or  . acetaminophen (TYLENOL) suppository 650 mg  650 mg Rectal Q6H PRN Mansy, Jan A, MD      . bisacodyl (DULCOLAX) EC tablet 5 mg  5 mg Oral Daily PRN Mansy, Jan A, MD      . docusate sodium (COLACE) capsule 100 mg  100 mg Oral BID Mansy, Jan A, MD      . feeding supplement (PRO-STAT SUGAR FREE 64) liquid 30 mL  30 mL Oral Daily Mansy, Jan A, MD      . finasteride (PROSCAR) tablet 5 mg  5 mg Oral Daily Mansy, Jan A, MD   5 mg at 12/03/18 [id number]  . gabapentin (NEURONTIN) capsule 300 mg  300 mg Oral BID Mansy, Jan A, MD   300 mg at 12/03/18 [id number]  . hydrOXYzine (ATARAX/VISTARIL) tablet  25 mg  25 mg Oral Q8H PRN Mansy, Jan A, MD      . loratadine (CLARITIN) tablet 10 mg  10 mg Oral Daily Mansy, Jan A, MD      . LORazepam (ATIVAN) tablet 0.5 mg  0.5 mg Oral Daily Mansy, Jan A, MD   0.5 mg at 12/03/18 0926  . metoprolol succinate (TOPROL-XL) 24 hr tablet 12.5 mg  12.5 mg Oral Daily Mansy, Jan A, MD   12.5 mg at 12/03/18 0926  . multivitamin with minerals tablet 1 tablet  1 tablet Oral Daily Mansy, Jan A, MD      . ondansetron [facility]) tablet 4 mg  4 mg Oral Q6H PRN Mansy, Jan A, MD       Or  . ondansetron [facility]) injection 4 mg  4 mg Intravenous Q6H PRN Mansy, Jan A, MD      . oxyCODONE (Oxy IR/ROXICODONE) immediate release tablet 5 mg  5 mg Oral Q4H PRN Mansy, Jan A, MD   5 mg at 12/03/18 0540  . senna (SENOKOT) tablet  8.6 mg  1 tablet Oral Daily PRN Mansy, Jan A, MD      . traZODone (DESYREL) tablet 25 mg  25 mg Oral QHS PRN Mansy, Jan A, MD      . vancomycin variable dose per unstable renal function (pharmacist dosing)   Does not apply See admin instructions [name], RPH         Objective: Vital signs in last 24 hours: Temp:  [97.5 F (36.4 C)-98.7 F (37.1 C)] 97.6 F (36.4 C) (08/28 0519) Pulse Rate:  [66-91] 66 (08/28 0519) Resp:  [18-20] 20 (08/28 0519) BP: (81-131)/(41-110) 114/60 (08/28 0519) SpO2:  [90 %-100 %] 100 % (08/28 0519)  Intake/Output from previous day: 08/27 0701 - 08/28 0700 In: 1050.5 [I.V.:760.5; Blood:290] Out: -  Intake/Output this shift: No intake/output data recorded.  Physical Exam Vitals signs and nursing note reviewed.  Constitutional:      General: He is awake.     Appearance: He is cachectic.  HENT:     Head: Normocephalic and atraumatic.  Abdominal:     General: Abdomen is flat. There is no distension.     Palpations: There is mass.     Tenderness: There is no abdominal tenderness. There is no guarding or rebound.  Genitourinary:    Penis: Circumcised.      Comments: Foley catheter in place draining clear yellow urine, crusted blood around the urethral meatus. Skin:    General: Skin is warm and dry.  Neurological:     Mental Status: He is alert. He is disoriented.    Lab Results:  Recent Labs    12/01/18 1224 12/02/18 0512  WBC 10.0 10.9*  HGB 7.4* 6.3*  HCT 23.8* 20.5*  PLT 502* 481*   BMET Recent Labs    12/01/18 1224 12/02/18 0512 12/03/18 0651  NA 133* 135  --   K 3.4* 3.3*  --   CL 96* 101  --   CO2 28 27  --   GLUCOSE 132* 75  --   BUN 48* 41*  --   CREATININE 3.00* 2.42* 2.19*  CALCIUM 9.9 9.3  --    Assessment & Plan: Patient with possible decreased urinary output, however unable to confirm given absence of measured output volume in chart.  Change in mental status today with new disorientation, however patient is  more alert than yesterday.  Creatinine continues to downtrend since replacement of Foley  catheter.  Based on recommendations from palliative care consult yesterday and patient's continued willingness to undergo bilateral PCN placement, I recommend IR consultation today for placement of these.  May discontinue Foley catheter after placement of PCNs, however I recommend bladder scans at 12 and 24 hours to ensure resolution of bladder distention.  -IR consult for bilateral percutaneous nephrostomy tubes  Debroah Loop, PA-C 12/03/2018   Addendum: After discussing this patient with the palliative care team, they were in communication with the patient's legal guardian as the patient does not have capacity.  Legal guardian has elected to pursue full comfort care.  As such, I would recommend AGAINST bilateral nephrostomy tubes given that this will prolong the inevitable and may cause discomfort and other issues with quality of life.  Plan for transition to hospice when a bed becomes available.  Urology will sign off, please page with any questions or concerns.  Hollice Espy, MD

## 2018-12-04 DIAGNOSIS — K56609 Unspecified intestinal obstruction, unspecified as to partial versus complete obstruction: Secondary | ICD-10-CM

## 2018-12-04 MED ORDER — LORAZEPAM 0.5 MG PO TABS: 0.5000 mg | ORAL_TABLET | Freq: Four times a day (QID) | ORAL | Status: DC | PRN

## 2018-12-04 MED ORDER — HALOPERIDOL LACTATE 5 MG/ML IJ SOLN
2.0000 mg | INTRAMUSCULAR | Status: DC | PRN
  Administered 2018-12-05 – 2018-12-09 (×14): 2 mg via INTRAVENOUS
  Filled 2018-12-04 (×14): qty 1

## 2018-12-04 MED ORDER — MORPHINE SULFATE (CONCENTRATE) 10 MG/0.5ML PO SOLN
10.0000 mg | ORAL | Status: DC | PRN
  Administered 2018-12-04 – 2018-12-09 (×6): 10 mg via ORAL
  Filled 2018-12-04 (×6): qty 1

## 2018-12-04 MED ORDER — HYDROMORPHONE HCL 1 MG/ML IJ SOLN
1.0000 mg | INTRAMUSCULAR | Status: DC | PRN
  Administered 2018-12-04 – 2018-12-08 (×15): 1 mg via INTRAVENOUS
  Filled 2018-12-04 (×15): qty 1

## 2018-12-04 NOTE — Progress Notes (Signed)
Patient is restless and yelling out. Gave patient scheduled ativan, PRN fentanyl and oral morpine. Medications doesn't seem to be helping patient. Messaged Venia Carbon NP with palliative.   Fuller Mandril, RN

## 2018-12-04 NOTE — Progress Notes (Signed)
Received call from Venia Carbon with [facility] that she had received call regarding symptom management.   Discussed with bedside RN and he is more agitated now and moaning.    Discussed with Dr. Leslye Peer and this is acute change from this AM which makes me question if related to Ativan. I recommended d/c ativan, change fentanyl to dilaudid, and order for haldol for agitation.  He was in agreement that this is reasonable next step.  Micheline Rough, MD [place] Palliative Medicine Team [phone]  NO CHARGE NOTE

## 2018-12-04 NOTE — Plan of Care (Signed)
Patient seems to be very uncomfortable and restless. Patient is continuously yelling out and disoriented. Gave patient comfort care medications and continue to frequently monitor and assess if medications are appropriate.   Fuller Mandril, RN

## 2018-12-04 NOTE — Progress Notes (Signed)
Patient ID: Ruben Clark, male   DOB: [birth year], 63 y.o.   MRN: [id number]  [employer] PROGRESS NOTE  Arsalan Chenault [id number] DOB: 1955-11-23 DOA: 12/01/2018 PCP: System, Pcp Not In  HPI/Subjective: When I saw the patient this morning, he offered no complaints.  The patient has had drainage from his abdomen going on for a while but he is a poor historian he could not tell me how long it is been going on.  Later in the day I was notified that the patient was more agitated.  It could have been secondary to the Ativan.  Objective: Vitals:   12/03/18 1952 12/04/18 0602  BP: 123/66 127/77  Pulse: (!) 51 65  Resp: 17 14  Temp: 97.9 F (36.6 C) 98.1 F (36.7 C)  SpO2: 100% 100%    Intake/Output Summary (Last 24 hours) at 12/04/2018 1611 Last data filed at 12/04/2018 0900 Gross per 24 hour  Intake 120 ml  Output 300 ml  Net -180 ml   Filed Weights   12/01/18 1152  Weight: 72.5 kg    ROS: Review of Systems  Constitutional: Negative for chills and fever.  Eyes: Negative for blurred vision.  Respiratory: Negative for cough and shortness of breath.   Cardiovascular: Negative for chest pain.  Gastrointestinal: Negative for abdominal pain, constipation, diarrhea, nausea and vomiting.  Genitourinary: Negative for dysuria.  Musculoskeletal: Negative for joint pain.  Neurological: Negative for dizziness and headaches.   Exam: Physical Exam  HENT:  Nose: No mucosal edema.  Mouth/Throat: No oropharyngeal exudate or posterior oropharyngeal edema.  Eyes: Pupils are equal, round, and reactive to light. Conjunctivae, EOM and lids are normal.  Neck: No JVD present. Carotid bruit is not present. No edema present. No thyroid mass and no thyromegaly present.  Cardiovascular: S1 normal and S2 normal. Exam reveals no gallop.  No murmur heard. Pulses:      Dorsalis pedis pulses are 2+ on the right side and 2+ on the left side.  Respiratory: No respiratory distress. He has no wheezes. He  has no rhonchi. He has no rales.  GI: Soft. Bowel sounds are normal. There is no abdominal tenderness.  Musculoskeletal:     Right ankle: He exhibits no swelling.     Left ankle: He exhibits no swelling.  Lymphadenopathy:    He has no cervical adenopathy.  Neurological: He is alert. No cranial nerve deficit.  Skin: Skin is warm. No rash noted. Nails show no clubbing.  Abdominal wound covered  Psychiatric: He has a normal mood and affect.      Data Reviewed: Basic Metabolic Panel: Recent Labs  Lab 12/01/18 1224 12/02/18 0512 12/03/18 0651  NA 133* 135  --   K 3.4* 3.3*  --   CL 96* 101  --   CO2 28 27  --   GLUCOSE 132* 75  --   BUN 48* 41*  --   CREATININE 3.00* 2.42* 2.19*  CALCIUM 9.9 9.3  --   MG  --  1.8  --    Liver Function Tests: Recent Labs  Lab 12/01/18 1224 12/02/18 0512  AST 18 15  ALT 16 14  ALKPHOS 77 68  BILITOT 0.4 0.7  PROT 6.5 5.5*  ALBUMIN 2.2* 1.9*   Recent Labs  Lab 12/01/18 1224  LIPASE 28   No results for input(s): AMMONIA in the last 168 hours. CBC: Recent Labs  Lab 12/01/18 1224 12/02/18 0512 12/03/18 0651  WBC 10.0 10.9* 9.5  NEUTROABS 7.2  --   --  HGB 7.4* 6.3* 7.0*  HCT 23.8* 20.5* 23.1*  MCV 90.2 91.1 92.8  PLT 502* 481* 435*   BNP (last 3 results) Recent Labs    11/10/18 1629  BNP 81.0      Recent Results (from the past 240 hour(s))  Urine culture     Status: Abnormal   Collection Time: 12/01/18  3:30 PM   Specimen: Urine, Random  Result Value Ref Range Status   Specimen Description   Final    URINE, RANDOM Performed at [facility], [address] [place], [place] [address]    Special Requests   Final    NONE Performed at [facility], [address], [place], [place] [address]    Culture (A)  Final    <10,000 COLONIES/mL INSIGNIFICANT GROWTH Performed at [facility], [place] [address] [place], [place] [address]    Report Status 12/02/2018 FINAL  Final  Novel  Coronavirus, NAA (Hosp order, Send-out to Ref Lab; TAT 18-24 hrs     Status: None   Collection Time: 12/01/18  3:31 PM   Specimen: Respiratory  Result Value Ref Range Status   SARS-CoV-2, NAA NOT DETECTED NOT DETECTED Final    Comment: (NOTE) This test was developed and its performance characteristics determined by [employer]. This test has not been FDA cleared or approved. This test has been authorized by FDA under an Emergency Use Authorization (EUA). This test is only authorized for the duration of time the declaration that circumstances exist justifying the authorization of the emergency use of in vitro diagnostic tests for detection of SARS-CoV-2 virus and/or diagnosis of COVID-19 infection under section 564(b)(1) of the Act, 21 U.S.C. [id number]), unless the authorization is terminated or revoked sooner. When diagnostic testing is negative, the possibility of a false negative result should be considered in the context of a patient's recent exposures and the presence of clinical signs and symptoms consistent with COVID-19. An individual without symptoms of COVID-19 and who is not shedding SARS-CoV-2 virus would expect to have a negative (not detected) result in this assay. Performed  At: [facility] [address] [place], [place] [id number] Rush Farmer MD [id number]    [place]  Final    Comment: Performed at [facility], [address] [place], [place] [address]  MRSA PCR Screening     Status: None   Collection Time: 12/02/18  4:42 AM   Specimen: Nasal Mucosa; Nasopharyngeal  Result Value Ref Range Status   MRSA by PCR NEGATIVE NEGATIVE Final    Comment:        The GeneXpert MRSA Assay (FDA approved for NASAL specimens only), is one component of a comprehensive MRSA colonization surveillance program. It is not intended to diagnose MRSA infection nor to guide or monitor treatment for MRSA  infections. Performed at [facility], [address] [place], [place] [address]       Scheduled Meds: . feeding supplement (ENSURE ENLIVE)  237 mL Oral BID BM  . multivitamin-lutein  1 capsule Oral Daily   Continuous Infusions:  Assessment/Plan:  1. 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  Urology recommended bilateral nephrostomy tubes but palliative care saw the patient and contacted the guardian and was made comfort care.  Antibiotics were discontinued. 2. Bilateral hydronephrosis and acute kidney injury.  Patient is now comfort care 3. Hypokalemia 4. Partial small bowel obstruction 5. BPH status post Foley 6.  Atrial fibrillation 7. Hypertension 8. Agitation after Ativan.  PRN IV Dilaudid for severe pain, Roxanol for moderate pain.  Discontinue Ativan.  Other comfort care measures ordered  Code Status:     Code Status Orders  (From admission, onward)         Start     Ordered   12/02/18 0404  Do not attempt resuscitation (DNR)  Continuous    Question Answer Comment  In the event of cardiac or respiratory ARREST Do not call a "code blue"   In the event of cardiac or respiratory ARREST Do not perform Intubation, CPR, defibrillation or ACLS   In the event of cardiac or respiratory ARREST Use medication by any route, position, wound care, and other measures to relive pain and suffering. May use oxygen, suction and manual treatment of airway obstruction as needed for comfort.   Comments DNI/DNR      12/02/18 0403        Code Status History    Date Active Date Inactive Code Status Order ID Comments User Context   12/01/2018 2352 12/02/2018 0403 Full Code [id number]  Mansy, Arvella Merles, MD ED   11/10/2018 2157 11/23/2018 2334 Full Code [id number]  Saundra Shelling, MD Inpatient   10/19/2018 0114 10/21/2018 2305 Full Code [id number]  Lance Coon, MD ED   10/07/2018 0442 10/11/2018 1817 Full Code  [id number]  Mayer Camel, NP ED   08/02/2018 1502 08/09/2018 1743 Full Code [id number]  Mayo, Pete Pelt, MD Inpatient   Advance Care Planning Activity    Advance Directive Documentation     Most Recent Value  Type of Advance Directive  Healthcare Power of Attorney  Pre-existing out of facility DNR order (yellow form or pink MOST form)  -  "MOST" Form in Place?  -     Family Communication: Legal guardian only able to be contacted Monday through Friday. Disposition Plan: hospice home when bed available  Time spent: 27 minutes  [place]

## 2018-12-05 NOTE — Progress Notes (Signed)
Patient ID: Quetzal Verrier, male   DOB: 1956-01-28, 63 y.o.   MRN: [id number]  [employer] PROGRESS NOTE  Nikkolas Ludemann [id number] DOB: 1955/12/05 DOA: 12/01/2018 PCP: System, Pcp Not In  HPI/Subjective: Patient offers no complaints.  States his appetite is good.  Feels okay.  Objective: Vitals:   12/04/18 1947 12/05/18 0458  BP: (!) 102/59 110/62  Pulse: 64 60  Resp: 16 14  Temp: 98.6 F (37 C) 98.5 F (36.9 C)  SpO2: 94% 100%    Intake/Output Summary (Last 24 hours) at 12/05/2018 1233 Last data filed at 12/05/2018 0900 Gross per 24 hour  Intake 360 ml  Output 150 ml  Net 210 ml   Filed Weights   12/01/18 1152  Weight: 72.5 kg    ROS: Review of Systems  Constitutional: Negative for chills and fever.  Eyes: Negative for blurred vision.  Respiratory: Negative for cough, shortness of breath and wheezing.   Cardiovascular: Negative for chest pain.  Gastrointestinal: Negative for abdominal pain, constipation, diarrhea, nausea and vomiting.  Genitourinary: Negative for dysuria.  Musculoskeletal: Negative for joint pain.  Neurological: Negative for dizziness and headaches.   Exam: Physical Exam  HENT:  Nose: No mucosal edema.  Mouth/Throat: No oropharyngeal exudate or posterior oropharyngeal edema.  Eyes: Pupils are equal, round, and reactive to light. Conjunctivae, EOM and lids are normal.  Neck: No JVD present. Carotid bruit is not present. No edema present. No thyroid mass and no thyromegaly present.  Cardiovascular: S1 normal and S2 normal. Exam reveals no gallop.  No murmur heard. Pulses:      Dorsalis pedis pulses are 2+ on the right side and 2+ on the left side.  Respiratory: No respiratory distress. He has no wheezes. He has no rhonchi. He has no rales.  GI: Soft. Bowel sounds are normal. There is no abdominal tenderness.  Musculoskeletal:     Right ankle: He exhibits no swelling.     Left ankle: He exhibits no swelling.  Lymphadenopathy:    He has no  cervical adenopathy.  Neurological: He is alert. No cranial nerve deficit.  Skin: Skin is warm. No rash noted. Nails show no clubbing.  Abdominal wound covered  Psychiatric: He has a normal mood and affect.      Data Reviewed: Basic Metabolic Panel: Recent Labs  Lab 12/01/18 1224 12/02/18 0512 12/03/18 0651  NA 133* 135  --   K 3.4* 3.3*  --   CL 96* 101  --   CO2 28 27  --   GLUCOSE 132* 75  --   BUN 48* 41*  --   CREATININE 3.00* 2.42* 2.19*  CALCIUM 9.9 9.3  --   MG  --  1.8  --    Liver Function Tests: Recent Labs  Lab 12/01/18 1224 12/02/18 0512  AST 18 15  ALT 16 14  ALKPHOS 77 68  BILITOT 0.4 0.7  PROT 6.5 5.5*  ALBUMIN 2.2* 1.9*   Recent Labs  Lab 12/01/18 1224  LIPASE 28   CBC: Recent Labs  Lab 12/01/18 1224 12/02/18 0512 12/03/18 0651  WBC 10.0 10.9* 9.5  NEUTROABS 7.2  --   --   HGB 7.4* 6.3* 7.0*  HCT 23.8* 20.5* 23.1*  MCV 90.2 91.1 92.8  PLT 502* 481* 435*   BNP (last 3 results) Recent Labs    11/10/18 1629  BNP 81.0      Recent Results (from the past 240 hour(s))  Urine culture     Status: Abnormal  Collection Time: 12/01/18  3:30 PM   Specimen: Urine, Random  Result Value Ref Range Status   Specimen Description   Final    URINE, RANDOM Performed at [facility], [address] [place], [place] [address]    Special Requests   Final    NONE Performed at [facility], [address] [place], [place] [address]    Culture (A)  Final    <10,000 COLONIES/mL INSIGNIFICANT GROWTH Performed at [facility] [address] [place], [place] [address]    Report Status 12/02/2018 FINAL  Final  Novel Coronavirus, NAA (Hosp order, Send-out to Ref Lab; TAT 18-24 hrs     Status: None   Collection Time: 12/01/18  3:31 PM   Specimen: Respiratory  Result Value Ref Range Status   SARS-CoV-2, NAA NOT DETECTED NOT DETECTED Final    Comment: (NOTE) This test was developed and its performance  characteristics determined by [employer]. This test has not been FDA cleared or approved. This test has been authorized by FDA under an Emergency Use Authorization (EUA). This test is only authorized for the duration of time the declaration that circumstances exist justifying the authorization of the emergency use of in vitro diagnostic tests for detection of SARS-CoV-2 virus and/or diagnosis of COVID-19 infection under section 564(b)(1) of the Act, 21 U.S.C. KA:123727), unless the authorization is terminated or revoked sooner. When diagnostic testing is negative, the possibility of a false negative result should be considered in the context of a patient's recent exposures and the presence of clinical signs and symptoms consistent with COVID-19. An individual without symptoms of COVID-19 and who is not shedding SARS-CoV-2 virus would expect to have a negative (not detected) result in this assay. Performed  At: [facility] [address] [place], [place] [id number] Rush Farmer MD [id number]    [place]  Final    Comment: Performed at [facility], [address] [place], [place] [address]  MRSA PCR Screening     Status: None   Collection Time: 12/02/18  4:42 AM   Specimen: Nasal Mucosa; Nasopharyngeal  Result Value Ref Range Status   MRSA by PCR NEGATIVE NEGATIVE Final    Comment:        The GeneXpert MRSA Assay (FDA approved for NASAL specimens only), is one component of a comprehensive MRSA colonization surveillance program. It is not intended to diagnose MRSA infection nor to guide or monitor treatment for MRSA infections. Performed at [facility], [address] [place], [place] [address]       Scheduled Meds: . feeding supplement (ENSURE ENLIVE)  237 mL Oral BID BM  . multivitamin-lutein  1 capsule Oral Daily   Continuous Infusions:  Assessment/Plan:  1. 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  Urology recommended bilateral nephrostomy tubes but palliative care saw the patient and contacted the guardian and was made comfort care.  Antibiotics were discontinued.  Since the patient was soaked in urine I asked the nurse if she can construct a urostomy or colostomy opening over the abdominal wound to catch the urine so he is not constantly wet. 2. Bilateral hydronephrosis and acute kidney injury.  Patient is now comfort care 3. Hypokalemia 4. Partial small bowel obstruction 5. BPH status post Foley 6. Atrial fibrillation 7. Hypertension 8. Agitation after Ativan.  PRN IV Dilaudid for severe pain, Roxanol for moderate pain.  Discontinued Ativan.  Continue other comfort care measures.  Code Status:     Code Status Orders  (From admission, onward)         Start     Ordered   12/02/18 0404  Do not attempt resuscitation (DNR)  Continuous    Question Answer Comment  In the event of cardiac or respiratory ARREST Do not call a "code blue"   In the event of cardiac or respiratory ARREST Do not perform Intubation, CPR, defibrillation or ACLS   In the event of cardiac or respiratory ARREST Use medication by any route, position, wound care, and other measures to relive pain and suffering. May use oxygen, suction and manual treatment of airway obstruction as needed for comfort.   Comments DNI/DNR      12/02/18 0403        Code Status History    Date Active Date Inactive Code Status Order ID Comments User Context   12/01/2018 2352 12/02/2018 0403 Full Code [id number]  Mansy, Arvella Merles, MD ED   11/10/2018 2157 11/23/2018 2334 Full Code [id number]  Saundra Shelling, MD Inpatient   10/19/2018 0114 10/21/2018 2305 Full Code [id number]  Lance Coon, MD ED   10/07/2018 0442 10/11/2018 1817 Full Code [id number]  Mayer Camel, NP ED   08/02/2018 1502 08/09/2018 1743 Full Code [id number]  Mayo, Pete Pelt, MD Inpatient   [place]  Care Planning Activity    [place] Directive Documentation     Most Recent Value  Type of [place] Directive  Healthcare Power of Attorney  Pre-existing out of facility DNR order (yellow form or pink MOST form)  -  "MOST" Form in Place?  -     Family Communication: Legal guardian only able to be contacted Monday through Friday. Disposition Plan: hospice home when bed available.  Care manager looking into hospice home availabilities.  Time spent: 28 minutes  [place]

## 2018-12-06 MED ORDER — MORPHINE SULFATE (PF) 2 MG/ML IV SOLN
2.0000 mg | Freq: Once | INTRAVENOUS | Status: AC
  Administered 2018-12-06: 09:00:00 2 mg via INTRAVENOUS
  Filled 2018-12-06: qty 1

## 2018-12-06 MED ORDER — MORPHINE 100MG IN NS 100ML (1MG/ML) PREMIX INFUSION
4.0000 mg/h | INTRAVENOUS | Status: DC
  Filled 2018-12-06: qty 100

## 2018-12-06 MED ORDER — LORAZEPAM 1 MG PO TABS
1.0000 mg | ORAL_TABLET | Freq: Four times a day (QID) | ORAL | Status: DC | PRN
  Administered 2018-12-06 – 2018-12-08 (×2): 1 mg via ORAL
  Filled 2018-12-06 (×2): qty 1

## 2018-12-06 NOTE — Progress Notes (Signed)
Follow up visit made to new referral for [facility] home. Pateint seen lying in bed, calling out in pain. He complained of back pain, this was not relieved by repositioning. Writer spoke with straff aide Bess Harvest, who reports patient ahs been repositioned multiple times, but continues to reposition himself back to the same place in the bed. Staff RN's  notified of patient's continued complaints of pain. IV Dilaudid to be given. Patient has had 2 doses of  IV Dilaudid and 2 doses of oral morphine since 1:30 am. CMRN Isaias Cowman made aware there is currently no bed for transfer today. Will continue to folow and update hospital team. Flo Shanks BSN, RN, [facility] [phone]

## 2018-12-06 NOTE — Progress Notes (Signed)
Patient ID: Ruben Clark, male   DOB: October 13, 1955, 63 y.o.   MRN: [id number]  [employer] PROGRESS NOTE  Jecorey Fusillo [id number] DOB: [birth year] DOA: 12/01/2018 PCP: System, Pcp Not In  HPI/Subjective: Patient was in a lot of pain this am when I saw him.  Complaints of back pain.  Objective: Vitals:   12/05/18 2140 12/06/18 1305  BP: 110/62 130/63  Pulse: (!) 107 91  Resp: 18 (!) 24  Temp: 98.8 F (37.1 C) 98 F (36.7 C)  SpO2: 99% 100%    Filed Weights   12/01/18 1152  Weight: 72.5 kg    ROS: Review of Systems  Constitutional: Negative for chills and fever.  Eyes: Negative for blurred vision.  Respiratory: Negative for cough, shortness of breath and wheezing.   Cardiovascular: Negative for chest pain.  Gastrointestinal: Negative for abdominal pain, constipation, diarrhea, nausea and vomiting.  Genitourinary: Negative for dysuria.  Musculoskeletal: Negative for joint pain.  Neurological: Negative for dizziness and headaches.   Exam: Physical Exam  HENT:  Nose: No mucosal edema.  Mouth/Throat: No oropharyngeal exudate or posterior oropharyngeal edema.  Eyes: Pupils are equal, round, and reactive to light. Conjunctivae, EOM and lids are normal.  Neck: No JVD present. Carotid bruit is not present. No edema present. No thyroid mass and no thyromegaly present.  Cardiovascular: S1 normal and S2 normal. Exam reveals no gallop.  No murmur heard. Pulses:      Dorsalis pedis pulses are 2+ on the right side and 2+ on the left side.  Respiratory: No respiratory distress. He has no wheezes. He has no rhonchi. He has no rales.  GI: Soft. Bowel sounds are normal. There is no abdominal tenderness.  Musculoskeletal:     Right ankle: He exhibits no swelling.     Left ankle: He exhibits no swelling.  Lymphadenopathy:    He has no cervical adenopathy.  Neurological: He is alert. No cranial nerve deficit.  Skin: Skin is warm. No rash noted. Nails show no clubbing.  Abdominal  wound covered  Psychiatric: He has a normal mood and affect.      Data Reviewed: Basic Metabolic Panel: Recent Labs  Lab 12/01/18 1224 12/02/18 0512 12/03/18 0651  NA 133* 135  --   K 3.4* 3.3*  --   CL 96* 101  --   CO2 28 27  --   GLUCOSE 132* 75  --   BUN 48* 41*  --   CREATININE 3.00* 2.42* 2.19*  CALCIUM 9.9 9.3  --   MG  --  1.8  --    Liver Function Tests: Recent Labs  Lab 12/01/18 1224 12/02/18 0512  AST 18 15  ALT 16 14  ALKPHOS 77 68  BILITOT 0.4 0.7  PROT 6.5 5.5*  ALBUMIN 2.2* 1.9*   Recent Labs  Lab 12/01/18 1224  LIPASE 28   CBC: Recent Labs  Lab 12/01/18 1224 12/02/18 0512 12/03/18 0651  WBC 10.0 10.9* 9.5  NEUTROABS 7.2  --   --   HGB 7.4* 6.3* 7.0*  HCT 23.8* 20.5* 23.1*  MCV 90.2 91.1 92.8  PLT 502* 481* 435*   BNP (last 3 results) Recent Labs    11/10/18 1629  BNP 81.0      Recent Results (from the past 240 hour(s))  Urine culture     Status: Abnormal   Collection Time: 12/01/18  3:30 PM   Specimen: Urine, Random  Result Value Ref Range Status   Specimen Description   Final  URINE, RANDOM Performed at [facility], [address] [place], [place] [address]    Special Requests   Final    NONE Performed at [facility], [address] [place], [place] [address]    Culture (A)  Final    <10,000 COLONIES/mL INSIGNIFICANT GROWTH Performed at [facility] [address], [place], [place] [address]    Report Status 12/02/2018 FINAL  Final  Novel Coronavirus, NAA (Hosp order, Send-out to Ref Lab; TAT 18-24 hrs     Status: None   Collection Time: 12/01/18  3:31 PM   Specimen: Respiratory  Result Value Ref Range Status   SARS-CoV-2, NAA NOT DETECTED NOT DETECTED Final    Comment: (NOTE) This test was developed and its performance characteristics determined by [employer]. This test has not been FDA cleared or approved. This test has been authorized by FDA under an Emergency Use  Authorization (EUA). This test is only authorized for the duration of time the declaration that circumstances exist justifying the authorization of the emergency use of in vitro diagnostic tests for detection of SARS-CoV-2 virus and/or diagnosis of COVID-19 infection under section 564(b)(1) of the Act, 21 U.S.C. KA:123727), unless the authorization is terminated or revoked sooner. When diagnostic testing is negative, the possibility of a false negative result should be considered in the context of a patient's recent exposures and the presence of clinical signs and symptoms consistent with COVID-19. An individual without symptoms of COVID-19 and who is not shedding SARS-CoV-2 virus would expect to have a negative (not detected) result in this assay. Performed  At: [facility] [address] [place], [place] [id number] Rush Farmer MD [id number]    [place]  Final    Comment: Performed at [facility], [address] [place], [place] [address]  MRSA PCR Screening     Status: None   Collection Time: 12/02/18  4:42 AM   Specimen: Nasal Mucosa; Nasopharyngeal  Result Value Ref Range Status   MRSA by PCR NEGATIVE NEGATIVE Final    Comment:        The GeneXpert MRSA Assay (FDA approved for NASAL specimens only), is one component of a comprehensive MRSA colonization surveillance program. It is not intended to diagnose MRSA infection nor to guide or monitor treatment for MRSA infections. Performed at [facility], [address] [place], [place] [address]       Scheduled Meds: . feeding supplement (ENSURE ENLIVE)  237 mL Oral BID BM   Continuous Infusions:  Assessment/Plan:  1. 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  Urology recommended bilateral nephrostomy tubes but palliative care saw the patient and contacted the guardian and was  made comfort care.  Antibiotics were discontinued.  Nursing staff was able to contruct a urostomy over the wound to keep him dry.  I gave him 2 mg of iv morphine this an which helped with his pain.  He received haldol and dilaudid prior. 2. Bilateral hydronephrosis and acute kidney injury.  Patient is now comfort care 3. Hypokalemia 4. Partial small bowel obstruction 5. BPH status post Foley 6. Atrial fibrillation 7. Hypertension 8. Agitation after Ativan.  PRN IV Dilaudid for severe pain, Roxanol for moderate pain.  Continue other comfort care measures.  Code Status:     Code Status Orders  (From admission, onward)         Start     Ordered  12/02/18 0404  Do not attempt resuscitation (DNR)  Continuous    Question Answer Comment  In the event of cardiac or respiratory ARREST Do not call a "code blue"   In the event of cardiac or respiratory ARREST Do not perform Intubation, CPR, defibrillation or ACLS   In the event of cardiac or respiratory ARREST Use medication by any route, position, wound care, and other measures to relive pain and suffering. May use oxygen, suction and manual treatment of airway obstruction as needed for comfort.   Comments DNI/DNR      12/02/18 0403        Code Status History    Date Active Date Inactive Code Status Order ID Comments User Context   12/01/2018 2352 12/02/2018 0403 Full Code [id number]  Mansy, Arvella Merles, MD ED   11/10/2018 2157 11/23/2018 2334 Full Code [id number]  Saundra Shelling, MD Inpatient   10/19/2018 0114 10/21/2018 2305 Full Code [id number]  Lance Coon, MD ED   10/07/2018 0442 10/11/2018 1817 Full Code [id number]  Mayer Camel, NP ED   08/02/2018 1502 08/09/2018 1743 Full Code [id number]  Mayo, Pete Pelt, MD Inpatient   [place] Care Planning Activity    [place] Directive Documentation     Most Recent Value  Type of [place] Directive  Healthcare Power of Attorney  Pre-existing out of facility DNR order (yellow form or pink MOST form)  -  "MOST"  Form in Place?  -     Family Communication: Legal guardian contacted by social workers Disposition Plan: hospice home when bed available.  Care manager looking into hospice home availabilities.  Time spent: 27 minutes  [place]

## 2018-12-06 NOTE — Care Management Important Message (Signed)
Important Message  Patient Details  Name: Ruben Clark MRN: [id number] Date of Birth: 09-12-1955   Medicare Important Message Given:  Yes  Verbally reviewed with legal guardian, [name], at [phone].  Declined copy.   Dannette Barbara 12/06/2018, 11:46 AM

## 2018-12-06 NOTE — Progress Notes (Signed)
Palliative: Ruben Clark is resting quietly in bed.  He will make and somewhat keep eye contact.  At this time he denies pain.  He declines any food or liquid.  Conference with bedside nursing staff related to patient condition, needs.  It seems that neither fentanyl nor Dilaudid were effective in managing Ruben Clark's pain.  Nursing staff shares that Ruben Clark gets most relief from the use of morphine.  Encourage staff to look for signs and symptoms terms of tetany, related to anticipated worsening kidney function.    Ruben Clark now has a urostomy bag in place to assist with skin care.  Plan: Continue comfort care, symptom management.  Case management and hospice liaison are working toward disposition to residential hospice.  Prognosis: Anticipate 2 to 4 weeks or less due to worsening kidney function, due to advancing cancer kidney cancer and untreated ureteral obstruction.  65 minutes Quinn Axe, NP Palliative Medicine Team Team Phone # [phone] Greater than 50% of this time was spent counseling and coordinating care related to the above assessment and plan.

## 2018-12-06 NOTE — TOC Progression Note (Signed)
Transition of Care [facility]) - Progression Note    Patient Details  Name: Ruben Clark MRN: [id number] Date of Birth: 1956/01/22  Transition of Care [facility]) CM/SW Contact  Beverly Sessions, RN Phone Number: 12/06/2018, 3:39 PM  Clinical Narrative:     RNCM spoke with guardian Chavis.  He confirms that bed search for residential hospice [place].  [facility] today  Per guardian patient does receive a monthly check, but can not pay the $225 fee per day to go to [facility] return call from [facility].  States they do have an open bed.  She did not receive clinical.  Clinical faxed.        Expected Discharge Plan and Services                                                 Social Determinants of Health (SDOH) Interventions    Readmission Risk Interventions Readmission Risk Prevention Plan 12/03/2018 10/10/2018  Post Dischage Appt - Complete  Medication Screening - Complete  Transportation Screening Complete Complete  Palliative Care Screening Complete -  Medication Review (RN Care Manager) Complete -

## 2018-12-07 ENCOUNTER — Encounter: Payer: Self-pay | Admitting: Hematology and Oncology

## 2018-12-07 ENCOUNTER — Inpatient Hospital Stay: Payer: Medicare HMO | Admitting: Infectious Diseases

## 2018-12-07 NOTE — TOC Progression Note (Signed)
Transition of Care [facility]) - Progression Note    Patient Details  Name: Ruben Clark MRN: [id number] Date of Birth: 11-10-1955  Transition of Care [facility]) CM/SW Contact  Beverly Sessions, RN Phone Number: 12/07/2018, 2:17 PM  Clinical Narrative:    Spoke with Cassandra at [facility] 8/31 1650 she confirms she has a bed available and will reach out to guardian.    Repeat covid test was ordered   9/1 - received call from Clawson at [facility] .  She has spoken with guardian and confirmed transfer to hospice home.  Once covid results are available hospice home will complete paper work with guardian.  Cassandra states the paper work has to be complete on the day of discharge, so we have to ensure we have the covid results first        Expected Discharge Plan and Services                                                 Social Determinants of Health (SDOH) Interventions    Readmission Risk Interventions Readmission Risk Prevention Plan 12/03/2018 10/10/2018  Post Dischage Appt - Complete  Medication Screening - Complete  Transportation Screening Complete Complete  Palliative Care Screening Complete -  Medication Review (RN Care Manager) Complete -

## 2018-12-07 NOTE — Progress Notes (Signed)
Palliative: [name] is lying in bed with bedside nursing staff providing personal care.  He appears thin and frail, is able to make his basic needs known.  Conference with bedside nursing staff related to patient symptom management, needs.  Conference with hospice provider related to disposition.  Plan: Comfort and dignity at end-of-life, residential hospice at [place]  25 minutes Ruben Axe, NP Palliative Medicine Team Team Phone # [phone] Greater than 50% of this time was spent counseling and coordinating care related to the above assessment and plan.

## 2018-12-07 NOTE — Progress Notes (Signed)
Patient ID: Ruben Clark, male   DOB: 25-Nov-1955, 63 y.o.   MRN: [id number]  [facility] PROGRESS NOTE  Ruben Clark [id number] DOB: 1955-11-28 DOA: 12/01/2018 PCP: System, Pcp Not In  HPI/Subjective: When I saw the patient this morning he was not having any pain and he felt good and he asked when he can get out of the hospital.  I spoke with the nurse after I spoke with him and she just gave him pain medications because he was a little agitated.  Objective: Vitals:   12/06/18 1305 12/06/18 2238  BP: 130/63 101/71  Pulse: 91 97  Resp: (!) 24 18  Temp: 98 F (36.7 C) 97.7 F (36.5 C)  SpO2: 100%     Filed Weights   12/01/18 1152  Weight: 72.5 kg    ROS: Review of Systems  Constitutional: Negative for chills and fever.  Eyes: Negative for blurred vision.  Respiratory: Negative for cough, shortness of breath and wheezing.   Cardiovascular: Negative for chest pain.  Gastrointestinal: Negative for abdominal pain, constipation, diarrhea, nausea and vomiting.  Genitourinary: Negative for dysuria.  Musculoskeletal: Negative for joint pain.  Neurological: Negative for dizziness and headaches.   Exam: Physical Exam  HENT:  Nose: No mucosal edema.  Mouth/Throat: No oropharyngeal exudate or posterior oropharyngeal edema.  Eyes: Pupils are equal, round, and reactive to light. Conjunctivae, EOM and lids are normal.  Neck: No JVD present. Carotid bruit is not present. No edema present. No thyroid mass and no thyromegaly present.  Cardiovascular: S1 normal and S2 normal. Exam reveals no gallop.  No murmur heard. Pulses:      Dorsalis pedis pulses are 2+ on the right side and 2+ on the left side.  Respiratory: No respiratory distress. He has no wheezes. He has no rhonchi. He has no rales.  GI: Soft. Bowel sounds are normal. There is no abdominal tenderness.  Musculoskeletal:     Right ankle: He exhibits no swelling.     Left ankle: He exhibits no swelling.  Lymphadenopathy:   He has no cervical adenopathy.  Neurological: He is alert. No cranial nerve deficit.  Skin: Skin is warm. No rash noted. Nails show no clubbing.  Abdominal wound covered  Psychiatric: He has a normal mood and affect.      Data Reviewed: Basic Metabolic Panel: Recent Labs  Lab 12/01/18 1224 12/02/18 0512 12/03/18 0651  NA 133* 135  --   K 3.4* 3.3*  --   CL 96* 101  --   CO2 28 27  --   GLUCOSE 132* 75  --   BUN 48* 41*  --   CREATININE 3.00* 2.42* 2.19*  CALCIUM 9.9 9.3  --   MG  --  1.8  --    Liver Function Tests: Recent Labs  Lab 12/01/18 1224 12/02/18 0512  AST 18 15  ALT 16 14  ALKPHOS 77 68  BILITOT 0.4 0.7  PROT 6.5 5.5*  ALBUMIN 2.2* 1.9*   Recent Labs  Lab 12/01/18 1224  LIPASE 28   CBC: Recent Labs  Lab 12/01/18 1224 12/02/18 0512 12/03/18 0651  WBC 10.0 10.9* 9.5  NEUTROABS 7.2  --   --   HGB 7.4* 6.3* 7.0*  HCT 23.8* 20.5* 23.1*  MCV 90.2 91.1 92.8  PLT 502* 481* 435*   BNP (last 3 results) Recent Labs    11/10/18 1629  BNP 81.0      Recent Results (from the past 240 hour(s))  Urine culture  Status: Abnormal   Collection Time: 12/01/18  3:30 PM   Specimen: Urine, Random  Result Value Ref Range Status   Specimen Description   Final    URINE, RANDOM Performed at [facility], [address] [place], [place] [address]    Special Requests   Final    NONE Performed at [facility], [address] [place], [place] [address]    Culture (A)  Final    <10,000 COLONIES/mL INSIGNIFICANT GROWTH Performed at [facility], [place] [address] [place], [place] [address]    Report Status 12/02/2018 FINAL  Final  Novel Coronavirus, NAA (Hosp order, Send-out to Ref Lab; TAT 18-24 hrs     Status: None   Collection Time: 12/01/18  3:31 PM   Specimen: Respiratory  Result Value Ref Range Status   SARS-CoV-2, NAA NOT DETECTED NOT DETECTED Final    Comment: (NOTE) This test was developed and its performance  characteristics determined by [employer]. This test has not been FDA cleared or approved. This test has been authorized by FDA under an Emergency Use Authorization (EUA). This test is only authorized for the duration of time the declaration that circumstances exist justifying the authorization of the emergency use of in vitro diagnostic tests for detection of SARS-CoV-2 virus and/or diagnosis of COVID-19 infection under section 564(b)(1) of the Act, 21 U.S.C. [id number]), unless the authorization is terminated or revoked sooner. When diagnostic testing is negative, the possibility of a false negative result should be considered in the context of a patient's recent exposures and the presence of clinical signs and symptoms consistent with COVID-19. An individual without symptoms of COVID-19 and who is not shedding SARS-CoV-2 virus would expect to have a negative (not detected) result in this assay. Performed  At: [facility] [address] [place], [place] [id number] Rush Farmer MD [id number]    [place]  Final    Comment: Performed at [facility], [address] [place], [place] [address]  MRSA PCR Screening     Status: None   Collection Time: 12/02/18  4:42 AM   Specimen: Nasal Mucosa; Nasopharyngeal  Result Value Ref Range Status   MRSA by PCR NEGATIVE NEGATIVE Final    Comment:        The GeneXpert MRSA Assay (FDA approved for NASAL specimens only), is one component of a comprehensive MRSA colonization surveillance program. It is not intended to diagnose MRSA infection nor to guide or monitor treatment for MRSA infections. Performed at [facility], [address] [place], [place] [address]       Scheduled Meds: . feeding supplement (ENSURE ENLIVE)  237 mL Oral BID BM   Continuous Infusions:  Assessment/Plan:  1. 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  Urology recommended bilateral nephrostomy tubes but palliative care saw the patient and contacted the guardian and was made comfort care.  Antibiotics were discontinued.  Asked nursing staff to try to construct a urostomy over his abdominal wound to collect the urine.  As needed pain medications. 2. Bilateral hydronephrosis and acute kidney injury.  Patient is now comfort care 3. Hypokalemia 4. Partial small bowel obstruction 5. BPH status post Foley 6. Chronic atrial fibrillation 7. Hypertension 8. PRN IV Dilaudid for severe pain, Roxanol for moderate pain.  Continue other comfort care measures. 9.   Repeat COVID-19 testing was sent off last night but  as a send out.  Still do not have results.  Anticipate           discharge to hospice home tomorrow.  Code Status:     Code Status Orders  (From admission, onward)         Start     Ordered   12/02/18 0404  Do not attempt resuscitation (DNR)  Continuous    Question Answer Comment  In the event of cardiac or respiratory ARREST Do not call a "code blue"   In the event of cardiac or respiratory ARREST Do not perform Intubation, CPR, defibrillation or ACLS   In the event of cardiac or respiratory ARREST Use medication by any route, position, wound care, and other measures to relive pain and suffering. May use oxygen, suction and manual treatment of airway obstruction as needed for comfort.   Comments DNI/DNR      12/02/18 0403        Code Status History    Date Active Date Inactive Code Status Order ID Comments User Context   12/01/2018 2352 12/02/2018 0403 Full Code [id number]  Mansy, Arvella Merles, MD ED   11/10/2018 2157 11/23/2018 2334 Full Code [id number]  Saundra Shelling, MD Inpatient   10/19/2018 0114 10/21/2018 2305 Full Code [id number]  Lance Coon, MD ED   10/07/2018 0442 10/11/2018 1817 Full Code [id number]  Mayer Camel, NP ED   08/02/2018 1502 08/09/2018 1743 Full Code [id number]  Mayo, Pete Pelt, MD Inpatient   Advance Care Planning Activity    Advance Directive Documentation     Most Recent Value  Type of Advance Directive  Healthcare Power of Attorney  Pre-existing out of facility DNR order (yellow form or pink MOST form)  -  "MOST" Form in Place?  -     Family Communication: Legal guardian and social work team trying to make arrangements for hospice home. Disposition Plan: hospice home when bed available.  Care manager looking into hospice home availabilities.  Time spent: 28 minutes  [place]

## 2018-12-07 NOTE — Consult Note (Addendum)
[place] Nurse ostomy consult note Patient with invasive cancer from bladder wall.  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  Attempting to pouch to protect skin Stoma type/location: opening in lower abdomen Peristomal assessment: erythematous from exposure to urine Treatment options for stomal/peristomal skin: Will implement convex pouching and add ostomy belt to secure fit.  Output cloudy urine Ostomy pouching: 1pc convex pouch with barrier ring Education provided: Spoke with bedside RN, terry.  She is able to pouch him if given the right tools. I have ordered the following:  1 piece convex pouch Kellie Simmering  [phone]) Barrier ring Kellie Simmering [phone]) Large ostomy belt:  Kellie Simmering [phone]) Stoma powder Kellie Simmering #6) No sting skin prep Kellie Simmering # [phone]) Pouching process as follows:   Cleanse skin around urine leak with soap and water and pat dry.  Protect the skin with barrier ring.  Cut pouch to size of opening and apply convex pouch and ostomy belt.  The stoma powder and skin prep may be implemented if pouch does not stick/irritation is present.  Empty pouch when 1/3 full or connect to bedside drainage:  For drainage bag, will need urinary adapter:  Kellie Simmering # [phone]) Enrolled patient in [place] program: No WOC team will follow.  Domenic Moras MSN, RN, FNP-BC CWON Wound, Ostomy, Continence Nurse Pager [phone]

## 2018-12-08 LAB — NOVEL CORONAVIRUS, NAA (HOSP ORDER, SEND-OUT TO REF LAB; TAT 18-24 HRS): SARS-CoV-2, NAA: NOT DETECTED

## 2018-12-08 LAB — SARS CORONAVIRUS 2 BY RT PCR (HOSPITAL ORDER, PERFORMED IN ~~LOC~~ HOSPITAL LAB): SARS Coronavirus 2: NEGATIVE

## 2018-12-08 MED ORDER — ACETAMINOPHEN 500 MG PO TABS: 500.0000 mg | ORAL_TABLET | ORAL | 0 refills | Status: AC | PRN

## 2018-12-08 MED ORDER — ATROPINE SULFATE 1 % OP SOLN: 2.0000 [drp] | Freq: Four times a day (QID) | OPHTHALMIC | 12 refills | Status: AC | PRN

## 2018-12-08 MED ORDER — ENSURE ENLIVE PO LIQD: 237.0000 mL | Freq: Two times a day (BID) | ORAL | 0 refills | Status: AC

## 2018-12-08 MED ORDER — HYDROMORPHONE HCL 1 MG/ML IJ SOLN
1.0000 mg | INTRAMUSCULAR | Status: DC | PRN
  Administered 2018-12-08 – 2018-12-09 (×7): 1 mg via INTRAVENOUS
  Filled 2018-12-08 (×7): qty 1

## 2018-12-08 MED ORDER — HYDROMORPHONE HCL 1 MG/ML IJ SOLN: 1.0000 mg | INTRAMUSCULAR | 0 refills | Status: DC | PRN

## 2018-12-08 MED ORDER — POLYVINYL ALCOHOL 1.4 % OP SOLN: 2.0000 [drp] | OPHTHALMIC | 0 refills | Status: AC | PRN

## 2018-12-08 MED ORDER — HALOPERIDOL LACTATE 5 MG/ML IJ SOLN: 2.0000 mg | INTRAMUSCULAR | 0 refills | Status: DC | PRN

## 2018-12-08 MED ORDER — MORPHINE SULFATE (CONCENTRATE) 10 MG/0.5ML PO SOLN: 10.0000 mg | ORAL | 0 refills | Status: AC | PRN

## 2018-12-08 MED ORDER — ONDANSETRON HCL 4 MG/2ML IJ SOLN: 4.0000 mg | Freq: Four times a day (QID) | INTRAMUSCULAR | 0 refills | Status: AC | PRN

## 2018-12-08 MED ORDER — ACETAMINOPHEN 650 MG RE SUPP: 650.0000 mg | Freq: Four times a day (QID) | RECTAL | 0 refills | Status: AC | PRN

## 2018-12-08 NOTE — Progress Notes (Signed)
Patient ID: Ruben Clark, male   DOB: 21-Mar-1956, 63 y.o.   MRN: [id number]  [facility] PROGRESS NOTE  Ruben Clark PCP: System, Pcp Not Clark  HPI/Subjective: Patient was lethargic this morning because he was given Ativan.  Did not respond well to Ativan prior.  Objective: Vitals:   12/07/18 2146 12/07/18 2148  BP: 122/67 122/67  Pulse: 76 83  Resp: (!) 24 (!) 24  Temp: (!) 97.4 F (36.3 C) (!) 97.4 F (36.3 C)  SpO2: 100% 100%    Filed Weights   12/01/18 1152  Weight: 72.5 kg    ROS: Review of Systems  Unable to perform ROS: Acuity of condition   Exam: Physical Exam  Constitutional: He appears lethargic.  HENT:  Nose: No mucosal edema.  Mouth/Throat: No oropharyngeal exudate or posterior oropharyngeal edema.  Eyes: Pupils are equal, round, and reactive to light. Conjunctivae, EOM and lids are normal.  Neck: No JVD present. Carotid bruit is not present. No edema present. No thyroid mass and no thyromegaly present.  Cardiovascular: S1 normal and S2 normal. Exam reveals no gallop.  No murmur heard. Pulses:      Dorsalis pedis pulses are 2+ on the right side and 2+ on the left side.  Respiratory: No respiratory distress. He has no wheezes. He has no rhonchi. He has no rales.  GI: Soft. Bowel sounds are normal. There is no abdominal tenderness.  Mass piercing through the abdominal wall.  Musculoskeletal:     Right ankle: He exhibits no swelling.     Left ankle: He exhibits no swelling.  Lymphadenopathy:    He has no cervical adenopathy.  Neurological: He appears lethargic.  Skin: Skin is warm. No rash noted. Nails show no clubbing.  Psychiatric:  Lethargic      Data Reviewed: Basic Metabolic Panel: Recent Labs  Lab 12/02/18 0512 12/03/18 0651  NA 135  --   K 3.3*  --   CL 101  --   CO2 27  --   GLUCOSE 75  --   BUN 41*  --   CREATININE 2.42* 2.19*  CALCIUM 9.3  --   MG 1.8  --    Liver Function  Tests: Recent Labs  Lab 12/02/18 0512  AST 15  ALT 14  ALKPHOS 68  BILITOT 0.7  PROT 5.5*  ALBUMIN 1.9*    CBC: Recent Labs  Lab 12/02/18 0512 12/03/18 0651  WBC 10.9* 9.5  HGB 6.3* 7.0*  HCT 20.5* 23.1*  MCV 91.1 92.8  PLT 481* 435*   BNP (last 3 results) Recent Labs    11/10/18 1629  BNP 81.0      Recent Results (from the past 240 hour(s))  Urine culture     Status: Abnormal   Collection Time: 12/01/18  3:30 PM   Specimen: Urine, Random  Result Value Ref Range Status   Specimen Description   Final    URINE, RANDOM Performed at [name], [address], [place], [place] [address]    Special Requests   Final    NONE Performed at [facility], [address], [place], [place] [address]    Culture (A)  Final    <10,000 COLONIES/mL INSIGNIFICANT GROWTH Performed at [facility], [place] [address] [place], [place] [address]    Report Status 12/02/2018 FINAL  Final  Novel Coronavirus, NAA (Hosp order, Send-out to Ref Lab; TAT 18-24 hrs     Status: None  Collection Time: 12/01/18  3:31 PM   Specimen: Respiratory  Result Value Ref Range Status   SARS-CoV-2, NAA NOT DETECTED NOT DETECTED Final    Comment: (NOTE) This test was developed and its performance characteristics determined by [employer]. This test has not been FDA cleared or approved. This test has been authorized by FDA under an Emergency Use Authorization (EUA). This test is only authorized for the duration of time the declaration that circumstances exist justifying the authorization of the emergency use of Clark vitro diagnostic tests for detection of SARS-CoV-2 virus and/or diagnosis of COVID-19 infection under section 564(b)(1) of the Act, 21 U.S.C. [id number]), unless the authorization is terminated or revoked sooner. When diagnostic testing is negative, the possibility of a false negative result should be considered Clark the context of a patient's recent  exposures and the presence of clinical signs and symptoms consistent with COVID-19. An individual without symptoms of COVID-19 and who is not shedding SARS-CoV-2 virus would expect to have a negative (not detected) result Clark this assay. Performed  At: [facility] [address] [place], [place] [id number] Rush Farmer MD [id number]    [place]  Final    Comment: Performed at [facility], [address] [place], [place] [address]  MRSA PCR Screening     Status: None   Collection Time: 12/02/18  4:42 AM   Specimen: Nasal Mucosa; Nasopharyngeal  Result Value Ref Range Status   MRSA by PCR NEGATIVE NEGATIVE Final    Comment:        The GeneXpert MRSA Assay (FDA approved for NASAL specimens only), is one component of a comprehensive MRSA colonization surveillance program. It is not intended to diagnose MRSA infection nor to guide or monitor treatment for MRSA infections. Performed at [facility], [address] [place], [place] [address]   Novel Coronavirus, NAA (hospital order; send-out to ref lab)     Status: None   Collection Time: 12/06/18  8:45 PM   Specimen: Nasopharyngeal Swab; Respiratory  Result Value Ref Range Status   SARS-CoV-2, NAA NOT DETECTED NOT DETECTED Final    Comment: (NOTE) This nucleic acid amplification test was developed and its perfomance characteristics determined by [employer]. Nucleic acid amplification tests include PCR and TMA. This test has not been FDA cleared or approved. This test has been authorized by FDA under an Emergency Use Authorization (EUA). This test is only authorized for the duration of time the declaration that circumstances exist justifying the authorization of the emergency use of Clark vitro diagnostic tests for detection of SARS-CoV-2 virus and/or diagnosis of COVID-19 infection under section 564(b)(1) of the Act, 21 U.S.C. [id number]) (1), unless the  authorization is terminated or revoked sooner. When diagnostic testing is negative, the possibility of a false negative result should be considered Clark the context of a patient's recent exposures and the presence of clinical signs and symptoms consistent with COVID-19. An individual without symptoms of COVID- 19 and who is not shedding SARS-CoV-2 vir [place] would expect to have a negative (not detected) result Clark this assay. Performed At: [facility] [address] [place], [place] [id number] Rush Farmer MD [id number]    [place]  Final    Comment: Performed at [facility], [address] [place], [place] [address]  SARS Coronavirus 2 [facility] order, Performed Clark [facility] hospital lab) Nasopharyngeal Nasopharyngeal Swab     Status: None   Collection Time: 12/08/18 12:02 PM   Specimen: Nasopharyngeal  Swab  Result Value Ref Range Status   SARS Coronavirus 2 NEGATIVE NEGATIVE Final    Comment: (NOTE) If result is NEGATIVE SARS-CoV-2 target nucleic acids are NOT DETECTED. The SARS-CoV-2 RNA is generally detectable Clark upper and lower  respiratory specimens during the acute phase of infection. The lowest  concentration of SARS-CoV-2 viral copies this assay can detect is 250  copies / mL. A negative result does not preclude SARS-CoV-2 infection  and should not be used as the sole basis for treatment or other  patient management decisions.  A negative result may occur with  improper specimen collection / handling, submission of specimen other  than nasopharyngeal swab, presence of viral mutation(s) within the  areas targeted by this assay, and inadequate number of viral copies  (<250 copies / mL). A negative result must be combined with clinical  observations, patient history, and epidemiological information. If result is POSITIVE SARS-CoV-2 target nucleic acids are DETECTED. The SARS-CoV-2 RNA is generally detectable Clark upper and lower   respiratory specimens dur ing the acute phase of infection.  Positive  results are indicative of active infection with SARS-CoV-2.  Clinical  correlation with patient history and other diagnostic information is  necessary to determine patient infection status.  Positive results do  not rule out bacterial infection or co-infection with other viruses. If result is PRESUMPTIVE POSTIVE SARS-CoV-2 nucleic acids MAY BE PRESENT.   A presumptive positive result was obtained on the submitted specimen  and confirmed on repeat testing.  While 2019 novel coronavirus  (SARS-CoV-2) nucleic acids may be present Clark the submitted sample  additional confirmatory testing may be necessary for epidemiological  and / or clinical management purposes  to differentiate between  SARS-CoV-2 and other Sarbecovirus currently known to infect humans.  If clinically indicated additional testing with an alternate test  methodology [phone]) is advised. The SARS-CoV-2 RNA is generally  detectable Clark upper and lower respiratory sp ecimens during the acute  phase of infection. The expected result is Negative. Fact Sheet for Patients:  StrictlyIdeas.no Fact Sheet for Healthcare Providers: BankingDealers.co.za This test is not yet approved or cleared by the [place] FDA and has been authorized for detection and/or diagnosis of SARS-CoV-2 by FDA under an Emergency Use Authorization (EUA).  This EUA will remain Clark effect (meaning this test can be used) for the duration of the COVID-19 declaration under Section 564(b)(1) of the Act, 21 U.S.C. section 360bbb-3(b)(1), unless the authorization is terminated or revoked sooner. Performed at [facility], [address] [place], [place] [address]       Scheduled Meds: . feeding supplement (ENSURE ENLIVE)  237 mL Oral BID BM   Continuous Infusions:  Assessment/Plan:  1. 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  Asked nursing staff to try to construct a urostomy over his abdominal wound to collect the urine.  As needed pain medications. 2. Bilateral hydronephrosis and acute kidney injury.  Patient is now comfort care 3. Hypokalemia 4. Partial small bowel obstruction 5. BPH status post Foley 6. Chronic atrial fibrillation 7. Hypertension 8. PRN IV Dilaudid for severe pain, Roxanol for moderate pain.  Continue other comfort care measures. 9.   Repeat COVID-19 negative  Code Status:     Code Status Orders  (From admission, onward)         Start     Ordered   12/02/18 0404  Do not attempt resuscitation (DNR)  Continuous  Question Answer Comment  Clark the event of cardiac or respiratory ARREST Do not call a "code blue"   Clark the event of cardiac or respiratory ARREST Do not perform Intubation, CPR, defibrillation or ACLS   Clark the event of cardiac or respiratory ARREST Use medication by any route, position, wound care, and other measures to relive pain and suffering. May use oxygen, suction and manual treatment of airway obstruction as needed for comfort.   Comments DNI/DNR      12/02/18 0403        Code Status History    Date Active Date Inactive Code Status Order ID Comments User Context   [place] 2352 12/02/2018 0403 Full Code [id number]  Mansy, Arvella Merles, MD ED   11/10/2018 2157 11/23/2018 2334 Full Code [id number]  Saundra Shelling, MD Inpatient   10/19/2018 0114 10/21/2018 2305 Full Code [id number]  Lance Coon, MD ED   10/07/2018 0442 10/11/2018 1817 Full Code [id number]  Mayer Camel, NP ED   08/02/2018 1502 08/09/2018 1743 Full Code [id number]  Mayo, Pete Pelt, MD Inpatient   Advance Care Planning Activity    Advance Directive Documentation     Most Recent Value  Type of Advance Directive  Healthcare Power of Attorney  Pre-existing out of facility DNR order (yellow form or pink MOST form)  -  "MOST" Form Clark  Place?  -     Family Communication: Hospice home contacting legal guardian. Disposition Plan: Patient will be able to go to the hospice home today because it is out of [place] and after 3:00.  We will have to go tomorrow.  Time spent: 35 minutes  [place]

## 2018-12-08 NOTE — Progress Notes (Signed)
Palliative: Ruben Clark, Ruben Clark, is resting quietly in bed. He denies pain at this time. He declines offers of food or drink.  Conference with nursing staff related to patient condition, needs, symptom management.    Plan: Comfort and dignity at end of life, residential hospice with [facility].    66 minutes Quinn Axe, NP Palliative Medicine Team Team Phone # [phone] Greater than 50% of this time was spent counseling and coordinating care related to the above assessment and plan.

## 2018-12-08 NOTE — Discharge Summary (Addendum)
[facility] [place] NAME: Marks Langolf    MR#:  [id number]  DATE OF BIRTH:  [birth year]  DATE OF ADMISSION:  12/01/2018 ADMITTING PHYSICIAN: Christel Mormon, MD  DATE OF DISCHARGE: 12/09/2018  PRIMARY CARE PHYSICIAN: System, Pcp Not In    ADMISSION DIAGNOSIS:  Intra-abdominal infection [B99.9] Hydronephrosis, unspecified hydronephrosis type [N13.30]  DISCHARGE DIAGNOSIS:  Active Problems:   Bilateral hydronephrosis   Pressure injury of skin   Goals of care, counseling/discussion   Palliative care by specialist   Encounter for hospice care discussion   SECONDARY DIAGNOSIS:   Past Medical History:  Diagnosis Date  . CHF (congestive heart failure) ([place])   . Chronic kidney disease (CKD), stage III (moderate) (H[facility])   . Hypertension   . Renal insufficiency     HOSPITAL COURSE:   1.  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  Palliative care saw the patient and spoke with the guardian and the patient was made comfort care.  Antibiotics were discontinued.  I asked the nursing staff to construct a urostomy over his abdominal wound to collect the urine so he can stay dry.  As needed IV Dilaudid, oral Roxanol, IV Haldol and IV Zofran.  Patient will be transferred to the hospice home once COVID test negative.  Full comfort care measures.  Diet as tolerated. 2.  Bilateral hydronephrosis and acute kidney injury.  Patient is now comfort care so I have not checked any further labs 3.  Hypokalemia 4.  Partial small bowel obstruction 5.  BPH with urinary obstruction status post Foley catheter.  Foley care at facility. 6.  Chronic atrial fibrillation 7.  Hypertension 8.  Repeat COVID testing sent off and should be back today. 9.  Leave IV in to go to hospice home 10.  No signs of congestive heart failure on this  hospitalization. Hx of diastolic congestive heart failure. 11. Patient pulled out iv and will send to hospice home without iv  DISCHARGE CONDITIONS:   Guarded  CONSULTS OBTAINED:  Treatment Team:  Hollice Espy, MD  DRUG ALLERGIES:   Allergies  Allergen Reactions  . 5-Alpha Reductase Inhibitors     DISCHARGE MEDICATIONS:   Allergies as of 12/09/2018      Reactions   5-alpha Reductase Inhibitors       Medication List    STOP taking these medications   apixaban 5 MG Tabs tablet Commonly known as: ELIQUIS   aspirin EC 81 MG tablet   bisacodyl 5 MG EC tablet Commonly known as: DULCOLAX   digoxin 0.25 MG tablet Commonly known as: LANOXIN   docusate sodium 100 MG capsule Commonly known as: COLACE   doxycycline 100 MG capsule Commonly known as: VIBRAMYCIN   feeding supplement (PRO-STAT SUGAR FREE 64) Liqd   finasteride 5 MG tablet Commonly known as: PROSCAR   gabapentin 300 MG capsule Commonly known as: NEURONTIN   hydrOXYzine 25 MG tablet Commonly known as: ATARAX/VISTARIL   loratadine 10 MG tablet Commonly known as: CLARITIN   LORazepam 0.5 MG tablet Commonly known as: ATIVAN   metoprolol succinate 25 MG 24 hr tablet Commonly known as: TOPROL-XL   multivitamin with minerals Tabs tablet   senna 8.6 MG Tabs tablet Commonly known as: SENOKOT   vancomycin  IVPB     TAKE these medications   acetaminophen 500 MG tablet Commonly known as:  TYLENOL Take 1 tablet (500 mg total) by mouth every 4 (four) hours as needed. What changed: Another medication with the same name was added. Make sure you understand how and when to take each.   acetaminophen 650 MG suppository Commonly known as: TYLENOL Place 1 suppository (650 mg total) rectally every 6 (six) hours as needed for mild pain (or Fever >/= 101). What changed: You were already taking a medication with the same name, and this prescription was added. Make sure you understand how and when to take  each.   atropine 1 % ophthalmic solution Place 2 drops under the tongue 4 (four) times daily as needed (increased oral secretions).   feeding supplement (ENSURE ENLIVE) Liqd Take 237 mLs by mouth 2 (two) times daily between meals.   haloperidol 1 MG tablet Commonly known as: HALDOL Take 1 tablet (1 mg total) by mouth every 6 (six) hours as needed for agitation.   HYDROmorphone 2 MG tablet Commonly known as: Dilaudid Take 1 tablet (2 mg total) by mouth every 2 (two) hours as needed for up to 5 days for severe pain.   morphine CONCENTRATE 10 MG/0.5ML Soln concentrated solution Take 0.5 mLs (10 mg total) by mouth every 2 (two) hours as needed for moderate pain.   ondansetron 4 MG/2ML Soln injection Commonly known as: ZOFRAN Inject 2 mLs (4 mg total) into the vein every 6 (six) hours as needed for nausea.   polyvinyl alcohol 1.4 % ophthalmic solution Commonly known as: LIQUIFILM TEARS Place 2 drops into both eyes as needed for dry eyes.        DISCHARGE INSTRUCTIONS:   Discharge to hospice home once COVID test negative.  If you experience worsening of your admission symptoms, develop shortness of breath, life threatening emergency, suicidal or homicidal thoughts you must seek medical attention immediately by calling 911 or calling your MD immediately  if symptoms less severe.  You Must read complete instructions/literature along with all the possible adverse reactions/side effects for all the Medicines you take and that have been prescribed to you. Take any new Medicines after you have completely understood and accept all the possible adverse reactions/side effects.   Please note  You were cared for by a hospitalist during your hospital stay. If you have any questions about your discharge medications or the care you received while you were in the hospital after you are discharged, you can call the unit and asked to speak with the hospitalist on call if the hospitalist that took  care of you is not available. Once you are discharged, your primary care physician will handle any further medical issues. Please note that NO REFILLS for any discharge medications will be authorized once you are discharged, as it is imperative that you return to your primary care physician (or establish a relationship with a primary care physician if you do not have one) for your aftercare needs so that they can reassess your need for medications and monitor your lab values.    Today   CHIEF COMPLAINT:   Chief Complaint  Patient presents with  . Abnormal Lab    HISTORY OF PRESENT ILLNESS:  Makaveli Perezgonzalez  is a 63 y.o. male with a known history of sent in for abnormal lab   VITAL SIGNS:  Blood pressure 122/67, pulse 83, temperature (!) 97.4 F (36.3 C), resp. rate (!) 24, height 5\' 8"  (1.727 m), weight 72.5 kg, SpO2 100 %.   PHYSICAL EXAMINATION:  GENERAL:  63 y.o.-year-old patient lying  in the bed with no acute distress.  EYES: Pupils equal, round, reactive to light and accommodation. No scleral icterus.  HEENT: Head atraumatic, normocephalic. Oropharynx and nasopharynx clear.  NECK:  Supple, no jugular venous distention. No thyroid enlargement, no tenderness.  LUNGS: Normal breath sounds bilaterally, no wheezing, rales,rhonchi or crepitation. No use of accessory muscles of respiration.  CARDIOVASCULAR: S1, S2 normal. No murmurs, rubs, or gallops.  ABDOMEN: Soft, non-tender, non-distended. Bowel sounds present. Infiltrating mass through abdominal wall. EXTREMITIES: No pedal edema, cyanosis, or clubbing.  NEUROLOGIC: Cranial nerves II through XII are intact. PSYCHIATRIC: The patient is alert on 12/09/2018.  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   DATA REVIEW:   CBC Recent Labs  Lab 12/03/18 0651  WBC 9.5  HGB 7.0*  HCT 23.1*  PLT 435*    Chemistries  Recent Labs  Lab 12/03/18 0651  CREATININE 2.19*    Cardiac Enzymes No results for input(s):  TROPONINI in the last 168 hours.  Microbiology Results  Results for orders placed or performed during the hospital encounter of 12/01/18  Urine culture     Status: Abnormal   Collection Time: 12/01/18  3:30 PM   Specimen: Urine, Random  Result Value Ref Range Status   Specimen Description   Final    URINE, RANDOM Performed at [facility], [address] [place], [place] [address]    Special Requests   Final    NONE Performed at [facility], [address], [place], [place] [address]    Culture (A)  Final    <10,000 COLONIES/mL INSIGNIFICANT GROWTH Performed at [facility], [place] [address] [place], [place] [address]    Report Status 12/02/2018 FINAL  Final  Novel Coronavirus, NAA (Hosp order, Send-out to Ref Lab; TAT 18-24 hrs     Status: None   Collection Time: 12/01/18  3:31 PM   Specimen: Respiratory  Result Value Ref Range Status   SARS-CoV-2, NAA NOT DETECTED NOT DETECTED Final    Comment: (NOTE) This test was developed and its performance characteristics determined by [employer]. This test has not been FDA cleared or approved. This test has been authorized by FDA under an Emergency Use Authorization (EUA). This test is only authorized for the duration of time the declaration that circumstances exist justifying the authorization of the emergency use of in vitro diagnostic tests for detection of SARS-CoV-2 virus and/or diagnosis of COVID-19 infection under section 564(b)(1) of the Act, 21 U.S.C. [id number]), unless the authorization is terminated or revoked sooner. When diagnostic testing is negative, the possibility of a false negative result should be considered in the context of a patient's recent exposures and the presence of clinical signs and symptoms consistent with COVID-19. An individual without symptoms of COVID-19 and who is not shedding SARS-CoV-2 virus would expect to have a negative (not detected) result in this  assay. Performed  At: [facility] [address] [place], [place] [id number] Rush Farmer MD [id number]    [place]  Final    Comment: Performed at [facility], [address] [place], [place] [address]  MRSA PCR Screening     Status: None   Collection Time: 12/02/18  4:42 AM   Specimen: Nasal Mucosa; Nasopharyngeal  Result Value Ref Range Status   MRSA by PCR NEGATIVE NEGATIVE Final    Comment:        The GeneXpert MRSA Assay (FDA approved for NASAL specimens only), is one component of  a comprehensive MRSA colonization surveillance program. It is not intended to diagnose MRSA infection nor to guide or monitor treatment for MRSA infections. Performed at [facility], [address] [place], [place] [address]   Novel Coronavirus, NAA (hospital order; send-out to ref lab)     Status: None   Collection Time: 12/06/18  8:45 PM   Specimen: Nasopharyngeal Swab; Respiratory  Result Value Ref Range Status   SARS-CoV-2, NAA NOT DETECTED NOT DETECTED Final    Comment: (NOTE) This nucleic acid amplification test was developed and its perfomance characteristics determined by [employer]. Nucleic acid amplification tests include PCR and TMA. This test has not been FDA cleared or approved. This test has been authorized by FDA under an Emergency Use Authorization (EUA). This test is only authorized for the duration of time the declaration that circumstances exist justifying the authorization of the emergency use of in vitro diagnostic tests for detection of SARS-CoV-2 virus and/or diagnosis of COVID-19 infection under section 564(b)(1) of the Act, 21 U.S.C. [id number]) (1), unless the authorization is terminated or revoked sooner. When diagnostic testing is negative, the possibility of a false negative result should be considered in the context of a patient's recent exposures and the presence of clinical signs and  symptoms consistent with COVID-19. An individual without symptoms of COVID- 19 and who is not shedding SARS-CoV-2 vir [place] would expect to have a negative (not detected) result in this assay. Performed At: [facility] [address] [place], [place] [id number] Rush Farmer MD [id number]    [place]  Final    Comment: Performed at [facility], [address] [place], [place] [address]  SARS Coronavirus 2 [facility] order, Performed in [facility] hospital lab) Nasopharyngeal Nasopharyngeal Swab     Status: None   Collection Time: 12/08/18 12:02 PM   Specimen: Nasopharyngeal Swab  Result Value Ref Range Status   SARS Coronavirus 2 NEGATIVE NEGATIVE Final    Comment: (NOTE) If result is NEGATIVE SARS-CoV-2 target nucleic acids are NOT DETECTED. The SARS-CoV-2 RNA is generally detectable in upper and lower  respiratory specimens during the acute phase of infection. The lowest  concentration of SARS-CoV-2 viral copies this assay can detect is 250  copies / mL. A negative result does not preclude SARS-CoV-2 infection  and should not be used as the sole basis for treatment or other  patient management decisions.  A negative result may occur with  improper specimen collection / handling, submission of specimen other  than nasopharyngeal swab, presence of viral mutation(s) within the  areas targeted by this assay, and inadequate number of viral copies  (<250 copies / mL). A negative result must be combined with clinical  observations, patient history, and epidemiological information. If result is POSITIVE SARS-CoV-2 target nucleic acids are DETECTED. The SARS-CoV-2 RNA is generally detectable in upper and lower  respiratory specimens dur ing the acute phase of infection.  Positive  results are indicative of active infection with SARS-CoV-2.  Clinical  correlation with patient history and other diagnostic information is  necessary to  determine patient infection status.  Positive results do  not rule out bacterial infection or co-infection with other viruses. If result is PRESUMPTIVE POSTIVE SARS-CoV-2 nucleic acids MAY BE PRESENT.   A presumptive positive result was obtained on the submitted specimen  and confirmed on repeat testing.  While 2019 novel coronavirus  (SARS-CoV-2) nucleic acids may be present in the submitted sample  additional confirmatory testing may be necessary  for epidemiological  and / or clinical management purposes  to differentiate between  SARS-CoV-2 and other Sarbecovirus currently known to infect humans.  If clinically indicated additional testing with an alternate test  methodology [phone]) is advised. The SARS-CoV-2 RNA is generally  detectable in upper and lower respiratory sp ecimens during the acute  phase of infection. The expected result is Negative. Fact Sheet for Patients:  StrictlyIdeas.no Fact Sheet for Healthcare Providers: BankingDealers.co.za This test is not yet approved or cleared by the [place] FDA and has been authorized for detection and/or diagnosis of SARS-CoV-2 by FDA under an Emergency Use Authorization (EUA).  This EUA will remain in effect (meaning this test can be used) for the duration of the COVID-19 declaration under Section 564(b)(1) of the Act, 21 U.S.C. section 360bbb-3(b)(1), unless the authorization is terminated or revoked sooner. Performed at [facility], [address], [place], [place] [address]        Management plans discussed with the patient, and he is in agreement.  CODE STATUS:     Code Status Orders  (From admission, onward)         Start     Ordered   12/02/18 0404  Do not attempt resuscitation (DNR)  Continuous    Question Answer Comment  In the event of cardiac or respiratory ARREST Do not call a "code blue"   In the event of cardiac or respiratory ARREST Do not  perform Intubation, CPR, defibrillation or ACLS   In the event of cardiac or respiratory ARREST Use medication by any route, position, wound care, and other measures to relive pain and suffering. May use oxygen, suction and manual treatment of airway obstruction as needed for comfort.   Comments DNI/DNR      12/02/18 0403        Code Status History    Date Active Date Inactive Code Status Order ID Comments User Context   12/01/2018 2352 12/02/2018 0403 Full Code [id number]  Sidney Ace Arvella Merles, MD ED   11/10/2018 2157 11/23/2018 2334 Full Code [id number]  Saundra Shelling, MD Inpatient   10/19/2018 0114 10/21/2018 2305 Full Code [id number]  Lance Coon, MD ED   10/07/2018 0442 10/11/2018 1817 Full Code [id number]  Mayer Camel, NP ED   08/02/2018 1502 08/09/2018 1743 Full Code [id number]  Mayo, Pete Pelt, MD Inpatient   Advance Care Planning Activity    Advance Directive Documentation     Most Recent Value  Type of Advance Directive  Healthcare Power of Attorney  Pre-existing out of facility DNR order (yellow form or pink MOST form)  -  "MOST" Form in Place?  -      TOTAL TIME TAKING CARE OF THIS PATIENT: 31 minutes.    Loletha Grayer M.D on 12/09/2018 at 9:29 AM  Between 7am to 6pm - Pager - [phone]  After 6pm go to [email or web address] - password [email or web address]  [employer] Office  [phone]  CC: Primary care physician; System, Pcp Not In

## 2018-12-08 NOTE — TOC Progression Note (Signed)
Transition of Care [facility]) - Progression Note    Patient Details  Name: Ruben Clark MRN: [id number] Date of Birth: August 04, 1955  Transition of Care [facility]) CM/SW Contact  Beverly Sessions, RN Phone Number: 12/08/2018, 3:26 PM  Clinical Narrative:     Covid results are negative.  Message left for Cassandra at [facility] home of [facility].    Awaiting return call       Expected Discharge Plan and Services           Expected Discharge Date: 12/08/18                                     Social Determinants of Health (SDOH) Interventions    Readmission Risk Interventions Readmission Risk Prevention Plan 12/03/2018 10/10/2018  Post Dischage Appt - Complete  Medication Screening - Complete  Transportation Screening Complete Complete  Palliative Care Screening Complete -  Medication Review (RN Care Manager) Complete -

## 2018-12-09 ENCOUNTER — Inpatient Hospital Stay: Payer: Self-pay

## 2018-12-09 MED ORDER — HALOPERIDOL 1 MG PO TABS: 1.0000 mg | ORAL_TABLET | Freq: Four times a day (QID) | ORAL | 2 refills | Status: AC | PRN

## 2018-12-09 MED ORDER — HYDROMORPHONE HCL 2 MG PO TABS: 2.0000 mg | ORAL_TABLET | ORAL | 0 refills | Status: AC | PRN

## 2018-12-09 NOTE — TOC Progression Note (Addendum)
Transition of Care [facility]) - Progression Note    Patient Details  Name: Ruben Clark MRN: [id number] Date of Birth: Dec 12, 1955  [facility]) CM/SW Contact  Beverly Sessions, RN Phone Number: 12/09/2018, 10:53 AM  Clinical Narrative:    Received called from Gillis at [facility].  She has faxed all the required paper work to patients guardian. Once she has completed paper work, she will contact RNCM to notify when it is ok to transport patient.   EMS packet on chart. With signed DNR    Update:  RNCM spoke with guardian, and he states that he has completed the paperwork and sent it back         Expected Discharge Plan and Services           Expected Discharge Date: 12/09/18                                     Social Determinants of Health (SDOH) Interventions    Readmission Risk Interventions Readmission Risk Prevention Plan 12/03/2018 10/10/2018  Post Dischage Appt - Complete  Medication Screening - Complete  Transportation Screening Complete Complete  Palliative Care Screening Complete -  Medication Review (RN Care Manager) Complete -

## 2018-12-09 NOTE — TOC Transition Note (Signed)
Transition of Care [facility]) - CM/SW Discharge Note   Patient Details  Name: Ruben Clark MRN: [id number] Date of Birth: 01/30/1956  Transition of Care [facility]) CM/SW Contact:  Beverly Sessions, RN Phone Number: 12/09/2018, 2:53 PM   Clinical Narrative:    Patient to discharge today to [facility].  Legal guardian has completed all admission paper work, and was notified of discharge  EMS packet and DNR on chart Bedside RN notified    Final next level of care: [place] Barriers to Discharge: No Barriers Identified   Patient Goals and CMS Choice        Discharge Placement              Patient chooses bed at: Other - please specify in the comment section below:([facility]ospice home of [facility])   Name of family member notified: [name] Legal Gaurdian Patient and family notified of of transfer: 12/09/18  Discharge Plan and Services                                     Social Determinants of Health (SDOH) Interventions     Readmission Risk Interventions Readmission Risk Prevention Plan 12/03/2018 10/10/2018  Post Dischage Appt - Complete  Medication Screening - Complete  Transportation Screening Complete Complete  Palliative Care Screening Complete -  Medication Review (RN Care Manager) Complete -

## 2018-12-09 NOTE — Care Management Important Message (Signed)
Important Message  Patient Details  Name: Ruben Clark MRN: [id number] Date of Birth: [birth year]   Medicare Important Message Given:  Yes  Legal guardian aware of Medicare IM right.    Dannette Barbara 12/09/2018, 12:05 PM

## 2018-12-09 NOTE — Progress Notes (Signed)
Walker Shadow to be D/C'd [facility] per MD order.  Discussed prescriptions and follow up appointments with the patient. Prescriptions given to patient, medication list explained in detail. Pt verbalized understanding.  Allergies as of 12/09/2018      Reactions   5-alpha Reductase Inhibitors       Medication List    STOP taking these medications   apixaban 5 MG Tabs tablet Commonly known as: ELIQUIS   aspirin EC 81 MG tablet   bisacodyl 5 MG EC tablet Commonly known as: DULCOLAX   digoxin 0.25 MG tablet Commonly known as: LANOXIN   docusate sodium 100 MG capsule Commonly known as: COLACE   doxycycline 100 MG capsule Commonly known as: VIBRAMYCIN   feeding supplement (PRO-STAT SUGAR FREE 64) Liqd   finasteride 5 MG tablet Commonly known as: PROSCAR   gabapentin 300 MG capsule Commonly known as: NEURONTIN   hydrOXYzine 25 MG tablet Commonly known as: ATARAX/VISTARIL   loratadine 10 MG tablet Commonly known as: CLARITIN   LORazepam 0.5 MG tablet Commonly known as: ATIVAN   metoprolol succinate 25 MG 24 hr tablet Commonly known as: TOPROL-XL   multivitamin with minerals Tabs tablet   senna 8.6 MG Tabs tablet Commonly known as: SENOKOT   vancomycin  IVPB     TAKE these medications   acetaminophen 500 MG tablet Commonly known as: TYLENOL Take 1 tablet (500 mg total) by mouth every 4 (four) hours as needed. What changed: Another medication with the same name was added. Make sure you understand how and when to take each.   acetaminophen 650 MG suppository Commonly known as: TYLENOL Place 1 suppository (650 mg total) rectally every 6 (six) hours as needed for mild pain (or Fever >/= 101). What changed: You were already taking a medication with the same name, and this prescription was added. Make sure you understand how and when to take each.   atropine 1 % ophthalmic solution Place 2 drops under the tongue 4 (four) times daily as needed (increased  oral secretions).   feeding supplement (ENSURE ENLIVE) Liqd Take 237 mLs by mouth 2 (two) times daily between meals.   haloperidol 1 MG tablet Commonly known as: HALDOL Take 1 tablet (1 mg total) by mouth every 6 (six) hours as needed for agitation.   HYDROmorphone 2 MG tablet Commonly known as: Dilaudid Take 1 tablet (2 mg total) by mouth every 2 (two) hours as needed for up to 5 days for severe pain.   morphine CONCENTRATE 10 MG/0.5ML Soln concentrated solution Take 0.5 mLs (10 mg total) by mouth every 2 (two) hours as needed for moderate pain.   ondansetron 4 MG/2ML Soln injection Commonly known as: ZOFRAN Inject 2 mLs (4 mg total) into the vein every 6 (six) hours as needed for nausea.   polyvinyl alcohol 1.4 % ophthalmic solution Commonly known as: LIQUIFILM TEARS Place 2 drops into both eyes as needed for dry eyes.       Vitals:   12/07/18 2146 12/07/18 2148  BP: 122/67 122/67  Pulse: 76 83  Resp: (!) 24 (!) 24  Temp: (!) 97.4 F (36.3 C) (!) 97.4 F (36.3 C)  SpO2: 100% 100%    Skin clean, dry and intact without evidence of skin break down, no evidence of skin tears noted. IV catheter discontinued intact. Site without signs and symptoms of complications. Dressing and pressure applied. Pt denies pain at this time. No complaints noted.  An After Visit Summary was printed and given  to the patient. Patient escorted via stretcher via non-emergent EMS.  Chuck Hint RN [facility] [employer]

## 2018-12-09 NOTE — Progress Notes (Signed)
Patient ID: Ruben Clark, male   DOB: [birth year], 63 y.o.   MRN: [id number]  [facility] PROGRESS NOTE  Ruben Clark [id number] DOB: January 13, 1956 DOA: 12/01/2018 PCP: System, Pcp Not In  HPI/Subjective: Patient more alert.  Interested in getting out of the hospital today.  Mentioned that he will be going back to his group home because he needs more care.  Patient not complaining of any pain.    Objective: Vitals:   12/07/18 2146 12/07/18 2148  BP: 122/67 122/67  Pulse: 76 83  Resp: (!) 24 (!) 24  Temp: (!) 97.4 F (36.3 C) (!) 97.4 F (36.3 C)  SpO2: 100% 100%    Filed Weights   12/01/18 1152  Weight: 72.5 kg    ROS: Review of Systems  Constitutional: Negative for chills and fever.  Eyes: Negative for blurred vision.  Respiratory: Negative for cough, shortness of breath and wheezing.   Cardiovascular: Negative for chest pain.  Gastrointestinal: Negative for abdominal pain, constipation, diarrhea, nausea and vomiting.  Genitourinary: Negative for dysuria.  Musculoskeletal: Negative for joint pain.  Neurological: Negative for dizziness and headaches.   Exam: Physical Exam  HENT:  Nose: No mucosal edema.  Mouth/Throat: No oropharyngeal exudate or posterior oropharyngeal edema.  Eyes: Pupils are equal, round, and reactive to light. Conjunctivae, EOM and lids are normal.  Neck: No JVD present. Carotid bruit is not present. No edema present. No thyroid mass and no thyromegaly present.  Cardiovascular: S1 normal and S2 normal. Exam reveals no gallop.  No murmur heard. Pulses:      Dorsalis pedis pulses are 2+ on the right side and 2+ on the left side.  Respiratory: No respiratory distress. He has no wheezes. He has no rhonchi. He has no rales.  GI: Soft. Bowel sounds are normal. There is no abdominal tenderness.  Mass piercing through the abdominal wall.  Musculoskeletal:     Right ankle: He exhibits no swelling.     Left ankle: He exhibits no swelling.   Lymphadenopathy:    He has no cervical adenopathy.  Neurological: He is alert.  Skin: Skin is warm. No rash noted. Nails show no clubbing.  Psychiatric: He has a normal mood and affect.      Data Reviewed: Basic Metabolic Panel: Recent Labs  Lab 12/03/18 0651  CREATININE 2.19*   Liver Function Tests: No results for input(s): AST, ALT, ALKPHOS, BILITOT, PROT, ALBUMIN in the last 168 hours.  CBC: Recent Labs  Lab 12/03/18 0651  WBC 9.5  HGB 7.0*  HCT 23.1*  MCV 92.8  PLT 435*   BNP (last 3 results) Recent Labs    11/10/18 1629  BNP 81.0      Recent Results (from the past 240 hour(s))  Urine culture     Status: Abnormal   Collection Time: 12/01/18  3:30 PM   Specimen: Urine, Random  Result Value Ref Range Status   Specimen Description   Final    URINE, RANDOM Performed at [facility], [address] [place], [place] [address]    Special Requests   Final    NONE Performed at [facility], [address] [place], [place] [address]    Culture (A)  Final    <10,000 COLONIES/mL INSIGNIFICANT GROWTH Performed at [facility] [address] [place], [place] [address]    Report Status 12/02/2018 FINAL  Final  Novel Coronavirus, NAA (Hosp order, Send-out to Ref Lab; TAT 18-24 hrs     Status: None  Collection Time: 12/01/18  3:31 PM   Specimen: Respiratory  Result Value Ref Range Status   SARS-CoV-2, NAA NOT DETECTED NOT DETECTED Final    Comment: (NOTE) This test was developed and its performance characteristics determined by [employer]. This test has not been FDA cleared or approved. This test has been authorized by FDA under an Emergency Use Authorization (EUA). This test is only authorized for the duration of time the declaration that circumstances exist justifying the authorization of the emergency use of in vitro diagnostic tests for detection of SARS-CoV-2 virus and/or diagnosis of COVID-19 infection under  section 564(b)(1) of the Act, 21 U.S.C. [id number]), unless the authorization is terminated or revoked sooner. When diagnostic testing is negative, the possibility of a false negative result should be considered in the context of a patient's recent exposures and the presence of clinical signs and symptoms consistent with COVID-19. An individual without symptoms of COVID-19 and who is not shedding SARS-CoV-2 virus would expect to have a negative (not detected) result in this assay. Performed  At: [facility] [address] [place], [place] [id number] Rush Farmer MD [id number]    [place]  Final    Comment: Performed at [facility], [address] [place], [place] [address]  MRSA PCR Screening     Status: None   Collection Time: 12/02/18  4:42 AM   Specimen: Nasal Mucosa; Nasopharyngeal  Result Value Ref Range Status   MRSA by PCR NEGATIVE NEGATIVE Final    Comment:        The GeneXpert MRSA Assay (FDA approved for NASAL specimens only), is one component of a comprehensive MRSA colonization surveillance program. It is not intended to diagnose MRSA infection nor to guide or monitor treatment for MRSA infections. Performed at [facility], [address] [place], [place] [address]   Novel Coronavirus, NAA (hospital order; send-out to ref lab)     Status: None   Collection Time: 12/06/18  8:45 PM   Specimen: Nasopharyngeal Swab; Respiratory  Result Value Ref Range Status   SARS-CoV-2, NAA NOT DETECTED NOT DETECTED Final    Comment: (NOTE) This nucleic acid amplification test was developed and its perfomance characteristics determined by [employer]. Nucleic acid amplification tests include PCR and TMA. This test has not been FDA cleared or approved. This test has been authorized by FDA under an Emergency Use Authorization (EUA). This test is only authorized for the duration of time the declaration that  circumstances exist justifying the authorization of the emergency use of in vitro diagnostic tests for detection of SARS-CoV-2 virus and/or diagnosis of COVID-19 infection under section 564(b)(1) of the Act, 21 U.S.C. [id number]) (1), unless the authorization is terminated or revoked sooner. When diagnostic testing is negative, the possibility of a false negative result should be considered in the context of a patient's recent exposures and the presence of clinical signs and symptoms consistent with COVID-19. An individual without symptoms of COVID- 19 and who is not shedding SARS-CoV-2 vir [place] would expect to have a negative (not detected) result in this assay. Performed At: [facility] [address] [place], [place] [id number] Rush Farmer MD [id number]    [place]  Final    Comment: Performed at [facility], [address] [place], [place] [address]  SARS Coronavirus 2 [facility] order, Performed in [facility] hospital lab) Nasopharyngeal Nasopharyngeal Swab     Status: None   Collection Time: 12/08/18 12:02 PM   Specimen: Nasopharyngeal  Swab  Result Value Ref Range Status   SARS Coronavirus 2 NEGATIVE NEGATIVE Final    Comment: (NOTE) If result is NEGATIVE SARS-CoV-2 target nucleic acids are NOT DETECTED. The SARS-CoV-2 RNA is generally detectable in upper and lower  respiratory specimens during the acute phase of infection. The lowest  concentration of SARS-CoV-2 viral copies this assay can detect is 250  copies / mL. A negative result does not preclude SARS-CoV-2 infection  and should not be used as the sole basis for treatment or other  patient management decisions.  A negative result may occur with  improper specimen collection / handling, submission of specimen other  than nasopharyngeal swab, presence of viral mutation(s) within the  areas targeted by this assay, and inadequate number of viral copies  (<250 copies / mL).  A negative result must be combined with clinical  observations, patient history, and epidemiological information. If result is POSITIVE SARS-CoV-2 target nucleic acids are DETECTED. The SARS-CoV-2 RNA is generally detectable in upper and lower  respiratory specimens dur ing the acute phase of infection.  Positive  results are indicative of active infection with SARS-CoV-2.  Clinical  correlation with patient history and other diagnostic information is  necessary to determine patient infection status.  Positive results do  not rule out bacterial infection or co-infection with other viruses. If result is PRESUMPTIVE POSTIVE SARS-CoV-2 nucleic acids MAY BE PRESENT.   A presumptive positive result was obtained on the submitted specimen  and confirmed on repeat testing.  While 2019 novel coronavirus  (SARS-CoV-2) nucleic acids may be present in the submitted sample  additional confirmatory testing may be necessary for epidemiological  and / or clinical management purposes  to differentiate between  SARS-CoV-2 and other Sarbecovirus currently known to infect humans.  If clinically indicated additional testing with an alternate test  methodology [phone]) is advised. The SARS-CoV-2 RNA is generally  detectable in upper and lower respiratory sp ecimens during the acute  phase of infection. The expected result is Negative. Fact Sheet for Patients:  StrictlyIdeas.no Fact Sheet for Healthcare Providers: BankingDealers.co.za This test is not yet approved or cleared by the [place] FDA and has been authorized for detection and/or diagnosis of SARS-CoV-2 by FDA under an Emergency Use Authorization (EUA).  This EUA will remain in effect (meaning this test can be used) for the duration of the COVID-19 declaration under Section 564(b)(1) of the Act, 21 U.S.C. section 360bbb-3(b)(1), unless the authorization is terminated or revoked  sooner. Performed at [facility], [address] [place], [place] [address]       Scheduled Meds: . feeding supplement (ENSURE ENLIVE)  237 mL Oral BID BM   Continuous Infusions:  Assessment/Plan:  1. 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  urostomy over his abdominal wound to collect the urine.  As needed pain medications. 2. Bilateral hydronephrosis and acute kidney injury.  Patient is now comfort care 3. Hypokalemia 4. Partial small bowel obstruction 5. BPH status post Foley 6. Chronic atrial fibrillation 7. Hypertension 8. PRN IV Dilaudid for severe pain, Roxanol for moderate pain.  Continue other comfort care measures. 9.   Repeat COVID-19 negative  Code Status:     Code Status Orders  (From admission, onward)         Start     Ordered   12/02/18 0404  Do not attempt resuscitation (DNR)  Continuous    Question Answer Comment  In  the event of cardiac or respiratory ARREST Do not call a "code blue"   In the event of cardiac or respiratory ARREST Do not perform Intubation, CPR, defibrillation or ACLS   In the event of cardiac or respiratory ARREST Use medication by any route, position, wound care, and other measures to relive pain and suffering. May use oxygen, suction and manual treatment of airway obstruction as needed for comfort.   Comments DNI/DNR      12/02/18 0403        Code Status History    Date Active Date Inactive Code Status Order ID Comments User Context   12/01/2018 2352 12/02/2018 0403 Full Code [id number]  Mansy, Arvella Merles, MD ED   11/10/2018 2157 11/23/2018 2334 Full Code [id number]  Saundra Shelling, MD Inpatient   10/19/2018 0114 10/21/2018 2305 Full Code [id number]  Lance Coon, MD ED   10/07/2018 0442 10/11/2018 1817 Full Code [id number]  Mayer Camel, NP ED   08/02/2018 1502 08/09/2018 1743 Full Code [id number]  Mayo, Pete Pelt, MD Inpatient   Advance Care Planning  Activity    Advance Directive Documentation     Most Recent Value  Type of Advance Directive  Healthcare Power of Attorney  Pre-existing out of facility DNR order (yellow form or pink MOST form)  -  "MOST" Form in Place?  -     Family Communication: Hospice home contacting legal guardian. Disposition Plan: Hospice home today.  Care manager to look into whether we can do IV meds at the hospice home.  Time spent: 31 minutes  [place]

## 2018-12-10 DIAGNOSIS — C679 Malignant neoplasm of bladder, unspecified: Secondary | ICD-10-CM | POA: Diagnosis not present

## 2018-12-16 DIAGNOSIS — R404 Transient alteration of awareness: Secondary | ICD-10-CM | POA: Diagnosis not present

## 2019-01-02 DIAGNOSIS — M86172 Other acute osteomyelitis, left ankle and foot: Secondary | ICD-10-CM | POA: Diagnosis not present

## 2019-01-02 DIAGNOSIS — Z89422 Acquired absence of other left toe(s): Secondary | ICD-10-CM | POA: Diagnosis not present

## 2019-01-02 DIAGNOSIS — Z4781 Encounter for orthopedic aftercare following surgical amputation: Secondary | ICD-10-CM | POA: Diagnosis not present

## 2019-01-06 DIAGNOSIS — 419620001 Death: Secondary | SNOMED CT | POA: Diagnosis not present

## 2019-01-06 DEATH — deceased

## 2021-03-12 IMAGING — CR LEFT FEMUR 2 VIEWS
4 series · 4 of 4 positions shown · non-contrast
Comparison: None.

CLINICAL DATA: 63-year-old male status post fall with left hip and
thigh pain.

EXAM:
LEFT FEMUR 2 VIEWS

[femur ap (1 of 2)]
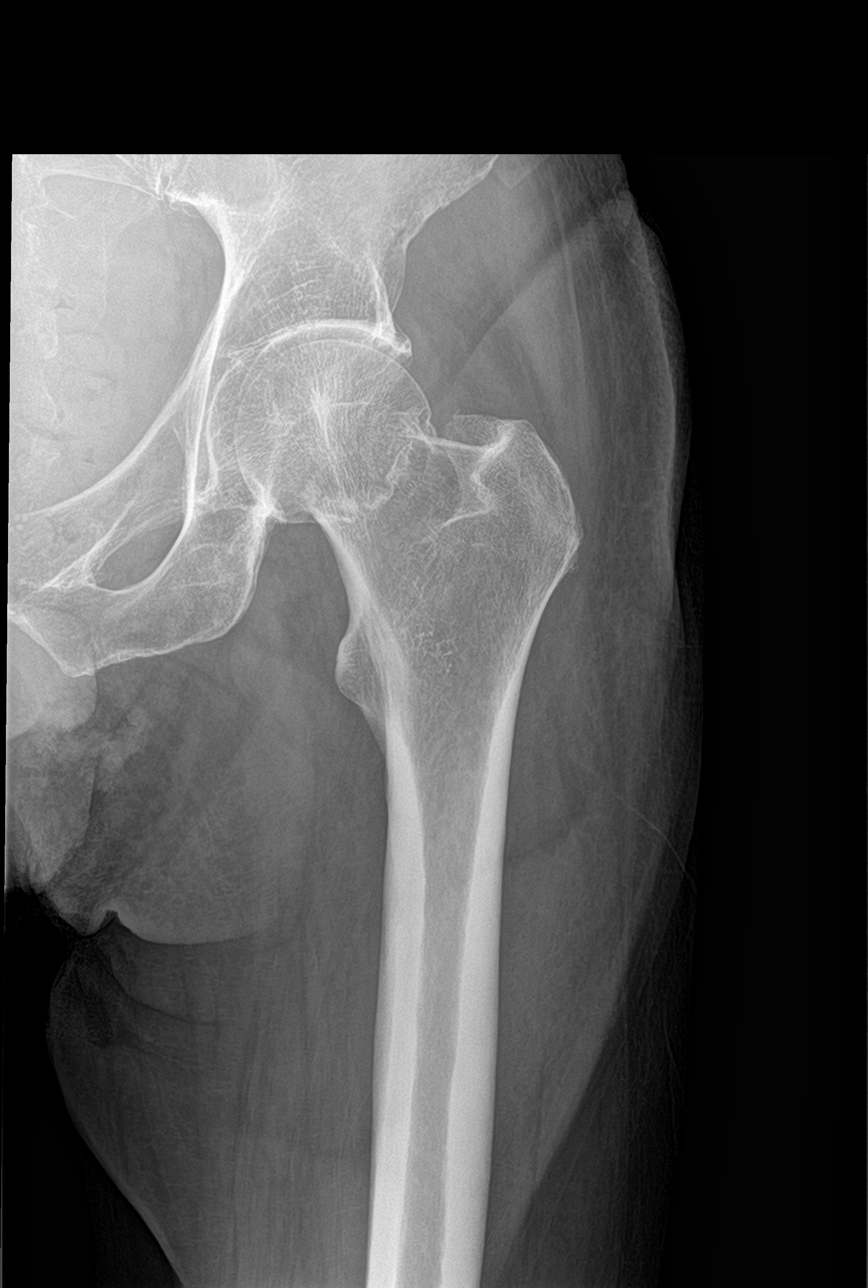

[femur ap (2 of 2)]
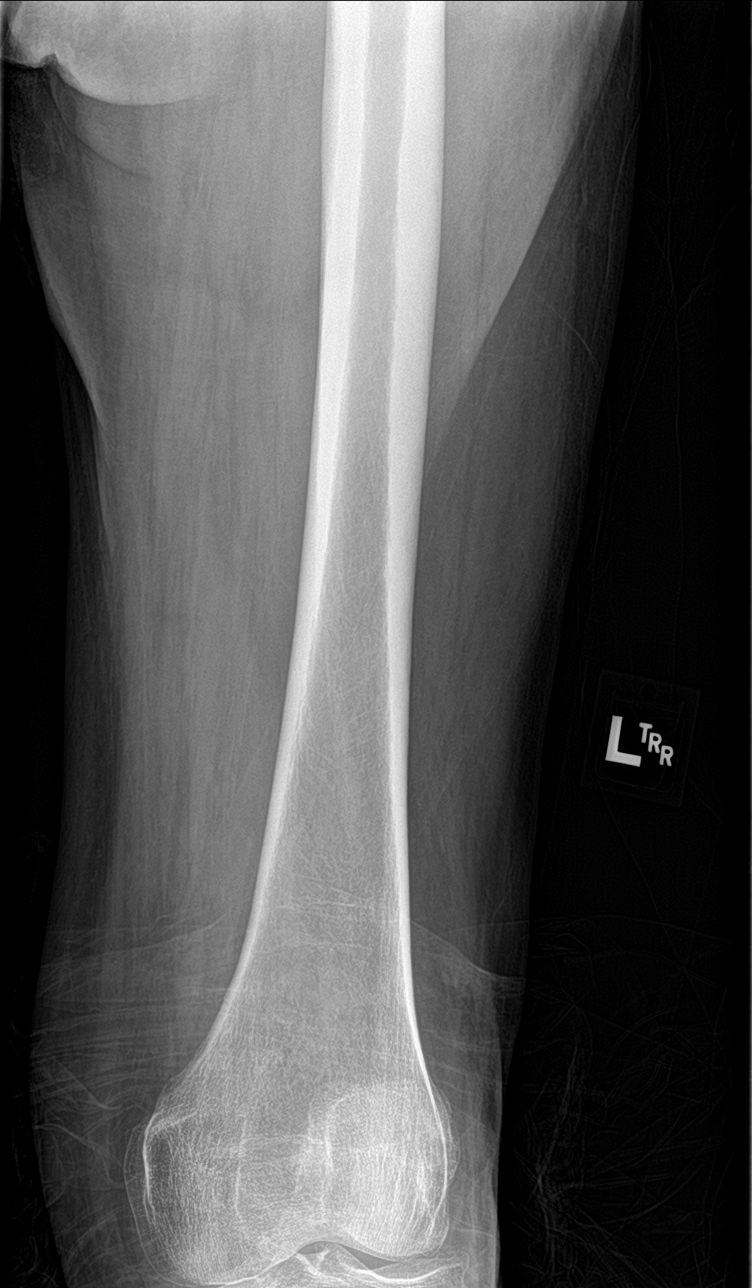

[femur lat (1 of 2)]
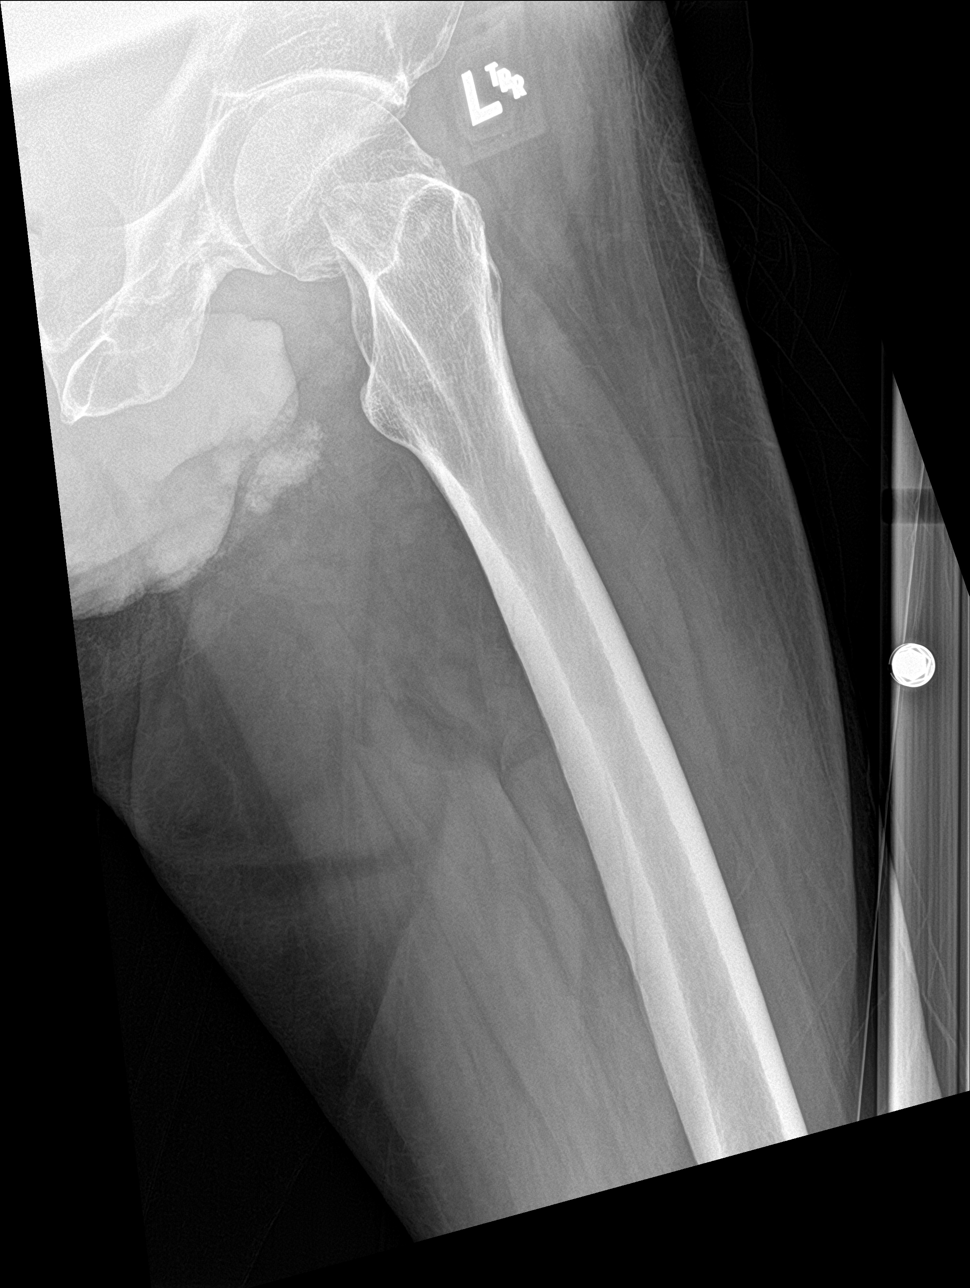

[femur lat (2 of 2)]
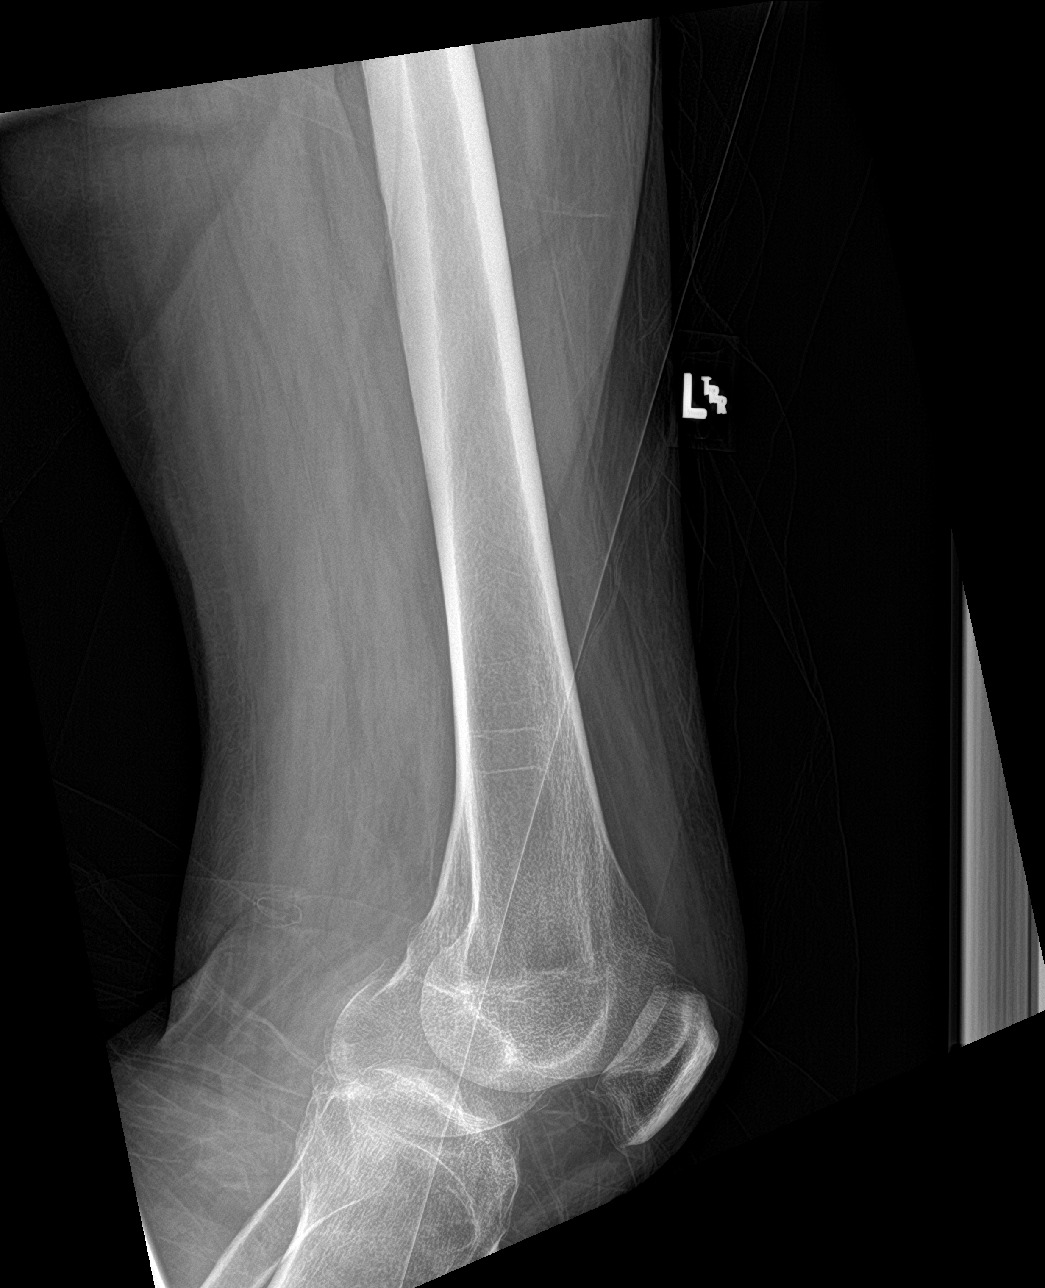

[4 of 4 positions shown; findings below may reference images not displayed]

FINDINGS: Left femoral head is normally located. Visible left pelvis appears
intact. There is a mildly impacted fracture of the left femoral
neck. The femoral head and intertrochanteric segment remain intact.

Left femoral shaft and distal femur are intact. Grossly normal
alignment at the left knee.
IMPRESSION: 1. Mildly impacted left femoral neck fracture.
2. No other acute fracture or dislocation identified about the left
femur.

## 2021-03-13 IMAGING — XA OPERATIVE LEFT HIP WITH PELVIS
2 series · 2 of 2 positions shown · non-contrast
Comparison: Single-view of the pelvis 10/18/2018.

CLINICAL DATA: Intraoperative imaging for fixation of a left hip
fracture the patient suffered a fall 10/18/2018. Initial encounter.

EXAM:
OPERATIVE LEFT HIP (WITH PELVIS IF PERFORMED) 2 VIEWS
TECHNIQUE: Fluoroscopic spot image(s) were submitted for interpretation
post-operatively.

[Series 9: cont. · 1 of 1 slices shown (1 of 2)]
[im 1/1]
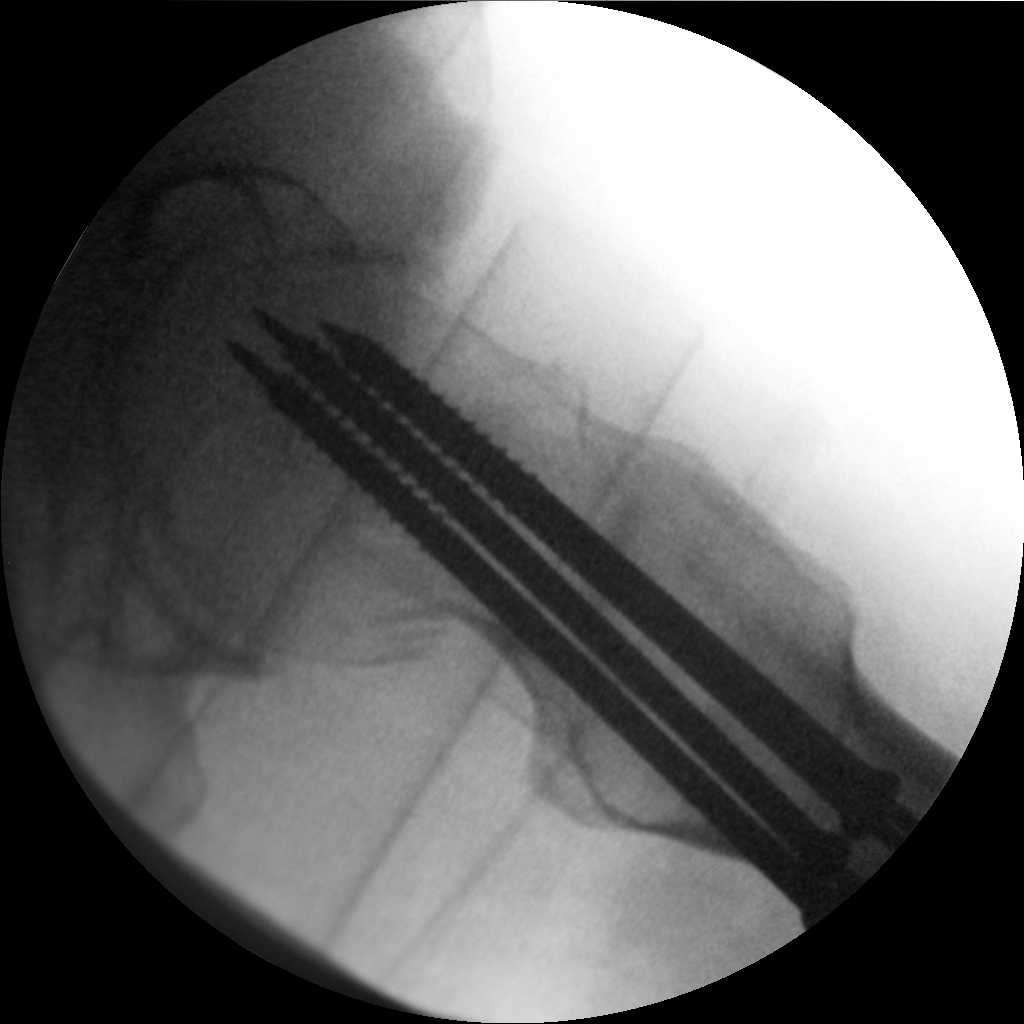

[Series 10: cont. · 1 of 1 slices shown (2 of 2)]
[im 1/1]
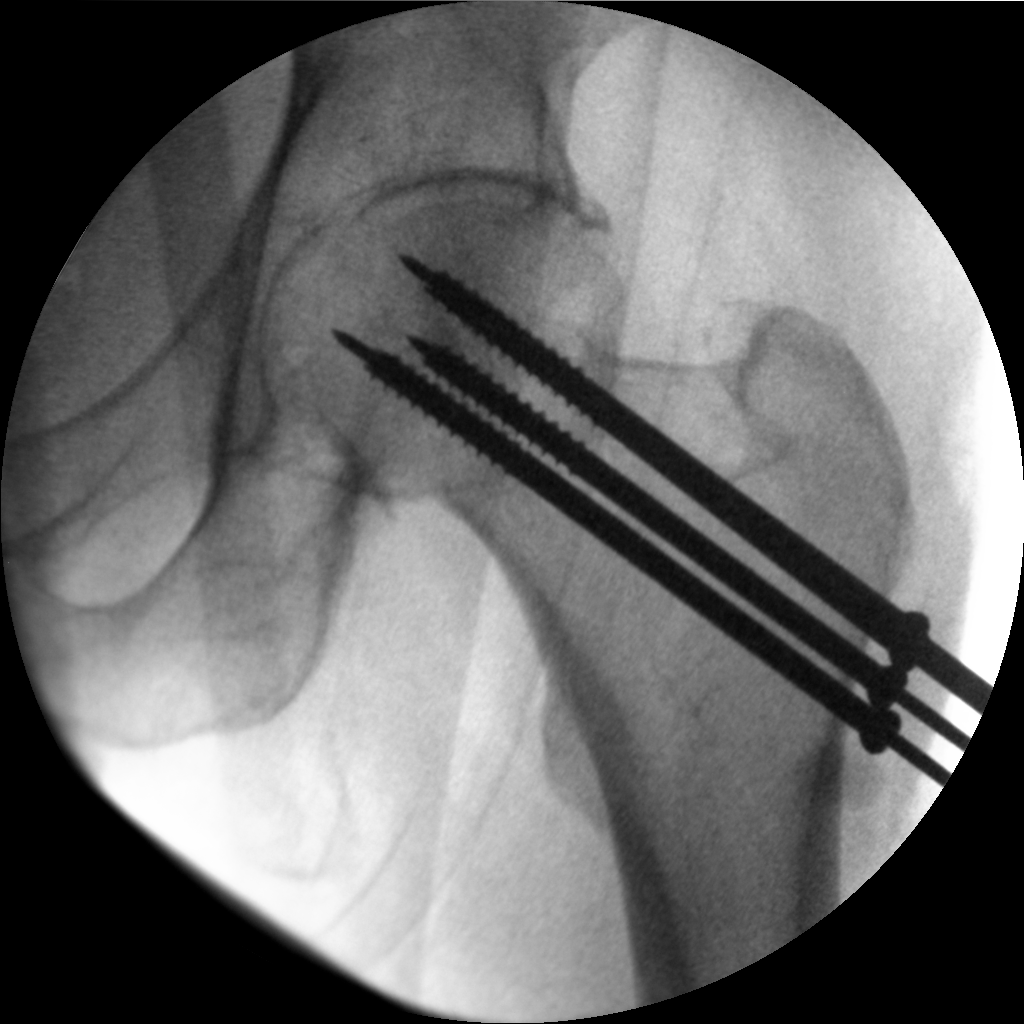

[2 of 2 positions shown; findings below may reference images not displayed]

FINDINGS: Two fluoroscopic intraoperative spot views of the left hip
demonstrate 3 screws in place for fixation of a subcapital fracture.
Hardware is intact. Position and alignment are anatomic. No acute
finding.
IMPRESSION: Intraoperative imaging for fixation of a left hip fracture. No acute
finding.
